# Patient Record
Sex: Male | Born: 1945 | Race: Black or African American | Hispanic: No | Marital: Married | Smoking: Former smoker
Health system: Southern US, Community
[De-identification: ages and names within clinical notes are randomized; demographics above are authoritative.]

## PROBLEM LIST (undated history)

## (undated) DIAGNOSIS — J189 Pneumonia, unspecified organism: Secondary | ICD-10-CM

## (undated) DIAGNOSIS — I719 Aortic aneurysm of unspecified site, without rupture: Secondary | ICD-10-CM

## (undated) DIAGNOSIS — K2971 Gastritis, unspecified, with bleeding: Secondary | ICD-10-CM

## (undated) DIAGNOSIS — I10 Essential (primary) hypertension: Secondary | ICD-10-CM

## (undated) DIAGNOSIS — K2991 Gastroduodenitis, unspecified, with bleeding: Secondary | ICD-10-CM

## (undated) DIAGNOSIS — I2510 Atherosclerotic heart disease of native coronary artery without angina pectoris: Secondary | ICD-10-CM

## (undated) DIAGNOSIS — Z9081 Acquired absence of spleen: Secondary | ICD-10-CM

## (undated) DIAGNOSIS — I4891 Unspecified atrial fibrillation: Secondary | ICD-10-CM

## (undated) DIAGNOSIS — Z8679 Personal history of other diseases of the circulatory system: Secondary | ICD-10-CM

## (undated) DIAGNOSIS — D126 Benign neoplasm of colon, unspecified: Secondary | ICD-10-CM

## (undated) DIAGNOSIS — I82629 Acute embolism and thrombosis of deep veins of unspecified upper extremity: Secondary | ICD-10-CM

## (undated) DIAGNOSIS — Z952 Presence of prosthetic heart valve: Secondary | ICD-10-CM

## (undated) DIAGNOSIS — D62 Acute posthemorrhagic anemia: Secondary | ICD-10-CM

## (undated) DIAGNOSIS — Z9889 Other specified postprocedural states: Secondary | ICD-10-CM

## (undated) HISTORY — PX: SPLENECTOMY: SUR1306

## (undated) HISTORY — DX: Aortic aneurysm of unspecified site, without rupture: I71.9

## (undated) HISTORY — DX: Pneumonia, unspecified organism: J18.9

## (undated) HISTORY — DX: Presence of prosthetic heart valve: Z95.2

## (undated) HISTORY — PX: CARDIAC VALVE REPLACEMENT: SHX585

## (undated) HISTORY — DX: Acquired absence of spleen: Z90.81

## (undated) HISTORY — DX: Essential (primary) hypertension: I10

## (undated) SURGERY — Surgical Case
Anesthesia: *Unknown

---

## 1991-01-27 DIAGNOSIS — I719 Aortic aneurysm of unspecified site, without rupture: Secondary | ICD-10-CM

## 1991-01-27 HISTORY — DX: Aortic aneurysm of unspecified site, without rupture: I71.9

## 1991-01-27 HISTORY — PX: ABDOMINAL AORTIC ANEURYSM REPAIR: SUR1152

## 1991-12-01 ENCOUNTER — Encounter: Payer: Self-pay | Admitting: Cardiology

## 1992-01-27 DIAGNOSIS — Z9081 Acquired absence of spleen: Secondary | ICD-10-CM

## 1992-01-27 HISTORY — DX: Acquired absence of spleen: Z90.81

## 2007-09-13 ENCOUNTER — Encounter: Payer: Self-pay | Admitting: Cardiology

## 2007-09-14 ENCOUNTER — Ambulatory Visit: Payer: Self-pay | Admitting: Cardiology

## 2007-09-14 ENCOUNTER — Encounter: Payer: Self-pay | Admitting: Cardiology

## 2008-03-15 ENCOUNTER — Emergency Department (HOSPITAL_COMMUNITY): Admission: EM | Admit: 2008-03-15 | Discharge: 2008-03-15 | Payer: Self-pay | Admitting: Emergency Medicine

## 2009-06-30 ENCOUNTER — Encounter: Payer: Self-pay | Admitting: Cardiology

## 2009-11-27 ENCOUNTER — Encounter: Payer: Self-pay | Admitting: Cardiology

## 2010-01-13 ENCOUNTER — Encounter: Payer: Self-pay | Admitting: Cardiology

## 2010-01-13 ENCOUNTER — Ambulatory Visit: Payer: Self-pay | Admitting: Cardiology

## 2010-01-13 ENCOUNTER — Encounter (INDEPENDENT_AMBULATORY_CARE_PROVIDER_SITE_OTHER): Payer: Self-pay | Admitting: *Deleted

## 2010-01-13 DIAGNOSIS — R0789 Other chest pain: Secondary | ICD-10-CM

## 2010-01-13 DIAGNOSIS — I712 Thoracic aortic aneurysm, without rupture, unspecified: Secondary | ICD-10-CM | POA: Insufficient documentation

## 2010-01-13 DIAGNOSIS — Z9089 Acquired absence of other organs: Secondary | ICD-10-CM | POA: Insufficient documentation

## 2010-01-13 DIAGNOSIS — I359 Nonrheumatic aortic valve disorder, unspecified: Secondary | ICD-10-CM | POA: Insufficient documentation

## 2010-02-06 ENCOUNTER — Telehealth (INDEPENDENT_AMBULATORY_CARE_PROVIDER_SITE_OTHER): Payer: Self-pay | Admitting: *Deleted

## 2010-02-27 NOTE — Progress Notes (Signed)
Summary: follow up call on patient assistance  Phone Note Outgoing Call   Summary of Call:  Seen on 12/19 - needs to have Echo & Dobutamine Stress Echo, but is self-pay.  Give pt financial assistance application for [employer].  Waiting on pt to bring back to office.   Follow up call placed to patient.  States he brought papers back to office around 12/23.  Advised pt to call office when he receives letter of approval.  Patient verbalized understanding.  Initial call taken by: Hoover Brunette, LPN,  February 06, 2010 3:03 PM

## 2010-02-27 NOTE — Assessment & Plan Note (Signed)
Summary: NP-CARDIOLOGY CONSULT DUE TO S/P   Visit Type:  Initial Consult Primary [name]:  [name]   History of Present Illness: the patient is a 65 year old male with a history of aortic valve replacement and aneurysm repair of the thoracic aorta by Dr. Particia [name] in 1993. Unfortunately have no records available. The patient tells me that he hadpig valve. However physical examination appears in the middle of the mechanical valve. He currently denies any chest pain or shortness of breath. 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  He stable from a cardiovascular standpoint and he denies orthopnea PND palpitations or syncope. The patient has an umbilical hernia which needs her care. He seen for preoperative care. The patient has had no followup since his original surgery many years ago.  Preventive Screening-Counseling & Management  Alcohol-Tobacco     Smoking Status: current     Smoking Cessation Counseling: yes     Packs/Day: <1/3 Pack every 2 weeks  Current Medications (verified): 1)  Amlodipine Besylate 5 Mg Tabs (Amlodipine Besylate) .... Take 1 Tablet By Mouth Once A Day 2)  Benicar Hct 20-12.5 Mg Tabs (Olmesartan Medoxomil-Hctz) .... Take 1 Tablet By Mouth Once A Day 3)  Aspir-Low 81 Mg Tbec (Aspirin) .... Take 1 Tablet By Mouth Once A Day 4)  Lipitor 20 Mg Tabs (Atorvastatin Calcium) .... Take 1 Tablet By Mouth Once A Day 5)  Aspirin 325 Mg Tabs (Aspirin) .... Take 1 Tablet By Mouth Once A Day  Allergies (verified): No Known Drug Allergies  Comments:  Nurse/Medical Assistant: The patient's medication bottles and allergies were reviewed with the patient and were updated in the Medication and Allergy Lists.  Past History:  Past Surgical History: Last updated: 01/13/2010 Aortic aneurysm repair @ [place] 1993 Splenectomy 1994 Heart Valve replacement @ [facility]  Family History: Last updated: 01/13/2010 Father: 57 yrs  Hypertension Mother 22 yrs Diabetes mother deceased Twin brothers are living Health unknown 4 sisters living health unknown  Social History: Last updated: 01/13/2010 Retired  Married  Tobacco Use - Yes.  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    Vital Signs:  Patient profile:   65 year old male Height:      69 inches Weight:      236 pounds BMI:     34.98 Pulse rate:   85 / minute BP sitting:   138 / 84  (left arm) Cuff size:   large  Vitals Entered By: [name] (January 13, 2010 10:30 AM)  Nutrition Counseling: Patient's BMI is greater than 25 and therefore counseled on weight management options.  Physical Exam  Additional Exam:  General: Well-developed, well-nourished in no distress head: Normocephalic and atraumatic eyes PERRLA/EOMI  intact, conjunctiva and lids normal nose: No deformity or lesions mouth normal dentition, normal posterior pharynx neck: Supple, no JVD.  No masses, thyromegaly or abnormal cervical nodes lungs: Normal breath sounds  bilaterally without wheezing.  Normal percussion heart:it sounds the patient actually may have a mechanical valve.with a closing click of S1., no S3 or S4.  PMI is normal.  No pathological murmurs abdomen: Normal bowel sounds, abdomen is soft and nontender without masses, organomegaly or hernias noted.  No hepatosplenomegaly musculoskeletal: Back normal, normal gait muscle strength and tone normal pulsus: Pulse is normal in all 4 extremities Extremities: No peripheral pitting edema neurologic: Alert and oriented x 3 skin: Intact without lesions or rashes cervical nodes: No significant adenopathy psychologic: Normal affect    Impression & Recommendations:  Problem # 1:  AORTIC VALVE DISORDERS (ICD-424.1) the patient status post aortic valve replacement. I suspect he may have a mechanical valve. I'm unable to retrieve the records in the chart. We will try to obtain the original operative report. Nevertheless the patient is an echocardiogram. His updated medication list for this problem includes:    Benicar Hct 20-12.5 Mg Tabs (Olmesartan medoxomil-hctz) .Marland Kitchen... Take 1 tablet by mouth once a day  Orders: 2-D Echocardiogram (2D Echo) Echo- Dobutamine (Dobutamine Echo)  Problem # 2:  PREOPERATIVE EXAMINATION (ICD-V72.84) patient needs a stress echocardiogram prior to umbilical surgery. He has not had an ischemia evaluation many years.  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 Evaluate with echocardiogram.  Other Orders: EKG w/ Interpretation (93000)  Patient Instructions: 1)  2D-Echo 2)  Stress Echo 3)  Follow up in  6 months

## 2010-02-27 NOTE — Letter (Signed)
Summary: Internal Other/ PATIENT HISTORY FORM  Internal Other/ PATIENT HISTORY FORM   Imported By: Dorise Hiss 01/16/2010 15:12:29  _____________________________________________________________________  External Attachment:    Type:   Image     Comment:   External Document

## 2010-02-27 NOTE — Letter (Signed)
Summary: Dobutamine Echocardiogram  [facility] [address]   [place], [place] [address]   Phone: [phone]  Fax: [phone]      [facility] Cardiovascular Services  Dobutamine Echocardiogram    [name]  Appointment Date:_  Appointment Time:_   Your doctor has ordered a Dobutamine echo to help determine the condition of our heart. If you take blood pressure medication, ask your doctor if you should take your medications the day of the procedure. Do not eat and drink 4 hours before the test is scheduled.  You will be asked to undress from the waist up and given a hospital gown to wear, so dress comfortaly from the waist down.  You will need to register at the Outpatient Department at [facility], located at the front enttrance of the hospital. Make sure to bring your insurance information and a form of ID.  Your appointment will last approximately one and one-half hours

## 2010-02-27 NOTE — Op Note (Signed)
Summary: Operative Report/ [facility]  Operative Report/ [place]   Imported By: Dorise Hiss 01/15/2010 09:42:49  _____________________________________________________________________  External Attachment:    Type:   Image     Comment:   External Document

## 2010-02-27 NOTE — Letter (Signed)
Summary: [facility]ence[facility]  [facility]ence[facility]   Imported By: Dorise Hiss 12/11/2009 10:50:51  _____________________________________________________________________  External Attachment:    Type:   Image     Comment:   External Document

## 2010-02-27 NOTE — Letter (Signed)
Summary: External Correspondence/ [facility]ED [facility]  External Correspondence/ FAXED [facility]   Imported By: Dorise Hiss 01/29/2010 10:12:38  _____________________________________________________________________  External Attachment:    Type:   Image     Comment:   External Document

## 2010-07-09 ENCOUNTER — Encounter: Payer: Self-pay | Admitting: Cardiology

## 2010-08-07 ENCOUNTER — Encounter: Payer: Self-pay | Admitting: Cardiology

## 2010-08-13 ENCOUNTER — Ambulatory Visit: Payer: Self-pay | Admitting: Cardiology

## 2012-08-26 HISTORY — PX: ESOPHAGOGASTRODUODENOSCOPY: SHX1529

## 2012-11-30 ENCOUNTER — Encounter (INDEPENDENT_AMBULATORY_CARE_PROVIDER_SITE_OTHER): Payer: Self-pay

## 2012-11-30 ENCOUNTER — Encounter: Payer: Self-pay | Admitting: Gastroenterology

## 2012-11-30 ENCOUNTER — Ambulatory Visit (INDEPENDENT_AMBULATORY_CARE_PROVIDER_SITE_OTHER): Payer: Medicare PPO | Admitting: Gastroenterology

## 2012-11-30 VITALS — BP 133/76 | HR 99 | Temp 97.4°F | Wt 223.8 lb

## 2012-11-30 DIAGNOSIS — Z1211 Encounter for screening for malignant neoplasm of colon: Secondary | ICD-10-CM | POA: Insufficient documentation

## 2012-11-30 DIAGNOSIS — K269 Duodenal ulcer, unspecified as acute or chronic, without hemorrhage or perforation: Secondary | ICD-10-CM

## 2012-11-30 MED ORDER — PEG 3350-KCL-NA BICARB-NACL 420 G PO SOLR: 4000.0000 mL | ORAL | Status: DC

## 2012-11-30 NOTE — Patient Instructions (Signed)
We have set you up for a colonoscopy with Dr. Darrick Penna in the near future.  Continue to take Protonix twice a day through December, then you may resume it once daily. I have sent refills.  Instructions for fatty liver: Recommend 1-2# weight loss per week until ideal body weight through exercise & diet. Low fat/cholesterol diet.   Avoid sweets, sodas, fruit juices, sweetened beverages like tea, etc. Gradually increase exercise from 15 min daily up to 1 hr per day 5 days/week. Limit alcohol use.

## 2012-11-30 NOTE — Progress Notes (Signed)
Primary Care Physician:  Evlyn Courier, MD Primary Gastroenterologist:  Dr. Darrick Penna   Chief Complaint  Patient presents with  . Abnormal Lab    HPI:   [name] presents today at the request of Dr. Loleta Chance to establish care. He was actually hospitalized at [facility] in Aug 2014 with an upper GI bleed secondary to a large duodenal ulcer with visible vessel, s/p clip placement and epi. +H.pylori serology, treated with Biaxin and Amoxicillin. No melena, hematochezia, finished triple therapy for +H.pylori serology. No abdominal pain. No constipation, diarrhea. Urinary urgency at times. No prior colonoscopy. No upper GI symptoms. NO LONGER TAKING NSAIDS.   Appears discharge Hgb from [facility] was 7.6 in early Sept 2014. Outside labs from Dr. Loleta Chance reveal Hgb 7.4 in mid October. Unknown baseline, iron, ferritin levels.   Past Medical History  Diagnosis Date  . Chest pain   . Hypertension   . Aortic aneurysm 1993  . 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 Pneumonia   . Aortic valve replaced      At [facility]    Past Surgical History  Procedure Laterality Date  . Abdominal aortic aneurysm repair  1993    At [facility], Unspecified  . Splenectomy    . Cardiac valve replacement      At [facility]  . Esophagogastroduodenoscopy  Aug 2014    [facility]: large duodenal ulcer with visible vessel, s/p clip and epi, erosive gastritis. +h.pylori serology, treated with amoxicillin and biaxin.     Current Outpatient Prescriptions  Medication Sig Dispense Refill  . folic acid (FOLVITE) 1 MG tablet Take 1 mg by mouth daily.      . pantoprazole (PROTONIX) 40 MG tablet Take 40 mg by mouth 2 (two) times daily.        No current facility-administered medications for this visit.    Allergies as of 11/30/2012  . (No Known Allergies)    Family History  Problem Relation Age of Onset  . Hypertension Father 22  . Diabetes Mother 37  . Colon cancer Neg Hx     History    Social History  . Marital Status: Married    Spouse Name: N/A    Number of Children: 3  . Years of Education: N/A   Occupational History  . Retired    Social History Main Topics  . Smoking status: Former Games developer  . Smokeless tobacco: Not on file  . Alcohol Use: No  . Drug Use: No  . Sexual Activity: Not on file   Other Topics Concern  . Not on file   Social History Narrative   Patient has two daughters and one son in good health    Review of Systems: As mentioned in HPI.   Physical Exam: BP 133/76  Pulse 99  Temp(Src) 97.4 F (36.3 C) (Oral)  Wt 223 lb 12.8 oz (101.515 kg) General:   Alert and oriented. Pleasant and cooperative. Well-nourished and well-developed.  Head:  Normocephalic and atraumatic. Eyes:  Without icterus, sclera clear and conjunctiva pink.  Ears:  Normal auditory acuity. Nose:  No deformity, discharge,  or lesions. Mouth:  No deformity or lesions, oral mucosa pink.  Neck:  Supple, without mass or thyromegaly. Lungs:  Clear to auscultation bilaterally. No wheezes, rales, or rhonchi. No distress.  Heart:  S1, S2 present with murmur consistent with prior aortic valve placement Abdomen:  +BS, soft, non-tender and non-distended. Large ventral hernia appreciated, no significant HSM appreciated Rectal:  Deferred  Msk:  Symmetrical without gross deformities. Normal posture. Extremities:  Without clubbing or edema. Neurologic:  Alert and  oriented x4;  grossly normal neurologically. Skin:  Intact without significant lesions or rashes. Cervical Nodes:  No significant cervical adenopathy. Psych:  Alert and cooperative. Normal mood and affect.  [place] of abdomen Aug 2014 at [facility]:  Fatty liver

## 2012-12-01 NOTE — Assessment & Plan Note (Signed)
In the setting of NSAIDs, requiring bleeding control therapy at [facility]. Continue Protonix BID for a complete 3 months, then back to once daily indefinitely. H.pylori serologies have been positive and treated with triple therapy. No surveillance indicated unless worsening anemia or evidence of melena.  Obtain CBC, iron, ferritin now.

## 2012-12-01 NOTE — Assessment & Plan Note (Signed)
67 year old male with no prior colonoscopy, overdue for initial screening. No rectal bleeding, change in bowel habits, or constipation noted. Aug 2014 hospitalized for bleeding duodenal ulcer at [facility]; +h.pylori serologies treated with triple therapy. NO LONGER ON NSAIDS. Patient doing well currently. Unknown baseline Hgb, but most recent Hgb 7.4 mid October.   Obtain updated CBC, iron, ferritin Proceed with colonoscopy with Dr. Darrick Penna in the near future. The risks, benefits, and alternatives have been discussed in detail with the patient. They state understanding and desire to proceed.

## 2012-12-05 NOTE — Progress Notes (Signed)
cc'd to pcp 

## 2012-12-07 NOTE — Progress Notes (Signed)
Need outside labs from PCP to include LFTs.   Patient needs to get updated CBC, iron, and ferritin. Thanks!

## 2012-12-08 LAB — CBC: HGB: 7.4 g/dL

## 2012-12-09 ENCOUNTER — Other Ambulatory Visit: Payer: Self-pay

## 2012-12-09 DIAGNOSIS — D582 Other hemoglobinopathies: Secondary | ICD-10-CM

## 2012-12-13 NOTE — Progress Notes (Signed)
Called and informed pt. He is aware lab orders have been faxed to [facility]. Routing to SS to get labs from PCP.

## 2012-12-14 ENCOUNTER — Encounter (HOSPITAL_COMMUNITY): Payer: Self-pay | Admitting: Pharmacy Technician

## 2012-12-14 NOTE — Progress Notes (Signed)
Requested labs including LFTS from Dr Loleta Chance

## 2012-12-16 LAB — CBC WITH DIFFERENTIAL/PLATELET
Basophils Absolute: 0 10*3/uL (ref 0.0–0.1)
Basophils Relative: 1 % (ref 0–1)
Eosinophils Absolute: 0.2 10*3/uL (ref 0.0–0.7)
Eosinophils Relative: 3 % (ref 0–5)
HCT: 27.6 % — ABNORMAL LOW (ref 39.0–52.0)
Lymphocytes Relative: 27 % (ref 12–46)
Lymphs Abs: 1.7 10*3/uL (ref 0.7–4.0)
MCH: 22.2 pg — ABNORMAL LOW (ref 26.0–34.0)
MCHC: 30.1 g/dL (ref 30.0–36.0)
MCV: 73.8 fL — ABNORMAL LOW (ref 78.0–100.0)
Monocytes Absolute: 0.9 10*3/uL (ref 0.1–1.0)
Neutrophils Relative %: 54 % (ref 43–77)
RBC: 3.74 MIL/uL — ABNORMAL LOW (ref 4.22–5.81)
RDW: 20 % — ABNORMAL HIGH (ref 11.5–15.5)

## 2012-12-16 LAB — IRON: Iron: 13 ug/dL — ABNORMAL LOW (ref 42–165)

## 2012-12-26 DIAGNOSIS — D126 Benign neoplasm of colon, unspecified: Secondary | ICD-10-CM

## 2012-12-26 HISTORY — DX: Benign neoplasm of colon, unspecified: D12.6

## 2012-12-27 ENCOUNTER — Encounter (HOSPITAL_COMMUNITY)
Admission: RE | Disposition: A | Discharge status: Home / Self Care | Payer: Self-pay | Source: Ambulatory Visit | Attending: Gastroenterology

## 2012-12-27 ENCOUNTER — Ambulatory Visit (HOSPITAL_COMMUNITY)
Admission: RE | Admit: 2012-12-27 | Discharge: 2012-12-27 | Disposition: A | Discharge status: Home / Self Care | Payer: Medicare PPO | Source: Ambulatory Visit | Attending: Gastroenterology | Admitting: Gastroenterology

## 2012-12-27 ENCOUNTER — Encounter (HOSPITAL_COMMUNITY): Payer: Self-pay | Admitting: *Deleted

## 2012-12-27 DIAGNOSIS — K644 Residual hemorrhoidal skin tags: Secondary | ICD-10-CM

## 2012-12-27 DIAGNOSIS — I10 Essential (primary) hypertension: Secondary | ICD-10-CM | POA: Insufficient documentation

## 2012-12-27 DIAGNOSIS — D126 Benign neoplasm of colon, unspecified: Secondary | ICD-10-CM

## 2012-12-27 DIAGNOSIS — Z1211 Encounter for screening for malignant neoplasm of colon: Secondary | ICD-10-CM

## 2012-12-27 DIAGNOSIS — K5730 Diverticulosis of large intestine without perforation or abscess without bleeding: Secondary | ICD-10-CM | POA: Insufficient documentation

## 2012-12-27 HISTORY — PX: COLONOSCOPY: SHX5424

## 2012-12-27 SURGERY — COLONOSCOPY
Anesthesia: Moderate Sedation

## 2012-12-27 MED ORDER — STERILE WATER FOR IRRIGATION IR SOLN
Status: DC | PRN
  Administered 2012-12-27: 08:00:00

## 2012-12-27 MED ORDER — SODIUM CHLORIDE 0.9 % IV SOLN
INTRAVENOUS | Status: DC
  Administered 2012-12-27: 08:00:00 via INTRAVENOUS

## 2012-12-27 MED ORDER — MIDAZOLAM HCL 5 MG/5ML IJ SOLN
INTRAMUSCULAR | Status: DC | PRN
  Administered 2012-12-27 (×2): 2 mg via INTRAVENOUS
  Administered 2012-12-27: 1 mg via INTRAVENOUS

## 2012-12-27 MED ORDER — MIDAZOLAM HCL 5 MG/5ML IJ SOLN
INTRAMUSCULAR | Status: AC
  Filled 2012-12-27: qty 10

## 2012-12-27 MED ORDER — MEPERIDINE HCL 100 MG/ML IJ SOLN
INTRAMUSCULAR | Status: AC
  Filled 2012-12-27: qty 2

## 2012-12-27 MED ORDER — MEPERIDINE HCL 100 MG/ML IJ SOLN
INTRAMUSCULAR | Status: DC | PRN
  Administered 2012-12-27 (×2): 25 mg via INTRAVENOUS

## 2012-12-27 NOTE — Op Note (Signed)
[facility] [address] [place], [address]   COLONOSCOPY PROCEDURE REPORT  PATIENT: Cody, [name]  MR#: [id number] BIRTHDATE: [birth year] , 67  yrs. old GENDER: Male ENDOSCOPIST: Cody Eva, MD REFERRED QI:ONGEXB [name], [name] PROCEDURE DATE:  12/27/2012 PROCEDURE:   Colonoscopy with snare polypectomy INDICATIONS:Average risk patient for colon cancer. MEDICATIONS: Demerol 50 mg IV and Versed 5 mg IV  DESCRIPTION OF PROCEDURE:    Physical exam was performed.  Informed consent was obtained from the patient after explaining the benefits, risks, and alternatives to procedure.  The patient was connected to monitor and placed in left lateral position. Continuous oxygen was provided by nasal cannula and IV medicine administered through an indwelling cannula.  After administration of sedation and rectal exam, the patients rectum was intubated and the EC-[id number] ([id number])  colonoscope was advanced under direct visualization to the ileum.  The scope was removed slowly by carefully examining the color, texture, anatomy, and integrity mucosa on the way out.  The patient was recovered in endoscopy and discharged home in satisfactory condition.    COLON FINDINGS: The mucosa appeared normal in the terminal ileum.  , A sessile polyp measuring 8 mm in size was found at the cecum.  A polypectomy was performed using snare cautery.  , The DC/[place] colon ARE redundant.  Manual abdominal counter-pressure was used to reach the cecum.  The patient was moved on to their back to reach the cecum, There was mild diverticulosis noted in the descending colon and sigmoid colon with associated tortuosity.  , and Moderate sized external hemorrhoids were found.  PREP QUALITY: good.  CECAL W/D TIME: 15 minutes     COMPLICATIONS: None  ENDOSCOPIC IMPRESSION: 1.   ONE COLONPOLYP REMOVED 2.   Mild diverticulosis in the descending colon and sigmoid colon 3.   The colon IS redundant 4.    Moderate sized external hemorrhoids  RECOMMENDATIONS: FOLLOW A HIGH FIBER DIET.  AVOID ITEMS THAT CAUSE BLOATING. BIOPSY RESULTS SHOULD BE BACK IN 7 DAYS. Next colonoscopy WITH AN OVERTUBE in 3 YEARS IF SERRATED OR ADVANCED ADENOMA AND 5-10 YEARS IF A SIMPLE ADENOMA.       _______________________________ Rosalie DoctorJonette Eva, MD 12/27/2012 9:20 AM

## 2012-12-27 NOTE — H&P (Signed)
  Primary Care Physician:  Evlyn Courier, MD Primary Gastroenterologist:  Dr. Darrick Penna  Pre-Procedure History & Physical: HPI:  [name] is a 67 y.o. male here for COLON CANCER SCREENING.  Past Medical History  Diagnosis Date  . Chest pain   . Hypertension   . Aortic aneurysm 1993  . 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 Pneumonia   . Aortic valve replaced      At [facility]    Past Surgical History  Procedure Laterality Date  . Abdominal aortic aneurysm repair  1993    At [facility], Unspecified  . Splenectomy    . Cardiac valve replacement      At [facility]  . Esophagogastroduodenoscopy  Aug 2014    [facility]: large duodenal ulcer with visible vessel, s/p clip and epi, erosive gastritis. +h.pylori serology, treated with amoxicillin and biaxin.     Prior to Admission medications   Medication Sig Start Date End Date Taking? Authorizing Provider  folic acid (FOLVITE) 1 MG tablet Take 1 mg by mouth daily.   Yes Historical Provider, MD  olmesartan-hydrochlorothiazide (BENICAR HCT) 20-12.5 MG per tablet Take 1 tablet by mouth daily.   Yes Historical Provider, MD    Allergies as of 11/30/2012  . (No Known Allergies)    Family History  Problem Relation Age of Onset  . Hypertension Father 26  . Diabetes Mother 40  . Colon cancer Neg Hx     History   Social History  . Marital Status: Married    Spouse Name: N/A    Number of Children: 3  . Years of Education: N/A   Occupational History  . Retired    Social History Main Topics  . Smoking status: Former Games developer  . Smokeless tobacco: Not on file  . Alcohol Use: No  . Drug Use: No  . Sexual Activity: Not on file   Other Topics Concern  . Not on file   Social History Narrative   Patient has two daughters and one son in good health    Review of Systems: See HPI, otherwise negative ROS   Physical Exam: BP 138/80  Pulse 81  Temp(Src) 98.1 F (36.7 C) (Oral)  Resp 18  Ht 5\' 9"   (1.753 m)  Wt 223 lb (101.152 kg)  BMI 32.92 kg/m2  SpO2 98% General:   Alert,  pleasant and cooperative in NAD Head:  Normocephalic and atraumatic. Neck:  Supple; Lungs:  Clear throughout to auscultation.    Heart:  Regular rate and rhythm. Abdomen:  Soft, nontender and nondistended. Normal bowel sounds, without guarding, and without rebound.   Neurologic:  Alert and  oriented x4;  grossly normal neurologically.  Impression/Plan:     SCREENING  Plan:  1. TCS TODAY

## 2012-12-28 NOTE — Progress Notes (Signed)
Quick Note:  Hgb improved but still low. Likely due to known PUD.  Start iron 325 mg po BID. Can get this over the counter.  Recheck CBC, iron, and ferritin in 3 months ______

## 2012-12-30 ENCOUNTER — Encounter (HOSPITAL_COMMUNITY): Payer: Self-pay | Admitting: Gastroenterology

## 2013-01-02 ENCOUNTER — Other Ambulatory Visit: Payer: Self-pay

## 2013-01-02 DIAGNOSIS — R7989 Other specified abnormal findings of blood chemistry: Secondary | ICD-10-CM

## 2013-01-02 DIAGNOSIS — R790 Abnormal level of blood mineral: Secondary | ICD-10-CM

## 2013-01-02 NOTE — Progress Notes (Signed)
Quick Note:  Called and left message for a return call. Lab order on file for 03/2013. ______

## 2013-01-03 ENCOUNTER — Telehealth: Payer: Self-pay | Admitting: Gastroenterology

## 2013-01-03 NOTE — Telephone Encounter (Signed)
Called. Many rings and no answer.  

## 2013-01-03 NOTE — Progress Notes (Signed)
Quick Note:  LM with someone at home for a return call. ______

## 2013-01-03 NOTE — Telephone Encounter (Signed)
Left message with someone for a return call.  

## 2013-01-03 NOTE — Telephone Encounter (Signed)
Please call pt. He had A simple adenoma removed from hIS colon. FOLLOW A High fiber diet. TCS IN 5 YEARS.  

## 2013-01-04 NOTE — Telephone Encounter (Signed)
Pt called and was informed of results.  

## 2013-01-04 NOTE — Progress Notes (Signed)
Quick Note:  PT returned call and was informed. Lab orders on file. ______

## 2013-03-16 ENCOUNTER — Other Ambulatory Visit: Payer: Self-pay

## 2013-03-16 DIAGNOSIS — R7989 Other specified abnormal findings of blood chemistry: Secondary | ICD-10-CM

## 2013-03-16 DIAGNOSIS — R790 Abnormal level of blood mineral: Secondary | ICD-10-CM

## 2013-03-27 LAB — CBC WITH DIFFERENTIAL/PLATELET
Basophils Absolute: 0.1 10*3/uL (ref 0.0–0.1)
Basophils Relative: 1 % (ref 0–1)
EOS PCT: 3 % (ref 0–5)
Eosinophils Absolute: 0.2 10*3/uL (ref 0.0–0.7)
HCT: 40.8 % (ref 39.0–52.0)
HEMOGLOBIN: 13.1 g/dL (ref 13.0–17.0)
LYMPHS ABS: 1.4 10*3/uL (ref 0.7–4.0)
LYMPHS PCT: 23 % (ref 12–46)
MCH: 28.5 pg (ref 26.0–34.0)
MCHC: 32.1 g/dL (ref 30.0–36.0)
MCV: 88.7 fL (ref 78.0–100.0)
MONOS PCT: 15 % — AB (ref 3–12)
Monocytes Absolute: 0.9 10*3/uL (ref 0.1–1.0)
Neutro Abs: 3.5 10*3/uL (ref 1.7–7.7)
Neutrophils Relative %: 58 % (ref 43–77)
Platelets: 224 10*3/uL (ref 150–400)
RBC: 4.6 MIL/uL (ref 4.22–5.81)
RDW: 18.2 % — ABNORMAL HIGH (ref 11.5–15.5)
WBC: 6.1 10*3/uL (ref 4.0–10.5)

## 2013-03-27 LAB — IRON: IRON: 92 ug/dL (ref 42–165)

## 2013-03-28 LAB — FERRITIN: Ferritin: 36 ng/mL (ref 22–322)

## 2013-04-17 NOTE — Progress Notes (Signed)
Quick Note:  Hgb now normal. Iron, ferritin improved and normal. Continue iron just once daily.  Recheck CBC, iron, ferritin in 6 months. ______

## 2013-04-18 ENCOUNTER — Other Ambulatory Visit: Payer: Self-pay

## 2013-04-18 DIAGNOSIS — R790 Abnormal level of blood mineral: Secondary | ICD-10-CM

## 2013-04-18 NOTE — Progress Notes (Signed)
Quick Note:  Pt is aware and lab orders on file for 10/19/2013. ______

## 2013-09-07 ENCOUNTER — Other Ambulatory Visit: Payer: Self-pay

## 2013-09-07 DIAGNOSIS — R790 Abnormal level of blood mineral: Secondary | ICD-10-CM

## 2014-05-27 DIAGNOSIS — K2971 Gastritis, unspecified, with bleeding: Secondary | ICD-10-CM

## 2014-05-27 DIAGNOSIS — I82629 Acute embolism and thrombosis of deep veins of unspecified upper extremity: Secondary | ICD-10-CM

## 2014-05-27 DIAGNOSIS — I4891 Unspecified atrial fibrillation: Secondary | ICD-10-CM

## 2014-05-27 DIAGNOSIS — D62 Acute posthemorrhagic anemia: Secondary | ICD-10-CM

## 2014-05-27 HISTORY — DX: Gastritis, unspecified, with bleeding: K29.71

## 2014-05-27 HISTORY — DX: Unspecified atrial fibrillation: I48.91

## 2014-05-27 HISTORY — DX: Acute embolism and thrombosis of deep veins of unspecified upper extremity: I82.629

## 2014-05-27 HISTORY — DX: Acute posthemorrhagic anemia: D62

## 2014-06-14 ENCOUNTER — Inpatient Hospital Stay (HOSPITAL_COMMUNITY): Payer: Commercial Managed Care - HMO

## 2014-06-14 ENCOUNTER — Inpatient Hospital Stay (HOSPITAL_COMMUNITY)
Admission: RE | Admit: 2014-06-14 | Discharge: 2014-06-29 | DRG: 981 | Disposition: A | Discharge status: Rehabilitation Facility | Payer: Commercial Managed Care - HMO | Source: Other Acute Inpatient Hospital | Attending: Cardiology | Admitting: Cardiology

## 2014-06-14 ENCOUNTER — Encounter (HOSPITAL_COMMUNITY): Payer: Self-pay | Admitting: Cardiology

## 2014-06-14 DIAGNOSIS — I482 Chronic atrial fibrillation: Secondary | ICD-10-CM | POA: Diagnosis present

## 2014-06-14 DIAGNOSIS — T82867A Thrombosis of cardiac prosthetic devices, implants and grafts, initial encounter: Secondary | ICD-10-CM | POA: Diagnosis present

## 2014-06-14 DIAGNOSIS — Z9081 Acquired absence of spleen: Secondary | ICD-10-CM | POA: Diagnosis present

## 2014-06-14 DIAGNOSIS — R7989 Other specified abnormal findings of blood chemistry: Secondary | ICD-10-CM | POA: Diagnosis present

## 2014-06-14 DIAGNOSIS — M7989 Other specified soft tissue disorders: Secondary | ICD-10-CM | POA: Diagnosis not present

## 2014-06-14 DIAGNOSIS — R570 Cardiogenic shock: Secondary | ICD-10-CM | POA: Diagnosis present

## 2014-06-14 DIAGNOSIS — R0602 Shortness of breath: Secondary | ICD-10-CM | POA: Diagnosis present

## 2014-06-14 DIAGNOSIS — I82612 Acute embolism and thrombosis of superficial veins of left upper extremity: Secondary | ICD-10-CM | POA: Diagnosis not present

## 2014-06-14 DIAGNOSIS — I4891 Unspecified atrial fibrillation: Secondary | ICD-10-CM | POA: Diagnosis not present

## 2014-06-14 DIAGNOSIS — I350 Nonrheumatic aortic (valve) stenosis: Secondary | ICD-10-CM | POA: Diagnosis not present

## 2014-06-14 DIAGNOSIS — Y838 Other surgical procedures as the cause of abnormal reaction of the patient, or of later complication, without mention of misadventure at the time of the procedure: Secondary | ICD-10-CM | POA: Diagnosis present

## 2014-06-14 DIAGNOSIS — K259 Gastric ulcer, unspecified as acute or chronic, without hemorrhage or perforation: Secondary | ICD-10-CM | POA: Insufficient documentation

## 2014-06-14 DIAGNOSIS — J9600 Acute respiratory failure, unspecified whether with hypoxia or hypercapnia: Secondary | ICD-10-CM | POA: Diagnosis not present

## 2014-06-14 DIAGNOSIS — K2961 Other gastritis with bleeding: Secondary | ICD-10-CM | POA: Diagnosis not present

## 2014-06-14 DIAGNOSIS — R791 Abnormal coagulation profile: Secondary | ICD-10-CM

## 2014-06-14 DIAGNOSIS — I351 Nonrheumatic aortic (valve) insufficiency: Secondary | ICD-10-CM | POA: Diagnosis present

## 2014-06-14 DIAGNOSIS — E876 Hypokalemia: Secondary | ICD-10-CM | POA: Diagnosis not present

## 2014-06-14 DIAGNOSIS — Z87891 Personal history of nicotine dependence: Secondary | ICD-10-CM

## 2014-06-14 DIAGNOSIS — I5033 Acute on chronic diastolic (congestive) heart failure: Secondary | ICD-10-CM | POA: Diagnosis present

## 2014-06-14 DIAGNOSIS — R4182 Altered mental status, unspecified: Secondary | ICD-10-CM | POA: Diagnosis not present

## 2014-06-14 DIAGNOSIS — K2980 Duodenitis without bleeding: Secondary | ICD-10-CM | POA: Diagnosis present

## 2014-06-14 DIAGNOSIS — D62 Acute posthemorrhagic anemia: Secondary | ICD-10-CM | POA: Diagnosis present

## 2014-06-14 DIAGNOSIS — D696 Thrombocytopenia, unspecified: Secondary | ICD-10-CM | POA: Diagnosis not present

## 2014-06-14 DIAGNOSIS — N179 Acute kidney failure, unspecified: Secondary | ICD-10-CM | POA: Diagnosis not present

## 2014-06-14 DIAGNOSIS — I82622 Acute embolism and thrombosis of deep veins of left upper extremity: Secondary | ICD-10-CM | POA: Diagnosis not present

## 2014-06-14 DIAGNOSIS — Z8679 Personal history of other diseases of the circulatory system: Secondary | ICD-10-CM

## 2014-06-14 DIAGNOSIS — I481 Persistent atrial fibrillation: Secondary | ICD-10-CM | POA: Diagnosis not present

## 2014-06-14 DIAGNOSIS — E871 Hypo-osmolality and hyponatremia: Secondary | ICD-10-CM | POA: Diagnosis present

## 2014-06-14 DIAGNOSIS — K921 Melena: Secondary | ICD-10-CM | POA: Insufficient documentation

## 2014-06-14 DIAGNOSIS — K047 Periapical abscess without sinus: Secondary | ICD-10-CM | POA: Diagnosis present

## 2014-06-14 DIAGNOSIS — I10 Essential (primary) hypertension: Secondary | ICD-10-CM | POA: Insufficient documentation

## 2014-06-14 DIAGNOSIS — M1000 Idiopathic gout, unspecified site: Secondary | ICD-10-CM | POA: Insufficient documentation

## 2014-06-14 DIAGNOSIS — Q213 Tetralogy of Fallot: Secondary | ICD-10-CM | POA: Diagnosis not present

## 2014-06-14 DIAGNOSIS — I509 Heart failure, unspecified: Secondary | ICD-10-CM | POA: Diagnosis not present

## 2014-06-14 DIAGNOSIS — K254 Chronic or unspecified gastric ulcer with hemorrhage: Secondary | ICD-10-CM | POA: Insufficient documentation

## 2014-06-14 DIAGNOSIS — Z9889 Other specified postprocedural states: Secondary | ICD-10-CM

## 2014-06-14 DIAGNOSIS — Z9114 Patient's other noncompliance with medication regimen: Secondary | ICD-10-CM | POA: Diagnosis present

## 2014-06-14 DIAGNOSIS — K253 Acute gastric ulcer without hemorrhage or perforation: Secondary | ICD-10-CM | POA: Diagnosis not present

## 2014-06-14 DIAGNOSIS — G7281 Critical illness myopathy: Secondary | ICD-10-CM | POA: Diagnosis not present

## 2014-06-14 DIAGNOSIS — Z452 Encounter for adjustment and management of vascular access device: Secondary | ICD-10-CM

## 2014-06-14 DIAGNOSIS — R531 Weakness: Secondary | ICD-10-CM | POA: Diagnosis not present

## 2014-06-14 DIAGNOSIS — R5381 Other malaise: Secondary | ICD-10-CM | POA: Diagnosis not present

## 2014-06-14 DIAGNOSIS — I2510 Atherosclerotic heart disease of native coronary artery without angina pectoris: Secondary | ICD-10-CM | POA: Diagnosis not present

## 2014-06-14 DIAGNOSIS — I69319 Unspecified symptoms and signs involving cognitive functions following cerebral infarction: Secondary | ICD-10-CM

## 2014-06-14 DIAGNOSIS — R778 Other specified abnormalities of plasma proteins: Secondary | ICD-10-CM | POA: Diagnosis present

## 2014-06-14 DIAGNOSIS — I480 Paroxysmal atrial fibrillation: Secondary | ICD-10-CM | POA: Diagnosis not present

## 2014-06-14 DIAGNOSIS — Z9289 Personal history of other medical treatment: Secondary | ICD-10-CM

## 2014-06-14 HISTORY — DX: Atherosclerotic heart disease of native coronary artery without angina pectoris: I25.10

## 2014-06-14 HISTORY — DX: Acute posthemorrhagic anemia: D62

## 2014-06-14 HISTORY — DX: Other specified postprocedural states: Z98.890

## 2014-06-14 HISTORY — DX: Gastritis, unspecified, with bleeding: K29.71

## 2014-06-14 HISTORY — DX: Acute embolism and thrombosis of deep veins of unspecified upper extremity: I82.629

## 2014-06-14 HISTORY — DX: Unspecified atrial fibrillation: I48.91

## 2014-06-14 HISTORY — DX: Gastroduodenitis, unspecified, with bleeding: K29.91

## 2014-06-14 HISTORY — DX: Personal history of other diseases of the circulatory system: Z86.79

## 2014-06-14 HISTORY — DX: Benign neoplasm of colon, unspecified: D12.6

## 2014-06-14 LAB — CBC
HCT: 34.8 % — ABNORMAL LOW (ref 39.0–52.0)
HEMOGLOBIN: 12.3 g/dL — AB (ref 13.0–17.0)
MCH: 33.2 pg (ref 26.0–34.0)
MCHC: 35.3 g/dL (ref 30.0–36.0)
MCV: 93.8 fL (ref 78.0–100.0)
PLATELETS: 122 10*3/uL — AB (ref 150–400)
RBC: 3.71 MIL/uL — AB (ref 4.22–5.81)
RDW: 14.1 % (ref 11.5–15.5)
WBC: 9 10*3/uL (ref 4.0–10.5)

## 2014-06-14 LAB — TROPONIN I: TROPONIN I: 1.28 ng/mL — AB (ref <0.031)

## 2014-06-14 LAB — BASIC METABOLIC PANEL
ANION GAP: 11 (ref 5–15)
BUN: 25 mg/dL — ABNORMAL HIGH (ref 6–20)
CO2: 25 mmol/L (ref 22–32)
Calcium: 9.4 mg/dL (ref 8.9–10.3)
Chloride: 93 mmol/L — ABNORMAL LOW (ref 101–111)
Creatinine, Ser: 1.6 mg/dL — ABNORMAL HIGH (ref 0.61–1.24)
GFR, EST AFRICAN AMERICAN: 50 mL/min — AB (ref >60)
GFR, EST NON AFRICAN AMERICAN: 43 mL/min — AB (ref >60)
Glucose, Bld: 105 mg/dL — ABNORMAL HIGH (ref 65–99)
POTASSIUM: 4.4 mmol/L (ref 3.5–5.1)
SODIUM: 129 mmol/L — AB (ref 135–145)

## 2014-06-14 LAB — LACTIC ACID, PLASMA: Lactic Acid, Venous: 2.3 mmol/L (ref 0.5–2.0)

## 2014-06-14 LAB — BRAIN NATRIURETIC PEPTIDE: B Natriuretic Peptide: 1438.6 pg/mL — ABNORMAL HIGH (ref 0.0–100.0)

## 2014-06-14 LAB — RAPID URINE DRUG SCREEN, HOSP PERFORMED
Amphetamines: NOT DETECTED
BARBITURATES: NOT DETECTED
Benzodiazepines: POSITIVE — AB
Cocaine: NOT DETECTED
OPIATES: NOT DETECTED
TETRAHYDROCANNABINOL: NOT DETECTED

## 2014-06-14 LAB — MRSA PCR SCREENING: MRSA BY PCR: NEGATIVE

## 2014-06-14 MED ORDER — PERFLUTREN LIPID MICROSPHERE
INTRAVENOUS | Status: AC
Start: 2014-06-14 — End: 2014-06-15
  Filled 2014-06-14: qty 10

## 2014-06-14 MED ORDER — DOPAMINE-DEXTROSE 3.2-5 MG/ML-% IV SOLN
3.0000 ug/kg/min | INTRAVENOUS | Status: DC
  Administered 2014-06-14: 3 ug/kg/min via INTRAVENOUS

## 2014-06-14 MED ORDER — PNEUMOCOCCAL VAC POLYVALENT 25 MCG/0.5ML IJ INJ
0.5000 mL | INJECTION | INTRAMUSCULAR | Status: DC
  Filled 2014-06-14: qty 0.5

## 2014-06-14 MED ORDER — HEPARIN SODIUM (PORCINE) 5000 UNIT/ML IJ SOLN: 5000.0000 [IU] | Freq: Three times a day (TID) | INTRAMUSCULAR | Status: DC

## 2014-06-14 MED ORDER — HEPARIN BOLUS VIA INFUSION
2500.0000 [IU] | Freq: Once | INTRAVENOUS | Status: AC
  Administered 2014-06-14: 2500 [IU] via INTRAVENOUS
  Filled 2014-06-14: qty 2500

## 2014-06-14 MED ORDER — SODIUM CHLORIDE 0.9 % IV SOLN: 250.0000 mL | INTRAVENOUS | Status: DC | PRN

## 2014-06-14 MED ORDER — PERFLUTREN LIPID MICROSPHERE
1.0000 mL | INTRAVENOUS | Status: AC | PRN
  Administered 2014-06-14: 1 mL via INTRAVENOUS
  Filled 2014-06-14: qty 10

## 2014-06-14 MED ORDER — DOPAMINE-DEXTROSE 3.2-5 MG/ML-% IV SOLN
3.0000 ug/kg/min | INTRAVENOUS | Status: DC
  Administered 2014-06-14: 2 ug/kg/min via INTRAVENOUS

## 2014-06-14 MED ORDER — HEPARIN (PORCINE) IN NACL 100-0.45 UNIT/ML-% IJ SOLN
1050.0000 [IU]/h | INTRAMUSCULAR | Status: DC
  Administered 2014-06-14: 1500 [IU]/h via INTRAVENOUS
  Administered 2014-06-15: 1300 [IU]/h via INTRAVENOUS
  Administered 2014-06-16: 1050 [IU]/h via INTRAVENOUS
  Filled 2014-06-14 (×5): qty 250

## 2014-06-14 NOTE — Progress Notes (Signed)
CRITICAL VALUE ALERT  Critical value received:  Lactic acid 2.3  Date of notification:  06/14/2014  Time of notification:  2215  Critical value read back:Yes.    Nurse who received alert:  Lesli Albee, RN  MD notified (1st page):  Eula Fried  Time of first page:  2217

## 2014-06-14 NOTE — H&P (Signed)
PULMONARY / CRITICAL CARE MEDICINE   Name: [name] MRN: [id number] DOB: 04-14-1945    ADMISSION DATE:  06/14/2014 CONSULTATION DATE:  06/14/2014  REFERRING MD :  Outside hospital  CHIEF COMPLAINT:  Shortness of breath  INITIAL PRESENTATION: 69 year old male with a past medical history significant for aortic valve repair in the setting of an aortic aneurysm in the 1990s by Dr. [name] at [facility] came to [facility] on 06/13/2014 complaining of shortness of breath. He was found to have severe aortic valve insufficiency and so he was transferred to [facility] for further evaluation.  STUDIES:    SIGNIFICANT EVENTS:    HISTORY OF PRESENT ILLNESS:  69 year old male with a past medical history significant for aortic valve repair in the setting of an aortic aneurysm in the 1990s by Dr. [name] at [facility] came to [facility] on 06/13/2014 complaining of shortness of breath. He was found to have severe aortic valve insufficiency and so he was transferred to [facility] for further evaluation. He states that the shortness of breath came on fairly insidiously and was associated with a dry cough. He denied chest pain or leg swelling or fevers. He denied cough productive of purulent sputum. He had a transesophageal echocardiogram which showed evidence of a preserved LVEF but apparently severe aortic regurgitation. Dopamine was used at the outside hospital presumably for low blood pressure. His creatinine was found to be 1.4. The patient states that he has not been a doctor in many years and takes "a blood pressure medication". When asked about whether or not he takes blood thinners for his aortic valve he said "I used to". Because of the use of dopamine and the elevated creatinine pulmonary and critical care medicine was asked to admit the patient.  PAST MEDICAL HISTORY :   has a past medical history of Chest pain; Hypertension;  Aortic aneurysm (1993); History of splenectomy (1994); Pneumonia; and Aortic valve replaced ( ).  has past surgical history that includes Abdominal aortic aneurysm repair (1993); Splenectomy; Cardiac valve replacement; Esophagogastroduodenoscopy (Aug 2014); and Colonoscopy (N/A, 12/27/2012). Prior to Admission medications   Medication Sig Start Date End Date Taking? Authorizing Provider  folic acid (FOLVITE) 1 MG tablet Take 1 mg by mouth daily.    Historical Provider, MD  olmesartan-hydrochlorothiazide (BENICAR HCT) 20-12.5 MG per tablet Take 1 tablet by mouth daily.    Historical Provider, MD   No Known Allergies  FAMILY HISTORY:  indicated that his mother is deceased.  SOCIAL HISTORY:  reports that he has quit smoking. He does not have any smokeless tobacco history on file. He reports that he does not drink alcohol or use illicit drugs.  REVIEW OF SYSTEMS:   Gen: Denies fever, chills, weight change, fatigue, night sweats HEENT: Denies blurred vision, double vision, hearing loss, tinnitus, sinus congestion, rhinorrhea, sore throat, neck stiffness, dysphagia PULM: Denies shortness of breath, cough, sputum production, hemoptysis, wheezing CV: Per history of present illness GI: Denies abdominal pain, nausea, vomiting, diarrhea, hematochezia, melena, constipation, change in bowel habits GU: Denies dysuria, hematuria, polyuria, oliguria, urethral discharge Endocrine: Denies hot or cold intolerance, polyuria, polyphagia or appetite change Derm: Denies rash, dry skin, scaling or peeling skin change Heme: Denies easy bruising, bleeding, bleeding gums Neuro: Denies headache, numbness, weakness, slurred speech, loss of memory or consciousness   SUBJECTIVE:   VITAL SIGNS: Temp:  [98 F (36.7 C)] 98 F (36.7 C) (05/19 1845) Pulse Rate:  [83-90] 90 (  05/19 1915) Resp:  [16-19] 16 (05/19 1915) BP: (99-113)/(56-70) 101/66 mmHg (05/19 1915) SpO2:  [99 %] 99 % (05/19 1915) HEMODYNAMICS:    VENTILATOR SETTINGS:   INTAKE / OUTPUT: No intake or output data in the 24 hours ending 06/14/14 1926  PHYSICAL EXAMINATION: General:  Chronically ill-appearing, no acute distress Neuro:  Alert and oriented 4, moves all 4 extremities well HEENT:  NCAT, pupils equal round reactive to light, oropharynx clear Cardiovascular:  Regular rate and rhythm systolic murmur noted Lungs:  Clear to auscultation bilaterally Abdomen:  Bowel sounds positive, midline ventral hernia noted, easily reduced Musculoskeletal:  Normal bulk and tone Skin:  No rash or skin breakdown  LABS:  CBC No results for input(s): WBC, HGB, HCT, PLT in the last 168 hours. Coag's No results for input(s): APTT, INR in the last 168 hours. BMET No results for input(s): NA, K, CL, CO2, BUN, CREATININE, GLUCOSE in the last 168 hours. Electrolytes No results for input(s): CALCIUM, MG, PHOS in the last 168 hours. Sepsis Markers No results for input(s): LATICACIDVEN, PROCALCITON, O2SATVEN in the last 168 hours. ABG No results for input(s): PHART, PCO2ART, PO2ART in the last 168 hours. Liver Enzymes No results for input(s): AST, ALT, ALKPHOS, BILITOT, ALBUMIN in the last 168 hours. Cardiac Enzymes No results for input(s): TROPONINI, PROBNP in the last 168 hours. Glucose No results for input(s): GLUCAP in the last 168 hours.  Imaging No results found.   ASSESSMENT / PLAN:  PULMONARY OETT not indicated A: No acute issues P:   Monitor O2 saturation  CARDIOVASCULAR A: Possible cardiogenic shock at outside hospital, appears resolved now as blood pressure normal on minimal dopamine Aortic valve insufficiency, unclear if this is acute or chronic, preserved LVEF noted on echocardiogram at outside hospital P:  Wean off dopamine Stat cardiology consult, further imaging and workup per cardiology  RENAL A:  Elevated serum creatinine, uncertain baseline kidney function P:   Repeat be met now Monitor urine  output  GASTROINTESTINAL A:  No acute issues P:   Heart healthy diet  HEMATOLOGIC A:  No acute issues P:  Monitor for bleeding, check CBC  INFECTIOUS A:  No acute issues P:   Monitor for fever  ENDOCRINE A:  No acute issues   P:   Monitor glucose  NEUROLOGIC A: Urine drug screen noted to be positive for cocaine outside hospital P:   Check urine drug screen    TODAY'S SUMMARY: Aortic valve insufficiency of uncertain duration, also with kidney insufficiency of uncertain duration and acuity. Apparently he was in cardiogenic shock but currently his blood pressure is elevated on a minimal dose of dopamine. We will stop the dopamine and ask cardiology to assume care.   Roselie Awkward, MD [place] [facility] Pager: [phone] Cell: [phone] If no response, call [phone]   06/14/2014, 7:26 PM

## 2014-06-14 NOTE — Consult Note (Addendum)
Reason for Consult: for prosthetic valve failure   Referring Physician: Dr. Lake Bells    PCP:  [name], MD  Primary Cardiologist: New- saw [facility] in the past  [name] is an 69 y.o. male.    Chief Complaint: Pt went to [facility] for 4 days or increased SOB.     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  ( 25 mm St Jude valve conduit)  in the setting of an aortic aneurysm and AI in the 1993 by Dr. [name] at [facility].  ( His cath at that time revealed normal coronary arteries.)   He went to [facility] on 06/13/2014 complaining of shortness of breath.  He had been SOB and swelling for 4 days, He was found to have severe aortic valve insufficiency and so he was transferred to [facility] for further evaluation.  TEE was done but do not have record only valve failure.  He had not been seen by PCP for a year and no meds for 3 months.  He was given diuretic and pt felt better but today he became hypotensive.  He is on dopamine.     Currently pt without pain, some SOB and BP is low. On IV Dopamine.  EKG a fib with rt ward axis, non specific T wave abnormality similar to 2011 EKG though now a fib.  Labs at [facility] Na 126, K+4.8, T Bili 1.4, AST 52  Troponin T 0.59  CKMB 10.3  Pro bnp 13,873  D-dimer 6.48, TSH 2.50 Mag 2.2       Past Medical History  Diagnosis Date  . Chest pain   . Hypertension   . Aortic aneurysm 1993  . 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 Pneumonia   . Aortic valve replaced      At [facility]  . Hx of aortic aneurysm repair 06/14/2014    Past Surgical History  Procedure Laterality Date  . Abdominal aortic aneurysm repair  1993    At [facility], Unspecified  . Splenectomy    . Cardiac valve replacement      At [facility]  . Esophagogastroduodenoscopy  Aug 2014    [facility]: large duodenal ulcer with  visible vessel, s/p clip and epi, erosive gastritis. +h.pylori serology, treated with amoxicillin and biaxin.   . Colonoscopy N/A 12/27/2012    Procedure: COLONOSCOPY;  Surgeon: Danie Binder, MD;  Location: [facility];  Service: Endoscopy;  Laterality: N/A;  8:45-moved to 79     Family History  Problem Relation Age of Onset  . Hypertension Father 62  . Diabetes Mother 71  . Colon cancer Neg Hx    Social History:  reports that he has quit smoking. He does not have any smokeless tobacco history on file. He reports that he does not drink alcohol or use illicit drugs.  Allergies: No Known Allergies  OUTPATIENT MEDS No current facility-administered medications on file prior to encounter.   Current Outpatient Prescriptions on File Prior to Encounter  Medication Sig Dispense Refill  . olmesartan-hydrochlorothiazide (BENICAR HCT) 20-12.5 MG per tablet Take 1 tablet by mouth 2 (two) times a week.      Current MEDS Scheduled Meds: . heparin  5,000 Units Subcutaneous 3 times per day  . [START ON 06/15/2014] pneumococcal 23 valent vaccine  0.5 mL Intramuscular Tomorrow-1000   Continuous  Infusions: . DOPamine 3 mcg/kg/min (06/14/14 2040)   PRN Meds:.sodium chloride  Results for orders placed or performed during the hospital encounter of 06/14/14 (from the past 48 hour(s))  Urine rapid drug screen (hosp performed)     Status: Abnormal   Collection Time: 06/14/14  7:44 PM  Result Value Ref Range   Opiates NONE DETECTED NONE DETECTED   Cocaine NONE DETECTED NONE DETECTED   Benzodiazepines POSITIVE (A) NONE DETECTED   Amphetamines NONE DETECTED NONE DETECTED   Tetrahydrocannabinol NONE DETECTED NONE DETECTED   Barbiturates NONE DETECTED NONE DETECTED    Comment:        DRUG SCREEN FOR MEDICAL PURPOSES ONLY.  IF CONFIRMATION IS NEEDED FOR ANY PURPOSE, NOTIFY LAB WITHIN 5 DAYS.        LOWEST DETECTABLE LIMITS FOR URINE DRUG SCREEN Drug Class       Cutoff (ng/mL) Amphetamine       1000 Barbiturate      200 Benzodiazepine   492 Tricyclics       010 Opiates          300 Cocaine          300 THC              50    Dg Chest Port 1 View  06/14/2014   CLINICAL DATA:  CHF exacerbation, history hypertension and prior AVR  EXAM: PORTABLE CHEST - 1 VIEW  COMPARISON:  Portable exam 2010 hours compared to 06/13/2014  FINDINGS: Enlargement of cardiac silhouette post median sternotomy.  Tortuous aorta.  Mediastinal contours and pulmonary vascularity normal.  Minimal bibasilar atelectasis.  No gross infiltrate, pleural effusion or pneumothorax.  IMPRESSION: Enlargement of cardiac silhouette with minimal bibasilar atelectasis.   Electronically Signed   By: Lavonia Dana [name]   On: 06/14/2014 20:50    ROS: General:no colds or fevers, increase of weight  Skin:no rashes or ulcers HEENT:no blurred vision, no congestion CV:see HPI PUL:see HPI GI:no diarrhea constipation or melena, no indigestion GU:no hematuria, no dysuria MS:no joint pain, no claudication, + edema Neuro:no syncope, no lightheadedness Endo:no diabetes, no thyroid disease   Blood pressure 123/79, pulse 83, temperature 97.9 F (36.6 C), temperature source Oral, resp. rate 18, weight 225 lb 15.5 oz (102.5 kg), SpO2 100 %.  Wt Readings from Last 3 Encounters:  06/14/14 225 lb 15.5 oz (102.5 kg)  12/27/12 223 lb (101.152 kg)  11/30/12 223 lb 12.8 oz (101.515 kg)    PE: per Dr. Meda Coffee  General:Pleasant affect, NAD Skin:Warm and dry, brisk capillary refill HEENT:normocephalic, sclera clear, mucus membranes moist Neck:supple, + JVD to jaw, no bruits  Heart:S1S2 RRR with 3/6  murmur, no gallup, rub or click Lungs:diminished bases with few crackles, no rhonchi, or wheezes OFH:QRFX, non tender, + BS, do not palpate liver spleen or masses Ext:no lower ext edema, 2+ pedal pulses, 2+ radial pulses Neuro:alert and oriented X 3, MAE, follows commands, + facial symmetry    Assessment/Plan Principal Problem:   Aortic  valve insufficiency- with hx of AVR now with valve failure, for Echo and may need fluoroscopy/cardiac cath tomorrow will make NPO after MN, Echo to be done tonight. Have called for TEE from [facility], may need to call tomorrow.  Dr. Meda Coffee has seen and evaluated , currently not significantly wet, will hold diuretic for now  Active Problems:   Cardiogenic shock, possible- on IV dopamine    Hx of aortic aneurysm repair- 1993     Atrial fibrillation, CHA2DS2VASc score 3-  do not know length of time adding IV Heparin    Elevated troponin- serial troponin, with hypotension unable to add BB, IV heparin, check lipids in am   Positive D dimer add IV heparin, may need CTA if not previously done     [facility] R  Nurse Practitioner Certified [place] Pager [phone] or after 5pm or weekends call [phone] 06/14/2014, 9:11 PM    The patient was seen, examined and discussed with Cecilie Kicks, NP and I agree with the above.   69 year old male with h/o aortic root and aortic valve replacement at [facility] in 1993 with 25 mm St Jude valve conduit, who presented to [facility] yesterday with acute CHF presumably sec to valve dysfunction. TTE and TEE performed at [facility], but incomplete records sent. The patient was started on Dopamin drip for hypotension, currently on 5 mcg with BP 90/66 mmHg. He had elevated troponin 0.59, D-dimer at 6.7, BNP 13.000.  On physical exam the patient doesn't appear to be in acute distress, he has JVDs up to the jaws, minimal crackles at lung bases and minimal LE edema. He has significant holosystolic and diastolic murmur.  Bedside echo performed with handheld echo machine shows at least moderate AI, normal LVEF, dilated right ventricle with moderate dysfunction, transaortic gradients cant be assessed.   It is not clear if the patient has mechanical or bioprosthetic valve, on exam it sounds like mechanical, however no significant  reverberation artifact on limited bedside echo and the patient hasn't been on Coumadin.  The patient had an echo done in 2009 - it states that the patient has mechanical and porcine valve (must be mistake), however there was no AI at the time, and increased transaortic gradients peak 54 and mean 32 mmHg.  We will start iv Heparin. We will obtain stat complete echo for further valve evaluation and evaluation of filling pressures and pulmonary hypertension.  The patient appears stable and no intervention needs to be tonight. If mechanical valve with limited motion a fluoroscopy might be considered.  We have requested missing documentation from the [facility] (TTE, TEE results). We will consider V/Q scan based on echo results.The patient is already being started on Coumadin.  The patient is in rate controlled a-fib, ECG nonspecific ST-T wave are unchanged from [address].  I would hold diuretics as he is hypotensive and doesn't appear to be significantly fluid overloaded.   Dorothy Spark 06/14/2014

## 2014-06-14 NOTE — Progress Notes (Signed)
Pt arrived via EMS from [facility] ICU for further evaluation by CCM. Pt AAox4 but slow to answer questions. Strength equal strong in all extremities. Perrla. VSS. NAD no c/o pain. Pt came to unit on 39mcg of dopamine running. Will cont to monitor.

## 2014-06-14 NOTE — Consult Note (Signed)
ANTICOAGULATION CONSULT NOTE - Initial Consult  Pharmacy Consult for Heparin Indication: atrial fibrillation, r/o ACS, r/o PE  No Known Allergies  Patient Measurements: Height: 5' 8.9" (175 cm) Weight: 225 lb 15.5 oz (102.5 kg) IBW/kg (Calculated) : 70.47 Heparin Dosing Weight: 92.4kg  Vital Signs: Temp: 97.9 F (36.6 C) (05/19 2000) Temp Source: Oral (05/19 2000) BP: 123/79 mmHg (05/19 2100) Pulse Rate: 83 (05/19 2100)  Labs: No results for input(s): HGB, HCT, PLT, APTT, LABPROT, INR, HEPARINUNFRC, CREATININE, CKTOTAL, CKMB, TROPONINI in the last 72 hours.  CrCl cannot be calculated (Patient has no serum creatinine result on file.).   Medical History: Past Medical History  Diagnosis Date  . Chest pain   . Hypertension   . Aortic aneurysm 1993  . 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 Pneumonia   . Aortic valve replaced      At [facility]  . Hx of aortic aneurysm repair 06/14/2014   Assessment: 69yom transferred from [facility] with elevated troponin, elevated d-dimer, and afib in the setting of possible cardiogenic shock 2/2 aortic valve insufficiency. He will begin IV heparin for his afib (CHADSVASC =3) as well as possible ACS and PE. Pertinent labs from [facility]: sCr 1.43, INR 1.5, Hgb 12, Plts 125. He also received a dose of lovenox 30mg  yesterday at 1658.  Goal of Therapy:  Heparin level 0.3-0.7 units/ml Monitor platelets by anticoagulation protocol: Yes   Plan:  1) Heparin bolus 2500 units x 1 (smaller due to elevated baseline INR) 2) Heparin drip at 1500 units/hr 3) Check 6 hour heparin level 4) Daily heparin level and CBC  Deboraha Sprang 06/14/2014,9:32 PM

## 2014-06-14 NOTE — Progress Notes (Signed)
CRITICAL VALUE ALERT  Critical value received:  Troponin 1.28  Date of notification:  06/14/2014  Time of notification:  2230  Critical value read back:Yes.    Nurse who received alert:  Lesli Albee, RN  Time of 1st call to [place]  MD notified- Chase Caller, CCM

## 2014-06-15 ENCOUNTER — Inpatient Hospital Stay (HOSPITAL_COMMUNITY): Payer: Commercial Managed Care - HMO

## 2014-06-15 ENCOUNTER — Other Ambulatory Visit: Payer: Self-pay | Admitting: *Deleted

## 2014-06-15 ENCOUNTER — Encounter (HOSPITAL_COMMUNITY): Payer: Self-pay

## 2014-06-15 ENCOUNTER — Encounter (HOSPITAL_COMMUNITY)
Admission: RE | Disposition: A | Discharge status: Rehabilitation Facility | Payer: Medicare PPO | Source: Other Acute Inpatient Hospital | Attending: Cardiology

## 2014-06-15 ENCOUNTER — Encounter (HOSPITAL_COMMUNITY)
Admission: RE | Disposition: A | Discharge status: Rehabilitation Facility | Payer: Self-pay | Source: Other Acute Inpatient Hospital | Attending: Cardiology

## 2014-06-15 DIAGNOSIS — I351 Nonrheumatic aortic (valve) insufficiency: Secondary | ICD-10-CM

## 2014-06-15 HISTORY — PX: CARDIAC CATHETERIZATION: SHX172

## 2014-06-15 HISTORY — PX: TEE WITHOUT CARDIOVERSION: SHX5443

## 2014-06-15 LAB — HEPARIN LEVEL (UNFRACTIONATED)
Heparin Unfractionated: 0.52 IU/mL (ref 0.30–0.70)
Heparin Unfractionated: 0.79 IU/mL — ABNORMAL HIGH (ref 0.30–0.70)
Heparin Unfractionated: 0.86 IU/mL — ABNORMAL HIGH (ref 0.30–0.70)

## 2014-06-15 LAB — BASIC METABOLIC PANEL
Anion gap: 9 (ref 5–15)
BUN: 24 mg/dL — AB (ref 6–20)
CALCIUM: 9.2 mg/dL (ref 8.9–10.3)
CO2: 27 mmol/L (ref 22–32)
Chloride: 93 mmol/L — ABNORMAL LOW (ref 101–111)
Creatinine, Ser: 1.48 mg/dL — ABNORMAL HIGH (ref 0.61–1.24)
GFR calc Af Amer: 55 mL/min — ABNORMAL LOW (ref >60)
GFR calc non Af Amer: 47 mL/min — ABNORMAL LOW (ref >60)
GLUCOSE: 96 mg/dL (ref 65–99)
Potassium: 4.4 mmol/L (ref 3.5–5.1)
SODIUM: 129 mmol/L — AB (ref 135–145)

## 2014-06-15 LAB — POCT I-STAT 3, ART BLOOD GAS (G3+)
Acid-base deficit: 2 mmol/L (ref 0.0–2.0)
Bicarbonate: 23.2 mEq/L (ref 20.0–24.0)
O2 Saturation: 99 %
PCO2 ART: 40.2 mmHg (ref 35.0–45.0)
PH ART: 7.37 (ref 7.350–7.450)
Patient temperature: 98.6
TCO2: 24 mmol/L (ref 0–100)
pO2, Arterial: 123 mmHg — ABNORMAL HIGH (ref 80.0–100.0)

## 2014-06-15 LAB — COMPREHENSIVE METABOLIC PANEL
ALT: 27 U/L (ref 17–63)
AST: 40 U/L (ref 15–41)
Albumin: 3.4 g/dL — ABNORMAL LOW (ref 3.5–5.0)
Alkaline Phosphatase: 51 U/L (ref 38–126)
Anion gap: 12 (ref 5–15)
BUN: 21 mg/dL — ABNORMAL HIGH (ref 6–20)
CO2: 25 mmol/L (ref 22–32)
Calcium: 9 mg/dL (ref 8.9–10.3)
Chloride: 93 mmol/L — ABNORMAL LOW (ref 101–111)
Creatinine, Ser: 1.39 mg/dL — ABNORMAL HIGH (ref 0.61–1.24)
GFR calc Af Amer: 59 mL/min — ABNORMAL LOW (ref >60)
GFR calc non Af Amer: 51 mL/min — ABNORMAL LOW (ref >60)
Glucose, Bld: 90 mg/dL (ref 65–99)
Potassium: 4.4 mmol/L (ref 3.5–5.1)
Sodium: 130 mmol/L — ABNORMAL LOW (ref 135–145)
Total Bilirubin: 1.9 mg/dL — ABNORMAL HIGH (ref 0.3–1.2)
Total Protein: 6.9 g/dL (ref 6.5–8.1)

## 2014-06-15 LAB — CBC
HEMATOCRIT: 34.3 % — AB (ref 39.0–52.0)
Hemoglobin: 11.7 g/dL — ABNORMAL LOW (ref 13.0–17.0)
MCH: 32.3 pg (ref 26.0–34.0)
MCHC: 34.1 g/dL (ref 30.0–36.0)
MCV: 94.8 fL (ref 78.0–100.0)
PLATELETS: 112 10*3/uL — AB (ref 150–400)
RBC: 3.62 MIL/uL — ABNORMAL LOW (ref 4.22–5.81)
RDW: 14.1 % (ref 11.5–15.5)
WBC: 9.3 10*3/uL (ref 4.0–10.5)

## 2014-06-15 LAB — MAGNESIUM: Magnesium: 2.1 mg/dL (ref 1.7–2.4)

## 2014-06-15 LAB — TROPONIN I
TROPONIN I: 1.33 ng/mL — AB (ref <0.031)
Troponin I: 1.09 ng/mL (ref <0.031)

## 2014-06-15 LAB — PROTIME-INR
INR: 1.67 — ABNORMAL HIGH (ref 0.00–1.49)
Prothrombin Time: 19.7 seconds — ABNORMAL HIGH (ref 11.6–15.2)

## 2014-06-15 LAB — ABO/RH: ABO/RH(D): A POS

## 2014-06-15 LAB — SURGICAL PCR SCREEN
MRSA, PCR: NEGATIVE
Staphylococcus aureus: NEGATIVE

## 2014-06-15 SURGERY — ECHOCARDIOGRAM, TRANSESOPHAGEAL
Anesthesia: Moderate Sedation

## 2014-06-15 SURGERY — FLUOROSCOPY GUIDANCE
Anesthesia: LOCAL

## 2014-06-15 MED ORDER — LIDOCAINE VISCOUS 2 % MT SOLN
OROMUCOSAL | Status: AC
  Filled 2014-06-15: qty 15

## 2014-06-15 MED ORDER — ATORVASTATIN CALCIUM 40 MG PO TABS
40.0000 mg | ORAL_TABLET | Freq: Every day | ORAL | Status: DC
  Administered 2014-06-15 – 2014-06-20 (×6): 40 mg via ORAL
  Filled 2014-06-15 (×7): qty 1

## 2014-06-15 MED ORDER — FENTANYL CITRATE (PF) 100 MCG/2ML IJ SOLN
INTRAMUSCULAR | Status: DC | PRN
  Administered 2014-06-15: 25 ug via INTRAVENOUS

## 2014-06-15 MED ORDER — MIDAZOLAM HCL 5 MG/ML IJ SOLN
INTRAMUSCULAR | Status: AC
  Filled 2014-06-15: qty 2

## 2014-06-15 MED ORDER — SODIUM CHLORIDE 0.9 % IV SOLN
2.0000 g | INTRAVENOUS | Status: AC
  Administered 2014-06-15: 2 g via INTRAVENOUS
  Filled 2014-06-15: qty 2000

## 2014-06-15 MED ORDER — EPTIFIBATIDE 75 MG/100ML IV SOLN
1.0000 ug/kg/min | INTRAVENOUS | Status: DC
  Administered 2014-06-15: 2 ug/kg/min via INTRAVENOUS
  Administered 2014-06-16: 1 ug/kg/min via INTRAVENOUS
  Administered 2014-06-16 (×2): 2 ug/kg/min via INTRAVENOUS
  Filled 2014-06-15 (×9): qty 100

## 2014-06-15 MED ORDER — ASPIRIN 81 MG PO CHEW
81.0000 mg | CHEWABLE_TABLET | Freq: Every day | ORAL | Status: DC
  Administered 2014-06-15 – 2014-06-20 (×6): 81 mg via ORAL
  Filled 2014-06-15 (×7): qty 1

## 2014-06-15 MED ORDER — LIDOCAINE VISCOUS 2 % MT SOLN
OROMUCOSAL | Status: DC | PRN
  Administered 2014-06-15: 10 mL via OROMUCOSAL

## 2014-06-15 MED ORDER — OFF THE BEAT BOOK
Freq: Once | Status: AC
  Administered 2014-06-15: 10:00:00
  Filled 2014-06-15: qty 1

## 2014-06-15 MED ORDER — BUTAMBEN-TETRACAINE-BENZOCAINE 2-2-14 % EX AERO
INHALATION_SPRAY | CUTANEOUS | Status: DC | PRN
  Administered 2014-06-15: 2 via TOPICAL

## 2014-06-15 MED ORDER — SODIUM CHLORIDE 0.9 % IV SOLN
INTRAVENOUS | Status: DC
  Administered 2014-06-15: 09:00:00 via INTRAVENOUS

## 2014-06-15 MED ORDER — FENTANYL CITRATE (PF) 100 MCG/2ML IJ SOLN
INTRAMUSCULAR | Status: AC
  Filled 2014-06-15: qty 2

## 2014-06-15 MED FILL — Dopamine in Dextrose 5% Inj 3.2 MG/ML: INTRAVENOUS | Qty: 250 | Status: AC

## 2014-06-15 NOTE — Progress Notes (Signed)
ANTICOAGULATION CONSULT NOTE - Follow Up Consult  Pharmacy Consult for Heparin Indication: chest pain/ACS  No Known Allergies  Patient Measurements: Height: 5' 8.9" (175 cm) Weight: 223 lb 5.2 oz (101.3 kg) IBW/kg (Calculated) : 70.47  Vital Signs: Temp: 97.4 F (36.3 C) (05/20 0800) Temp Source: Oral (05/20 0800) BP: 107/68 mmHg (05/20 0800) Pulse Rate: 80 (05/20 0800)  Labs:  Recent Labs  06/14/14 2105 06/15/14 0112 06/15/14 0729  HGB 12.3*  --  11.7*  HCT 34.8*  --  34.3*  PLT 122*  --  112*  HEPARINUNFRC  --  0.52 0.79*  CREATININE 1.60*  --  1.48*  TROPONINI 1.28* 1.33* 1.09*    Estimated Creatinine Clearance: 56.7 mL/min (by C-G formula based on Cr of 1.48).   Assessment: Coag: afib (CHADSVASC =3) as well as possible ACS and PE. Pertinent labs from [facility]: sCr 1.43, INR 1.5, Hgb 12, Plts 125. He also received a dose of lovenox 30mg  5/18 at 1658. > heparin level above goal at 1500/h, per RN nose bleed this am.  CBC stable, bleeding resolved.   9:54 AM decrease to 1300 units/hr check Xa in 6h   Goal of Therapy:  Heparin level 0.3-0.7 units/ml Monitor platelets by anticoagulation protocol: Yes   Plan:  1. Decrease heparin to 1300 units/hr follow up Xa level in 6h  Thank you for allowing pharmacy to be a part of this patients care team.  Rowe Robert Pharm.D., BCPS, AQ-Cardiology Clinical Pharmacist 06/15/2014 10:02 AM Pager: [phone] Phone: [phone]

## 2014-06-15 NOTE — Progress Notes (Signed)
Pt. Still having bloody urine. MD notified and will continue to hold Integrilin. Will continue to monitor urine.

## 2014-06-15 NOTE — Progress Notes (Signed)
ANTICOAGULATION CONSULT NOTE - Follow Up Consult  Pharmacy Consult for heparin Indication: Afib, r/o ACS, r/o PE  Labs:  Recent Labs  06/14/14 2105 06/15/14 0112  HGB 12.3*  --   HCT 34.8*  --   PLT 122*  --   HEPARINUNFRC  --  0.52  CREATININE 1.60*  --   TROPONINI 1.28*  --     Assessment/Plan:  69yo male therapeutic on heparin with initial dosing though lab was drawn just 3.5 hours after bolus so true level may be lower. Will continue gtt at current rate and confirm stable with additional level.   Wynona Neat, PharmD, BCPS  06/15/2014,2:26 AM

## 2014-06-15 NOTE — Care Management Note (Signed)
Case Management Note  Patient Details  Name: [name] MRN: [id number] Date of Birth: 1945/07/16  Subjective/Objective:    Adm w aortic valve insuff                Action/Plan:  Lives w wife, pcp dr Berneta S[place]   Expected Discharge Date:                  Expected Discharge Plan:  [place]  In-House Referral:     Discharge planning Services     Post Acute Care Choice:    Choice offered to:     DME Arranged:    DME Agency:     HH Arranged:    [place] Agency:     Status of Service:     Medicare Important Message Given:    Date Medicare IM Given:    Medicare IM give by:    Date Additional Medicare IM Given:    Additional Medicare Important Message give by:     If discussed at [place] of Stay Meetings, dates discussed:    Additional Comments: ur review done  Lacretia Leigh, RN 06/15/2014, 9:18 AM

## 2014-06-15 NOTE — Interval H&P Note (Signed)
History and Physical Interval Note:  06/15/2014 10:48 AM  [name]  has presented today for surgery, with the diagnosis of aortic valve leaflet  The various methods of treatment have been discussed with the patient and family. After consideration of risks, benefits and other options for treatment, the patient has consented to  Procedure(s): Fluoroscopy Guidance (N/A) as a surgical intervention .  The patient's history has been reviewed, patient examined, no change in status, stable for surgery.  I have reviewed the patient's chart and labs.  Questions were answered to the patient's satisfaction.     Jenkins Rouge

## 2014-06-15 NOTE — Progress Notes (Addendum)
ANTICOAGULATION CONSULT NOTE - Follow Up Consult  Pharmacy Consult for heparin and integrilin Indication: chest pain/ACS, awaiting potential OHS; mechanical aortic valve thrombosis  No Known Allergies  Patient Measurements: Height: 5\' 9"  (175.3 cm) Weight: 223 lb (101.152 kg) IBW/kg (Calculated) : 70.7 Heparin Dosing Weight:92 kg  Vital Signs: Temp: 97.4 F (36.3 C) (05/20 1606) Temp Source: Oral (05/20 1606) BP: 99/73 mmHg (05/20 1445) Pulse Rate: 90 (05/20 1445)  Labs:  Recent Labs  06/14/14 2105 06/15/14 0112 06/15/14 0729 06/15/14 1336  HGB 12.3*  --  11.7*  --   HCT 34.8*  --  34.3*  --   PLT 122*  --  112*  --   LABPROT  --   --   --  19.7*  INR  --   --   --  1.67*  HEPARINUNFRC  --  0.52 0.79*  --   CREATININE 1.60*  --  1.48* 1.39*  TROPONINI 1.28* 1.33* 1.09*  --     Estimated Creatinine Clearance: 60.5 mL/min (by C-G formula based on Cr of 1.39).   Medications:  Scheduled:  . aspirin  81 mg Oral Daily  . atorvastatin  40 mg Oral q1800  . pneumococcal 23 valent vaccine  0.5 mL Intramuscular Tomorrow-1000   Infusions:  . heparin 1,300 Units/hr (06/15/14 1336)    Assessment: 69 yo male with ACS and mechanical aortic valve thrombosis is currently on supratherapeutic heparin.  Heparin level is 0.86. Patient will also be starting on integrelin per cardiac surgeon's request.  CrCl ~60.5  Goal of Therapy:  Heparin level 0.3-0.5 since will be on integrilin Monitor platelets by anticoagulation protocol: Yes   Plan:  - Reduce heparin to 1050 units/hr - Start integrelin @ 2 mcg/kg/min - Recheck CBC and heparin level in 6 hours  Jidenna Figgs, Tsz-Yin 06/15/2014,4:35 PM

## 2014-06-15 NOTE — H&P (View-Only) (Signed)
Reason for Consult: for prosthetic valve failure   Referring Physician: Dr. Lake Bells    PCP:  [name], MD  Primary Cardiologist: New- saw [facility]ent in the past  [name] is an 69 y.o. male.    Chief Complaint: Pt went to [facility] for 4 days or increased SOB.     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  ( 25 mm St Jude valve conduit)  in the setting of an aortic aneurysm and AI in the 1993 by Dr. [name] at [facility].  ( His cath at that time revealed normal coronary arteries.)   He went to [facility] on 06/13/2014 complaining of shortness of breath.  He had been SOB and swelling for 4 days, He was found to have severe aortic valve insufficiency and so he was transferred to [facility] for further evaluation.  TEE was done but do not have record only valve failure.  He had not been seen by PCP for a year and no meds for 3 months.  He was given diuretic and pt felt better but today he became hypotensive.  He is on dopamine.     Currently pt without pain, some SOB and BP is low. On IV Dopamine.  EKG a fib with rt ward axis, non specific T wave abnormality similar to 2011 EKG though now a fib.  Labs at [facility] Na 126, K+4.8, T Bili 1.4, AST 52  Troponin T 0.59  CKMB 10.3  Pro bnp 13,873  D-dimer 6.48, TSH 2.50 Mag 2.2       Past Medical History  Diagnosis Date  . Chest pain   . Hypertension   . Aortic aneurysm 1993  . 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 Pneumonia   . Aortic valve replaced      At [facility]  . Hx of aortic aneurysm repair 06/14/2014    Past Surgical History  Procedure Laterality Date  . Abdominal aortic aneurysm repair  1993    At [facility], Unspecified  . Splenectomy    . Cardiac valve replacement      At [facility]  . Esophagogastroduodenoscopy  Aug 2014    [facility]: large duodenal ulcer with  visible vessel, s/p clip and epi, erosive gastritis. +h.pylori serology, treated with amoxicillin and biaxin.   . Colonoscopy N/A 12/27/2012    Procedure: COLONOSCOPY;  Surgeon: Danie Binder, MD;  Location: [facility];  Service: Endoscopy;  Laterality: N/A;  8:45-moved to 62     Family History  Problem Relation Age of Onset  . Hypertension Father 26  . Diabetes Mother 75  . Colon cancer Neg Hx    Social History:  reports that he has quit smoking. He does not have any smokeless tobacco history on file. He reports that he does not drink alcohol or use illicit drugs.  Allergies: No Known Allergies  OUTPATIENT MEDS No current facility-administered medications on file prior to encounter.   Current Outpatient Prescriptions on File Prior to Encounter  Medication Sig Dispense Refill  . olmesartan-hydrochlorothiazide (BENICAR HCT) 20-12.5 MG per tablet Take 1 tablet by mouth 2 (two) times a week.      Current MEDS Scheduled Meds: . heparin  5,000 Units Subcutaneous 3 times per day  . [START ON 06/15/2014] pneumococcal 23 valent vaccine  0.5 mL Intramuscular Tomorrow-1000   Continuous  Infusions: . DOPamine 3 mcg/kg/min (06/14/14 2040)   PRN Meds:.sodium chloride  Results for orders placed or performed during the hospital encounter of 06/14/14 (from the past 48 hour(s))  Urine rapid drug screen (hosp performed)     Status: Abnormal   Collection Time: 06/14/14  7:44 PM  Result Value Ref Range   Opiates NONE DETECTED NONE DETECTED   Cocaine NONE DETECTED NONE DETECTED   Benzodiazepines POSITIVE (A) NONE DETECTED   Amphetamines NONE DETECTED NONE DETECTED   Tetrahydrocannabinol NONE DETECTED NONE DETECTED   Barbiturates NONE DETECTED NONE DETECTED    Comment:        DRUG SCREEN FOR MEDICAL PURPOSES ONLY.  IF CONFIRMATION IS NEEDED FOR ANY PURPOSE, NOTIFY LAB WITHIN 5 DAYS.        LOWEST DETECTABLE LIMITS FOR URINE DRUG SCREEN Drug Class       Cutoff (ng/mL) Amphetamine       1000 Barbiturate      200 Benzodiazepine   294 Tricyclics       765 Opiates          300 Cocaine          300 THC              50    Dg Chest Port 1 View  06/14/2014   CLINICAL DATA:  CHF exacerbation, history hypertension and prior AVR  EXAM: PORTABLE CHEST - 1 VIEW  COMPARISON:  Portable exam 2010 hours compared to 06/13/2014  FINDINGS: Enlargement of cardiac silhouette post median sternotomy.  Tortuous aorta.  Mediastinal contours and pulmonary vascularity normal.  Minimal bibasilar atelectasis.  No gross infiltrate, pleural effusion or pneumothorax.  IMPRESSION: Enlargement of cardiac silhouette with minimal bibasilar atelectasis.   Electronically Signed   By: Lavonia Dana [name]   On: 06/14/2014 20:50    ROS: General:no colds or fevers, increase of weight  Skin:no rashes or ulcers HEENT:no blurred vision, no congestion CV:see HPI PUL:see HPI GI:no diarrhea constipation or melena, no indigestion GU:no hematuria, no dysuria MS:no joint pain, no claudication, + edema Neuro:no syncope, no lightheadedness Endo:no diabetes, no thyroid disease   Blood pressure 123/79, pulse 83, temperature 97.9 F (36.6 C), temperature source Oral, resp. rate 18, weight 225 lb 15.5 oz (102.5 kg), SpO2 100 %.  Wt Readings from Last 3 Encounters:  06/14/14 225 lb 15.5 oz (102.5 kg)  12/27/12 223 lb (101.152 kg)  11/30/12 223 lb 12.8 oz (101.515 kg)    PE: per Dr. Meda Coffee  General:Pleasant affect, NAD Skin:Warm and dry, brisk capillary refill HEENT:normocephalic, sclera clear, mucus membranes moist Neck:supple, + JVD to jaw, no bruits  Heart:S1S2 RRR with 3/6  murmur, no gallup, rub or click Lungs:diminished bases with few crackles, no rhonchi, or wheezes YYT:KPTW, non tender, + BS, do not palpate liver spleen or masses Ext:no lower ext edema, 2+ pedal pulses, 2+ radial pulses Neuro:alert and oriented X 3, MAE, follows commands, + facial symmetry    Assessment/Plan Principal Problem:   Aortic  valve insufficiency- with hx of AVR now with valve failure, for Echo and may need fluoroscopy/cardiac cath tomorrow will make NPO after MN, Echo to be done tonight. Have called for TEE from [facility], may need to call tomorrow.  Dr. Meda Coffee has seen and evaluated , currently not significantly wet, will hold diuretic for now  Active Problems:   Cardiogenic shock, possible- on IV dopamine    Hx of aortic aneurysm repair- 1993     Atrial fibrillation, CHA2DS2VASc score 3-  do not know length of time adding IV Heparin    Elevated troponin- serial troponin, with hypotension unable to add BB, IV heparin, check lipids in am   Positive D dimer add IV heparin, may need CTA if not previously done     [facility] R  Nurse Practitioner Certified Warden Pager [phone] or after 5pm or weekends call [phone] 06/14/2014, 9:11 PM    The patient was seen, examined and discussed with Cecilie Kicks, NP and I agree with the above.   69 year old male with h/o aortic root and aortic valve replacement at [facility] in 1993 with 25 mm St Jude valve conduit, who presented to [facility] yesterday with acute CHF presumably sec to valve dysfunction. TTE and TEE performed at [facility], but incomplete records sent. The patient was started on Dopamin drip for hypotension, currently on 5 mcg with BP 90/66 mmHg. He had elevated troponin 0.59, D-dimer at 6.7, BNP 13.000.  On physical exam the patient doesn't appear to be in acute distress, he has JVDs up to the jaws, minimal crackles at lung bases and minimal LE edema. He has significant holosystolic and diastolic murmur.  Bedside echo performed with handheld echo machine shows at least moderate AI, normal LVEF, dilated right ventricle with moderate dysfunction, transaortic gradients cant be assessed.   It is not clear if the patient has mechanical or bioprosthetic valve, on exam it sounds like mechanical, however no significant  reverberation artifact on limited bedside echo and the patient hasn't been on Coumadin.  The patient had an echo done in 2009 - it states that the patient has mechanical and porcine valve (must be mistake), however there was no AI at the time, and increased transaortic gradients peak 54 and mean 32 mmHg.  We will start iv Heparin. We will obtain stat complete echo for further valve evaluation and evaluation of filling pressures and pulmonary hypertension.  The patient appears stable and no intervention needs to be tonight. If mechanical valve with limited motion a fluoroscopy might be considered.  We have requested missing documentation from the [facility] (TTE, TEE results). We will consider V/Q scan based on echo results.The patient is already being started on Coumadin.  The patient is in rate controlled a-fib, ECG nonspecific ST-T wave are unchanged from [address].  I would hold diuretics as he is hypotensive and doesn't appear to be significantly fluid overloaded.   Dorothy Spark 06/14/2014

## 2014-06-15 NOTE — Progress Notes (Signed)
Echocardiogram 2D Echocardiogram has been performed.  Kule, [place] 06/15/2014, 12:35 AM

## 2014-06-15 NOTE — Interval H&P Note (Signed)
History and Physical Interval Note:  06/15/2014 12:22 PM  [name]  has presented today for surgery, with the diagnosis of AVR  The various methods of treatment have been discussed with the patient and family. After consideration of risks, benefits and other options for treatment, the patient has consented to  Procedure(s): TRANSESOPHAGEAL ECHOCARDIOGRAM (TEE) (N/A) as a surgical intervention .  The patient's history has been reviewed, patient examined, no change in status, stable for surgery.  I have reviewed the patient's chart and labs.  Questions were answered to the patient's satisfaction.     Orry Sigl R

## 2014-06-15 NOTE — Progress Notes (Signed)
Patient Name: [name] Date of Encounter: 06/15/2014  Principal Problem:   Aortic valve insufficiency Active Problems:   Cardiogenic shock, possible   Hx of aortic aneurysm repair   Atrial fibrillation, do not know length of time   Elevated troponin   Positive D dimer   CHF exacerbation   Length of Stay: 1  SUBJECTIVE  The patient states that he feels slightly better today, laying comfortably in better.  CURRENT MEDS . pneumococcal 23 valent vaccine  0.5 mL Intramuscular Tomorrow-1000   . DOPamine Stopped (06/15/14 0400)  . heparin 1,500 Units/hr (06/15/14 0700)   OBJECTIVE  Filed Vitals:   06/15/14 0415 06/15/14 0500 06/15/14 0600 06/15/14 0700  BP:  98/60 99/74 106/62  Pulse:  74 83   Temp:      TempSrc:      Resp:  13 18 13   Height:      Weight: 223 lb 5.2 oz (101.3 kg)     SpO2:  99% 99%     Intake/Output Summary (Last 24 hours) at 06/15/14 0815 Last data filed at 06/15/14 0700  Gross per 24 hour  Intake  175.4 ml  Output    650 ml  Net -474.6 ml   Filed Weights   06/14/14 1915 06/15/14 0415  Weight: 225 lb 15.5 oz (102.5 kg) 223 lb 5.2 oz (101.3 kg)    PHYSICAL EXAM  General: Pleasant, NAD. Neuro: Alert and oriented X 3. Moves all extremities spontaneously. Psych: Normal affect. HEENT:  Normal  Neck: Supple without bruits,  JVD up to the jaws. Lungs:  Resp regular and unlabored, minimal rales at the bases. Heart: RRR no s3, s4, mechanical valve click, holosystolic and diastolic murmur. Abdomen: Soft, non-tender, non-distended, BS + x 4.  Extremities: No clubbing, cyanosis or edema. DP/PT/Radials 2+ and equal bilaterally.  Accessory Clinical Findings  CBC  Recent Labs  06/14/14 2105  WBC 9.0  HGB 12.3*  HCT 34.8*  MCV 93.8  PLT 734*   Basic Metabolic Panel  Recent Labs  06/14/14 2105 06/15/14 0112  NA 129*  --   K 4.4  --   CL 93*  --   CO2 25  --   GLUCOSE 105*  --   BUN 25*  --   CREATININE 1.60*  --   CALCIUM  9.4  --   MG  --  2.1    Recent Labs  06/14/14 2105 06/15/14 0112  TROPONINI 1.28* 1.33*   Radiology/Studies  Dg Chest Port 1 View  06/14/2014   CLINICAL DATA:  CHF exacerbation, history hypertension and prior AVR  EXAM: PORTABLE CHEST - 1 VIEW  COMPARISON:  Portable exam 2010 hours compared to 06/13/2014  FINDINGS: Enlargement of cardiac silhouette post median sternotomy.  Tortuous aorta.  Mediastinal contours and pulmonary vascularity normal.  Minimal bibasilar atelectasis.  No gross infiltrate, pleural effusion or pneumothorax.  IMPRESSION: Enlargement of cardiac silhouette with minimal bibasilar atelectasis.   Electronically Signed   By: Lavonia Dana [name]   On: 06/14/2014 20:50   TELE: a-fib    ASSESSMENT AND PLAN  69 year old male with h/o aortic root and aortic valve replacement at [facility] in 1993 with 25 mm St Jude valve conduit, who presented to [facility] yesterday with acute CHF presumably sec to valve dysfunction. TTE and TEE performed at [facility], but incomplete records sent. The patient was started on Dopamin drip for hypotension, discontinued the last night. He had elevated troponin 0.59, D-dimer at 6.7,  BNP 13.000. On physical exam the patient doesn't appear to be in acute distress, he has JVDs up to the jaws, minimal crackles at lung bases and minimal LE edema. He has significant holosystolic and diastolic murmur.  Echo shows : A 25 mm St Jude mechanical conduit valve is present at the aortic position. There are imited views and it seems that only one leaflet is opening. Transaortic gradients are severeley elevated. There is moderate to severe aortic insufficiency, no paravalvular leak. LV size and function is normal. Filling pressures couldn&'t be estimated. RV is moderately dilated with moderate systolic dysfunction and moderate pulmonary hypertension. RVSP 48 mmHg.  The patient hasn't been on Coumadin and doesn't remember any GI  bleed. The patient had an echo done in 2009 -with no AI at the time, and increased transaortic gradients peak 54 and mean 32 mmHg. He is on iv Heparin. His troponin is rising, however Crea 1.6.  We will perform fluoroscopy to evaluate for valvular motion and TEE today.  The patient is in rate controlled a-fib, ECG nonspecific ST-T wave are unchanged from 2011.  I would hold diuretics as his Crea is 1.6.   Dorothy Spark 06/14/2014   Corliss Blacker MD, [facility] 06/15/2014

## 2014-06-15 NOTE — Progress Notes (Signed)
Echocardiogram Echocardiogram Transesophageal has been performed.  Tresa Res 06/15/2014, 1:43 PM

## 2014-06-15 NOTE — Progress Notes (Signed)
Nutrition Brief Note  Patient identified on the Malnutrition Screening Tool (MST) Report for unsure weight loss.  Pt admitted with SOB and swelling. He has severe aortic valve insufficiency Pt currently in cath lab therefore unable to interview pt about weight loss.   Wt Readings from Last 15 Encounters:  06/15/14 223 lb (101.152 kg)  12/27/12 223 lb (101.152 kg)  11/30/12 223 lb 12.8 oz (101.515 kg)  01/13/10 236 lb (107.049 kg)    Body mass index is 32.92 kg/(m^2). Patient meets criteria for obesity class I based on current BMI.   Current diet order is NPO for surgery. Labs and medications reviewed.  Will follow up with pt to determine weight hx.   [place], [place], [place] Pager [phone] After Hours Pager

## 2014-06-15 NOTE — Progress Notes (Signed)
MD rounded on Pt. Will continue to hold Integrilin and flush urinary catheter with 100 CC per MD. Continue to monitor.

## 2014-06-15 NOTE — CV Procedure (Signed)
Fluroscopy Mechanical 25 mm St Jude Aortic Valve  Multiple Angles in LAO/RAO with cranial and caudal angulation Both leaflets with limited mobility. One leaflet appears non mobile  Other leaflet appears to only have limited motion of 30 degrees or so  Patient has had surgical consult and going for TEE now Has not been on coumadin for over 3 months  Jenkins Rouge

## 2014-06-15 NOTE — Consult Note (Signed)
[address]       [place] [address]             [phone]        Joshva W Weil [place] Medical Record #[id number] Date of Birth: 08-Feb-1945  Referring: No ref. provider found Primary Care: [name], MD  Chief Complaint:   No chief complaint on file.  shortness of breath-new onset heart failure, atrial fibrillation  Patient examined, transthoracic echocardiogram, cardiac fluoroscopy, and CT scan of chest all personally reviewed-TEE is pending  History of Present Illness:     I was asked to evaluate this 70 year old AA male for recent hospitalization for prosthetic aortic valve dysfunction. 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 [name]. 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 The coronary reconstruction was with a 8 mm Gore-Tex graft Cabral type repair. The patient had been on Coumadin for his mechanical valve but apparently stopped taking it 3 months ago. The patient states he was told to stop taking it and that he did not need it any longer. He denies any specific episodes of bleeding including GI bleeding that occurred prior to the cessation of his long-term Coumadin. There is no information regarding how long he has had atrial fibrillation.  The patient is an to the [facility] hospital because of decreasing exercise tolerance and increasing shortness of breath. He denies orthopnea PND or ankle edema. He denies chest pain.  The CT scan of the chest shows no pulmonary emboli. There is no vascular contrast of the aorta however the aortic root appears to be intact and there is a persistent false lumen of the distal arch and descending thoracic aorta with the proximal descending thoracic aortic diameter 5.0 cm.  Echocardiogram shows fairly well preserved LV function, mild-moderate MR, moderate-severe AS and AI. The mean gradient across the valve  is estimated at 50 mmHg  Fluoroscopy of the aortic valve shows 1 mechanical leaflet immobile and the other leaflet moving slightly less than normal  Current Activity/ Functional Status: Patient is not very active and has a stationary lifestyle  He lives with his wife who is disabled as well as a son. Zubrod Score: At the time of surgery this patient's most appropriate activity status/level should be described as: []     0    Normal activity, no symptoms []     1    Restricted in physical strenuous activity but ambulatory, able to do out light work []     2    Ambulatory and capable of self care, unable to do work activities, up and about                 more than 50%  Of the time                            [x]     3    Only limited self care, in bed greater than 50% of waking hours []     4    Completely disabled, no self care, confined to bed or chair []     5    Moribund  Past Medical History  Diagnosis Date  . Chest pain   . Hypertension   . Aortic aneurysm 1993  . 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 Pneumonia   .  Aortic valve replaced      At [facility]  . Hx of aortic aneurysm repair 06/14/2014    Past Surgical History  Procedure Laterality Date  . Abdominal aortic aneurysm repair  1993    At [facility], Unspecified  . Splenectomy    . Cardiac valve replacement      At [facility]  . Esophagogastroduodenoscopy  Aug 2014    [facility]: large duodenal ulcer with visible vessel, s/p clip and epi, erosive gastritis. +h.pylori serology, treated with amoxicillin and biaxin.   . Colonoscopy N/A 12/27/2012    Procedure: COLONOSCOPY;  Surgeon: Danie Binder, MD;  Location: [facility];  Service: Endoscopy;  Laterality: N/A;  8:45-moved to 855     History  Smoking status  . Former Smoker  Smokeless tobacco  . Not on file    History  Alcohol Use No    History   Social History  . Marital Status: Married    Spouse Name: N/A  . Number of Children: 3  .  Years of Education: N/A   Occupational History  . Retired    Social History Main Topics  . Smoking status: Former Research scientist (life sciences)  . Smokeless tobacco: Not on file  . Alcohol Use: No  . Drug Use: No  . Sexual Activity: Not on file   Other Topics Concern  . Not on file   Social History Narrative   Patient has two daughters and one son in good health    No Known Allergies  Current Facility-Administered Medications  Medication Dose Route Frequency Provider Last Rate Last Dose  . 0.9 %  sodium chloride infusion  250 mL Intravenous PRN Juanito Doom, MD      . 0.9 %  sodium chloride infusion   Intravenous Continuous Dorothy Spark, MD 20 mL/hr at 06/15/14 0920    . aspirin chewable tablet 81 mg  81 mg Oral Daily Dorothy Spark, MD      . atorvastatin (LIPITOR) tablet 40 mg  40 mg Oral q1800 Dorothy Spark, MD      . heparin ADULT infusion 100 units/mL (25000 units/250 mL)  1,300 Units/hr Intravenous Continuous Rush Farmer, MD 13 mL/hr at 06/15/14 1002 1,300 Units/hr at 06/15/14 1002  . pneumococcal 23 valent vaccine (PNU-IMMUNE) injection 0.5 mL  0.5 mL Intramuscular Tomorrow-1000 Brand Males, MD   0.5 mL at 06/15/14 1000    Prescriptions prior to admission  Medication Sig Dispense Refill Last Dose  . ferrous sulfate 325 (65 FE) MG EC tablet Take 325 mg by mouth every other day.    Past Week at Unknown time  . olmesartan-hydrochlorothiazide (BENICAR HCT) 20-12.5 MG per tablet Take 1 tablet by mouth 2 (two) times a week.    Past Week at Unknown time    Family History  Problem Relation Age of Onset  . Hypertension Father 54  . Diabetes Mother 26  . Colon cancer Neg Hx      Review of Systems:  The patient is somewhat sketchy on his past medical history and review of systems is difficult. He did have a splenectomy following his cardiac surgery for problems with platelet consumption. He denies any other thoracic surgical procedures or thoracic injuries in the past. He  did say he had a leaky blood vessel in his abdomen that was treated at [facility] sometime in the past.  The patient states he used to smoke but not recently. He also states he used to be a heavy  drinker but not currently.  The patient's blood work from [facility] was reviewed which shows mild elevation in the liver enzymes, normal amylase, creatinine mildly elevated 1.7.     Cardiac Review of Systems: Y or N  Chest Pain [  no  ]  Resting SOB [no   ] Exertional SOB  [  yes]  Orthopnea [no  ]   Pedal Edema [no   ]    Palpitations [no  ] Syncope  [no  ]   Presyncope [  no ]  General Review of Systems: [Y] = yes [  ]=no Constitional: recent weight change [  no]; anorexia [  ]; fatigue [place].Blacker  ]; nausea [  ]; night sweats [  ]; fever [no  ]; or chills [  ]                                                               Dental: poor dentition[yes-broken and necrotic teeth  ]; Last Dentist visit: Greater than one year  Eye : blurred vision [  ]; diplopia [   ]; vision changes [  ];  Amaurosis fugax[  ]; Resp: cough [  ];  wheezing[  ];  hemoptysis[  ]; shortness of breath[  ]; paroxysmal nocturnal dyspnea[  ]; dyspnea on exertion[ yes ]; or orthopnea[  ];  GI:  gallstones[  ], vomiting[  ];  dysphagia[  ]; melena[  ];  hematochezia [  ]; heartburn[  ];   Hx of  Colonoscopy[  ]; GU: kidney stones [  ]; hematuria[  ];   dysuria [  ];  nocturia[  ];  history of     obstruction [  ]; urinary frequency [  ]             Skin: rash, swelling[  ];, hair loss[  ];  peripheral edema[  ];  or itching[  ]; Musculosketetal: myalgias[  ];  joint swelling[  ];  joint erythema[  ];  joint pain[  ];  back pain[  ];  Heme/Lymph: bruising[  ];  bleeding[  ];  anemia[  ];  Neuro: TIA[  ];  headaches[  ];  stroke[  ];  vertigo[  ];  seizures[  ];   paresthesias[  ];  difficulty walking[  ];  Psych:depression[  ]; anxiety[  ];  Endocrine: diabetes[ no ];  thyroid dysfunction[  ];  Immunizations: Flu [  ]; Pneumococcal[   ];  Other: Right-hand dominant  Physical Exam: BP 89/55 mmHg  Pulse 81  Temp(Src) 97.5 F (36.4 C) (Oral)  Resp 19  Ht 5\' 9"  (1.753 m)  Wt 223 lb (101.152 kg)  BMI 32.92 kg/m2  SpO2 100%      Physical Exam  General: Middle-aged black male lying perfectly flat comfortable no distress HEENT: Normocephalic pupils equal , dentition poor Neck: Supple without JVD, adenopathy, or bruit Chest: Clear to auscultation, symmetrical breath sounds, no rhonchi, no tenderness             or deformity Cardiovascular: Irregular heart rhythm of atrial fibrillation, grade 2/6 systolic murmur, peripheral pulses are not             palpable in lower  extremities Abdomen:  Soft, nontender, no palpable mass or  organomegaly Extremities: Warm, well-perfused, no clubbing cyanosis edema or tenderness,              no venous stasis changes of the legs Rectal/GU: Deferred Neuro: Grossly non--focal and symmetrical throughout Skin: Clean and dry without rash or ulceration    Diagnostic Studies & Laboratory data:   Echocardiograms, CT scan of chest, cardiac fluoroscopy all personally reviewed  Recent Radiology Findings:   Dg Chest Port 1 View  06/14/2014   CLINICAL DATA:  CHF exacerbation, history hypertension and prior AVR  EXAM: PORTABLE CHEST - 1 VIEW  COMPARISON:  Portable exam 2010 hours compared to 06/13/2014  FINDINGS: Enlargement of cardiac silhouette post median sternotomy.  Tortuous aorta.  Mediastinal contours and pulmonary vascularity normal.  Minimal bibasilar atelectasis.  No gross infiltrate, pleural effusion or pneumothorax.  IMPRESSION: Enlargement of cardiac silhouette with minimal bibasilar atelectasis.   Electronically Signed   By: Lavonia Dana [name]   On: 06/14/2014 20:50     I have independently reviewed the above radiologic studies.  Recent Lab Findings: Lab Results  Component Value Date   WBC 9.3 06/15/2014   HGB 11.7* 06/15/2014   HCT 34.3* 06/15/2014   PLT 112* 06/15/2014    GLUCOSE 96 06/15/2014   NA 129* 06/15/2014   K 4.4 06/15/2014   CL 93* 06/15/2014   CREATININE 1.48* 06/15/2014   BUN 24* 06/15/2014   CO2 27 06/15/2014      Assessment-plan:  Prosthetic valve dysfunction secondary to inadequate anticoagulation Continue with IV heparin-add IV Integrilin for at least 48-72 hours and then re-echo  If the prosthetic valve dysfunction persists he will need repair of the current valve versus replacement-this will require a preoperative coronary angiogram because of his history of smoking, hypertension, and obesity. This was all explained to the patient I'll try to discuss this as well with his daughter.            @ME1 @ 06/15/2014 12:58 PM

## 2014-06-15 NOTE — CV Procedure (Signed)
PROCEDURE NOTE  Procedure:  Transesophageal echocardiogram Operator:  Fransico Him, MD Indications:  Mechanical AV failure Complications: IV Meds:  Results: Normal LV size and function with moderate LVH.  EF 60% Normal RV size and function Moderately dilated RA Severely dilated LA Normal TV with moderate TR Normal MV with moderate MR Normal PV St. Jude Mechanical AVR is present.  Only one of the leaflets can be visualized as moving and that movement is significantly reduced.  The other leaflet is immobile.  There is severe aortic insufficiency that is best appreciated in the 120 degree view.   Normal interatrial septum with ?of interatrial shunt by colorflow doppler.  Agitated saline contrast was injected but poor study.  It appears though that there is a very small shunt by saline contrast injection with microbubbles visualized in the LA by the 5th cardiac cycle There appears to be an aortic dissection flap in the descending thoracic aorta that extends up to the arch.  The patient tolerated the procedure well and was transported back to his room in stable condition.  Signed: Fransico Him, MD [facility] 06/15/2014

## 2014-06-16 ENCOUNTER — Inpatient Hospital Stay (HOSPITAL_COMMUNITY): Payer: Commercial Managed Care - HMO

## 2014-06-16 DIAGNOSIS — Q213 Tetralogy of Fallot: Secondary | ICD-10-CM

## 2014-06-16 LAB — CBC
HCT: 34.1 % — ABNORMAL LOW (ref 39.0–52.0)
HCT: 34.4 % — ABNORMAL LOW (ref 39.0–52.0)
HCT: 27.1 % — ABNORMAL LOW (ref 39.0–52.0)
HEMATOCRIT: 30.1 % — AB (ref 39.0–52.0)
HEMOGLOBIN: 11.4 g/dL — AB (ref 13.0–17.0)
Hemoglobin: 11.3 g/dL — ABNORMAL LOW (ref 13.0–17.0)
Hemoglobin: 9.8 g/dL — ABNORMAL LOW (ref 13.0–17.0)
Hemoglobin: 9.1 g/dL — ABNORMAL LOW (ref 13.0–17.0)
MCH: 31.8 pg (ref 26.0–34.0)
MCH: 31.7 pg (ref 26.0–34.0)
MCH: 31.4 pg (ref 26.0–34.0)
MCH: 31.5 pg (ref 26.0–34.0)
MCHC: 33.1 g/dL (ref 30.0–36.0)
MCHC: 33.1 g/dL (ref 30.0–36.0)
MCHC: 32.6 g/dL (ref 30.0–36.0)
MCHC: 33.6 g/dL (ref 30.0–36.0)
MCV: 96.1 fL (ref 78.0–100.0)
MCV: 95.6 fL (ref 78.0–100.0)
MCV: 96.5 fL (ref 78.0–100.0)
MCV: 93.8 fL (ref 78.0–100.0)
PLATELETS: 117 10*3/uL — AB (ref 150–400)
PLATELETS: 122 10*3/uL — AB (ref 150–400)
Platelets: 117 10*3/uL — ABNORMAL LOW (ref 150–400)
Platelets: 121 10*3/uL — ABNORMAL LOW (ref 150–400)
RBC: 3.55 MIL/uL — ABNORMAL LOW (ref 4.22–5.81)
RBC: 3.6 MIL/uL — ABNORMAL LOW (ref 4.22–5.81)
RBC: 3.12 MIL/uL — ABNORMAL LOW (ref 4.22–5.81)
RBC: 2.89 MIL/uL — ABNORMAL LOW (ref 4.22–5.81)
RDW: 14.2 % (ref 11.5–15.5)
RDW: 14.2 % (ref 11.5–15.5)
RDW: 14.4 % (ref 11.5–15.5)
RDW: 14 % (ref 11.5–15.5)
WBC: 9 10*3/uL (ref 4.0–10.5)
WBC: 8.1 10*3/uL (ref 4.0–10.5)
WBC: 7.9 10*3/uL (ref 4.0–10.5)
WBC: 8.1 10*3/uL (ref 4.0–10.5)

## 2014-06-16 LAB — CARBOXYHEMOGLOBIN
CARBOXYHEMOGLOBIN: 1.9 % — AB (ref 0.5–1.5)
METHEMOGLOBIN: 1 % (ref 0.0–1.5)
O2 SAT: 49 %
Total hemoglobin: 10.1 g/dL — ABNORMAL LOW (ref 13.5–18.0)

## 2014-06-16 LAB — HEMOGLOBIN A1C
Hgb A1c MFr Bld: 5.6 % (ref 4.8–5.6)
Hgb A1c MFr Bld: 5.6 % (ref 4.8–5.6)
MEAN PLASMA GLUCOSE: 114 mg/dL
Mean Plasma Glucose: 114 mg/dL

## 2014-06-16 LAB — HEPARIN LEVEL (UNFRACTIONATED)
HEPARIN UNFRACTIONATED: 0.42 [IU]/mL (ref 0.30–0.70)
Heparin Unfractionated: 0.47 IU/mL (ref 0.30–0.70)

## 2014-06-16 MED ORDER — FUROSEMIDE 10 MG/ML IJ SOLN
80.0000 mg | Freq: Two times a day (BID) | INTRAMUSCULAR | Status: DC
  Administered 2014-06-16: 80 mg via INTRAVENOUS
  Filled 2014-06-16 (×3): qty 8

## 2014-06-16 MED ORDER — NOREPINEPHRINE BITARTRATE 1 MG/ML IV SOLN
2.0000 ug/min | INTRAVENOUS | Status: DC
  Administered 2014-06-16: 2 ug/min via INTRAVENOUS
  Administered 2014-06-17: 10 ug/min via INTRAVENOUS
  Filled 2014-06-16 (×5): qty 16

## 2014-06-16 MED ORDER — FUROSEMIDE 10 MG/ML IJ SOLN: 40.0000 mg | Freq: Every day | INTRAMUSCULAR | Status: DC

## 2014-06-16 MED ORDER — METOLAZONE 2.5 MG PO TABS
2.5000 mg | ORAL_TABLET | Freq: Once | ORAL | Status: AC
  Administered 2014-06-16: 2.5 mg via ORAL
  Filled 2014-06-16: qty 1

## 2014-06-16 MED ORDER — DEXTROSE 5 % IV SOLN
2.0000 ug/min | INTRAVENOUS | Status: DC
  Filled 2014-06-16: qty 4

## 2014-06-16 NOTE — Progress Notes (Addendum)
06/16/2014 10:48 PM Moderate loose black/redish colored stool. Hgb 9.1. Called to Dr. Merrilee Seashore cards fellow on call. No changes. Cont to monitor. [name], Carolynn Comment

## 2014-06-16 NOTE — Progress Notes (Signed)
Pre-op Cardiac Surgery  Carotid Findings:  1-39% Left ICA stenosis.  Vertebral artery flow is antegrade.  Unable to visualize right ICA secondary to line in neck.  Upper Extremity Right Left  Brachial Pressures    Radial Waveforms    Ulnar Waveforms    Palmar Arch (Allen's Test)     Findings:      Lower  Extremity Right Left  Dorsalis Pedis    Anterior Tibial    Posterior Tibial    Ankle/Brachial Indices      Findings:

## 2014-06-16 NOTE — Progress Notes (Signed)
Dr. Haroldine Laws notified of hypotension.  Patient alert, no complaints of dizziness.  Will continue to monitor pt closely.

## 2014-06-16 NOTE — Procedures (Signed)
Central Venous Catheter Insertion Procedure Note [name] [id number] July 18, 1945  Procedure: Insertion of Central Venous Catheter Indications: Assessment of intravascular volume  Procedure Details Consent: Risks of procedure as well as the alternatives and risks of each were explained to the (patient/caregiver).  Consent for procedure obtained. Time Out: Verified patient identification, verified procedure, site/side was marked, verified correct patient position, special equipment/implants available, medications/allergies/relevent history reviewed, required imaging and test results available.  Performed  Maximum sterile technique was used including antiseptics, cap, gloves, gown, hand hygiene, mask and sheet. Skin prep: Chlorhexidine; local anesthetic administered A antimicrobial bonded/coated triple lumen catheter was placed in the right internal jugular vein using the Seldinger technique.  Evaluation Blood flow good Complications: No apparent complications Patient did tolerate procedure well. Chest X-ray ordered to verify placement.  CXR: pending.  [name] MD 06/16/2014, 12:59 PM

## 2014-06-16 NOTE — Progress Notes (Signed)
ANTICOAGULATION CONSULT NOTE - Follow Up Consult  Pharmacy Consult for heparin and integrilin Indication: chest pain/ACS, awaiting potential OHS; mechanical aortic valve thrombosis  No Known Allergies  Patient Measurements: Height: 5\' 9"  (175.3 cm) Weight: 223 lb (101.152 kg) IBW/kg (Calculated) : 70.7 Heparin Dosing Weight:92 kg  Vital Signs: Temp: 98 F (36.7 C) (05/21 0000) Temp Source: Oral (05/21 0000) BP: 110/79 mmHg (05/20 2300) Pulse Rate: 89 (05/20 2300)  Labs:  Recent Labs  06/14/14 2105  06/15/14 0112 06/15/14 0729 06/15/14 1336 06/15/14 1535 06/15/14 2322  HGB 12.3*  --   --  11.7*  --   --  11.3*  HCT 34.8*  --   --  34.3*  --   --  34.1*  PLT 122*  --   --  112*  --   --  117*  LABPROT  --   --   --   --  19.7*  --   --   INR  --   --   --   --  1.67*  --   --   HEPARINUNFRC  --   < > 0.52 0.79*  --  0.86* 0.47  CREATININE 1.60*  --   --  1.48* 1.39*  --   --   TROPONINI 1.28*  --  1.33* 1.09*  --   --   --   < > = values in this interval not displayed.  Estimated Creatinine Clearance: 60.5 mL/min (by C-G formula based on Cr of 1.39).   Medications:  Scheduled:  . aspirin  81 mg Oral Daily  . atorvastatin  40 mg Oral q1800  . pneumococcal 23 valent vaccine  0.5 mL Intramuscular Tomorrow-1000   Infusions:  . eptifibatide Stopped (06/15/14 1849)  . heparin 1,050 Units/hr (06/15/14 2000)    Assessment: 69 yo male with ACS and mechanical aortic valve thrombosis.  The 6 hour heparin level is 0.47 on 1050 units/hr.  CBC stable with Hgb 11.3 and pltc 117K. Currently on therapeutic heparin.  Integrelin was stopped at 18:40 yesterday 5/20 due to bleeding from foley.  Heparin drip continues.   Goal of Therapy:  Heparin level 0.3-0.7  (0.3-0.5 if integrilin resumed) Monitor platelets by anticoagulation protocol: Yes   Plan:  Contineu heparin 1050 units/hr Daily heparin level and CBC  Thank you for allowing pharmacy to be part of this patients care  team. Nicole Cella, RPh Clinical Pharmacist Pager: [phone] 06/16/2014,12:50 AM

## 2014-06-16 NOTE — Progress Notes (Signed)
Integrilin restarted per MD orders. MD notified of unchanged hematuria, urine remains red. Will continue to monitor urine.

## 2014-06-16 NOTE — Progress Notes (Signed)
Patient Name: [name] Date of Encounter: 06/16/2014  Principal Problem:   Aortic valve insufficiency Active Problems:   Cardiogenic shock, possible   Hx of aortic aneurysm repair   Atrial fibrillation, do not know length of time   Elevated troponin   Positive D dimer   CHF exacerbation   Length of Stay: 2  SUBJECTIVE  SBP in 70s.Now on heparin, eptifibatide and ASA for thrombosed mechanical AVR. Some bleeding from his nose- both nostrils, also bloody urine in his Foley- 134mls this am, bleeding started last night. Also spitting up blood. He denies dizziness.  Has not had a bowel movement since he has been here. Denies dyspnea  CURRENT MEDS . aspirin  81 mg Oral Daily  . atorvastatin  40 mg Oral q1800  . pneumococcal 23 valent vaccine  0.5 mL Intramuscular Tomorrow-1000   . eptifibatide 2 mcg/kg/min (06/16/14 2633)  . heparin 1,050 Units/hr (06/16/14 0800)   OBJECTIVE  Filed Vitals:   06/16/14 0600 06/16/14 0700 06/16/14 0739 06/16/14 0800  BP: 105/67 93/65 93/65  91/59  Pulse: 84 76 87 86  Temp:   97.9 F (36.6 C)   TempSrc:   Oral   Resp: 18 12 20 17   Height:      Weight: 225 lb 15.5 oz (102.5 kg)     SpO2: 100% 100% 100% 100%    Intake/Output Summary (Last 24 hours) at 06/16/14 0904 Last data filed at 06/16/14 0800  Gross per 24 hour  Intake 615.44 ml  Output    900 ml  Net -284.56 ml   Filed Weights   06/15/14 0415 06/15/14 1104 06/16/14 0600  Weight: 223 lb 5.2 oz (101.3 kg) 223 lb (101.152 kg) 225 lb 15.5 oz (102.5 kg)    PHYSICAL EXAM  General: Pleasant, NAD, lying in bed. Neuro: Alert and oriented X 3. Moves all extremities spontaneously. Psych: Normal and appropriate affect. HEENT:  Obvious bleeding from both nostrils,    Neck: Supple JVD up to the jaw, with distended neck veins and hepatojugular reflux. Lungs:  Resp regular and unlabored, rhonchorus breath sounds, without crackles.  Heart: Irregular  no s3, s4, crispmechanical valve  click- S2, holosystolic murmur, heard in all 4 areas, appears loudest in aortic region. Abdomen: Soft, non-tender, full, BS + x 4. Large ventral hernia  Extremities: 1+ pitting edema left leg, wearing TED hose.   CBC  Recent Labs  06/15/14 2322 06/16/14 0222  WBC 9.0 8.1  HGB 11.3* 11.4*  HCT 34.1* 34.4*  MCV 96.1 95.6  PLT 117* 354*   Basic Metabolic Panel  Recent Labs  06/15/14 0112 06/15/14 0729 06/15/14 1336  NA  --  129* 130*  K  --  4.4 4.4  CL  --  93* 93*  CO2  --  27 25  GLUCOSE  --  96 90  BUN  --  24* 21*  CREATININE  --  1.48* 1.39*  CALCIUM  --  9.2 9.0  MG 2.1  --   --     Recent Labs  06/14/14 2105 06/15/14 0112 06/15/14 0729  TROPONINI 1.28* 1.33* 1.09*   Radiology/Studies  Dg Chest Port 1 View  06/14/2014   CLINICAL DATA:  CHF exacerbation, history hypertension and prior AVR  EXAM: PORTABLE CHEST - 1 VIEW  COMPARISON:  Portable exam 2010 hours compared to 06/13/2014  FINDINGS: Enlargement of cardiac silhouette post median sternotomy.  Tortuous aorta.  Mediastinal contours and pulmonary vascularity normal.  Minimal bibasilar atelectasis.  No  gross infiltrate, pleural effusion or pneumothorax.  IMPRESSION: Enlargement of cardiac silhouette with minimal bibasilar atelectasis.   Electronically Signed   By: Lavonia Dana [name]   On: 06/14/2014 20:50   TELE: a-fib, rates- 80s- 90s.   Echo- TEE- 5/20- EF- 38-38%, Systolic function was normal. The estimated ejection fraction was in the range of 55% to 60%. Wall motion was normal; there were no regional wall motion abnormalities. - Aortic valve: St. Jude Mechanical AVR is present. Only one of the leaflets can be visualized as moving and that movement is significantly reduced. The other leaflet is immobile. There is severe aortic insufficiency that is best appreciated in the 120 degree view. - Aorta: There appears to be an aortic dissection flap in the descending thoracic aorta that extends  up to the arch.  ASSESSMENT AND PLAN  69 year old male with h/o aortic root and aortic valve replacement at [facility] in 1993 with 25 mm St Jude valve conduit, who presented to [facility] 5/18 with acute CHF presumably sec to valve dysfunction. TTE and TEE performed at [facility], but incomplete records sent. Pt was initially on dopamine for cardiogenic Shock. He had elevated troponin 0.59, D-dimer at 6.7, BNP- 1438.  13.000.  1. Prosthetic Aortic Valve dysfunction/ insuficiency- Due to compliance with Coumadin. CVTS evaluated pt, recs apprecaited- IV heparin, Epifibatide and Aspirin for 48-72hrs, then repeat Echo.  2. Cardiogenic Shock with diastolic CHF exacerbation- likely due to valve thrombosis - Dopamine stopped previous but now systolic BP in 18M.   - Start levophed  3. Hematuria and Epistaxis- trial of conservative treatment with anticoag and antiplatelets- Aspirin, epifibatide and IV heparin - Consider D/c epifibatide and Aspirin - CBC Q12H  4. Atria Fib- Rate controlled, unknown duration on IV heparin. Rate- 80s- 90s.   Plan-  1. Consider d/c antiplatelets.  2. Bp soft, no BB, no need for diuretics.  3. CBC- Q12H   Signed, Emokpae, Ejiroghene  IMTS, PGY-2 06/16/2014 10:05 AM   Patient seen and examined with Dr. Denton Brick. We discussed all aspects of the encounter. I agree with the assessment and plan as stated above.   He has very tenuous. SBP in 70s with prominent JVD in setting of prosthetic valve thrombosis.Currently on heparin, epifibatide and ASA with significant epistaxis and urinary bleeding. Will cut epifibatide to 1. Continue heparin and ASA. Will place central line and arterial line for better monitoring. Follow CBCs. Get type and screen.  Suspect clot is likely chronic and he will probably need high-risk surgery soon (with coronary angio prior). TCTS is on board.   The patient is critically ill with multiple organ systems failure and requires high  complexity decision making for assessment and support, frequent evaluation and titration of therapies, application of advanced monitoring technologies and extensive interpretation of multiple databases.   Critical Care Time devoted to patient care services described in this note is 45 Minutes.  Rylann Munford,MD 10:53 AM

## 2014-06-16 NOTE — Procedures (Signed)
Radial Arterial Line Insertion Procedure Note [name] [id number] 06/25/1945  Procedure: Insertion of Radial Artery Catheter Indications: Hemoydynamic monitoring  Procedure Details Consent: Risks of procedure as well as the alternatives and risks of each were explained to the (patient/caregiver).  Consent for procedure obtained. Time Out: Verified patient identification, verified procedure, site/side was marked, verified correct patient position, special equipment/implants available, medications/allergies/relevent history reviewed, required imaging and test results available.  Performed  Maximum sterile technique was used including antiseptics, cap, gloves, gown, hand hygiene, mask and sheet. Skin prep: Chlorhexidine; local anesthetic administered We were able to cannulate the right radial artery multiple times but unable to feed wire. We then prepped and draped left wrist. With the use of an arterial line kit,  An antimicrobial bonded/coated single lumen catheter was placed in the left radial artery using the Seldinger technique.  Evaluation Blood flow good. Waveform good. Complications: No apparent complications Patient did tolerate procedure well.   [name] MD 06/16/2014, 1:00 PM

## 2014-06-16 NOTE — Progress Notes (Addendum)
ANTICOAGULATION CONSULT NOTE - Follow Up Consult  Pharmacy Consult for heparin and integrilin Indication: chest pain/ACS, awaiting potential OHS; mechanical aortic valve thrombosis  No Known Allergies  Patient Measurements: Height: 5\' 9"  (175.3 cm) Weight: 225 lb 15.5 oz (102.5 kg) IBW/kg (Calculated) : 70.7 Heparin Dosing Weight:92 kg  Vital Signs: Temp: 97.9 F (36.6 C) (05/21 0739) Temp Source: Oral (05/21 0739) BP: 91/59 mmHg (05/21 0800) Pulse Rate: 86 (05/21 0800)  Labs:  Recent Labs  06/14/14 2105  06/15/14 0112 06/15/14 0729 06/15/14 1336 06/15/14 1535 06/15/14 2322 06/16/14 0222  HGB 12.3*  --   --  11.7*  --   --  11.3* 11.4*  HCT 34.8*  --   --  34.3*  --   --  34.1* 34.4*  PLT 122*  --   --  112*  --   --  117* 117*  LABPROT  --   --   --   --  19.7*  --   --   --   INR  --   --   --   --  1.67*  --   --   --   HEPARINUNFRC  --   < > 0.52 0.79*  --  0.86* 0.47 0.42  CREATININE 1.60*  --   --  1.48* 1.39*  --   --   --   TROPONINI 1.28*  --  1.33* 1.09*  --   --   --   --   < > = values in this interval not displayed.  Estimated Creatinine Clearance: 60.8 mL/min (by C-G formula based on Cr of 1.39).   Medications:  Scheduled:  . aspirin  81 mg Oral Daily  . atorvastatin  40 mg Oral q1800  . pneumococcal 23 valent vaccine  0.5 mL Intramuscular Tomorrow-1000   Infusions:  . eptifibatide 2 mcg/kg/min (06/16/14 0429)  . heparin 1,050 Units/hr (06/15/14 2000)    Assessment: 69 yo male with ACS and mechanical aortic valve thrombosis. HL therapeutic this AM x2 (0.42) on 1050 units/hr. To follow daily HL now. CBC stable with Hgb 11.1 and pltc 117.  Integrelin was stopped at 18:40 yesterday 5/20 due to bleeding from foley. Restarted per MD @ 60mcg/kg/hr. Heparin drip continues.   5/21: Hematuria continues per RN note in AM - MD made aware. To cut integrelin in half and continue to monitor  Goal of Therapy:  Heparin level 0.3-0.7  (0.3-0.5 if integrilin  resumed) Monitor platelets by anticoagulation protocol: Yes   Plan:  -Continue heparin IV @1050  units/hr -Daily HL/CBC -Mon s/sx increased bleed -Integrelin cut in half to 1 mcg/kg/hr per MD  Elicia Lamp, PharmD Clinical Pharmacist - Resident Pager [phone] 06/16/2014 8:39 AM

## 2014-06-16 NOTE — Progress Notes (Signed)
MD came by to see PT. In AM. Aware of hematuria. Continue integrilin and continue to monitor urine.

## 2014-06-16 NOTE — Progress Notes (Signed)
Dr. Haroldine Laws notified of hemoglobin now 9.8 from 11.4 at 0330.  Hemoglobin on mixed venous gas 10.1.  Orders received.  Will continue to monitor pt closely.

## 2014-06-16 NOTE — Progress Notes (Signed)
Dr. Haroldine Laws notified of mixed venous O2 saturation 49%.  Orders received, will continue to monitor pt closely.

## 2014-06-17 DIAGNOSIS — I482 Chronic atrial fibrillation: Secondary | ICD-10-CM

## 2014-06-17 DIAGNOSIS — K2961 Other gastritis with bleeding: Secondary | ICD-10-CM

## 2014-06-17 LAB — BASIC METABOLIC PANEL
Anion gap: 7 (ref 5–15)
BUN: 58 mg/dL — ABNORMAL HIGH (ref 6–20)
CO2: 24 mmol/L (ref 22–32)
CREATININE: 1.32 mg/dL — AB (ref 0.61–1.24)
Calcium: 7.9 mg/dL — ABNORMAL LOW (ref 8.9–10.3)
Chloride: 98 mmol/L — ABNORMAL LOW (ref 101–111)
GFR calc Af Amer: >60 mL/min (ref >60)
GFR calc non Af Amer: 54 mL/min — ABNORMAL LOW (ref >60)
Glucose, Bld: 139 mg/dL — ABNORMAL HIGH (ref 65–99)
POTASSIUM: 4.8 mmol/L (ref 3.5–5.1)
SODIUM: 129 mmol/L — AB (ref 135–145)

## 2014-06-17 LAB — CBC
HCT: 21.6 % — ABNORMAL LOW (ref 39.0–52.0)
HCT: 25.5 % — ABNORMAL LOW (ref 39.0–52.0)
HCT: 25.3 % — ABNORMAL LOW (ref 39.0–52.0)
HEMATOCRIT: 24.3 % — AB (ref 39.0–52.0)
HEMOGLOBIN: 7.4 g/dL — AB (ref 13.0–17.0)
HEMOGLOBIN: 8.6 g/dL — AB (ref 13.0–17.0)
HEMOGLOBIN: 8.6 g/dL — AB (ref 13.0–17.0)
Hemoglobin: 8.4 g/dL — ABNORMAL LOW (ref 13.0–17.0)
MCH: 32.7 pg (ref 26.0–34.0)
MCH: 32.3 pg (ref 26.0–34.0)
MCH: 29.8 pg (ref 26.0–34.0)
MCH: 29.8 pg (ref 26.0–34.0)
MCHC: 34.6 g/dL (ref 30.0–36.0)
MCHC: 34.3 g/dL (ref 30.0–36.0)
MCHC: 33.7 g/dL (ref 30.0–36.0)
MCHC: 34 g/dL (ref 30.0–36.0)
MCV: 94.6 fL (ref 78.0–100.0)
MCV: 94.3 fL (ref 78.0–100.0)
MCV: 88.2 fL (ref 78.0–100.0)
MCV: 87.5 fL (ref 78.0–100.0)
Platelets: 120 10*3/uL — ABNORMAL LOW (ref 150–400)
Platelets: 119 10*3/uL — ABNORMAL LOW (ref 150–400)
Platelets: 114 10*3/uL — ABNORMAL LOW (ref 150–400)
Platelets: 120 10*3/uL — ABNORMAL LOW (ref 150–400)
RBC: 2.57 MIL/uL — ABNORMAL LOW (ref 4.22–5.81)
RBC: 2.29 MIL/uL — ABNORMAL LOW (ref 4.22–5.81)
RBC: 2.89 MIL/uL — AB (ref 4.22–5.81)
RBC: 2.89 MIL/uL — ABNORMAL LOW (ref 4.22–5.81)
RDW: 14 % (ref 11.5–15.5)
RDW: 13.9 % (ref 11.5–15.5)
RDW: 17.5 % — AB (ref 11.5–15.5)
RDW: 17.8 % — ABNORMAL HIGH (ref 11.5–15.5)
WBC: 8.3 10*3/uL (ref 4.0–10.5)
WBC: 8.5 10*3/uL (ref 4.0–10.5)
WBC: 8.7 10*3/uL (ref 4.0–10.5)
WBC: 9.3 10*3/uL (ref 4.0–10.5)

## 2014-06-17 LAB — CARBOXYHEMOGLOBIN
CARBOXYHEMOGLOBIN: 2.3 % — AB (ref 0.5–1.5)
Carboxyhemoglobin: 2 % — ABNORMAL HIGH (ref 0.5–1.5)
Methemoglobin: 1.3 % (ref 0.0–1.5)
Methemoglobin: 1.1 % (ref 0.0–1.5)
O2 Saturation: 98.9 %
O2 Saturation: 48.8 %
TOTAL HEMOGLOBIN: 7.7 g/dL — AB (ref 13.5–18.0)
Total hemoglobin: 8.4 g/dL — ABNORMAL LOW (ref 13.5–18.0)

## 2014-06-17 LAB — PREPARE RBC (CROSSMATCH)

## 2014-06-17 LAB — HEPARIN LEVEL (UNFRACTIONATED): Heparin Unfractionated: 0.35 IU/mL (ref 0.30–0.70)

## 2014-06-17 MED ORDER — SODIUM CHLORIDE 0.9 % IV SOLN
Freq: Once | INTRAVENOUS | Status: AC
  Administered 2014-06-17: 08:00:00 via INTRAVENOUS

## 2014-06-17 MED ORDER — FUROSEMIDE 10 MG/ML IJ SOLN: 40.0000 mg | Freq: Once | INTRAMUSCULAR | Status: DC

## 2014-06-17 MED ORDER — PANTOPRAZOLE SODIUM 40 MG IV SOLR
40.0000 mg | Freq: Two times a day (BID) | INTRAVENOUS | Status: DC
  Administered 2014-06-17 – 2014-06-20 (×8): 40 mg via INTRAVENOUS
  Filled 2014-06-17 (×10): qty 40

## 2014-06-17 NOTE — Progress Notes (Signed)
06/17/2014 0220  Hgb 8.4 called to Dr. Levin Erp. Integrilin gtt stopped. Leandra Vanderweele, Carolynn Comment

## 2014-06-17 NOTE — Progress Notes (Addendum)
Patient Name: [name] Date of Encounter: 06/17/2014   SUBJECTIVE  69 y/o male admitted with cardiogenic shock due to partially thrombosed mechanical AoV after stopping coumadin for unclear reasons several months ago. Had duodenal ulcer in 2014 clipped at [facility]  Integrelin cut back yesterday am due to hematuria. Last night developed melena. Integrelin and heparin stopped. Melena has slowed.  Hgb down to 7.4. Transfused. Remains on pressors. Diuresing well. No BMET today.  Denies dyspnea. CVP 6-7. On levophed 20.     CURRENT MEDS . aspirin  81 mg Oral Daily  . atorvastatin  40 mg Oral q1800  . furosemide  40 mg Intravenous Once  . furosemide  80 mg Intravenous BID   . norepinephrine (LEVOPHED) Adult infusion 20 mcg/min (06/17/14 0751)   OBJECTIVE  Filed Vitals:   06/17/14 0730 06/17/14 0745 06/17/14 0800 06/17/14 0900  BP:   98/57 95/63  Pulse: 92 93 94 88  Temp:   99.1 F (37.3 C)   TempSrc:   Oral   Resp: 15 16 14 21   Height:      Weight:      SpO2: 100% 100% 100% 100%    Intake/Output Summary (Last 24 hours) at 06/17/14 0958 Last data filed at 06/17/14 0900  Gross per 24 hour  Intake 1249.33 ml  Output   2725 ml  Net -1475.67 ml   Filed Weights   06/15/14 1104 06/16/14 0600 06/17/14 0443  Weight: 101.152 kg (223 lb) 102.5 kg (225 lb 15.5 oz) 99 kg (218 lb 4.1 oz)    PHYSICAL EXAM  General: NAD, lying in bed. Neuro: Alert and oriented X 3. Moves all extremities spontaneously. Psych: Normal and appropriate affect. HEENT:  normal Neck: Supple JVP flat, with distended neck veins and hepatojugular reflux. Lungs:  Resp regular and unlabored, rhonchorus breath sounds, without crackles.  Heart: Irregular  no s3, s4, crispmechanical valve click- S2, holosystolic murmur, heard in all 4 areas, appears loudest in aortic region. Abdomen: Soft, non-tender, full, BS + x 4. Large ventral hernia  Extremities: tr pitting edema left leg, wearing TED hose.    CBC  Recent Labs  06/17/14 0140 06/17/14 0602  WBC 8.3 8.5  HGB 8.4* 7.4*  HCT 24.3* 21.6*  MCV 94.6 94.3  PLT 120* 097*   Basic Metabolic Panel  Recent Labs  06/15/14 0112 06/15/14 0729 06/15/14 1336  NA  --  129* 130*  K  --  4.4 4.4  CL  --  93* 93*  CO2  --  27 25  GLUCOSE  --  96 90  BUN  --  24* 21*  CREATININE  --  1.48* 1.39*  CALCIUM  --  9.2 9.0  MG 2.1  --   --     Recent Labs  06/14/14 2105 06/15/14 0112 06/15/14 0729  TROPONINI 1.28* 1.33* 1.09*   Radiology/Studies  Dg Chest Port 1 View  06/16/2014   CLINICAL DATA:  Central line placement.  EXAM: PORTABLE CHEST - 1 VIEW  COMPARISON:  06/14/2014  FINDINGS: New right jugular central line is been placed with the tip in the upper right atrium. No pneumothorax. Stable volume loss of the right lung with slightly more prominent mid lung atelectasis. Mild interstitial edema suspected. No pleural effusions. Stable cardiac enlargement.  IMPRESSION: Central line tip in upper right atrium. No pneumothorax. Slightly more prominent right mid lung atelectasis. Mild interstitial edema suspected.   Electronically Signed   By: Jenness Corner.D.  On: 06/16/2014 13:58   Dg Chest Port 1 View  06/14/2014   CLINICAL DATA:  CHF exacerbation, history hypertension and prior AVR  EXAM: PORTABLE CHEST - 1 VIEW  COMPARISON:  Portable exam 2010 hours compared to 06/13/2014  FINDINGS: Enlargement of cardiac silhouette post median sternotomy.  Tortuous aorta.  Mediastinal contours and pulmonary vascularity normal.  Minimal bibasilar atelectasis.  No gross infiltrate, pleural effusion or pneumothorax.  IMPRESSION: Enlargement of cardiac silhouette with minimal bibasilar atelectasis.   Electronically Signed   By: Lavonia Dana [name]   On: 06/14/2014 20:50   TELE: a-fib, rates- 80s- 90s.   Echo- TEE- 5/20- EF- 16-38%, Systolic function was normal. The estimated ejection fraction was in the range of 55% to 60%. Wall motion  was normal; there were no regional wall motion abnormalities. - Aortic valve: St. Jude Mechanical AVR is present. Only one of the leaflets can be visualized as moving and that movement is significantly reduced. The other leaflet is immobile. There is severe aortic insufficiency that is best appreciated in the 120 degree view. 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  ASSESSMENT AND PLAN  69 year old male with h/o aortic root and aortic valve replacement at [facility] in 1993 with 25 mm St Jude valve conduit, who presented to [facility] 5/18 with acute CHF presumably sec to valve dysfunction. TTE and TEE performed at [facility], but incomplete records sent. Pt was initially on dopamine for cardiogenic Shock. He had elevated troponin 0.59, D-dimer at 6.7, BNP- 1438.  13.000.  1. 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 Cardiogenic Shock with diastolic CHF exacerbation- likely due to valve thrombosis 3. Acute GI bleed with melena with h/o duodenal ulcer 2014 4. Chronic Atrial Fib 5. Thrombocytopenia 6. 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 The coronary reconstruction was with a 8 mm Gore-Tex graft Cabral type repair in [place] on levophed. Has diuresed well overnight and breathing better. CVP low. Will hold lasix for now. Can give after transfusion if CVP > = 12  GI bleed seems to have slowed. Now off heparin and integrilin. Transfusing now. Have asked GI to see. Treat with IV protonix.   Case discussed with Dr. Roxan Hockey at bedside. Suspect clot is likely chronic and he will probably need high-risk valve debridement.  Hold anticoagulation today. GI to see. Hopefully can have endo soon. Plan cath probably Tuesday.   The patient is critically ill with multiple organ systems failure and requires  high complexity decision making for assessment and support, frequent evaluation and titration of therapies, application of advanced monitoring technologies and extensive interpretation of multiple databases.   Critical Care Time devoted to patient care services described in this note is 45 Minutes.  With profound shock, the advanced HF team will assume care.   Emanual Lamountain,MD 9:58 AM

## 2014-06-17 NOTE — Progress Notes (Signed)
6:29 AM 06/17/2014 Hgb called to Dr. Levin Erp. Heparin stopped. Levo at 15 mcg. Physician will put in orders for transfusion. Bonney Berres, Carolynn Comment

## 2014-06-17 NOTE — Progress Notes (Signed)
06/17/2014 1:53 AM  Still having dark maroon colored stools with clots. Paged Dr. Levin Erp.  CBC sent to lab. [name], Carolynn Comment

## 2014-06-17 NOTE — Consult Note (Addendum)
Consult Note for [name]  Reason for Consult: [name] bleed Referring Physician: Cardiology  Assunta Found HPI: This is a 69 year old male with a PMH of a Stanford tyep A ascending aortic dissectin in 1993, aortic root replacement with a 25 mm St. Jude mechanical valve, HTN, and duodenal ulcer with significant bleed treated at [facility] 08/2012 admitted with cardiogenic shock.  The patient was told to stop coumadin 3 months ago and as a result he developed a thrombus that is inhibiting the functioning of the aortic valve.  He has severe aortic insufficiency.  As a result of the thrombus he was started on anticoagulation and subsequently he started to have episodes of melena with a concurrent drop in his HGB.  The anticoagulation was stopped and his bleeding has stopped.  In August 2014 he presented to [facility] with an acute [name] bleed.  At that time he was drinking heavily and using NSAIDs.  The EGD revealed an acute gastritis and a large duodenal bulb ulcer with a visible vessel.  The vessel was injected with Epi and hemoclipped.  Additionally, he was found to be H. Pylori positive and the completed treatment.  Last evening, his last melenic bowel movement was at 2 AM and Nursing just cleaned him up from a 500 ml melenic bowel movement.  No change with his blood pressure.  Past Medical History  Diagnosis Date  . Chest pain   . Hypertension   . Aortic aneurysm 1993  . 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 Pneumonia   . Aortic valve replaced      At [facility]  . Hx of aortic aneurysm repair 06/14/2014    Past Surgical History  Procedure Laterality Date  . Abdominal aortic aneurysm repair  1993    At [facility], Unspecified  . Splenectomy    . Cardiac valve replacement      At [facility]  . Esophagogastroduodenoscopy  Aug 2014    [facility]: large duodenal ulcer with visible vessel, s/p clip and epi, erosive gastritis. +h.pylori serology, treated with  amoxicillin and biaxin.   . Colonoscopy N/A 12/27/2012    Procedure: COLONOSCOPY;  Surgeon: Danie Binder, MD;  Location: [facility];  Service: Endoscopy;  Laterality: N/A;  8:45-moved to 76     Family History  Problem Relation Age of Onset  . Hypertension Father 76  . Diabetes Mother 71  . Colon cancer Neg Hx     Social History:  reports that he has quit smoking. He does not have any smokeless tobacco history on file. He reports that he does not drink alcohol or use illicit drugs.  Allergies: No Known Allergies  Medications:  Scheduled: . aspirin  81 mg Oral Daily  . atorvastatin  40 mg Oral q1800  . furosemide  40 mg Intravenous Once  . pantoprazole (PROTONIX) IV  40 mg Intravenous BID   Continuous: . norepinephrine (LEVOPHED) Adult infusion 20 mcg/min (06/17/14 0751)    Results for orders placed or performed during the hospital encounter of 06/14/14 (from the past 24 hour(s))  Carboxyhemoglobin     Status: Abnormal   Collection Time: 06/16/14  4:00 PM  Result Value Ref Range   Total hemoglobin 10.1 (L) 13.5 - 18.0 g/dL   O2 Saturation 49.0 %   Carboxyhemoglobin 1.9 (H) 0.5 - 1.5 %   Methemoglobin 1.0 0.0 - 1.5 %  CBC     Status: Abnormal   Collection  Time: 06/16/14  5:25 PM  Result Value Ref Range   WBC 7.9 4.0 - 10.5 K/uL   RBC 3.12 (L) 4.22 - 5.81 MIL/uL   Hemoglobin 9.8 (L) 13.0 - 17.0 g/dL   HCT 30.1 (L) 39.0 - 52.0 %   MCV 96.5 78.0 - 100.0 fL   MCH 31.4 26.0 - 34.0 pg   MCHC 32.6 30.0 - 36.0 g/dL   RDW 14.4 11.5 - 15.5 %   Platelets 122 (L) 150 - 400 K/uL  CBC     Status: Abnormal   Collection Time: 06/16/14 10:09 PM  Result Value Ref Range   WBC 8.1 4.0 - 10.5 K/uL   RBC 2.89 (L) 4.22 - 5.81 MIL/uL   Hemoglobin 9.1 (L) 13.0 - 17.0 g/dL   HCT 27.1 (L) 39.0 - 52.0 %   MCV 93.8 78.0 - 100.0 fL   MCH 31.5 26.0 - 34.0 pg   MCHC 33.6 30.0 - 36.0 g/dL   RDW 14.0 11.5 - 15.5 %   Platelets 121 (L) 150 - 400 K/uL  CBC     Status: Abnormal   Collection  Time: 06/17/14  1:40 AM  Result Value Ref Range   WBC 8.3 4.0 - 10.5 K/uL   RBC 2.57 (L) 4.22 - 5.81 MIL/uL   Hemoglobin 8.4 (L) 13.0 - 17.0 g/dL   HCT 24.3 (L) 39.0 - 52.0 %   MCV 94.6 78.0 - 100.0 fL   MCH 32.7 26.0 - 34.0 pg   MCHC 34.6 30.0 - 36.0 g/dL   RDW 14.0 11.5 - 15.5 %   Platelets 120 (L) 150 - 400 K/uL  Heparin level (unfractionated)     Status: None   Collection Time: 06/17/14  2:20 AM  Result Value Ref Range   Heparin Unfractionated 0.35 0.30 - 0.70 IU/mL  Carboxyhemoglobin     Status: Abnormal   Collection Time: 06/17/14  4:25 AM  Result Value Ref Range   Total hemoglobin 7.7 (L) 13.5 - 18.0 g/dL   O2 Saturation 98.9 %   Carboxyhemoglobin 2.3 (H) 0.5 - 1.5 %   Methemoglobin 1.3 0.0 - 1.5 %  CBC     Status: Abnormal   Collection Time: 06/17/14  6:02 AM  Result Value Ref Range   WBC 8.5 4.0 - 10.5 K/uL   RBC 2.29 (L) 4.22 - 5.81 MIL/uL   Hemoglobin 7.4 (L) 13.0 - 17.0 g/dL   HCT 21.6 (L) 39.0 - 52.0 %   MCV 94.3 78.0 - 100.0 fL   MCH 32.3 26.0 - 34.0 pg   MCHC 34.3 30.0 - 36.0 g/dL   RDW 13.9 11.5 - 15.5 %   Platelets 119 (L) 150 - 400 K/uL  Prepare RBC     Status: None   Collection Time: 06/17/14  6:44 AM  Result Value Ref Range   Order Confirmation ORDER PROCESSED BY BLOOD BANK      Dg Chest Port 1 View  06/16/2014   CLINICAL DATA:  Central line placement.  EXAM: PORTABLE CHEST - 1 VIEW  COMPARISON:  06/14/2014  FINDINGS: New right jugular central line is been placed with the tip in the upper right atrium. No pneumothorax. Stable volume loss of the right lung with slightly more prominent mid lung atelectasis. Mild interstitial edema suspected. No pleural effusions. Stable cardiac enlargement.  IMPRESSION: Central line tip in upper right atrium. No pneumothorax. Slightly more prominent right mid lung atelectasis. Mild interstitial edema suspected.   Electronically Signed   By: Eulas Post  Kathlene Cote [name]   On: 06/16/2014 13:58    ROS:  As stated above in the HPI  otherwise negative.  Blood pressure 103/59, pulse 92, temperature 99.3 F (37.4 C), temperature source Oral, resp. rate 17, height 5\' 9"  (1.753 m), weight 99 kg (218 lb 4.1 oz), SpO2 100 %.    PE: Gen: NAD, Alert and Oriented, fatigued ABM: Soft, NTND, +BS Ext: No C/C/E  Assessment/Plan: 1) History of a duodenal ulcer s/p clipping. 2) Anemia and melena on anticoagulation. 3) Severe AI.   It has been almost 12 hours between his melenic bowel movements.  His pressors were increased a 1-2 hours ago, but he remains hemodynamically stable.  If he should decompensate today or this evening, I will perform an emergent EGD, however, at this time, he will be posted for a procedure in the AM with Dr. Hilarie Fredrickson.  Plan: 1) EGD in the AM with Dr. Hilarie Fredrickson. 2) Follow HGB and transfuse as necessary. 3) Continue with Protonix.  Tessy Pawelski D 06/17/2014, 12:29 PM

## 2014-06-17 NOTE — Progress Notes (Signed)
Discussed with patient signs and symptoms of blood reaction. Pt able to verbally tell what symptoms he is to notify the nurse for. Pt has not had a blood reaction in the past. Will monitor closely.

## 2014-06-17 NOTE — Progress Notes (Signed)
2 Days Post-Op Procedure(s) (LRB): TRANSESOPHAGEAL ECHOCARDIOGRAM (TEE) (N/A) Subjective: Denies pain and shortness of breath  Objective: Vital signs in last 24 hours: Temp:  [97.6 F (36.4 C)-99.1 F (37.3 C)] 99.1 F (37.3 C) (05/22 0800) Pulse Rate:  [82-99] 88 (05/22 0900) Cardiac Rhythm:  [-] Atrial fibrillation (05/22 0800) Resp:  [10-28] 21 (05/22 0900) BP: (69-98)/(43-63) 95/63 mmHg (05/22 0900) SpO2:  [100 %] 100 % (05/22 0900) Arterial Line BP: (78-135)/(44-72) 110/55 mmHg (05/22 0900) Weight:  [218 lb 4.1 oz (99 kg)] 218 lb 4.1 oz (99 kg) (05/22 0443)  Hemodynamic parameters for last 24 hours: CVP:  [8 mmHg-16 mmHg] 8 mmHg  Intake/Output from previous day: 05/21 0701 - 05/22 0700 In: 1791 [P.O.:840; I.V.:639] Out: 2875 [Urine:2875] Intake/Output this shift: Total I/O In: 63.8 [I.V.:33.8; Blood:30] Out: -   General appearance: alert and no distress Neurologic: intact Heart: irregularly irregular rhythm and + murmur Lungs: clear to auscultation bilaterally Abdomen: normal findings: soft, non-tender  Lab Results:  Recent Labs  06/17/14 0140 06/17/14 0602  WBC 8.3 8.5  HGB 8.4* 7.4*  HCT 24.3* 21.6*  PLT 120* 119*   BMET:  Recent Labs  06/15/14 0729 06/15/14 1336  NA 129* 130*  K 4.4 4.4  CL 93* 93*  CO2 27 25  GLUCOSE 96 90  BUN 24* 21*  CREATININE 1.48* 1.39*  CALCIUM 9.2 9.0    PT/INR:  Recent Labs  06/15/14 1336  LABPROT 19.7*  INR 1.67*   ABG    Component Value Date/Time   PHART 7.370 06/15/2014 1421   HCO3 23.2 06/15/2014 1421   TCO2 24 06/15/2014 1421   ACIDBASEDEF 2.0 06/15/2014 1421   O2SAT 98.9 06/17/2014 0425   CBG (last 3)  No results for input(s): GLUCAP in the last 72 hours.  Assessment/Plan: S/P Procedure(s) (LRB): TRANSESOPHAGEAL ECHOCARDIOGRAM (TEE) (N/A) -  68 yo man with a partially thrombosed mechanical aortic valve.  Heparin and integrelin stopped due to GI bleeding overnight- will probably need a GI  consult  Being transfused for anemia this AM  Renal function stable and no clinical symptoms of CHF   LOS: 3 days    [name] 06/17/2014

## 2014-06-17 NOTE — Progress Notes (Signed)
ANTICOAGULATION CONSULT NOTE - Follow Up Consult  Pharmacy Consult for heparin Indication: CAD and AV thrombosis  Labs:  Recent Labs  06/14/14 2105  06/15/14 0112 06/15/14 0729 06/15/14 1336  06/15/14 2322 06/16/14 0222 06/16/14 1725 06/16/14 2209 06/17/14 0140 06/17/14 0220  HGB 12.3*  --   --  11.7*  --   --  11.3* 11.4* 9.8* 9.1* 8.4*  --   HCT 34.8*  --   --  34.3*  --   --  34.1* 34.4* 30.1* 27.1* 24.3*  --   PLT 122*  --   --  112*  --   --  117* 117* 122* 121* 120*  --   LABPROT  --   --   --   --  19.7*  --   --   --   --   --   --   --   INR  --   --   --   --  1.67*  --   --   --   --   --   --   --   HEPARINUNFRC  --   < > 0.52 0.79*  --   < > 0.47 0.42  --   --   --  0.35  CREATININE 1.60*  --   --  1.48* 1.39*  --   --   --   --   --   --   --   TROPONINI 1.28*  --  1.33* 1.09*  --   --   --   --   --   --   --   --   < > = values in this interval not displayed.    Assessment/Plan:  69yo male remains therapeutic on heparin; at low end of goal, which is appropriate given current bleeding issues.  RN reports maroon-colored liquid stools, some w/ clots, Hgb down to 8.4 (12.3 on admission 5/19).  Integrilin has since been d/c'd; earlier in admission when pt was having hematuria Integrilin was held w/ resolution of frank blood in urine (remains amber, per RN).  No further reported incidents, holding Integrilin will hopefully alleviate rectal blood loss.  Will continue gtt at current rate and confirm stable with additional level to watch closely.   Wynona Neat, PharmD, BCPS  06/17/2014,4:35 AM

## 2014-06-17 NOTE — Progress Notes (Signed)
ANTICOAGULATION CONSULT NOTE - Follow Up Consult  Pharmacy Consult for heparin and integrilin Indication: chest pain/ACS, awaiting potential OHS; mechanical aortic valve thrombosis  No Known Allergies  Patient Measurements: Height: 5\' 9"  (175.3 cm) Weight: 218 lb 4.1 oz (99 kg) IBW/kg (Calculated) : 70.7 Heparin Dosing Weight:92 kg  Vital Signs: Temp: 99.1 F (37.3 C) (05/22 0800) Temp Source: Oral (05/22 0800) BP: 98/57 mmHg (05/22 0800) Pulse Rate: 94 (05/22 0800)  Labs:  Recent Labs  06/14/14 2105  06/15/14 0112 06/15/14 0729 06/15/14 1336  06/15/14 2322 06/16/14 0222  06/16/14 2209 06/17/14 0140 06/17/14 0220 06/17/14 0602  HGB 12.3*  --   --  11.7*  --   --  11.3* 11.4*  < > 9.1* 8.4*  --  7.4*  HCT 34.8*  --   --  34.3*  --   --  34.1* 34.4*  < > 27.1* 24.3*  --  21.6*  PLT 122*  --   --  112*  --   --  117* 117*  < > 121* 120*  --  119*  LABPROT  --   --   --   --  19.7*  --   --   --   --   --   --   --   --   INR  --   --   --   --  1.67*  --   --   --   --   --   --   --   --   HEPARINUNFRC  --   < > 0.52 0.79*  --   < > 0.47 0.42  --   --   --  0.35  --   CREATININE 1.60*  --   --  1.48* 1.39*  --   --   --   --   --   --   --   --   TROPONINI 1.28*  --  1.33* 1.09*  --   --   --   --   --   --   --   --   --   < > = values in this interval not displayed.  Estimated Creatinine Clearance: 59.8 mL/min (by C-G formula based on Cr of 1.39).   Medications:  Scheduled:  . aspirin  81 mg Oral Daily  . atorvastatin  40 mg Oral q1800  . furosemide  40 mg Intravenous Once  . furosemide  80 mg Intravenous BID   Infusions:  . norepinephrine (LEVOPHED) Adult infusion 20 mcg/min (06/17/14 0751)    Assessment: 69 yo male with ACS and mechanical aortic valve thrombosis continuing on IV heparin.  Patient continues to have bleeding issues, increased from previous days. Hematuria and blood in stools, now also with clots per RN. Hg drop from >11 to 7.4 overnight.  Integrelin stopped overnight. Now cards to stop heparin @0630  for now and transfuse.  Heparin still on hold. Patient is receiving 1st unit of PRBC per RN. No plans for heparin restart yet. Hg down to 7.4, plt stable 119.  Goal of Therapy:  Heparin level 0.3-0.7 Monitor platelets by anticoagulation protocol: Yes   Plan:  -Heparin on hold with bleed/transfusion per cards  -F/u heparin IV restart as appropriate  -Daily HL/CBC  -Mon s/sx increased bleed  -Integrelin d/c'd   Elicia Lamp, PharmD Clinical Pharmacist - Resident Pager [phone] 06/17/2014 8:52 AM

## 2014-06-17 NOTE — Progress Notes (Signed)
ANTICOAGULATION CONSULT NOTE - Follow Up Consult  Pharmacy Consult for heparin and integrilin Indication: chest pain/ACS, awaiting potential OHS; mechanical aortic valve thrombosis  No Known Allergies  Patient Measurements: Height: 5\' 9"  (175.3 cm) Weight: 218 lb 4.1 oz (99 kg) IBW/kg (Calculated) : 70.7 Heparin Dosing Weight:92 kg  Vital Signs: Temp: 99.3 F (37.4 C) (05/22 1200) Temp Source: Oral (05/22 1200) BP: 137/76 mmHg (05/22 1300) Pulse Rate: 95 (05/22 1300)  Labs:  Recent Labs  06/14/14 2105  06/15/14 0112 06/15/14 0729 06/15/14 1336  06/15/14 2322 06/16/14 0222  06/16/14 2209 06/17/14 0140 06/17/14 0220 06/17/14 0602 06/17/14 1116  HGB 12.3*  --   --  11.7*  --   --  11.3* 11.4*  < > 9.1* 8.4*  --  7.4*  --   HCT 34.8*  --   --  34.3*  --   --  34.1* 34.4*  < > 27.1* 24.3*  --  21.6*  --   PLT 122*  --   --  112*  --   --  117* 117*  < > 121* 120*  --  119*  --   LABPROT  --   --   --   --  19.7*  --   --   --   --   --   --   --   --   --   INR  --   --   --   --  1.67*  --   --   --   --   --   --   --   --   --   HEPARINUNFRC  --   < > 0.52 0.79*  --   < > 0.47 0.42  --   --   --  0.35  --   --   CREATININE 1.60*  --   --  1.48* 1.39*  --   --   --   --   --   --   --   --  1.32*  TROPONINI 1.28*  --  1.33* 1.09*  --   --   --   --   --   --   --   --   --   --   < > = values in this interval not displayed.  Estimated Creatinine Clearance: 63 mL/min (by C-G formula based on Cr of 1.32).   Medications:  Scheduled:  . aspirin  81 mg Oral Daily  . atorvastatin  40 mg Oral q1800  . furosemide  40 mg Intravenous Once  . pantoprazole (PROTONIX) IV  40 mg Intravenous BID   Infusions:  . norepinephrine (LEVOPHED) Adult infusion 18 mcg/min (06/17/14 1353)    Assessment: 69 yo male with ACS and mechanical aortic valve thrombosis Pharmacy consulted to dose IV heparin. Will likely need high-risk valve debridement.  Patient continues to have bleeding  issues, increased from previous days. Hematuria and blood in stools, now also with clots per RN. Hg drop from >11 to 7.4 overnight. Integrelin stopped and heparin held while patient transfused 2u PRBC this AM.  To hold all anticoag for today due to bleed per Bensimhon. Probable cath scheduled for 5/24. GI consulted and plan EGD 5/23 AM - if decompensates, will do emergently today.  Hg down to 7.4, plt stable 119.  Goal of Therapy:  Heparin level 0.3-0.7 Monitor platelets by anticoagulation protocol: Yes   Plan:  -Heparin on hold for today  -F/u heparin IV restart  as appropriate  -Daily HL/CBC  -Mon s/sx increased bleed  -EGD scheduled for 5/23 AM - if decompensates, will do emergently  -Probable RHC on 5/24  Elicia Lamp, PharmD Clinical Pharmacist - Resident Pager [phone] 06/17/2014 1:56 PM

## 2014-06-18 ENCOUNTER — Encounter (HOSPITAL_COMMUNITY): Payer: Medicare PPO

## 2014-06-18 ENCOUNTER — Encounter (HOSPITAL_COMMUNITY): Payer: Self-pay | Admitting: Certified Registered Nurse Anesthetist

## 2014-06-18 ENCOUNTER — Inpatient Hospital Stay (HOSPITAL_COMMUNITY): Payer: Commercial Managed Care - HMO

## 2014-06-18 ENCOUNTER — Encounter (HOSPITAL_COMMUNITY): Payer: Self-pay | Admitting: Cardiology

## 2014-06-18 ENCOUNTER — Encounter (HOSPITAL_COMMUNITY)
Admission: RE | Disposition: A | Discharge status: Rehabilitation Facility | Payer: Self-pay | Source: Other Acute Inpatient Hospital | Attending: Cardiology

## 2014-06-18 DIAGNOSIS — K921 Melena: Secondary | ICD-10-CM | POA: Insufficient documentation

## 2014-06-18 DIAGNOSIS — K259 Gastric ulcer, unspecified as acute or chronic, without hemorrhage or perforation: Secondary | ICD-10-CM | POA: Insufficient documentation

## 2014-06-18 HISTORY — PX: ESOPHAGOGASTRODUODENOSCOPY: SHX5428

## 2014-06-18 LAB — CBC
HCT: 24.8 % — ABNORMAL LOW (ref 39.0–52.0)
HEMATOCRIT: 24.4 % — AB (ref 39.0–52.0)
Hemoglobin: 8.5 g/dL — ABNORMAL LOW (ref 13.0–17.0)
Hemoglobin: 8.1 g/dL — ABNORMAL LOW (ref 13.0–17.0)
MCH: 30.4 pg (ref 26.0–34.0)
MCH: 29.7 pg (ref 26.0–34.0)
MCHC: 34.3 g/dL (ref 30.0–36.0)
MCHC: 33.2 g/dL (ref 30.0–36.0)
MCV: 88.6 fL (ref 78.0–100.0)
MCV: 89.4 fL (ref 78.0–100.0)
Platelets: 128 10*3/uL — ABNORMAL LOW (ref 150–400)
Platelets: 132 10*3/uL — ABNORMAL LOW (ref 150–400)
RBC: 2.8 MIL/uL — ABNORMAL LOW (ref 4.22–5.81)
RBC: 2.73 MIL/uL — AB (ref 4.22–5.81)
RDW: 17.9 % — AB (ref 11.5–15.5)
RDW: 18 % — AB (ref 11.5–15.5)
WBC: 9.6 10*3/uL (ref 4.0–10.5)
WBC: 9.2 10*3/uL (ref 4.0–10.5)

## 2014-06-18 LAB — TYPE AND SCREEN
ABO/RH(D): A POS
Antibody Screen: NEGATIVE
Unit division: 0
Unit division: 0

## 2014-06-18 LAB — BASIC METABOLIC PANEL
Anion gap: 8 (ref 5–15)
BUN: 44 mg/dL — AB (ref 6–20)
CO2: 25 mmol/L (ref 22–32)
CREATININE: 1.24 mg/dL (ref 0.61–1.24)
Calcium: 8.3 mg/dL — ABNORMAL LOW (ref 8.9–10.3)
Chloride: 99 mmol/L — ABNORMAL LOW (ref 101–111)
GFR calc Af Amer: >60 mL/min (ref >60)
GFR, EST NON AFRICAN AMERICAN: 58 mL/min — AB (ref >60)
Glucose, Bld: 125 mg/dL — ABNORMAL HIGH (ref 65–99)
POTASSIUM: 4.3 mmol/L (ref 3.5–5.1)
Sodium: 132 mmol/L — ABNORMAL LOW (ref 135–145)

## 2014-06-18 LAB — CARBOXYHEMOGLOBIN
Carboxyhemoglobin: 2.4 % — ABNORMAL HIGH (ref 0.5–1.5)
Methemoglobin: 1 % (ref 0.0–1.5)
O2 SAT: 62.2 %
Total hemoglobin: 8.6 g/dL — ABNORMAL LOW (ref 13.5–18.0)

## 2014-06-18 LAB — AMMONIA: Ammonia: 13 umol/L (ref 9–35)

## 2014-06-18 SURGERY — EGD (ESOPHAGOGASTRODUODENOSCOPY)
Anesthesia: Moderate Sedation

## 2014-06-18 MED ORDER — SODIUM CHLORIDE 0.9 % IV SOLN: 250.0000 mL | INTRAVENOUS | Status: DC | PRN

## 2014-06-18 MED ORDER — DIPHENHYDRAMINE HCL 50 MG/ML IJ SOLN
INTRAMUSCULAR | Status: AC
  Filled 2014-06-18: qty 1

## 2014-06-18 MED ORDER — BUTAMBEN-TETRACAINE-BENZOCAINE 2-2-14 % EX AERO
INHALATION_SPRAY | CUTANEOUS | Status: DC | PRN
  Administered 2014-06-18: 2 via TOPICAL

## 2014-06-18 MED ORDER — SODIUM CHLORIDE 0.9 % IV SOLN
INTRAVENOUS | Status: DC
  Administered 2014-06-19: 10 mL/h via INTRAVENOUS

## 2014-06-18 MED ORDER — BOOST / RESOURCE BREEZE PO LIQD
1.0000 | Freq: Two times a day (BID) | ORAL | Status: DC
  Administered 2014-06-18 – 2014-06-20 (×5): 1 via ORAL

## 2014-06-18 MED ORDER — SODIUM CHLORIDE 0.9 % IJ SOLN: 3.0000 mL | INTRAMUSCULAR | Status: DC | PRN

## 2014-06-18 MED ORDER — FENTANYL CITRATE (PF) 100 MCG/2ML IJ SOLN
INTRAMUSCULAR | Status: AC
  Filled 2014-06-18: qty 2

## 2014-06-18 MED ORDER — MIDAZOLAM HCL 10 MG/2ML IJ SOLN
INTRAMUSCULAR | Status: DC | PRN
  Administered 2014-06-18 (×3): 1 mg via INTRAVENOUS

## 2014-06-18 MED ORDER — DIPHENHYDRAMINE HCL 50 MG/ML IJ SOLN
INTRAMUSCULAR | Status: DC | PRN
  Administered 2014-06-18: 25 mg via INTRAVENOUS

## 2014-06-18 MED ORDER — FENTANYL CITRATE (PF) 100 MCG/2ML IJ SOLN
INTRAMUSCULAR | Status: DC | PRN
  Administered 2014-06-18 (×3): 12.5 ug via INTRAVENOUS

## 2014-06-18 MED ORDER — SODIUM CHLORIDE 0.9 % IV SOLN
INTRAVENOUS | Status: DC
  Administered 2014-06-18: 20 mL/h via INTRAVENOUS

## 2014-06-18 MED ORDER — MIDAZOLAM HCL 2 MG/2ML IJ SOLN
INTRAMUSCULAR | Status: AC
  Filled 2014-06-18: qty 4

## 2014-06-18 MED ORDER — SODIUM CHLORIDE 0.9 % IJ SOLN
3.0000 mL | Freq: Two times a day (BID) | INTRAMUSCULAR | Status: DC
  Administered 2014-06-18: 3 mL via INTRAVENOUS

## 2014-06-18 MED ORDER — ASPIRIN 81 MG PO CHEW
81.0000 mg | CHEWABLE_TABLET | ORAL | Status: AC
  Administered 2014-06-19: 81 mg via ORAL

## 2014-06-18 NOTE — Op Note (Signed)
[facility] [place], [id number]   ENDOSCOPY PROCEDURE REPORT  PATIENT: Mahkai, [name]  MR#: [id number] BIRTHDATE: 1945/11/05 , 69  yrs. old GENDER: male ENDOSCOPIST: Jerene Bears, MD REFERRED BY:  [facility] and CT surgery, Dr. Prescott Gum PROCEDURE DATE:  06/18/2014 PROCEDURE:  EGD, diagnostic ASA CLASS:     Class IV INDICATIONS:  melena and acute post hemorrhagic anemia. MEDICATIONS: Benadryl 25 mg IV, Versed 4 mg IV, and Fentanyl 37.5 mcg IV TOPICAL ANESTHETIC: none  DESCRIPTION OF PROCEDURE: After the risks benefits and alternatives of the procedure were thoroughly explained, informed consent was obtained.  The standard Pentax adult upper  endoscope was introduced through the mouth and advanced to the second portion of the duodenum , Without limitations.  The instrument was slowly withdrawn as the mucosa was fully examined.  ESOPHAGUS: The mucosa of the esophagus appeared normal. Z-line slightly irregular without nodularity  STOMACH: Two non-bleeding erosions were found in the cardia just distal to the GEJ on the gastric side.  The largest with linear erosion possibly Cameron's lesion.  This is the most likely source for melena.  No signs of active bleeding.  Consideration given for endo-clip placement, but decision made for observation given lack of active bleeding and after discussion with Dr. Prescott Gum during the case.  The stomach otherwise appeared normal.  DUODENUM: Mild duodenal inflammation was found in the duodenal bulb. No erosions or ulcers.  Normal examined D2.  Retroflexed views revealed as previously described.     The scope was then withdrawn from the patient and the procedure completed.  COMPLICATIONS: There were no immediate complications.  ENDOSCOPIC IMPRESSION: 1.   The mucosa of the esophagus appeared normal with mildly irregular Z-line 2.   Two gastric cardia erosions without active bleeding,  presumed cause of recent melena 3.   The stomach otherwise appeared normal 4.   Very mild bulbar duodenitis with normal examined 2nd part of the duodenum  RECOMMENDATIONS: 1.  Twice daily PPI 2.  Monitor for rebleeding if/when anticoagulation is resumed  eSigned:  Jerene Bears, MD 06/18/2014 1:14 PM

## 2014-06-18 NOTE — Progress Notes (Signed)
Day of Surgery Procedure(s) (LRB): ESOPHAGOGASTRODUODENOSCOPY (EGD) (N/A) Subjective: Events noted-findings at endoscopy discussed with Dr. Hilarie Fredrickson Patient currently stable on IV heparin Coronary angiography scheduled for tomorrow  Objective: Vital signs in last 24 hours: Temp:  [98 F (36.7 C)-98.7 F (37.1 C)] 98.2 F (36.8 C) (05/23 1145) Pulse Rate:  [25-110] 87 (05/23 1445) Cardiac Rhythm:  [-] Atrial fibrillation (05/23 0800) Resp:  [13-28] 24 (05/23 1445) BP: (87-148)/(47-92) 120/71 mmHg (05/23 1400) SpO2:  [100 %] 100 % (05/23 1445) Arterial Line BP: (72-138)/(49-72) 112/64 mmHg (05/23 1445) Weight:  [214 lb 1.1 oz (97.1 kg)] 214 lb 1.1 oz (97.1 kg) (05/23 0440)  Hemodynamic parameters for last 24 hours: CVP:  [3 mmHg] 3 mmHg  Intake/Output from previous day: 05/22 0701 - 05/23 0700 In: 1132.9 [I.V.:401.9; Blood:731] Out: 4000 [Urine:4000] Intake/Output this shift: Total I/O In: 117.6 [I.V.:117.6] Out: 650 [Urine:650]    Lab Results:  Recent Labs  06/17/14 2150 06/18/14 0800  WBC 9.3 9.6  HGB 8.6* 8.5*  HCT 25.3* 24.8*  PLT 120* 128*   BMET:  Recent Labs  06/17/14 1116 06/18/14 0430  NA 129* 132*  K 4.8 4.3  CL 98* 99*  CO2 24 25  GLUCOSE 139* 125*  BUN 58* 44*  CREATININE 1.32* 1.24  CALCIUM 7.9* 8.3*    PT/INR: No results for input(s): LABPROT, INR in the last 72 hours. ABG    Component Value Date/Time   PHART 7.370 06/15/2014 1421   HCO3 23.2 06/15/2014 1421   TCO2 24 06/15/2014 1421   ACIDBASEDEF 2.0 06/15/2014 1421   O2SAT 62.2 06/18/2014 0440   CBG (last 3)  No results for input(s): GLUCAP in the last 72 hours.  Assessment/Plan: S/P Procedure(s) (LRB): ESOPHAGOGASTRODUODENOSCOPY (EGD) (N/A) Continue IV heparin for mechanical valve Will review results of coronary angiograms tomorrow Plan high-risk redo replacement on Thursday a.m.  LOS: 4 days    Tharon Aquas Trigt III 06/18/2014

## 2014-06-18 NOTE — Plan of Care (Signed)
Problem: Phase I Progression Outcomes Goal: Up in chair, BRP Outcome: Not Met (add Reason) Pt on bedrest

## 2014-06-18 NOTE — Progress Notes (Signed)
Initial Nutrition Assessment   INTERVENTION:  Boost Breeze BID - consider change to Ensure Enlive once diet advanced.   NUTRITION DIAGNOSIS:  Increased nutrient needs related to  (surgery) as evidenced by estimated needs.   GOAL:  Patient will meet greater than or equal to 90% of their needs   MONITOR:  PO intake, Supplement acceptance, Diet advancement, Labs, Weight trends  REASON FOR ASSESSMENT:  Malnutrition Screening Tool    ASSESSMENT:  Pt admitted with cardiogenic shock due to partially thrombosed mechanical AoV after stopping coumadin for unclear reasons several months ago. Pt has hx of duodenal ulcer s/p clipping.  Pt developed GI bleed, EDG today identified tear not actively bleeding with no intervention at this time.  Plan for cath 5/24 and valve repair 5/26.  Pt slow to answer questions and reports he is unsure of weight loss and would not specifically his usual intake or if it had been less.  Nutrition-Focused physical exam completed. Findings are no fat depletion, no muscle depletion, and no edema.  Pt's weight appears stable PTA, weight has decreased 10 lb since admission, pt has been on lasix and is negative 5 L. Labs reviewed:  Sodium low, BUN elevated PO intake so far has been variable, 0-100%.      Height:  Ht Readings from Last 1 Encounters:  06/15/14 5\' 9"  (1.753 m)    Weight:  Wt Readings from Last 1 Encounters:  06/18/14 214 lb 1.1 oz (97.1 kg)    Ideal Body Weight:  72.7 kg  Wt Readings from Last 10 Encounters:  06/18/14 214 lb 1.1 oz (97.1 kg)  12/27/12 223 lb (101.152 kg)  11/30/12 223 lb 12.8 oz (101.515 kg)  01/13/10 236 lb (107.049 kg)    BMI:  Body mass index is 31.6 kg/(m^2).  Estimated Nutritional Needs:  Kcal:  1900-2100  Protein:  95-110 grams  Fluid:  >/= 1.5 L/day  Skin:  Reviewed, no issues  Diet Order:  Diet clear liquid Room service appropriate?: Yes; Fluid consistency:: Thin  EDUCATION NEEDS:  No  education needs identified at this time   Intake/Output Summary (Last 24 hours) at 06/18/14 1320 Last data filed at 06/18/14 1200  Gross per 24 hour  Intake 705.92 ml  Output   3400 ml  Net -2694.08 ml    Last BM:  5/22  [place], [place], [place] Pager [phone] After Hours Pager

## 2014-06-18 NOTE — H&P (View-Only) (Signed)
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  The patient was told to stop coumadin 3 months ago and as a result he developed a thrombus that is inhibiting the functioning of the aortic valve.  He has severe aortic insufficiency.  As a result of the thrombus he was started on anticoagulation and subsequently he started to have episodes of melena with a concurrent drop in his HGB.  The anticoagulation was stopped and his bleeding has stopped.  In August 2014 he presented to [facility] with an acute GI bleed.  At that time he was drinking heavily and using NSAIDs.  The EGD revealed an acute gastritis and a large duodenal bulb ulcer with a visible vessel.  The vessel was injected with Epi and hemoclipped.  Additionally, he was [name] to be H. Pylori positive and the completed treatment.  Last evening, his last melenic bowel movement was at 2 AM and Nursing just cleaned him up from a 500 ml melenic bowel movement.  No change with his blood pressure.  Past Medical History  Diagnosis Date  . Chest pain   . Hypertension   . Aortic aneurysm 1993  . 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 Pneumonia   . Aortic valve replaced      At [facility]  . Hx of aortic aneurysm repair 06/14/2014    Past Surgical History  Procedure Laterality Date  . Abdominal aortic aneurysm repair  1993    At [facility], Unspecified  . Splenectomy    . Cardiac valve replacement      At [facility]  . Esophagogastroduodenoscopy  Aug 2014    [facility]: large duodenal ulcer with visible vessel, s/p clip and epi, erosive gastritis. +h.pylori serology, treated with  amoxicillin and biaxin.   . Colonoscopy N/A 12/27/2012    Procedure: COLONOSCOPY;  Surgeon: Danie Binder, MD;  Location: [facility];  Service: Endoscopy;  Laterality: N/A;  8:45-moved to 64     Family History  Problem Relation Age of Onset  . Hypertension Father 45  . Diabetes Mother 64  . Colon cancer Neg Hx     Social History:  reports that he has quit smoking. He does not have any smokeless tobacco history on file. He reports that he does not drink alcohol or use illicit drugs.  Allergies: No Known Allergies  Medications:  Scheduled: . aspirin  81 mg Oral Daily  . atorvastatin  40 mg Oral q1800  . furosemide  40 mg Intravenous Once  . pantoprazole (PROTONIX) IV  40 mg Intravenous BID   Continuous: . norepinephrine (LEVOPHED) Adult infusion 20 mcg/min (06/17/14 0751)    Results for orders placed or performed during the hospital encounter of 06/14/14 (from the past 24 hour(s))  Carboxyhemoglobin     Status: Abnormal   Collection Time: 06/16/14  4:00 PM  Result Value Ref Range   Total hemoglobin 10.1 (L) 13.5 - 18.0 g/dL   O2 Saturation 49.0 %   Carboxyhemoglobin 1.9 (H) 0.5 - 1.5 %   Methemoglobin 1.0 0.0 - 1.5 %  CBC     Status: Abnormal   Collection  Time: 06/16/14  5:25 PM  Result Value Ref Range   WBC 7.9 4.0 - 10.5 K/uL   RBC 3.12 (L) 4.22 - 5.81 MIL/uL   Hemoglobin 9.8 (L) 13.0 - 17.0 g/dL   HCT 30.1 (L) 39.0 - 52.0 %   MCV 96.5 78.0 - 100.0 fL   MCH 31.4 26.0 - 34.0 pg   MCHC 32.6 30.0 - 36.0 g/dL   RDW 14.4 11.5 - 15.5 %   Platelets 122 (L) 150 - 400 K/uL  CBC     Status: Abnormal   Collection Time: 06/16/14 10:09 PM  Result Value Ref Range   WBC 8.1 4.0 - 10.5 K/uL   RBC 2.89 (L) 4.22 - 5.81 MIL/uL   Hemoglobin 9.1 (L) 13.0 - 17.0 g/dL   HCT 27.1 (L) 39.0 - 52.0 %   MCV 93.8 78.0 - 100.0 fL   MCH 31.5 26.0 - 34.0 pg   MCHC 33.6 30.0 - 36.0 g/dL   RDW 14.0 11.5 - 15.5 %   Platelets 121 (L) 150 - 400 K/uL  CBC     Status: Abnormal   Collection  Time: 06/17/14  1:40 AM  Result Value Ref Range   WBC 8.3 4.0 - 10.5 K/uL   RBC 2.57 (L) 4.22 - 5.81 MIL/uL   Hemoglobin 8.4 (L) 13.0 - 17.0 g/dL   HCT 24.3 (L) 39.0 - 52.0 %   MCV 94.6 78.0 - 100.0 fL   MCH 32.7 26.0 - 34.0 pg   MCHC 34.6 30.0 - 36.0 g/dL   RDW 14.0 11.5 - 15.5 %   Platelets 120 (L) 150 - 400 K/uL  Heparin level (unfractionated)     Status: None   Collection Time: 06/17/14  2:20 AM  Result Value Ref Range   Heparin Unfractionated 0.35 0.30 - 0.70 IU/mL  Carboxyhemoglobin     Status: Abnormal   Collection Time: 06/17/14  4:25 AM  Result Value Ref Range   Total hemoglobin 7.7 (L) 13.5 - 18.0 g/dL   O2 Saturation 98.9 %   Carboxyhemoglobin 2.3 (H) 0.5 - 1.5 %   Methemoglobin 1.3 0.0 - 1.5 %  CBC     Status: Abnormal   Collection Time: 06/17/14  6:02 AM  Result Value Ref Range   WBC 8.5 4.0 - 10.5 K/uL   RBC 2.29 (L) 4.22 - 5.81 MIL/uL   Hemoglobin 7.4 (L) 13.0 - 17.0 g/dL   HCT 21.6 (L) 39.0 - 52.0 %   MCV 94.3 78.0 - 100.0 fL   MCH 32.3 26.0 - 34.0 pg   MCHC 34.3 30.0 - 36.0 g/dL   RDW 13.9 11.5 - 15.5 %   Platelets 119 (L) 150 - 400 K/uL  Prepare RBC     Status: None   Collection Time: 06/17/14  6:44 AM  Result Value Ref Range   Order Confirmation ORDER PROCESSED BY BLOOD BANK      Dg Chest Port 1 View  06/16/2014   CLINICAL DATA:  Central line placement.  EXAM: PORTABLE CHEST - 1 VIEW  COMPARISON:  06/14/2014  FINDINGS: New right jugular central line is been placed with the tip in the upper right atrium. No pneumothorax. Stable volume loss of the right lung with slightly more prominent mid lung atelectasis. Mild interstitial edema suspected. No pleural effusions. Stable cardiac enlargement.  IMPRESSION: Central line tip in upper right atrium. No pneumothorax. Slightly more prominent right mid lung atelectasis. Mild interstitial edema suspected.   Electronically Signed   By: Eulas Post  Kathlene Cote [name]   On: 06/16/2014 13:58    ROS:  As stated above in the HPI  otherwise negative.  Blood pressure 103/59, pulse 92, temperature 99.3 F (37.4 C), temperature source Oral, resp. rate 17, height 5\' 9"  (1.753 m), weight 99 kg (218 lb 4.1 oz), SpO2 100 %.    PE: Gen: NAD, Alert and Oriented, fatigued ABM: Soft, NTND, +BS Ext: No C/C/E  Assessment/Plan: 1) History of a duodenal ulcer s/p clipping. 2) Anemia and melena on anticoagulation. 3) Severe AI.   It has been almost 12 hours between his melenic bowel movements.  His pressors were increased a 1-2 hours ago, but he remains hemodynamically stable.  If he should decompensate today or this evening, I will perform an emergent EGD, however, at this time, he will be posted for a procedure in the AM with Dr. Hilarie Fredrickson.  Plan: 1) EGD in the AM with Dr. Hilarie Fredrickson. 2) Follow HGB and transfuse as necessary. 3) Continue with Protonix.  Linda Biehn D 06/17/2014, 12:29 PM

## 2014-06-18 NOTE — Care Management Note (Signed)
Case Management Note  Patient Details  Name: [name] MRN: [id number] Date of Birth: 09-28-1945  Subjective/Objective:                    Action/Plan:   Expected Discharge Date:                  Expected Discharge Plan:  [place]  In-House Referral:     Discharge planning Services     Post Acute Care Choice:    Choice offered to:     DME Arranged:    DME Agency:     HH Arranged:    [place] Agency:     Status of Service:     Medicare Important Message Given:  Yes Date Medicare IM Given:  06/18/14 Medicare IM give by:  debbie Rafe Mackowski rn,[email or web address] Date Additional Medicare IM Given:    Additional Medicare Important Message give by:     If discussed at [place] of Stay Meetings, dates discussed:  06/19/14 Additional Comments:  Lacretia Leigh, RN 06/18/2014, 9:41 AM

## 2014-06-18 NOTE — Progress Notes (Signed)
   Hemoglobin stable. GI planning EGD in am.  Joylynn Defrancesco,MD 12:08 AM

## 2014-06-18 NOTE — Interval H&P Note (Signed)
History and Physical Interval Note: Pt with severe AI in the setting of failed previously placed aortic valve.  Now with melena.  On pressors from cardiac standpoint. HIGHER THAN BASELINE RISK.The nature of the procedure, as well as the risks, benefits, and alternatives were carefully and thoroughly reviewed with the patient. Ample time for discussion and questions allowed. The patient understood, was satisfied, and agreed to proceed.  Plan for AVR later this week and probable cardiac cath tomorrow  Further recommendations after EGD   06/18/2014 12:07 PM  [name]  has presented today for surgery, with the diagnosis of Upper GI bleed  The various methods of treatment have been discussed with the patient and family. After consideration of risks, benefits and other options for treatment, the patient has consented to  Procedure(s): ESOPHAGOGASTRODUODENOSCOPY (EGD) (N/A) as a surgical intervention .  The patient's history has been reviewed, patient examined, no change in status, stable for surgery.  I have reviewed the patient's chart and labs.  Questions were answered to the patient's satisfaction.     [name], [name]

## 2014-06-18 NOTE — Progress Notes (Signed)
Patient Name: [name] Date of Encounter: 06/18/2014   SUBJECTIVE  69 y/o male admitted with cardiogenic shock due to partially thrombosed mechanical AoV after stopping coumadin for unclear reasons several months ago. Had duodenal ulcer in 2014 clipped at [facility]  Developed GI bleed on integrelin and heparin which has now been discontinued.    CURRENT MEDS . aspirin  81 mg Oral Daily  . atorvastatin  40 mg Oral q1800  . furosemide  40 mg Intravenous Once  . pantoprazole (PROTONIX) IV  40 mg Intravenous BID   . norepinephrine (LEVOPHED) Adult infusion 6 mcg/min (06/18/14 0600)   OBJECTIVE  Filed Vitals:   06/18/14 0400 06/18/14 0440 06/18/14 0500 06/18/14 0600  BP: 148/92  87/70 140/87  Pulse: 97   91  Temp: 98.4 F (36.9 C)     TempSrc: Oral     Resp: 20 13 14 15   Height:      Weight:  214 lb 1.1 oz (97.1 kg)    SpO2: 100%   100%    Intake/Output Summary (Last 24 hours) at 06/18/14 0733 Last data filed at 06/18/14 0600  Gross per 24 hour  Intake 1117.27 ml  Output   4000 ml  Net -2882.73 ml   Filed Weights   06/16/14 0600 06/17/14 0443 06/18/14 0440  Weight: 225 lb 15.5 oz (102.5 kg) 218 lb 4.1 oz (99 kg) 214 lb 1.1 oz (97.1 kg)    PHYSICAL EXAM  General: NAD, lying in bed. Neuro: Alert and oriented X 3. Moves all extremities spontaneously. HEENT:  normal Neck: Supple  Lungs:  CTA Heart: Irregular  3/6 systolic murmur, 2/6 diastolic murmur Abdomen: Soft, non-tender, not tender Extremities: no edema  CBC  Recent Labs  06/17/14 1625 06/17/14 2150  WBC 8.7 9.3  HGB 8.6* 8.6*  HCT 25.5* 25.3*  MCV 88.2 87.5  PLT 114* 960*   Basic Metabolic Panel  Recent Labs  06/17/14 1116 06/18/14 0430  NA 129* 132*  K 4.8 4.3  CL 98* 99*  CO2 24 25  GLUCOSE 139* 125*  BUN 58* 44*  CREATININE 1.32* 1.24  CALCIUM 7.9* 8.3*  Radiology/Studies  Dg Chest Port 1 View  06/16/2014   CLINICAL DATA:  Central line placement.  EXAM: PORTABLE CHEST - 1  VIEW  COMPARISON:  06/14/2014  FINDINGS: New right jugular central line is been placed with the tip in the upper right atrium. No pneumothorax. Stable volume loss of the right lung with slightly more prominent mid lung atelectasis. Mild interstitial edema suspected. No pleural effusions. Stable cardiac enlargement.  IMPRESSION: Central line tip in upper right atrium. No pneumothorax. Slightly more prominent right mid lung atelectasis. Mild interstitial edema suspected.   Electronically Signed   By: Aletta Edouard [name]   On: 06/16/2014 13:58   Dg Chest Port 1 View  06/14/2014   CLINICAL DATA:  CHF exacerbation, history hypertension and prior AVR  EXAM: PORTABLE CHEST - 1 VIEW  COMPARISON:  Portable exam 2010 hours compared to 06/13/2014  FINDINGS: Enlargement of cardiac silhouette post median sternotomy.  Tortuous aorta.  Mediastinal contours and pulmonary vascularity normal.  Minimal bibasilar atelectasis.  No gross infiltrate, pleural effusion or pneumothorax.  IMPRESSION: Enlargement of cardiac silhouette with minimal bibasilar atelectasis.   Electronically Signed   By: Lavonia Dana [name]   On: 06/14/2014 20:50   TELE: a-fib, rates- 80s- 90s.   Echo- TEE- 5/20- EF- 45-40%, Systolic function was normal. The estimated ejection fraction was in the  range of 55% to 60%. Wall motion was normal; there were no regional wall motion abnormalities. - Aortic valve: St. Jude Mechanical AVR is present. Only one of the leaflets can be visualized as moving and that movement is significantly reduced. The other leaflet is immobile. There is severe aortic insufficiency that is best appreciated in the 120 degree view. 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  ASSESSMENT AND PLAN  69 year old male with h/o aortic root and aortic valve replacement at [facility] in 1993 with 25 mm St Jude valve conduit, who presented to [facility] 5/18 with acute CHF presumably sec to valve dysfunction.   1. 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 Cardiogenic Shock with diastolic CHF exacerbation- likely due to valve thrombosis 3. Acute GI bleed with melena with h/o duodenal ulcer 2014 4. Chronic Atrial Fib 5. Thrombocytopenia 6. 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 The coronary reconstruction was with a 8 mm Gore-Tex graft Cabral type repair in 1993  Patient unchanged this morning. Remains on low-dose levophed. CVP 3. Hemoglobin 8.6 last evening. Will repeat this morning. Transfuse as needed. Anticoagulation on hold. Continue Protonix. Gastroenterology has evaluated and there is plan for EGD today. We will plan cardiac catheterization tomorrow. The risks and benefits were discussed and the patient agrees to proceed. Plan for high risk aortic valve replacement on Thursday with Dr. Prescott Gum.  [name] 7:33 AM

## 2014-06-19 ENCOUNTER — Inpatient Hospital Stay (HOSPITAL_COMMUNITY): Payer: Commercial Managed Care - HMO | Admitting: Certified Registered Nurse Anesthetist

## 2014-06-19 ENCOUNTER — Inpatient Hospital Stay (HOSPITAL_COMMUNITY): Payer: Commercial Managed Care - HMO

## 2014-06-19 ENCOUNTER — Encounter (HOSPITAL_COMMUNITY): Payer: Self-pay | Admitting: Internal Medicine

## 2014-06-19 ENCOUNTER — Ambulatory Visit (HOSPITAL_COMMUNITY): Payer: Commercial Managed Care - HMO

## 2014-06-19 ENCOUNTER — Encounter (HOSPITAL_COMMUNITY)
Admission: RE | Disposition: A | Discharge status: Rehabilitation Facility | Payer: Medicare PPO | Source: Other Acute Inpatient Hospital | Attending: Cardiology

## 2014-06-19 DIAGNOSIS — I2510 Atherosclerotic heart disease of native coronary artery without angina pectoris: Secondary | ICD-10-CM

## 2014-06-19 DIAGNOSIS — I350 Nonrheumatic aortic (valve) stenosis: Secondary | ICD-10-CM

## 2014-06-19 HISTORY — PX: PERIPHERAL VASCULAR CATHETERIZATION: SHX172C

## 2014-06-19 LAB — POCT I-STAT 3, VENOUS BLOOD GAS (G3P V)
ACID-BASE DEFICIT: 2 mmol/L (ref 0.0–2.0)
Acid-base deficit: 1 mmol/L (ref 0.0–2.0)
Bicarbonate: 22.3 mEq/L (ref 20.0–24.0)
Bicarbonate: 23.4 mEq/L (ref 20.0–24.0)
O2 Saturation: 51 %
O2 Saturation: 54 %
TCO2: 24 mmol/L (ref 0–100)
TCO2: 23 mmol/L (ref 0–100)
pCO2, Ven: 35.4 mmHg — ABNORMAL LOW (ref 45.0–50.0)
pCO2, Ven: 36.2 mmHg — ABNORMAL LOW (ref 45.0–50.0)
pH, Ven: 7.407 — ABNORMAL HIGH (ref 7.250–7.300)
pH, Ven: 7.418 — ABNORMAL HIGH (ref 7.250–7.300)
pO2, Ven: 27 mmHg — CL (ref 30.0–45.0)
pO2, Ven: 28 mmHg — CL (ref 30.0–45.0)

## 2014-06-19 LAB — SPIROMETRY WITH GRAPH
FEF 25-75 Post: 1.32 L/sec
FEF 25-75 Pre: 1.14 L/sec
FEF2575-%Change-Post: 15 %
FEF2575-%Pred-Post: 54 %
FEF2575-%Pred-Pre: 47 %
FEV1-%Change-Post: 0 %
FEV1-%Pred-Post: 45 %
FEV1-%Pred-Pre: 45 %
FEV1-Post: 1.25 L
FEV1-Pre: 1.23 L
FEV1FVC-%Change-Post: 2 %
FEV1FVC-%Pred-Pre: 103 %
FEV6-%Change-Post: -1 %
FEV6-%Pred-Post: 44 %
FEV6-%Pred-Pre: 45 %
FEV6-Post: 1.52 L
FEV6-Pre: 1.55 L
FEV6FVC-%Pred-Post: 105 %
FEV6FVC-%Pred-Pre: 105 %
FVC-%Change-Post: -1 %
FVC-%Pred-Post: 42 %
FVC-%Pred-Pre: 43 %
FVC-Post: 1.52 L
FVC-Pre: 1.55 L
Post FEV1/FVC ratio: 82 %
Post FEV6/FVC ratio: 100 %
Pre FEV1/FVC ratio: 80 %
Pre FEV6/FVC Ratio: 100 %

## 2014-06-19 LAB — BASIC METABOLIC PANEL
ANION GAP: 9 (ref 5–15)
BUN: 24 mg/dL — ABNORMAL HIGH (ref 6–20)
CALCIUM: 8.6 mg/dL — AB (ref 8.9–10.3)
CHLORIDE: 97 mmol/L — AB (ref 101–111)
CO2: 24 mmol/L (ref 22–32)
CREATININE: 0.99 mg/dL (ref 0.61–1.24)
Glucose, Bld: 116 mg/dL — ABNORMAL HIGH (ref 65–99)
Potassium: 4.1 mmol/L (ref 3.5–5.1)
Sodium: 130 mmol/L — ABNORMAL LOW (ref 135–145)

## 2014-06-19 LAB — CARBOXYHEMOGLOBIN
Carboxyhemoglobin: 2.1 % — ABNORMAL HIGH (ref 0.5–1.5)
Methemoglobin: 0.9 % (ref 0.0–1.5)
O2 Saturation: 97.5 %
Total hemoglobin: 8.3 g/dL — ABNORMAL LOW (ref 13.5–18.0)

## 2014-06-19 LAB — PREPARE RBC (CROSSMATCH)

## 2014-06-19 LAB — POCT I-STAT 3, ART BLOOD GAS (G3+)
Acid-base deficit: 2 mmol/L (ref 0.0–2.0)
Bicarbonate: 21.4 mEq/L (ref 20.0–24.0)
O2 Saturation: 96 %
PH ART: 7.44 (ref 7.350–7.450)
TCO2: 22 mmol/L (ref 0–100)
pCO2 arterial: 31.6 mmHg — ABNORMAL LOW (ref 35.0–45.0)
pO2, Arterial: 77 mmHg — ABNORMAL LOW (ref 80.0–100.0)

## 2014-06-19 LAB — CBC
HCT: 25.4 % — ABNORMAL LOW (ref 39.0–52.0)
HCT: 24.5 % — ABNORMAL LOW (ref 39.0–52.0)
HEMOGLOBIN: 8.3 g/dL — AB (ref 13.0–17.0)
Hemoglobin: 8.1 g/dL — ABNORMAL LOW (ref 13.0–17.0)
MCH: 29.5 pg (ref 26.0–34.0)
MCH: 29.7 pg (ref 26.0–34.0)
MCHC: 32.7 g/dL (ref 30.0–36.0)
MCHC: 33.1 g/dL (ref 30.0–36.0)
MCV: 90.4 fL (ref 78.0–100.0)
MCV: 89.7 fL (ref 78.0–100.0)
Platelets: 134 10*3/uL — ABNORMAL LOW (ref 150–400)
Platelets: 142 10*3/uL — ABNORMAL LOW (ref 150–400)
RBC: 2.81 MIL/uL — ABNORMAL LOW (ref 4.22–5.81)
RBC: 2.73 MIL/uL — AB (ref 4.22–5.81)
RDW: 17.7 % — AB (ref 11.5–15.5)
RDW: 17.4 % — ABNORMAL HIGH (ref 11.5–15.5)
WBC: 9.4 10*3/uL (ref 4.0–10.5)
WBC: 8.4 10*3/uL (ref 4.0–10.5)

## 2014-06-19 LAB — POCT ACTIVATED CLOTTING TIME
Activated Clotting Time: 159 seconds
Activated Clotting Time: 146 s

## 2014-06-19 LAB — CEA: CEA: 2.7 ng/mL (ref 0.0–4.7)

## 2014-06-19 SURGERY — RIGHT HEART CATH AND CORONARY ANGIOGRAPHY

## 2014-06-19 MED ORDER — SODIUM CHLORIDE 0.9 % IJ SOLN
3.0000 mL | Freq: Two times a day (BID) | INTRAMUSCULAR | Status: DC
Start: 2014-06-19 — End: 2014-06-22
  Administered 2014-06-19 – 2014-06-20 (×4): 3 mL via INTRAVENOUS

## 2014-06-19 MED ORDER — HEPARIN (PORCINE) IN NACL 100-0.45 UNIT/ML-% IJ SOLN: 10.0000 [IU]/kg/h | INTRAMUSCULAR | Status: DC

## 2014-06-19 MED ORDER — ENOXAPARIN SODIUM 40 MG/0.4ML ~~LOC~~ SOLN
40.0000 mg | Freq: Once | SUBCUTANEOUS | Status: AC
  Administered 2014-06-19: 40 mg via SUBCUTANEOUS
  Filled 2014-06-19: qty 0.4

## 2014-06-19 MED ORDER — ALBUTEROL SULFATE (2.5 MG/3ML) 0.083% IN NEBU
2.5000 mg | INHALATION_SOLUTION | Freq: Once | RESPIRATORY_TRACT | Status: AC
  Administered 2014-06-19: 2.5 mg via RESPIRATORY_TRACT

## 2014-06-19 MED ORDER — LIDOCAINE HCL (PF) 1 % IJ SOLN
INTRAMUSCULAR | Status: AC
  Filled 2014-06-19: qty 30

## 2014-06-19 MED ORDER — ACETAMINOPHEN 325 MG PO TABS: 650.0000 mg | ORAL_TABLET | ORAL | Status: DC | PRN

## 2014-06-19 MED ORDER — LIDOCAINE HCL (PF) 1 % IJ SOLN
INTRAMUSCULAR | Status: DC | PRN
  Administered 2014-06-19: 5 mL via INTRADERMAL
  Administered 2014-06-19: 15 mL via INTRADERMAL

## 2014-06-19 MED ORDER — IOHEXOL 350 MG/ML SOLN
INTRAVENOUS | Status: DC | PRN
Start: 2014-06-19 — End: 2014-06-19
  Administered 2014-06-19: 110 mL via INTRA_ARTERIAL

## 2014-06-19 MED ORDER — HEPARIN SODIUM (PORCINE) 1000 UNIT/ML IJ SOLN
INTRAMUSCULAR | Status: DC | PRN
  Administered 2014-06-19: 2000 [IU] via INTRAVENOUS
  Administered 2014-06-19: 2500 [IU] via INTRAVENOUS

## 2014-06-19 MED ORDER — ENSURE ENLIVE PO LIQD
237.0000 mL | Freq: Two times a day (BID) | ORAL | Status: DC
  Administered 2014-06-20: 237 mL via ORAL

## 2014-06-19 MED ORDER — NITROGLYCERIN 1 MG/10 ML FOR IR/CATH LAB
INTRA_ARTERIAL | Status: AC
  Filled 2014-06-19: qty 10

## 2014-06-19 MED ORDER — SODIUM CHLORIDE 0.9 % IJ SOLN: 3.0000 mL | INTRAMUSCULAR | Status: DC | PRN

## 2014-06-19 MED ORDER — SODIUM CHLORIDE 0.9 % IV SOLN: 250.0000 mL | INTRAVENOUS | Status: DC | PRN

## 2014-06-19 MED ORDER — HEPARIN (PORCINE) IN NACL 2-0.9 UNIT/ML-% IJ SOLN
INTRAMUSCULAR | Status: AC
  Filled 2014-06-19: qty 1500

## 2014-06-19 MED ORDER — ONDANSETRON HCL 4 MG/2ML IJ SOLN: 4.0000 mg | Freq: Four times a day (QID) | INTRAMUSCULAR | Status: DC | PRN

## 2014-06-19 MED ORDER — SODIUM CHLORIDE 0.9 % WEIGHT BASED INFUSION: 1.0000 mL/kg/h | INTRAVENOUS | Status: AC

## 2014-06-19 MED ORDER — VERAPAMIL HCL 2.5 MG/ML IV SOLN
INTRAVENOUS | Status: AC
  Filled 2014-06-19: qty 2

## 2014-06-19 MED ORDER — VERAPAMIL HCL 2.5 MG/ML IV SOLN
INTRAVENOUS | Status: DC | PRN
  Administered 2014-06-19: 08:00:00 via INTRA_ARTERIAL

## 2014-06-19 MED ORDER — HEPARIN SODIUM (PORCINE) 1000 UNIT/ML IJ SOLN
INTRAMUSCULAR | Status: AC
Start: 2014-06-19 — End: 2014-06-19
  Filled 2014-06-19: qty 1

## 2014-06-19 SURGICAL SUPPLY — 18 items
CATH 4FR SITESEER MPA2 (CATHETERS) ×4 IMPLANT
CATH EXPO 5F MPA-1 (CATHETERS) ×4 IMPLANT
CATH INFINITI 5 FR JL3.5 (CATHETERS) ×4 IMPLANT
CATH INFINITI 5FR MULTPACK ANG (CATHETERS) ×4 IMPLANT
CATH SWAN GANZ 7F STRAIGHT (CATHETERS) ×4 IMPLANT
DEVICE RAD COMP TR BAND LRG (VASCULAR PRODUCTS) ×4 IMPLANT
GLIDESHEATH SLEND SS 6F .021 (SHEATH) ×4 IMPLANT
KIT HEART LEFT (KITS) ×4 IMPLANT
KIT HEART RIGHT NAMIC (KITS) ×4 IMPLANT
PACK CARDIAC CATHETERIZATION (CUSTOM PROCEDURE TRAY) ×4 IMPLANT
SHEATH PINNACLE 5F 10CM (SHEATH) IMPLANT
SHEATH PINNACLE 7F 10CM (SHEATH) ×4 IMPLANT
SYR MEDRAD MARK V 150ML (SYRINGE) ×4 IMPLANT
TRANSDUCER W/STOPCOCK (MISCELLANEOUS) ×8 IMPLANT
TUBING CIL FLEX 10 FLL-RA (TUBING) ×4 IMPLANT
WIRE EMERALD 3MM-J .035X150CM (WIRE) IMPLANT
WIRE HI TORQ VERSACORE-J 145CM (WIRE) ×4 IMPLANT
WIRE SAFE-T 1.5MM-J .035X260CM (WIRE) ×4 IMPLANT

## 2014-06-19 NOTE — Progress Notes (Addendum)
Day of Surgery Procedure(s) (LRB): Right Heart Cath and Coronary Angiography (N/A) Aortic Arch Angiography Subjective:  No more GI bleeding Coronary arteriograms reviewed with Dr Lendell Caprice Plan redo root replacement on thurs am Objective: Vital signs in last 24 hours: Temp:  [97.9 F (36.6 C)-98.2 F (36.8 C)] 98 F (36.7 C) (05/24 0000) Pulse Rate:  [40-111] 109 (05/24 0904) Cardiac Rhythm:  [-] Atrial fibrillation (05/24 0400) Resp:  [13-28] 20 (05/24 0904) BP: (88-132)/(47-90) 104/74 mmHg (05/24 0859) SpO2:  [0 %-100 %] 97 % (05/24 0917) Arterial Line BP: (72-120)/(49-86) 104/60 mmHg (05/24 0700) Weight:  [211 lb 3.2 oz (95.8 kg)] 211 lb 3.2 oz (95.8 kg) (05/24 0436)  Hemodynamic parameters for last 24 hours: CVP:  [5 mmHg-9 mmHg] 9 mmHg  Intake/Output from previous day: 05/23 0701 - 05/24 0700 In: 940.5 [P.O.:600; I.V.:340.5] Out: 1550 [Urine:1550] Intake/Output this shift:    Breathing comfortably in cath lab holding area  Lab Results:  Recent Labs  06/18/14 1729 06/19/14 0420  WBC 9.2 9.4  HGB 8.1* 8.3*  HCT 24.4* 25.4*  PLT 132* 134*   BMET:  Recent Labs  06/18/14 0430 06/19/14 0420  NA 132* 130*  K 4.3 4.1  CL 99* 97*  CO2 25 24  GLUCOSE 125* 116*  BUN 44* 24*  CREATININE 1.24 0.99  CALCIUM 8.3* 8.6*    PT/INR: No results for input(s): LABPROT, INR in the last 72 hours. ABG    Component Value Date/Time   PHART 7.370 06/15/2014 1421   HCO3 23.2 06/15/2014 1421   TCO2 24 06/15/2014 1421   ACIDBASEDEF 2.0 06/15/2014 1421   O2SAT 97.5 06/19/2014 0500   CBG (last 3)  No results for input(s): GLUCAP in the last 72 hours.  Assessment/Plan: S/P Procedure(s) (LRB): Right Heart Cath and Coronary Angiography (N/A) Aortic Arch Angiography Mobilize Cont heparin until OR Transfuse one unit packed cells preop for severe anemia  LOS: 5 days    [name] 06/19/2014

## 2014-06-19 NOTE — Interval H&P Note (Signed)
History and Physical Interval Note:  06/19/2014 7:48 AM  [name]  has presented today for surgery, with the diagnosis of cp  The various methods of treatment have been discussed with the patient and family. After consideration of risks, benefits and other options for treatment, the patient has consented to  Procedure(s): Right/Left Heart Cath and Coronary Angiography (N/A) as a surgical intervention .  The patient's history has been reviewed, patient examined, no change in status, stable for surgery.  I have reviewed the patient's chart and labs.  Questions were answered to the patient's satisfaction.    Diagnostic cath pre aortic valve replacement.  Use radial access for arterial given anemia.  Venous access from the groin.  Zenovia Justman S.

## 2014-06-19 NOTE — Progress Notes (Addendum)
Patient Name: [name] Date of Encounter: 06/19/2014   SUBJECTIVE  69 y/o male admitted with cardiogenic shock due to partially thrombosed mechanical AoV after stopping coumadin for unclear reasons several months ago. Had duodenal ulcer in 2014 clipped at [facility]  Developed GI bleed on integrelin, ASA and heparin which has now been discontinued.  Patient is s/p cath showing no CAD; no chest pain or dyspnea; levophed off; no melena, hematuria or epistaxis.    CURRENT MEDS . aspirin  81 mg Oral Daily  . atorvastatin  40 mg Oral q1800  . feeding supplement (RESOURCE BREEZE)  1 Container Oral BID BM  . furosemide  40 mg Intravenous Once  . pantoprazole (PROTONIX) IV  40 mg Intravenous BID  . sodium chloride  3 mL Intravenous Q12H   . sodium chloride 1 mL/kg/hr (06/19/14 0928)  . norepinephrine (LEVOPHED) Adult infusion Stopped (06/19/14 0544)   OBJECTIVE  Filed Vitals:   06/19/14 0945 06/19/14 0950 06/19/14 0955 06/19/14 1001  BP: 112/85 108/69 108/67 116/78  Pulse:   25   Temp:      TempSrc:      Resp: 18 18 16 16   Height:      Weight:      SpO2:   100%     Intake/Output Summary (Last 24 hours) at 06/19/14 1044 Last data filed at 06/19/14 0700  Gross per 24 hour  Intake 893.69 ml  Output   1250 ml  Net -356.31 ml   Filed Weights   06/17/14 0443 06/18/14 0440 06/19/14 0436  Weight: 218 lb 4.1 oz (99 kg) 214 lb 1.1 oz (97.1 kg) 211 lb 3.2 oz (95.8 kg)    PHYSICAL EXAM  General: NAD, lying in bed. Neuro: Alert and oriented X 3. Moves all extremities spontaneously. HEENT:  normal Neck: Supple  Lungs:  CTA Heart: Irregular  3/6 systolic murmur, 2/6 diastolic murmur Abdomen: Soft, non-tender, not tender Extremities: no edema  CBC  Recent Labs  06/18/14 1729 06/19/14 0420  WBC 9.2 9.4  HGB 8.1* 8.3*  HCT 24.4* 25.4*  MCV 89.4 90.4  PLT 132* 630*   Basic Metabolic Panel  Recent Labs  06/18/14 0430 06/19/14 0420  NA 132* 130*  K 4.3 4.1    CL 99* 97*  CO2 25 24  GLUCOSE 125* 116*  BUN 44* 24*  CREATININE 1.24 0.99  CALCIUM 8.3* 8.6*  Radiology/Studies  Dg Chest Port 1 View  06/16/2014   CLINICAL DATA:  Central line placement.  EXAM: PORTABLE CHEST - 1 VIEW  COMPARISON:  06/14/2014  FINDINGS: New right jugular central line is been placed with the tip in the upper right atrium. No pneumothorax. Stable volume loss of the right lung with slightly more prominent mid lung atelectasis. Mild interstitial edema suspected. No pleural effusions. Stable cardiac enlargement.  IMPRESSION: Central line tip in upper right atrium. No pneumothorax. Slightly more prominent right mid lung atelectasis. Mild interstitial edema suspected.   Electronically Signed   By: Aletta Edouard [name]   On: 06/16/2014 13:58   Dg Chest Port 1 View  06/14/2014   CLINICAL DATA:  CHF exacerbation, history hypertension and prior AVR  EXAM: PORTABLE CHEST - 1 VIEW  COMPARISON:  Portable exam 2010 hours compared to 06/13/2014  FINDINGS: Enlargement of cardiac silhouette post median sternotomy.  Tortuous aorta.  Mediastinal contours and pulmonary vascularity normal.  Minimal bibasilar atelectasis.  No gross infiltrate, pleural effusion or pneumothorax.  IMPRESSION: Enlargement of cardiac silhouette with minimal bibasilar  atelectasis.   Electronically Signed   By: Lavonia Dana [name]   On: 06/14/2014 20:50   TELE: a-fib, rates- 80s- 90s.   Echo- TEE- 5/20- EF- 16-10%, Systolic function was normal. The estimated ejection fraction was in the range of 55% to 60%. Wall motion was normal; there were no regional wall motion abnormalities. - Aortic valve: St. Jude Mechanical AVR is present. Only one of the leaflets can be visualized as moving and that movement is significantly reduced. The other leaflet is immobile. There is severe aortic insufficiency that is best appreciated in the 120 degree view. - Aorta: There appears to be an aortic dissection flap in  the descending thoracic aorta that extends up to the arch.  ASSESSMENT AND PLAN  69 year old male with h/o aortic root and aortic valve replacement at [facility] in 1993 with 25 mm St Jude valve conduit, who presented to [facility] 5/18 with acute CHF presumably sec to valve dysfunction.   1. 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 Cardiogenic Shock with diastolic CHF exacerbation- likely due to valve thrombosis 3. Acute GI bleed with melena with gastric cardia erosions noted on EGD 4. Chronic Atrial Fib 5. Thrombocytopenia 6. 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 The coronary reconstruction was with a 8 mm Gore-Tex graft Cabral type repair in 1993  Patient is now status post cardiac catheterization. No obstructive disease noted. His Levophed has been discontinued. Hemoglobin today unchanged. EGD revealed gastric cardia erosions. Continue Protonix. Patient is scheduled for transfusion today. Follow hemoglobin. Note his heparin and Integrilin were discontinued previously at time of GI bleed. Plan for high risk aortic valve replacement on Thursday with Dr. Prescott Gum.  [name] 10:44 AM    Discussed with Dr Prescott Gum; will treat with lovenox 40 mg SQ daily; watch for recurrent bleeding [name]

## 2014-06-19 NOTE — Progress Notes (Addendum)
ANTICOAGULATION CONSULT NOTE - Initial Consult  Pharmacy Consult for Heparin Indication: Mechanical AVR  No Known Allergies  Patient Measurements: Height: 5\' 9"  (175.3 cm) Weight: 211 lb 3.2 oz (95.8 kg) IBW/kg (Calculated) : 70.7 Heparin Dosing Weight: 91 kg  Vital Signs: Temp: 98 F (36.7 C) (05/24 0000) Temp Source: Oral (05/24 0000) BP: 116/78 mmHg (05/24 1001) Pulse Rate: 25 (05/24 0955)  Labs:  Recent Labs  06/17/14 0220  06/17/14 1116  06/18/14 0430 06/18/14 0800 06/18/14 1729 06/19/14 0420  HGB  --   < >  --   < >  --  8.5* 8.1* 8.3*  HCT  --   < >  --   < >  --  24.8* 24.4* 25.4*  PLT  --   < >  --   < >  --  128* 132* 134*  HEPARINUNFRC 0.35  --   --   --   --   --   --   --   CREATININE  --   --  1.32*  --  1.24  --   --  0.99  < > = values in this interval not displayed.  Estimated Creatinine Clearance: 82.6 mL/min (by C-G formula based on Cr of 0.99).   Medical History: Past Medical History  Diagnosis Date  . Chest pain   . Hypertension   . Aortic aneurysm 1993  . 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 Pneumonia   . Aortic valve replaced      At [facility]  . Hx of aortic aneurysm repair 06/14/2014    Medications:  Infusions:  . sodium chloride Stopped (06/18/14 2111)  . sodium chloride 10 mL/hr (06/19/14 0531)  . sodium chloride 1 mL/kg/hr (06/19/14 0928)  . heparin    . norepinephrine (LEVOPHED) Adult infusion Stopped (06/19/14 0544)    Assessment: 50 yoM admitted 5/19 for possible cardiogenic shock secondary to aortic valve insufficiency.  He was started on Heparin and Integrelin for ACS with mechanical AV thrombosis and Afib.  Integrelin was discontinued on 5/21 due to bleeding and heparin was discontinued on 5/22 due to bleeding and low hemoglobin.  EGD on 5/23 showed no active bleeding but erosion in gastric candida.  He underwent cath this morning and has plans to undergo redo root replacement on Thursday, 5/26 AM.   Spoke with Dr. Darcey [name] who would like to start patient on heparin until procedure on Thursday.  He is aware that patient had a significant bleed while on anticoagulation but is concerned that without anticoagulation the remainder of his mechanical valve will clot.  He wants to start "low dose" heparin with a HL goal of 0.2-0.3 with no bolus.   Today Hgb is stable at 8.3, plts are stable at 134.  Patient has a TR band in place post-cath.  Goal of Therapy:  Heparin level goal 0.2 to 0.3 (Low goal due to high risk of bleeding) Monitor platelets by anticoagulation protocol: Yes   Plan:  -Heparin 900 units/hr (No bolus) -Start heparin 2 hours post band removal -6-hr HL -Daily CBC, HL, monitor for signs of bleeding -Planned root replacement on 5/26  Theron Arista, PharmD Clinical Pharmacist - Resident Pager: [phone] 5/24/201610:40 AM   Addendum: 06/19/14 1427  Per Dr. Stanford Breed, spoke with Prescott Gum and want to place patient on Lovenox 40 mg SQ daily.  Will discontinue heparin order and start lovenox 2 hours post band-pull.  Theron Arista, PharmD Clinical Pharmacist - Resident Pager: [phone]  5/24/20162:30 PM

## 2014-06-19 NOTE — Progress Notes (Signed)
Site area: RFV Site Prior to Removal:  Level 0 Pressure Applied For:10 min Manual:   yes Patient Status During Pull:  stable Post Pull Site:  Level 0 Post Pull Instructions Given: done  Post Pull Pulses Present: NA Dressing Applied:  clear Bedrest begins @ 1000 Comments:soft raised area above site. Pt states he has a hernia Present pre and post

## 2014-06-19 NOTE — H&P (View-Only) (Signed)
Patient Name: DESIREE FLEMING Date of Encounter: 06/18/2014   SUBJECTIVE  69 y/o male admitted with cardiogenic shock due to partially thrombosed mechanical AoV after stopping coumadin for unclear reasons several months ago. Had duodenal ulcer in 2014 clipped at [facility]  Developed GI bleed on integrelin and heparin which has now been discontinued.    CURRENT MEDS . aspirin  81 mg Oral Daily  . atorvastatin  40 mg Oral q1800  . furosemide  40 mg Intravenous Once  . pantoprazole (PROTONIX) IV  40 mg Intravenous BID   . norepinephrine (LEVOPHED) Adult infusion 6 mcg/min (06/18/14 0600)   OBJECTIVE  Filed Vitals:   06/18/14 0400 06/18/14 0440 06/18/14 0500 06/18/14 0600  BP: 148/92  87/70 140/87  Pulse: 97   91  Temp: 98.4 F (36.9 C)     TempSrc: Oral     Resp: 20 13 14 15   Height:      Weight:  214 lb 1.1 oz (97.1 kg)    SpO2: 100%   100%    Intake/Output Summary (Last 24 hours) at 06/18/14 0733 Last data filed at 06/18/14 0600  Gross per 24 hour  Intake 1117.27 ml  Output   4000 ml  Net -2882.73 ml   Filed Weights   06/16/14 0600 06/17/14 0443 06/18/14 0440  Weight: 225 lb 15.5 oz (102.5 kg) 218 lb 4.1 oz (99 kg) 214 lb 1.1 oz (97.1 kg)    PHYSICAL EXAM  General: NAD, lying in bed. Neuro: Alert and oriented X 3. Moves all extremities spontaneously. HEENT:  normal Neck: Supple  Lungs:  CTA Heart: Irregular  3/6 systolic murmur, 2/6 diastolic murmur Abdomen: Soft, non-tender, not tender Extremities: no edema  CBC  Recent Labs  06/17/14 1625 06/17/14 2150  WBC 8.7 9.3  HGB 8.6* 8.6*  HCT 25.5* 25.3*  MCV 88.2 87.5  PLT 114* 341*   Basic Metabolic Panel  Recent Labs  06/17/14 1116 06/18/14 0430  NA 129* 132*  K 4.8 4.3  CL 98* 99*  CO2 24 25  GLUCOSE 139* 125*  BUN 58* 44*  CREATININE 1.32* 1.24  CALCIUM 7.9* 8.3*  Radiology/Studies  Dg Chest Port 1 View  06/16/2014   CLINICAL DATA:  Central line placement.  EXAM: PORTABLE CHEST - 1  VIEW  COMPARISON:  06/14/2014  FINDINGS: New right jugular central line is been placed with the tip in the upper right atrium. No pneumothorax. Stable volume loss of the right lung with slightly more prominent mid lung atelectasis. Mild interstitial edema suspected. No pleural effusions. Stable cardiac enlargement.  IMPRESSION: Central line tip in upper right atrium. No pneumothorax. Slightly more prominent right mid lung atelectasis. Mild interstitial edema suspected.   Electronically Signed   By: Aletta Edouard [name]   On: 06/16/2014 13:58   Dg Chest Port 1 View  06/14/2014   CLINICAL DATA:  CHF exacerbation, history hypertension and prior AVR  EXAM: PORTABLE CHEST - 1 VIEW  COMPARISON:  Portable exam 2010 hours compared to 06/13/2014  FINDINGS: Enlargement of cardiac silhouette post median sternotomy.  Tortuous aorta.  Mediastinal contours and pulmonary vascularity normal.  Minimal bibasilar atelectasis.  No gross infiltrate, pleural effusion or pneumothorax.  IMPRESSION: Enlargement of cardiac silhouette with minimal bibasilar atelectasis.   Electronically Signed   By: Lavonia Dana [name]   On: 06/14/2014 20:50   TELE: a-fib, rates- 80s- 90s.   Echo- TEE- 5/20- EF- 93-79%, Systolic function was normal. The estimated ejection fraction was in the  range of 55% to 60%. Wall motion was normal; there were no regional wall motion abnormalities. - Aortic valve: St. Jude Mechanical AVR is present. Only one of the leaflets can be visualized as moving and that movement is significantly reduced. The other leaflet is immobile. There is severe aortic insufficiency that is best appreciated in the 120 degree view. 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  ASSESSMENT AND PLAN  69 year old male with h/o aortic root and aortic valve replacement at [facility] in 1993 with 25 mm St Jude valve conduit, who presented to [facility] 5/18 with acute CHF presumably sec to valve dysfunction.   1. 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 Cardiogenic Shock with diastolic CHF exacerbation- likely due to valve thrombosis 3. Acute GI bleed with melena with h/o duodenal ulcer 2014 4. Chronic Atrial Fib 5. Thrombocytopenia 6. 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 The coronary reconstruction was with a 8 mm Gore-Tex graft Cabral type repair in 1993  Patient unchanged this morning. Remains on low-dose levophed. CVP 3. Hemoglobin 8.6 last evening. Will repeat this morning. Transfuse as needed. Anticoagulation on hold. Continue Protonix. Gastroenterology has evaluated and there is plan for EGD today. We will plan cardiac catheterization tomorrow. The risks and benefits were discussed and the patient agrees to proceed. Plan for high risk aortic valve replacement on Thursday with Dr. Prescott Gum.  [name] 7:33 AM

## 2014-06-19 NOTE — Progress Notes (Signed)
Pre-op Cardiac Surgery  Carotid Findings:  1-39% Left ICA stenosis.  Vertebral artery flow is antegrade.  Unable to visualize right ICA secondary to line in neck.  Sharion Dove, RVS  Upper Extremity Right Left  Brachial Pressures 102-Triphasic 94-Triphasic  Radial Waveforms Triphasic Triphasic  Ulnar Waveforms Triphasic Biphasic  Palmar Arch (Allen's Test) Unable to assess due to recent catheterization. Unable to assess due to IV location.   06/19/2014 2:08 PM Maudry Mayhew, RVT, RDCS, RDMS

## 2014-06-20 ENCOUNTER — Encounter (HOSPITAL_COMMUNITY): Payer: Self-pay | Admitting: Certified Registered Nurse Anesthetist

## 2014-06-20 LAB — CBC
HCT: 24.3 % — ABNORMAL LOW (ref 39.0–52.0)
HEMOGLOBIN: 8.1 g/dL — AB (ref 13.0–17.0)
MCH: 30.1 pg (ref 26.0–34.0)
MCHC: 33.3 g/dL (ref 30.0–36.0)
MCV: 90.3 fL (ref 78.0–100.0)
Platelets: 159 10*3/uL (ref 150–400)
RBC: 2.69 MIL/uL — AB (ref 4.22–5.81)
RDW: 17.4 % — AB (ref 11.5–15.5)
WBC: 7.8 10*3/uL (ref 4.0–10.5)

## 2014-06-20 LAB — BASIC METABOLIC PANEL
Anion gap: 9 (ref 5–15)
BUN: 13 mg/dL (ref 6–20)
CALCIUM: 8.7 mg/dL — AB (ref 8.9–10.3)
CO2: 24 mmol/L (ref 22–32)
Chloride: 97 mmol/L — ABNORMAL LOW (ref 101–111)
Creatinine, Ser: 0.94 mg/dL (ref 0.61–1.24)
GFR calc Af Amer: >60 mL/min (ref >60)
GLUCOSE: 103 mg/dL — AB (ref 65–99)
POTASSIUM: 3.9 mmol/L (ref 3.5–5.1)
Sodium: 130 mmol/L — ABNORMAL LOW (ref 135–145)

## 2014-06-20 LAB — GLUCOSE, CAPILLARY
Glucose-Capillary: 117 mg/dL — ABNORMAL HIGH (ref 65–99)
Glucose-Capillary: 114 mg/dL — ABNORMAL HIGH (ref 65–99)

## 2014-06-20 LAB — CARBOXYHEMOGLOBIN
CARBOXYHEMOGLOBIN: 1.8 % — AB (ref 0.5–1.5)
Methemoglobin: 1.1 % (ref 0.0–1.5)
O2 SAT: 59.3 %
TOTAL HEMOGLOBIN: 8.4 g/dL — AB (ref 13.5–18.0)

## 2014-06-20 LAB — PREPARE RBC (CROSSMATCH)

## 2014-06-20 MED ORDER — MAGNESIUM SULFATE 50 % IJ SOLN
40.0000 meq | INTRAMUSCULAR | Status: AC
  Filled 2014-06-20: qty 10

## 2014-06-20 MED ORDER — DEXTROSE 5 % IV SOLN
1.5000 g | INTRAVENOUS | Status: AC
  Filled 2014-06-20: qty 1.5

## 2014-06-20 MED ORDER — LACTULOSE 10 GM/15ML PO SOLN
20.0000 g | Freq: Three times a day (TID) | ORAL | Status: AC
  Administered 2014-06-20: 20 g via ORAL
  Filled 2014-06-20: qty 30

## 2014-06-20 MED ORDER — SODIUM CHLORIDE 0.9 % IV SOLN
INTRAVENOUS | Status: AC
  Filled 2014-06-20: qty 30

## 2014-06-20 MED ORDER — CHLORHEXIDINE GLUCONATE 4 % EX LIQD
60.0000 mL | Freq: Once | CUTANEOUS | Status: AC
  Administered 2014-06-20: 4 via TOPICAL
  Filled 2014-06-20: qty 60

## 2014-06-20 MED ORDER — DOPAMINE-DEXTROSE 3.2-5 MG/ML-% IV SOLN
0.0000 ug/kg/min | INTRAVENOUS | Status: AC
  Filled 2014-06-20: qty 250

## 2014-06-20 MED ORDER — CHLORHEXIDINE GLUCONATE 4 % EX LIQD
60.0000 mL | Freq: Once | CUTANEOUS | Status: AC
  Administered 2014-06-21: 4 via TOPICAL
  Filled 2014-06-20: qty 60

## 2014-06-20 MED ORDER — POTASSIUM CHLORIDE 2 MEQ/ML IV SOLN
80.0000 meq | INTRAVENOUS | Status: AC
  Filled 2014-06-20: qty 40

## 2014-06-20 MED ORDER — PLASMA-LYTE 148 IV SOLN
INTRAVENOUS | Status: AC
  Filled 2014-06-20: qty 2.5

## 2014-06-20 MED ORDER — BISACODYL 5 MG PO TBEC: 5.0000 mg | DELAYED_RELEASE_TABLET | Freq: Once | ORAL | Status: DC

## 2014-06-20 MED ORDER — ENOXAPARIN SODIUM 40 MG/0.4ML ~~LOC~~ SOLN
40.0000 mg | Freq: Once | SUBCUTANEOUS | Status: AC
  Administered 2014-06-20: 40 mg via SUBCUTANEOUS
  Filled 2014-06-20: qty 0.4

## 2014-06-20 MED ORDER — SODIUM CHLORIDE 0.9 % IV SOLN
INTRAVENOUS | Status: AC
  Filled 2014-06-20: qty 2.5

## 2014-06-20 MED ORDER — NITROGLYCERIN IN D5W 200-5 MCG/ML-% IV SOLN
2.0000 ug/min | INTRAVENOUS | Status: AC
  Filled 2014-06-20: qty 250

## 2014-06-20 MED ORDER — VANCOMYCIN HCL 10 G IV SOLR
1500.0000 mg | INTRAVENOUS | Status: AC
  Filled 2014-06-20: qty 1500

## 2014-06-20 MED ORDER — DEXTROSE 5 % IV SOLN
750.0000 mg | INTRAVENOUS | Status: AC
  Filled 2014-06-20: qty 750

## 2014-06-20 MED ORDER — DEXMEDETOMIDINE HCL IN NACL 400 MCG/100ML IV SOLN
0.1000 ug/kg/h | INTRAVENOUS | Status: AC
  Administered 2014-06-21: .3 ug/kg/h via INTRAVENOUS
  Filled 2014-06-20: qty 100

## 2014-06-20 MED ORDER — TEMAZEPAM 15 MG PO CAPS: 15.0000 mg | ORAL_CAPSULE | Freq: Once | ORAL | Status: AC | PRN

## 2014-06-20 MED ORDER — PHENYLEPHRINE HCL 10 MG/ML IJ SOLN
30.0000 ug/min | INTRAVENOUS | Status: AC
  Filled 2014-06-20: qty 2

## 2014-06-20 MED ORDER — DEXTROSE 5 % IV SOLN
0.0000 ug/min | INTRAVENOUS | Status: AC
  Filled 2014-06-20: qty 4

## 2014-06-20 MED ORDER — AMINOCAPROIC ACID 250 MG/ML IV SOLN
INTRAVENOUS | Status: AC
  Filled 2014-06-20: qty 40

## 2014-06-20 MED ORDER — METOPROLOL TARTRATE 12.5 MG HALF TABLET
12.5000 mg | ORAL_TABLET | Freq: Once | ORAL | Status: DC
  Filled 2014-06-20: qty 1

## 2014-06-20 MED FILL — Heparin Sodium (Porcine) 2 Unit/ML in Sodium Chloride 0.9%: INTRAMUSCULAR | Qty: 1500 | Status: AC

## 2014-06-20 NOTE — Progress Notes (Signed)
Transfused 1 unit of blood per order today. Blood transfused following protocol. VS stable throughout the transfusion. Patient tolerated blood transfusion well. No reaction suspected.   Roxan Hockey, RN

## 2014-06-20 NOTE — Care Management Note (Signed)
Case Management Note  Patient Details  Name: [name] MRN: [id number] Date of Birth: 1945-12-27  Subjective/Objective:                    Action/Plan:   Expected Discharge Date:                  Expected Discharge Plan:  [place]  In-House Referral:     Discharge planning Services     Post Acute Care Choice:    Choice offered to:     DME Arranged:    DME Agency:     HH Arranged:    [place] Agency:     Status of Service:     Medicare Important Message Given:  Yes Date Medicare IM Given:  06/18/14 Medicare IM give by:  debbie Missy Baksh rn,[email or web address] Date Additional Medicare IM Given:    Additional Medicare Important Message give by:     If discussed at [place] of Stay Meetings, dates discussed:  06/21/14  Additional Comments:  Lacretia Leigh, RN 06/20/2014, 9:58 AM

## 2014-06-20 NOTE — Progress Notes (Signed)
Surgical consent and blood consent signed by patient and placed in chart.   Roxan Hockey, RN

## 2014-06-20 NOTE — Progress Notes (Signed)
1 Day Post-Op Procedure(s) (LRB): Right Heart Cath and Coronary Angiography (N/A) Aortic Arch Angiography Subjective: The patient continues to be relatively stable despite severe aortic insufficiency and moderate aortic stenosis from a dysfunctional mechanical aortic valve and associated anemia from proximal gastric erosions.  I discussed redo aortic replacement with the patient's daughter [name] by phone. She understands this is a very high-risk procedure with risk of mortality-major morbidity approaching 20-25%. However without surgical repair-replacement of the current aortic valve he would progress with heart failure and his survival would be limited. I've discussed the severe D. Of the operation and the high-risk nature of the operation with the patient as well and he understands and agrees to proceed with this operation to give him the best chance for better long-term survival and better quality of life.  Objective: Vital signs in last 24 hours: Temp:  [96.1 F (35.6 C)-98.2 F (36.8 C)] 98 F (36.7 C) (05/25 1600) Pulse Rate:  [25-94] 93 (05/25 1600) Cardiac Rhythm:  [-] Atrial fibrillation (05/25 0800) Resp:  [11-23] 18 (05/25 1600) BP: (84-127)/(61-92) 94/62 mmHg (05/25 1600) SpO2:  [92 %-100 %] 100 % (05/25 1600) Weight:  [209 lb 14.1 oz (95.2 kg)] 209 lb 14.1 oz (95.2 kg) (05/25 0500)  Hemodynamic parameters for last 24 hours: CVP:  [7 mmHg-8 mmHg] 7 mmHg  Intake/Output from previous day: 05/24 0701 - 05/25 0700 In: 230 [I.V.:230] Out: 2300 [Urine:2300] Intake/Output this shift: Total I/O In: 675 [P.O.:400; Blood:275] Out: -   Lungs clear Currently atrial fibrillation Conjunctiva pale  Lab Results:  Recent Labs  06/19/14 1100 06/20/14 0455  WBC 8.4 7.8  HGB 8.1* 8.1*  HCT 24.5* 24.3*  PLT 142* 159   BMET:  Recent Labs  06/19/14 0420 06/20/14 0455  NA 130* 130*  K 4.1 3.9  CL 97* 97*  CO2 24 24  GLUCOSE 116* 103*  BUN 24* 13  CREATININE 0.99 0.94   CALCIUM 8.6* 8.7*    PT/INR: No results for input(s): LABPROT, INR in the last 72 hours. ABG    Component Value Date/Time   PHART 7.440 06/19/2014 0808   HCO3 22.3 06/19/2014 0823   TCO2 23 06/19/2014 0823   ACIDBASEDEF 2.0 06/19/2014 0823   O2SAT 59.3 06/20/2014 0500   CBG (last 3)   Recent Labs  06/20/14 1203  GLUCAP 117*    Assessment/Plan: S/P Procedure(s) (LRB): Right Heart Cath and Coronary Angiography (N/A) Aortic Arch Angiography Redo aortic valve replacement-repair in a.m.   LOS: 6 days    Tharon Aquas Trigt III 06/20/2014

## 2014-06-20 NOTE — Progress Notes (Signed)
Patient had 1 loose black tarry, bloody stool today. Approx 150 mL.   Roxan Hockey, RN

## 2014-06-20 NOTE — Progress Notes (Signed)
Patient Name: [name] Date of Encounter: 06/20/2014   SUBJECTIVE  69 y/o male admitted with cardiogenic shock due to partially thrombosed mechanical AoV after stopping coumadin for unclear reasons several months ago. Had duodenal ulcer in 2014 clipped at [facility] Developed GI bleed on integrelin, ASA and heparin which has now been discontinued. Patient is s/p cath showing no CAD; no chest pain or dyspnea; levophed off; no melena, hematuria or epistaxis.Today he has no complaints, no chest pain, no SOB, or swelling, no vomiting or abdominal pain, he cannot remember his last bowel movement.  CURRENT MEDS . aspirin  81 mg Oral Daily  . atorvastatin  40 mg Oral q1800  . feeding supplement (ENSURE ENLIVE)  237 mL Oral BID BM  . feeding supplement (RESOURCE BREEZE)  1 Container Oral BID BM  . furosemide  40 mg Intravenous Once  . pantoprazole (PROTONIX) IV  40 mg Intravenous BID  . sodium chloride  3 mL Intravenous Q12H   . norepinephrine (LEVOPHED) Adult infusion Stopped (06/19/14 0544)   OBJECTIVE  Filed Vitals:   06/20/14 0300 06/20/14 0400 06/20/14 0500 06/20/14 0600  BP: 93/72 105/82 113/80 107/64  Pulse: 77 77 92 77  Temp:  97.9 F (36.6 C)    TempSrc:  Oral    Resp: 21 11 23 13   Height:      Weight:   209 lb 14.1 oz (95.2 kg)   SpO2: 100% 100% 100% 100%    Intake/Output Summary (Last 24 hours) at 06/20/14 0700 Last data filed at 06/20/14 0600  Gross per 24 hour  Intake    230 ml  Output   2300 ml  Net  -2070 ml   Filed Weights   06/18/14 0440 06/19/14 0436 06/20/14 0500  Weight: 214 lb 1.1 oz (97.1 kg) 211 lb 3.2 oz (95.8 kg) 209 lb 14.1 oz (95.2 kg)    PHYSICAL EXAM  General: NAD, lying in bed. Neuro: Alert and oriented X 3. Moves all extremities spontaneously. HEENT:  Normal, Atraumatic, Normocephalic Neck: Supple , central ine- right IJ Lungs:  CTA Heart: Irregular  3/6 systolic murmur all 4 areas on auscultation but heard best aortic area, 2/6  diastolic murmur Abdomen: Soft, full, non-tender, Extremities: no edema, no edema, but wearing TED hose, and SCds  CBC  Recent Labs  06/19/14 1100 06/20/14 0455  WBC 8.4 7.8  HGB 8.1* 8.1*  HCT 24.5* 24.3*  MCV 89.7 90.3  PLT 142* 144   Basic Metabolic Panel  Recent Labs  06/19/14 0420 06/20/14 0455  NA 130* 130*  K 4.1 3.9  CL 97* 97*  CO2 24 24  GLUCOSE 116* 103*  BUN 24* 13  CREATININE 0.99 0.94  CALCIUM 8.6* 8.7*  Radiology/Studies  Dg Orthopantogram  06/19/2014   CLINICAL DATA:  Aortic stenosis, preop  EXAM: ORTHOPANTOGRAM/PANORAMIC  COMPARISON:  None available  FINDINGS: Multiple missing teeth and restorations. Caries involving teeth 3 and 4. Periapical lucency/erosion involving tooth number 13.  IMPRESSION: 1. Dental caries and periapical disease as above.   Electronically Signed   By: Lucrezia Europe [name]   On: 06/19/2014 16:52   Ct Head Wo Contrast  06/19/2014   CLINICAL DATA:  Patient with cognitive deficits and expressive aphasia.  EXAM: CT HEAD WITHOUT CONTRAST  TECHNIQUE: Contiguous axial images were obtained from the base of the skull through the vertex without intravenous contrast.  COMPARISON:  None.  FINDINGS: Ventricles and sulci are appropriate for patient's age. No evidence for acute cortically  based infarct, intracranial hemorrhage, mass lesion or mass-effect. Orbits are unremarkable. Polypoid mucosal thickening bilateral maxillary sinuses. Frontal sinus and ethmoid air cells are unremarkable. Mastoid air cells are well aerated. Calvarium is intact. Zygomatic arches are unremarkable.  IMPRESSION: No acute intracranial process.  Polypoid mucosal thickening paranasal sinuses.   Electronically Signed   By: Lovey Newcomer [name]   On: 06/19/2014 15:30   Dg Chest Port 1 View  06/16/2014   CLINICAL DATA:  Central line placement.  EXAM: PORTABLE CHEST - 1 VIEW  COMPARISON:  06/14/2014  FINDINGS: New right jugular central line is been placed with the tip in the upper right  atrium. No pneumothorax. Stable volume loss of the right lung with slightly more prominent mid lung atelectasis. Mild interstitial edema suspected. No pleural effusions. Stable cardiac enlargement.  IMPRESSION: Central line tip in upper right atrium. No pneumothorax. Slightly more prominent right mid lung atelectasis. Mild interstitial edema suspected.   Electronically Signed   By: Aletta Edouard [name]   On: 06/16/2014 13:58   Dg Chest Port 1 View  06/14/2014   CLINICAL DATA:  CHF exacerbation, history hypertension and prior AVR  EXAM: PORTABLE CHEST - 1 VIEW  COMPARISON:  Portable exam 2010 hours compared to 06/13/2014  FINDINGS: Enlargement of cardiac silhouette post median sternotomy.  Tortuous aorta.  Mediastinal contours and pulmonary vascularity normal.  Minimal bibasilar atelectasis.  No gross infiltrate, pleural effusion or pneumothorax.  IMPRESSION: Enlargement of cardiac silhouette with minimal bibasilar atelectasis.   Electronically Signed   By: Lavonia Dana [name]   On: 06/14/2014 20:50   TELE: a-fib, rates- 80s  Echo- TEE- 5/20- EF- 09-47%, Systolic function was normal. The estimated ejection fraction was in the range of 55% to 60%. Wall motion was normal; there were no regional wall motion abnormalities. - Aortic valve: St. Jude Mechanical AVR is present. Only one of the leaflets can be visualized as moving and that movement is significantly reduced. The other leaflet is immobile. There is severe aortic insufficiency that is best appreciated in the 120 degree view. 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  ASSESSMENT AND PLAN  69 year old male with h/o aortic root and aortic valve replacement at [facility] in 1993 with 25 mm St Jude valve conduit, who presented to [facility] 5/18 with acute CHF presumably sec to valve dysfunction.   1. 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 Cardiogenic Shock with diastolic CHF exacerbation- likely due to valve thrombosis 3. Acute GI bleed with melena with gastric cardia erosions noted on EGD 4. Chronic Atrial Fib 5. Thrombocytopenia 6. 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 The coronary reconstruction was with a 8 mm Gore-Tex graft Cabral type repair in 1993  - Cath results- No Obstructive disease, 10% stenosis- LAD and Circumflex. - Plans to transfuse 1 unit today. Follow hemoglobin. Stable at 8.1 today. S/p 2 units on the 22nd. - Given lovenox- 40 one dose, yesterday. Might be safe to cont Anticoag with heparin now with stable Hgb, as he also has atria Fib.  - Plan for high risk aortic valve replacement on Thursday with Dr. Prescott Gum.  Ronald Pippins, PGY-2 Resident. 7:00 AM As above, patient seen and examined. Patient denies chest pain or dyspnea. No further bleeding. He is scheduled for high risk redo aortic valve replacement tomorrow  because of valve thrombosis with associated aortic insufficiency/aortic stenosis. He has had no further GI bleeding. However his hemoglobin has decreased from 12.3 on admission to 8.1 this morning and he has also received 2 units of packed red blood cells during this hospitalization. A third unit has been ordered for today. I have therefore not resumed IV heparin to allow maximal time for healing of gastric cardia erosions noted on EGD prior to OR tomorrow. We will continue DVT prophylaxis Lovenox. [name]

## 2014-06-20 NOTE — Progress Notes (Signed)
CARDIAC REHAB PHASE I   PRE:  Rate/Rhythm: 86 afib    BP: sitting 94/65    SaO2: wouldn't register  MODE:  Ambulation: 80 ft   POST:  Rate/Rhythm: 133 afib    BP: sitting 120/68     SaO2: 98 RA on ear after 4 min  Pt agreeable to walk. He is generally weak. Needed assist x2 with gait belt to stand. Took small steps walking, used rollator, gait belt, assist x2 supervision. No c/o. Pt follows commands but pt does not seem able to make many decisions. To bed after walking.  HR to 133 afib walking. Asx. Pt will benefit from PT, esp after surgery. Discussed mobility and sternal precautions post op. Gave IS and instructions on use. Gave pt [facility] booklet and care guide however he declined desire to read it. Left for family along with video. [place], [place], ACSM 06/20/2014 2:33 PM

## 2014-06-21 ENCOUNTER — Encounter (HOSPITAL_COMMUNITY)
Admission: RE | Disposition: A | Discharge status: Rehabilitation Facility | Payer: Self-pay | Source: Other Acute Inpatient Hospital | Attending: Cardiology

## 2014-06-21 ENCOUNTER — Inpatient Hospital Stay (HOSPITAL_COMMUNITY): Payer: Commercial Managed Care - HMO | Admitting: Anesthesiology

## 2014-06-21 ENCOUNTER — Inpatient Hospital Stay (HOSPITAL_COMMUNITY): Payer: Commercial Managed Care - HMO

## 2014-06-21 ENCOUNTER — Encounter (HOSPITAL_COMMUNITY): Payer: Self-pay | Admitting: General Surgery

## 2014-06-21 DIAGNOSIS — I351 Nonrheumatic aortic (valve) insufficiency: Secondary | ICD-10-CM | POA: Insufficient documentation

## 2014-06-21 DIAGNOSIS — R4182 Altered mental status, unspecified: Secondary | ICD-10-CM | POA: Insufficient documentation

## 2014-06-21 DIAGNOSIS — K253 Acute gastric ulcer without hemorrhage or perforation: Secondary | ICD-10-CM

## 2014-06-21 DIAGNOSIS — J9600 Acute respiratory failure, unspecified whether with hypoxia or hypercapnia: Secondary | ICD-10-CM

## 2014-06-21 DIAGNOSIS — I10 Essential (primary) hypertension: Secondary | ICD-10-CM | POA: Insufficient documentation

## 2014-06-21 HISTORY — PX: ESOPHAGOGASTRODUODENOSCOPY: SHX5428

## 2014-06-21 LAB — CBC
HCT: 21.2 % — ABNORMAL LOW (ref 39.0–52.0)
HCT: 23.6 % — ABNORMAL LOW (ref 39.0–52.0)
Hemoglobin: 7.2 g/dL — ABNORMAL LOW (ref 13.0–17.0)
Hemoglobin: 8.1 g/dL — ABNORMAL LOW (ref 13.0–17.0)
MCH: 30.8 pg (ref 26.0–34.0)
MCH: 29.2 pg (ref 26.0–34.0)
MCHC: 34 g/dL (ref 30.0–36.0)
MCHC: 34.3 g/dL (ref 30.0–36.0)
MCV: 90.6 fL (ref 78.0–100.0)
MCV: 85.2 fL (ref 78.0–100.0)
PLATELETS: 137 10*3/uL — AB (ref 150–400)
Platelets: 160 10*3/uL (ref 150–400)
RBC: 2.34 MIL/uL — ABNORMAL LOW (ref 4.22–5.81)
RBC: 2.77 MIL/uL — ABNORMAL LOW (ref 4.22–5.81)
RDW: 17.4 % — ABNORMAL HIGH (ref 11.5–15.5)
RDW: 18.2 % — AB (ref 11.5–15.5)
WBC: 8.7 10*3/uL (ref 4.0–10.5)
WBC: 10.9 10*3/uL — ABNORMAL HIGH (ref 4.0–10.5)

## 2014-06-21 LAB — PREPARE RBC (CROSSMATCH)

## 2014-06-21 LAB — BASIC METABOLIC PANEL
ANION GAP: 9 (ref 5–15)
BUN: 24 mg/dL — ABNORMAL HIGH (ref 6–20)
CO2: 22 mmol/L (ref 22–32)
Calcium: 7.9 mg/dL — ABNORMAL LOW (ref 8.9–10.3)
Chloride: 99 mmol/L — ABNORMAL LOW (ref 101–111)
Creatinine, Ser: 1.04 mg/dL (ref 0.61–1.24)
GFR calc Af Amer: >60 mL/min (ref >60)
Glucose, Bld: 133 mg/dL — ABNORMAL HIGH (ref 65–99)
POTASSIUM: 3.8 mmol/L (ref 3.5–5.1)
Sodium: 130 mmol/L — ABNORMAL LOW (ref 135–145)

## 2014-06-21 LAB — GLUCOSE, CAPILLARY: Glucose-Capillary: 133 mg/dL — ABNORMAL HIGH (ref 65–99)

## 2014-06-21 LAB — APTT: aPTT: 31 seconds (ref 24–37)

## 2014-06-21 LAB — TRIGLYCERIDES: Triglycerides: 61 mg/dL (ref <150)

## 2014-06-21 LAB — PROTIME-INR
INR: 1.37 (ref 0.00–1.49)
Prothrombin Time: 17 seconds — ABNORMAL HIGH (ref 11.6–15.2)

## 2014-06-21 LAB — SURGICAL PCR SCREEN
MRSA, PCR: NEGATIVE
Staphylococcus aureus: NEGATIVE

## 2014-06-21 SURGERY — EGD (ESOPHAGOGASTRODUODENOSCOPY)
Anesthesia: General

## 2014-06-21 SURGERY — REDO STERNOTOMY
Anesthesia: General

## 2014-06-21 SURGERY — EGD (ESOPHAGOGASTRODUODENOSCOPY)
Anesthesia: Moderate Sedation

## 2014-06-21 MED ORDER — PROPOFOL 10 MG/ML IV BOLUS
INTRAVENOUS | Status: AC
  Filled 2014-06-21: qty 20

## 2014-06-21 MED ORDER — SODIUM CHLORIDE 0.9 % IV SOLN
INTRAVENOUS | Status: DC
  Administered 2014-06-21: 10:00:00 via INTRAVENOUS

## 2014-06-21 MED ORDER — FENTANYL CITRATE (PF) 100 MCG/2ML IJ SOLN
INTRAMUSCULAR | Status: DC | PRN
  Administered 2014-06-21: 12.5 ug via INTRAVENOUS

## 2014-06-21 MED ORDER — SUCCINYLCHOLINE CHLORIDE 20 MG/ML IJ SOLN
INTRAMUSCULAR | Status: DC | PRN
  Administered 2014-06-21: 80 mg via INTRAVENOUS

## 2014-06-21 MED ORDER — FENTANYL CITRATE (PF) 100 MCG/2ML IJ SOLN: 50.0000 ug | INTRAMUSCULAR | Status: DC | PRN

## 2014-06-21 MED ORDER — EPINEPHRINE HCL 0.1 MG/ML IJ SOSY
PREFILLED_SYRINGE | INTRAMUSCULAR | Status: AC
  Filled 2014-06-21: qty 10

## 2014-06-21 MED ORDER — PANTOPRAZOLE SODIUM 40 MG IV SOLR: 40.0000 mg | Freq: Two times a day (BID) | INTRAVENOUS | Status: DC

## 2014-06-21 MED ORDER — FENTANYL CITRATE (PF) 100 MCG/2ML IJ SOLN
INTRAMUSCULAR | Status: DC | PRN
  Administered 2014-06-21 (×2): 50 ug via INTRAVENOUS

## 2014-06-21 MED ORDER — DEXMEDETOMIDINE HCL IN NACL 200 MCG/50ML IV SOLN
0.4000 ug/kg/h | INTRAVENOUS | Status: DC
  Administered 2014-06-21: 0.4 ug/kg/h via INTRAVENOUS
  Administered 2014-06-21: 0.6 ug/kg/h via INTRAVENOUS
  Administered 2014-06-22: 0.4 ug/kg/h via INTRAVENOUS
  Filled 2014-06-21 (×2): qty 50

## 2014-06-21 MED ORDER — FENTANYL CITRATE (PF) 250 MCG/5ML IJ SOLN
INTRAMUSCULAR | Status: AC
  Filled 2014-06-21: qty 5

## 2014-06-21 MED ORDER — PHENYLEPHRINE HCL 10 MG/ML IJ SOLN
0.0000 ug/min | INTRAVENOUS | Status: DC
  Administered 2014-06-21: 70 ug/min via INTRAVENOUS
  Administered 2014-06-22 – 2014-06-23 (×3): 90 ug/min via INTRAVENOUS
  Administered 2014-06-23: 50 ug/min via INTRAVENOUS
  Administered 2014-06-23: 100 ug/min via INTRAVENOUS
  Filled 2014-06-21 (×7): qty 4

## 2014-06-21 MED ORDER — MIDAZOLAM HCL 10 MG/2ML IJ SOLN
INTRAMUSCULAR | Status: DC | PRN
  Administered 2014-06-21: 1 mg via INTRAVENOUS

## 2014-06-21 MED ORDER — MIDAZOLAM HCL 5 MG/ML IJ SOLN
INTRAMUSCULAR | Status: AC
  Filled 2014-06-21: qty 2

## 2014-06-21 MED ORDER — PHENYLEPHRINE HCL 10 MG/ML IJ SOLN
INTRAMUSCULAR | Status: AC | PRN
  Administered 2014-06-21: 40 ug via INTRAVENOUS
  Administered 2014-07-05: 80 ug via INTRAVENOUS
  Administered 2014-07-05 (×4): 40 ug via INTRAVENOUS

## 2014-06-21 MED ORDER — DIPHENHYDRAMINE HCL 50 MG/ML IJ SOLN
INTRAMUSCULAR | Status: AC
Start: 2014-06-21 — End: 2014-06-21
  Filled 2014-06-21: qty 1

## 2014-06-21 MED ORDER — EPINEPHRINE HCL 1 MG/ML IJ SOLN
INTRAMUSCULAR | Status: DC | PRN
  Administered 2014-06-21: 1 mL

## 2014-06-21 MED ORDER — ROCURONIUM BROMIDE 50 MG/5ML IV SOLN
INTRAVENOUS | Status: AC
  Filled 2014-06-21: qty 2

## 2014-06-21 MED ORDER — BIOTENE DRY MOUTH MT LIQD
15.0000 mL | OROMUCOSAL | Status: DC
  Administered 2014-06-21 – 2014-06-22 (×4): 15 mL via OROMUCOSAL

## 2014-06-21 MED ORDER — ROCURONIUM BROMIDE 100 MG/10ML IV SOLN
INTRAVENOUS | Status: DC | PRN
  Administered 2014-06-21: 30 mg via INTRAVENOUS
  Administered 2014-06-21: 20 mg via INTRAVENOUS

## 2014-06-21 MED ORDER — MIDAZOLAM HCL 10 MG/2ML IJ SOLN
INTRAMUSCULAR | Status: AC
  Filled 2014-06-21: qty 2

## 2014-06-21 MED ORDER — PHENYLEPHRINE HCL 10 MG/ML IJ SOLN
10.0000 mg | INTRAVENOUS | Status: DC | PRN
  Administered 2014-06-21: 50 ug/min via INTRAVENOUS

## 2014-06-21 MED ORDER — MIDAZOLAM HCL 5 MG/5ML IJ SOLN
INTRAMUSCULAR | Status: DC | PRN
  Administered 2014-06-21: 2 mg via INTRAVENOUS

## 2014-06-21 MED ORDER — SODIUM CHLORIDE 0.9 % IV SOLN
8.0000 mg/h | INTRAVENOUS | Status: DC
  Administered 2014-06-21 – 2014-06-24 (×6): 8 mg/h via INTRAVENOUS
  Filled 2014-06-21 (×14): qty 80

## 2014-06-21 MED ORDER — SODIUM CHLORIDE 0.9 % IV SOLN
Freq: Once | INTRAVENOUS | Status: AC
  Administered 2014-06-21: 05:00:00 via INTRAVENOUS

## 2014-06-21 MED ORDER — FENTANYL CITRATE (PF) 100 MCG/2ML IJ SOLN
INTRAMUSCULAR | Status: AC | PRN
  Administered 2014-06-21: 50 ug via INTRAVENOUS

## 2014-06-21 MED ORDER — DIPHENHYDRAMINE HCL 50 MG/ML IJ SOLN
INTRAMUSCULAR | Status: DC | PRN
  Administered 2014-06-21: 25 mg via INTRAVENOUS

## 2014-06-21 MED ORDER — SODIUM CHLORIDE 0.9 % IV BOLUS (SEPSIS)
500.0000 mL | Freq: Once | INTRAVENOUS | Status: AC
  Administered 2014-06-21: 500 mL via INTRAVENOUS

## 2014-06-21 MED ORDER — PROPOFOL 1000 MG/100ML IV EMUL: 0.0000 ug/kg/min | INTRAVENOUS | Status: DC

## 2014-06-21 MED ORDER — LACTATED RINGERS IV SOLN
INTRAVENOUS | Status: AC | PRN
  Administered 2014-06-21: 07:00:00 via INTRAVENOUS

## 2014-06-21 MED ORDER — MIDAZOLAM HCL 5 MG/5ML IJ SOLN
INTRAMUSCULAR | Status: AC | PRN
  Administered 2014-06-21: 1 mg via INTRAVENOUS

## 2014-06-21 MED ORDER — PHENYLEPHRINE HCL 10 MG/ML IJ SOLN
0.0000 ug/min | INTRAMUSCULAR | Status: DC
  Administered 2014-06-21: 20 ug/min via INTRAVENOUS
  Filled 2014-06-21 (×2): qty 1

## 2014-06-21 MED ORDER — FENTANYL CITRATE (PF) 100 MCG/2ML IJ SOLN
INTRAMUSCULAR | Status: AC
  Filled 2014-06-21: qty 2

## 2014-06-21 MED ORDER — HEPARIN SODIUM (PORCINE) 1000 UNIT/ML IJ SOLN
INTRAMUSCULAR | Status: AC
  Filled 2014-06-21: qty 1

## 2014-06-21 MED ORDER — ETOMIDATE 2 MG/ML IV SOLN
INTRAVENOUS | Status: DC | PRN
  Administered 2014-06-21: 20 mg via INTRAVENOUS

## 2014-06-21 MED ORDER — DEXMEDETOMIDINE HCL IN NACL 400 MCG/100ML IV SOLN
0.4000 ug/kg/h | INTRAVENOUS | Status: DC
  Filled 2014-06-21: qty 100

## 2014-06-21 MED ORDER — CHLORHEXIDINE GLUCONATE 0.12 % MT SOLN
15.0000 mL | Freq: Two times a day (BID) | OROMUCOSAL | Status: DC
  Administered 2014-06-21: 15 mL via OROMUCOSAL
  Filled 2014-06-21: qty 15

## 2014-06-21 MED ORDER — MIDAZOLAM HCL 2 MG/2ML IJ SOLN
INTRAMUSCULAR | Status: AC
  Filled 2014-06-21: qty 2

## 2014-06-21 MED ORDER — LACTATED RINGERS IV SOLN
INTRAVENOUS | Status: DC | PRN
  Administered 2014-06-21: 14:00:00 via INTRAVENOUS

## 2014-06-21 NOTE — Progress Notes (Signed)
Daily Rounding Note  06/21/2014, 8:28 AM  LOS: 7 days   SUBJECTIVE:       Pt having melena. Hx duodenal ulcer bleed (vv was clipped/epi injected on EGD) in 08/2012 (setting of NSAIDs and ETOH).  + HPylori was treated.  S/p 06/18/14 EGD for melena and ABL anemia when integrillin, heparin on board. .  Showed bleeding erosions in gastric cardia, mild bulbar duodenitis, slight esophageal Z line nodularity. Placed and remains on IV BID Protonix since 5/22. Lovenox started 5/24.  Low dose ASA never discontinued until this AM.    Cardiac cath on 5/24: no significant CAD.  Was for redo AVR today (hx mechanical AVR but pt stopped Coumadin and developed valve thrombosis and presented with cardiogennic shock 5/19).   However, due to recurrent melenic stool last night, surgery cancelled. Hgb 8.3 to 8.1 to 7.2 in last 3 days.  BUN 24, up from 13 yesterday but as high as 58 on 5/22.   Pt had not had a BM in many days and got Lactulose at 10 AM yesterday.  At 6 PM had single loose tarry stool, none since.   Will have received 5  PRBCs by this afternoon, 2 in last 36 hours and one currently transfusing.   Pt denies n/v, no abd or CO.  No SOB. Appetite diminished.   Had Colonoscopy 12/2012 by Dr Barney Drain: cecal tubular adenoma, mild descending and sigmoid tics, redundant colon, external rrhoids.   OBJECTIVE:         Vital signs in last 24 hours:    Temp:  [96.1 F (35.6 C)-98.2 F (36.8 C)] 98 F (36.7 C) (05/26 0625) Pulse Rate:  [25-114] 92 (05/26 0732) Resp:  [12-25] 17 (05/26 0630) BP: (71-127)/(40-90) 108/59 mmHg (05/26 0630) SpO2:  [92 %-100 %] 100 % (05/26 0630) Weight:  [207 lb 10.8 oz (94.2 kg)] 207 lb 10.8 oz (94.2 kg) (05/26 0343) Last BM Date: 06/20/14 Filed Weights   06/19/14 0436 06/20/14 0500 06/21/14 0343  Weight: 211 lb 3.2 oz (95.8 kg) 209 lb 14.1 oz (95.2 kg) 207 lb 10.8 oz (94.2 kg)   General: unwell but not in any  distress.     Heart: Irreg, irrge Chest: a few crackles in left base.  Overall diminished BS.  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  BS active  Extremities: no CCE Neuro/Psych:  Cooperative, oriented x 3.  No tremor or gross deficits.   Intake/Output from previous day: 05/25 0701 - 05/26 0700 In: 1480 [P.O.:620; I.V.:250; Blood:610] Out: 1100 [Urine:950; Stool:150]  Intake/Output this shift:    Lab Results:  Recent Labs  06/19/14 1100 06/20/14 0455 06/21/14 0236  WBC 8.4 7.8 8.7  HGB 8.1* 8.1* 7.2*  HCT 24.5* 24.3* 21.2*  PLT 142* 159 160   BMET  Recent Labs  06/19/14 0420 06/20/14 0455 06/21/14 0236  NA 130* 130* 130*  K 4.1 3.9 3.8  CL 97* 97* 99*  CO2 24 24 22   GLUCOSE 116* 103* 133*  BUN 24* 13 24*  CREATININE 0.99 0.94 1.04  CALCIUM 8.6* 8.7* 7.9*   LFT No results for input(s): PROT, ALBUMIN, AST, ALT, ALKPHOS, BILITOT, BILIDIR, IBILI in the last 72 hours. PT/INR  Recent Labs  06/21/14 0236  LABPROT 17.0*  INR 1.37    Studies/Results: Dg Orthopantogram  06/19/2014   CLINICAL DATA:  Aortic stenosis, preop  EXAM: ORTHOPANTOGRAM/PANORAMIC  COMPARISON:  None available  FINDINGS: Multiple missing teeth and restorations. Caries involving teeth 3 and 4. Periapical lucency/erosion involving tooth number 13.  IMPRESSION: 1. Dental caries and periapical disease as above.   Electronically Signed   By: Lucrezia Europe [name]   On: 06/19/2014 16:52   Ct Head Wo Contrast  06/19/2014   CLINICAL DATA:  Patient with cognitive deficits and expressive aphasia.  EXAM: CT HEAD WITHOUT CONTRAST  TECHNIQUE: Contiguous axial images were obtained from the base of the skull through the vertex without intravenous contrast.  COMPARISON:  None.  FINDINGS: Ventricles and sulci are appropriate for patient's age. No evidence for acute cortically based infarct, intracranial hemorrhage, mass lesion or mass-effect. Orbits are unremarkable.  Polypoid mucosal thickening bilateral maxillary sinuses. Frontal sinus and ethmoid air cells are unremarkable. Mastoid air cells are well aerated. Calvarium is intact. Zygomatic arches are unremarkable.  IMPRESSION: No acute intracranial process.  Polypoid mucosal thickening paranasal sinuses.   Electronically Signed   By: Lovey Newcomer [name]   On: 06/19/2014 15:30   \ Scheduled Meds: . aminocaproic acid (AMICAR) for [facility]   Intravenous To OR  . [MAR Hold] atorvastatin  40 mg Oral q1800  . bisacodyl  5 mg Oral Once  . cefUROXime (ZINACEF)  IV  1.5 g Intravenous To OR  . cefUROXime (ZINACEF)  IV  750 mg Intravenous To OR  . dexmedetomidine  0.1-0.7 mcg/kg/hr Intravenous To OR  . DOPamine  0-10 mcg/kg/min Intravenous To OR  . epinephrine  0-10 mcg/min Intravenous To OR  . heparin-papaverine-plasmalyte irrigation   Irrigation To OR  . heparin 30,000 units/NS 1000 mL solution for CELLSAVER   Other To OR  . insulin (NOVOLIN-R) infusion   Intravenous To OR  . magnesium sulfate  40 mEq Other To OR  . nitroGLYCERIN  2-200 mcg/min Intravenous To OR  . [MAR Hold] pantoprazole (PROTONIX) IV  40 mg Intravenous BID  . phenylephrine (NEO-SYNEPHRINE) Adult infusion  30-200 mcg/min Intravenous To OR  . potassium chloride  80 mEq Other To OR  . [MAR Hold] sodium chloride  3 mL Intravenous Q12H  . vancomycin  1,500 mg Intravenous To OR   Continuous Infusions: . [MAR Hold] norepinephrine (LEVOPHED) Adult infusion Stopped (06/19/14 0544)   PRN Meds:.[MAR Hold] sodium chloride, [MAR Hold] sodium chloride, [MAR Hold] acetaminophen, [MAR Hold] ondansetron (ZOFRAN) IV, [MAR Hold] sodium chloride   ASSESMENT:   *  GIB. EGD 5/23 with gastric erosions and mild duodenitis.  On BID PPI.  Now with ? recurrent bleeding (loose black stools post lactulose and not having had a BM since EGD, but Hgb drop of 1 gram in 24 hours).    *  ABL anemia.  By this afternoon, will have gotten 3 units blood over last 24 hours.   Other source of blood loss include the cardiac cath on 5/24.  *  Thrombosed mechanical AoV.  Surgery for redo valve and redo aortic root replacement postponed.   PLAN   *  EGD today.     [name]  06/21/2014, 8:28 AM Pager: [phone]

## 2014-06-21 NOTE — Progress Notes (Signed)
bp in the mid 70s and 80s lately,pt denied any symptoms-hgb back down to 7.2 w/ order to transfuse/dr Claiborne Billings.Pt had black tarry stool yesterday pm.

## 2014-06-21 NOTE — Progress Notes (Signed)
eLink Physician-Brief Progress Note Patient Name: [name] DOB: [birth year] MRN: [id number]   Date of Service  06/21/2014  HPI/Events of Note  Patient intubated for endoscopy, attempted SBT upon arrival to the floor, noted to be tachypneic (40s) and with desaturations  eICU Interventions  Cont with full vent support, will wean as tolerated.      Intervention Category Intermediate Interventions: Respiratory distress - evaluation and management  Dameion Briles 06/21/2014, 6:41 PM

## 2014-06-21 NOTE — Anesthesia Preprocedure Evaluation (Addendum)
Anesthesia Evaluation  Patient identified by MRN, date of birth, ID band Patient awake    Reviewed: Allergy & Precautions, NPO status , Patient's Chart, lab work & pertinent test results  Airway Mallampati: III   Neck ROM: full    Dental  (+) Poor Dentition, Partial Upper, Partial Lower, Missing, Dental Advisory Given   Pulmonary former smoker,  breath sounds clear to auscultation        Cardiovascular hypertension, + CABG, + Peripheral Vascular Disease and +CHF + Valvular Problems/Murmurs AS and AI Rhythm:regular Rate:Normal  S/p AVR with St Jude valve in 1993.  Now the valve is dysfunctional and pt has severe AI and some AS.   Neuro/Psych    GI/Hepatic PUD,   Endo/Other  obese  Renal/GU      Musculoskeletal   Abdominal   Peds  Hematology   Anesthesia Other Findings   Reproductive/Obstetrics                           Anesthesia Physical  Anesthesia Plan  ASA: IV  Anesthesia Plan: General   Post-op Pain Management:    Induction: Intravenous  Airway Management Planned: Oral ETT  Additional Equipment: Arterial line and CVP  Intra-op Plan:   Post-operative Plan: Post-operative intubation/ventilation  Informed Consent: I have reviewed the patients History and Physical, chart, labs and discussed the procedure including the risks, benefits and alternatives for the proposed anesthesia with the patient or authorized representative who has indicated his/her understanding and acceptance.     Plan Discussed with: CRNA, Anesthesiologist and Surgeon  Anesthesia Plan Comments:         Anesthesia Quick Evaluation

## 2014-06-21 NOTE — Progress Notes (Signed)
Day of Surgery Procedure(s) (LRB): REDO STERNOTOMY (N/A) REDO ASCENDING AORTIC ROOT REPLACEMENT W/ RIGHT AXILLARY CANNULATION (N/A) TRANSESOPHAGEAL ECHOCARDIOGRAM (TEE) (N/A) Subjective: The patient has resumed active GI bleeding with melanotic stool last pm  hypotension and hb 7.2  His operation is high risk but not an emergency and his medical condition needs to be optimized preop to allow the patient the best chance of survival  Will hold surgery today and continue medical therapy of prosthetic valve AI and ask for general surgical eval of oversewing/resection of gastric ulcer which has been a problem since 2014. He may not be operable.  Objective: Vital signs in last 24 hours: Temp:  [96.1 F (35.6 C)-98.2 F (36.8 C)] 98 F (36.7 C) (05/26 0625) Pulse Rate:  [25-114] 51 (05/26 0705) Cardiac Rhythm:  [-] Atrial fibrillation (05/26 0400) Resp:  [12-25] 17 (05/26 0630) BP: (71-127)/(40-90) 108/59 mmHg (05/26 0630) SpO2:  [92 %-100 %] 100 % (05/26 0630) Weight:  [207 lb 10.8 oz (94.2 kg)] 207 lb 10.8 oz (94.2 kg) (05/26 0343)  Hemodynamic parameters for last 24 hours: CVP:  [5 mmHg-8 mmHg] 5 mmHg  Intake/Output from previous day: 05/25 0701 - 05/26 0700 In: 1480 [P.O.:620; I.V.:250; Blood:610] Out: 1100 [Urine:950; Stool:150] Intake/Output this shift:    No distress Lying comfortably in bed  Lab Results:  Recent Labs  06/20/14 0455 06/21/14 0236  WBC 7.8 8.7  HGB 8.1* 7.2*  HCT 24.3* 21.2*  PLT 159 160   BMET:  Recent Labs  06/20/14 0455 06/21/14 0236  NA 130* 130*  K 3.9 3.8  CL 97* 99*  CO2 24 22  GLUCOSE 103* 133*  BUN 13 24*  CREATININE 0.94 1.04  CALCIUM 8.7* 7.9*    PT/INR:  Recent Labs  06/21/14 0236  LABPROT 17.0*  INR 1.37   ABG    Component Value Date/Time   PHART 7.440 06/19/2014 0808   HCO3 22.3 06/19/2014 0823   TCO2 23 06/19/2014 0823   ACIDBASEDEF 2.0 06/19/2014 0823   O2SAT 59.3 06/20/2014 0500   CBG (last 3)   Recent  Labs  06/20/14 1203 06/20/14 1644  GLUCAP 117* 114*    Assessment/Plan: S/P Procedure(s) (LRB): REDO STERNOTOMY (N/A) REDO ASCENDING AORTIC ROOT REPLACEMENT W/ RIGHT AXILLARY CANNULATION (N/A) TRANSESOPHAGEAL ECHOCARDIOGRAM (TEE) (N/A) Delay surgery for AI due to active GI bleeding which would increrase his perioperative  risks of coagulopathy- bleeeding  I will ask for Gen surgery eval of bleeding gastric ulcer   LOS: 7 days    Tharon Aquas Trigt III 06/21/2014

## 2014-06-21 NOTE — Progress Notes (Signed)
I've spoken to Dr. Stanford Breed with cardiology, Dr. Nelda Marseille with critical care medicine and also anesthesiology. Spoke to the patient's daughter, [name], regarding endoscopy and the plan for intubation for airway protection during the case I discussed the risks, benefits and alternatives with Mr. Cork and his daughter [name].  They understand and agreed to proceed with endoscopy with general anesthesia Critical care medicine will see the patient when he returns to the cardiac intensive care unit likely still on the ventilator

## 2014-06-21 NOTE — Progress Notes (Signed)
Patient had very small 1 loose melanotic stool this morning. 30 mL when measured. Currently infusing packed RBC's per order. Repeat CBC to follow transfusions.  Patient to go for endo procedure shortly.   Roxan Hockey, RN

## 2014-06-21 NOTE — Op Note (Signed)
[facility], [id number]   ENDOSCOPY PROCEDURE REPORT  PATIENT: [name], [name]  MR#: [id number] BIRTHDATE: 01/10/1946 , 69  yrs. old GENDER: male ENDOSCOPIST: Jerene Bears, MD REFERRED BY:  Dr. Prescott Gum and Dr. Stanford Breed PROCEDURE DATE:  06/21/2014 PROCEDURE:  EGD, diagnostic ASA CLASS:     Class III INDICATIONS:  melena and acute posthemorrhagic anemia with known gastric cardia erosion. MEDICATIONS: Benadryl 25 mg IV, Fentanyl 12.5 mcg IV, and Versed 1 mg IV TOPICAL ANESTHETIC: none  DESCRIPTION OF PROCEDURE: After the risks benefits and alternatives of the procedure were thoroughly explained, informed consent was obtained.  The PENTAX GASTROSCOPE [id number] endoscope was introduced through the mouth and advanced to the second portion of the duodenum , Without limitations.  The instrument was slowly withdrawn as the mucosa was fully examined.      ESOPHAGUS: Blood was found refluxing from the stomach.  No esophageal lesion seen.  STOMACH: Large blood clot in the proximal stomach actively refluxing into the esophagus. Gastric cardia erosion briefly visible. Decision made to abort the procedure due to concern of aspiration.   COMPLICATIONS: There were no immediate complications.  ENDOSCOPIC IMPRESSION: 1.   Large gastric blood clot with erosion visible inferior to the GE junction.  Procedure aborted due to concern for aspiration   RECOMMENDATIONS: 1.   Repeat the procedure with general anesthesia for airway protection and hemodynamic support  eSigned:  Jerene Bears, MD 06/21/2014 3:45 PM    ]

## 2014-06-21 NOTE — Progress Notes (Signed)
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 He was tx to [facility] 5/19 from [place] with cardiogenic shock r/t severe aortic valve insufficiency/ partially thrombosed mechanical AoV after stopping coumadin for unclear reasons several months prior.  He was treated with integrelin, asa and heparin and was scheduled for AVR 5/26 but developed recurrent melena and surgery put on hold.  He was seen in consultation by GI and underwent endoscopy 5/26 with clipping of gastric erosion under general anesthesia and PCCM re-consulted for post-procedure vent management.   STUDIES:  CT head 5/24>>> negative  Dental orthopantogram 5/24>>> Dental caries and periapical disease as above.                                                                                                                                                                                                                                                                                                 SIGNIFICANT EVENTS: 5/26>> EGD with clipping (x4) of gastric erosion Cath 5/20>>> - No Obstructive disease, 10% stenosis- LAD and Circumflex.  SUBJECTIVE:  Sedated post procedure.   VITAL SIGNS: Temp:  [97.7 F (36.5 C)-98.6 F (37 C)] 98.6 F (37 C) (05/26 1545) Pulse Rate:  [25-124] 96 (05/26 1545) Resp:  [8-25] 20 (05/26 1545) BP: (71-120)/(40-85) 88/63 mmHg (05/26 1545) SpO2:  [100 %] 100 % (05/26 1545) Arterial Line BP: (98-162)/(49-91) 162/91 mmHg (05/26 1530) FiO2 (%):  [100 %] 100 % (05/26 1536) Weight:  [207 lb 10.8 oz (94.2 kg)] 207 lb 10.8 oz (94.2 kg) (05/26 0343) HEMODYNAMICS: CVP:  [5 mmHg] 5 mmHg VENTILATOR SETTINGS: Vent Mode:  [-] PRVC FiO2 (%):  [100 %] 100 % Set Rate:  [16 bmp] 16 bmp Vt Set:  [550 mL] 550  mL PEEP:  [5 cmH20] 5 cmH20 Plateau Pressure:  [21 cmH20] 21 cmH20 INTAKE / OUTPUT:  Intake/Output Summary (Last 24 hours) at 06/21/14 1609 Last data  filed at 06/21/14 1530  Gross per 24 hour  Intake 1837.5 ml  Output   1100 ml  Net  737.5 ml    PHYSICAL EXAMINATION: General:   chronically ill appearing male, NAD post EGD Neuro:  Sedated post EGD, RASS -3 HEENT:  Mm moist, no JVD, ETT Cardiovascular:  Y7C6 3/6 systolic murmur  Lungs:  resps even non labored on full vent support, clear anteriorly  Abdomen:  Round, mildly distended, +bs  Musculoskeletal:  Warm and dry, no edema   LABS:  CBC  Recent Labs Lab 06/19/14 1100 06/20/14 0455 06/21/14 0236  WBC 8.4 7.8 8.7  HGB 8.1* 8.1* 7.2*  HCT 24.5* 24.3* 21.2*  PLT 142* 159 160   Coag's  Recent Labs Lab 06/15/14 1336 06/21/14 0236  APTT  --  31  INR 1.67* 1.37   BMET  Recent Labs Lab 06/19/14 0420 06/20/14 0455 06/21/14 0236  NA 130* 130* 130*  K 4.1 3.9 3.8  CL 97* 97* 99*  CO2 24 24 22   BUN 24* 13 24*  CREATININE 0.99 0.94 1.04  GLUCOSE 116* 103* 133*   Electrolytes  Recent Labs Lab 06/15/14 0112  06/19/14 0420 06/20/14 0455 06/21/14 0236  CALCIUM  --   < > 8.6* 8.7* 7.9*  MG 2.1  --   --   --   --   < > = values in this interval not displayed. Sepsis Markers  Recent Labs Lab 06/14/14 2105  LATICACIDVEN 2.3*   ABG  Recent Labs Lab 06/15/14 1421 06/19/14 0808  PHART 7.370 7.440  PCO2ART 40.2 31.6*  PO2ART 123.0* 77.0*   Liver Enzymes  Recent Labs Lab 06/15/14 1336  AST 40  ALT 27  ALKPHOS 51  BILITOT 1.9*  ALBUMIN 3.4*   Cardiac Enzymes  Recent Labs Lab 06/14/14 2105 06/15/14 0112 06/15/14 0729  TROPONINI 1.28* 1.33* 1.09*   Glucose  Recent Labs Lab 06/20/14 1203 06/20/14 1644  GLUCAP 117* 114*    Imaging No results found.   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  Cardiogenic shock  Acute on chronic dCHF  Afib  Hx type A aortic dissection Hypotension   P:  Cont hold anticoagulation for now  For AVR at some point - will touch base with CVTS  Low dose neosynephrine to keep MAP >65  RENAL Hyponatremia  P:   F/u chem   GASTROINTESTINAL GI bleeding - gastric/duodenal ulcer s/p EGD with clipping 5/26 P:   Cont protonix gtt  GI following  Stat CBC  HEMATOLOGIC Blood loss anemia - r/t GI bleeding  Prosthetic aortic valve thrombus  P:  PRBC as needed  Trend cbc  Holding anticoagulation  SCD's   INFECTIOUS No indication active infection  P:   Monitor wbc, fever curve  HCAP prevention   ENDOCRINE No active issue  P:   Monitor glucose on chem   NEUROLOGIC No active issue  P:   RASS goal: -1 Precedex, PRN  Continue sedation overnight while on vent  WUA in am  Plan for extubation in am if passes SBT    FAMILY  - Updates: no  family available 5/26  - Inter-disciplinary family meet or Palliative Care meeting due by:  6/2  Nickolas Madrid, NP 06/21/2014  4:09 PM Pager: [phone] or [phone]  Intubated post endoscopy, weaning well.  CVTS will not take to OR in AM, will attempt extubation today.  Patient appears stable from a respiratory standpoint.  Lungs CTA bilaterally.  All other labs reviewed, see orders.  Discussed with Dr. Stanford Breed.  Will extubate if SBT is successful.  The patient is critically ill with multiple organ systems failure and requires high complexity decision making for assessment and support, frequent evaluation and titration of therapies, application of advanced monitoring technologies and extensive interpretation of multiple databases.   Critical Care  Time devoted to patient care services described in this note is  35  Minutes. This time reflects time of care of this signee Dr Jennet Maduro. This critical care time does not reflect procedure time, or teaching time or supervisory time of PA/NP/Med student/Med Resident etc but could involve care discussion time.  Rush Farmer, [name] [facility] Pulmonary/Critical Care Medicine. Pager: [phone]. After hours pager: [phone].

## 2014-06-21 NOTE — Op Note (Signed)
[facility] [place], [id number]   ENDOSCOPY PROCEDURE REPORT  PATIENT: [name], [name]  MR#: [id number] BIRTHDATE: Mar 29, 1945 , 69  yrs. old GENDER: male ENDOSCOPIST: Jerene Bears, MD REFERRED BY:  Dr. Prescott Gum and Dr. Stanford Breed PROCEDURE DATE:  06/21/2014 PROCEDURE:  EGD w/ control of bleeding ASA CLASS:     Class III INDICATIONS:  melena and acute post hemorrhagic anemia.  known gastric cardia erosion MEDICATIONS: Per Anesthesia TOPICAL ANESTHETIC: none  DESCRIPTION OF PROCEDURE: After the risks benefits and alternatives of the procedure were thoroughly explained, informed consent was obtained.  The Pentax [id number] 3.8 [id number] endoscope was introduced through the mouth and advanced to the second portion of the duodenum , Without limitations.  The instrument was slowly withdrawn as the mucosa was fully examined.   ESOPHAGUS: The mucosa of the esophagus appeared normal.  STOMACH: Fresh blood with large clot found in the gastric cardia and fundus. Extensive irrigation and lavage with therapeutic upper endoscope performed. The Jabier Mutton net was used to pull clot from the stomach via the mouth.  Eventually the proximal stomach was cleared revealing the gastric cardia erosion seen earlier this week. This now contained a visible vessel was actively oozing blood. Epinephrine was injected around the visible vessel with good treatment effect. 3 hemostatic clips were placed in retroflexion Tisseel the erosion and visible vessel. An additional hemostatic clip was placed at the top of the erosion in forward view. By the end of the procedure there was complete hemostasis.  The distal stomach again appeared unremarkable.  DUODENUM: The duodenal mucosa showed no abnormalities.  Retroflexed views revealed as previously described.     The scope was then withdrawn from the patient and the procedure completed.  COMPLICATIONS: There were no immediate  complications.  ENDOSCOPIC IMPRESSION: 1.   The mucosa of the esophagus appeared normal 2.   Large blood clot and fresh blood removed from the proximal stomach with irrigation, lavage, and Roth net 3.   Gastric cardia erosion with visible vessel treated with epinephrine injection and hemostatic clip -4 (as above) 4.   Unremarkable distal stomach 5.   The duodenal mucosa showed no abnormalities   RECOMMENDATIONS: 1.  Resume PPI drip for 24-48 hours 2.  Closely monitor hemoglobin, transfuse if necessary 3.  Critical care has been consulted for ventilator management as the patient will return to the ICU intubated and sedated  eSigned:  Jerene Bears, MD 06/21/2014 3:38 PM     PATIENT NAME:  [name], [name] MR#: [id number]

## 2014-06-21 NOTE — Progress Notes (Signed)
PT Cancellation Note  Patient Details Name: [name] MRN: [id number] DOB: 12/27/1945   Cancelled Treatment:    Reason Eval/Treat Not Completed: Patient at procedure or test/unavailable (pt currently in OR and will hold eval until next date given appropriate post-op)   [name] [facility] 06/21/2014, 6:55 AM [name], [place]

## 2014-06-21 NOTE — Transfer of Care (Signed)
Immediate Anesthesia Transfer of Care Note  Patient: [name]  Procedure(s) Performed: Procedure(s): ESOPHAGOGASTRODUODENOSCOPY (EGD) (N/A)  Patient Location: ICU  Anesthesia Type:General  Level of Consciousness: Patient remains intubated per anesthesia plan  Airway & Oxygen Therapy: Patient remains intubated per anesthesia plan and Patient placed on Ventilator (see vital sign flow sheet for setting)  Post-op Assessment: Report given to RN  Post vital signs: Reviewed and stable  Last Vitals:  Filed Vitals:   06/21/14 1536  BP:   Pulse: 92  Temp:   Resp: 19    Complications: No apparent anesthesia complications

## 2014-06-21 NOTE — Anesthesia Preprocedure Evaluation (Signed)
Anesthesia Evaluation  Patient identified by MRN, date of birth, ID band Patient awake    Reviewed: Allergy & Precautions, NPO status , Patient's Chart, lab work & pertinent test results  Airway Mallampati: II   Neck ROM: full    Dental   Pulmonary former smoker,  breath sounds clear to auscultation        Cardiovascular hypertension, + CABG, + Peripheral Vascular Disease and +CHF + Valvular Problems/Murmurs AS and AI Rhythm:regular Rate:Normal  S/p AVR with St Jude valve in 1993.  Now the valve is dysfunctional and pt has severe AI and some AS.   Neuro/Psych    GI/Hepatic PUD,   Endo/Other  obese  Renal/GU      Musculoskeletal   Abdominal   Peds  Hematology   Anesthesia Other Findings   Reproductive/Obstetrics                             Anesthesia Physical Anesthesia Plan  ASA: IV  Anesthesia Plan: General   Post-op Pain Management:    Induction: Intravenous  Airway Management Planned: Oral ETT  Additional Equipment: Arterial line, CVP, PA Cath, TEE and Ultrasound Guidance Line Placement  Intra-op Plan:   Post-operative Plan: Post-operative intubation/ventilation  Informed Consent: I have reviewed the patients History and Physical, chart, labs and discussed the procedure including the risks, benefits and alternatives for the proposed anesthesia with the patient or authorized representative who has indicated his/her understanding and acceptance.     Plan Discussed with: CRNA, Anesthesiologist and Surgeon  Anesthesia Plan Comments:         Anesthesia Quick Evaluation

## 2014-06-21 NOTE — Progress Notes (Signed)
Patient arrived back from endo, intubated. Patient still sleep on arrival.   Once anesthesia was starting to wear off, patient able to open eyes on command. Patient shook head "no" when asked was he in pain. Patient still drowsy, but awakens to voice and stimulation.   BP soft on arrival. BP dropped to 65/45 (51). 500 cc NS Bolus given per order.  Order for NEO already in place. NEO initiated and titrated as appropriate.   Precedex infusing at 0.6 on arrival. Will continue at this rate at this time.   Rechecked CBC shows HGB at 8.1 (post 2-units of blood transfusion, and 1 Liter of blood removed in endo). Will repeat CBC at midnight and again in a.m.  Failed SBT trial. Will try again in a.m.   Will continue to monitor patient closely.   Roxan Hockey, RN

## 2014-06-21 NOTE — Consult Note (Signed)
Reason for Consult: bleeding duodenal ulcer Referring Physician: Dr. 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  He stopped taking his coumadin about 3 months ago "because a doctor said I didn't need it."  He was started on a heparin drip, integrilin.  He then developed a GI bleed.  He had a endoscopy on 5/23 which showed two gastric ulcers without active bleeding. anticoagulation was stopped.  H&H on admission was 12.3/34.8.  Dropped to 7.4 on 5/22 and was transfused with 2 units of PRBCs.  Given additional 2 units of 5/24 for h&h of 8.3/25.4.  H&H this morning 7.2/21.2.  The patient reports a dark stool yesterday evening.  Denies nausea or vomiting.  Denies abdominal pain.  Denies previous GI bleeding while on coumadin, does not take NSAIDs.  Former tobacco use and 3-4 beers per day drinker.   He denies sob, chest pains or palpitations.  Currently receiving blood, 2 units total. INR is 1.37.  PLT 160.  The patient is hypotensive, heart rate 80s.  Alert, awake and does not appear in any acute distress.    Past Medical History  Diagnosis Date  . Chest pain   . Hypertension   . Aortic aneurysm 1993  . 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 Pneumonia   . Aortic valve replaced      At [facility]  . Hx of aortic aneurysm repair 06/14/2014    Past Surgical History  Procedure Laterality Date  . Abdominal aortic aneurysm repair  1993    At [facility], Unspecified  . Splenectomy    . Cardiac valve replacement      At [facility]  . Esophagogastroduodenoscopy  Aug 2014    [facility]: large duodenal ulcer with visible vessel, s/p clip and epi, erosive gastritis. +h.pylori serology, treated with amoxicillin and biaxin.   . Colonoscopy N/A 12/27/2012    Procedure: COLONOSCOPY;  Surgeon:  Danie Binder, MD;  Location: [facility] END[facility];  Service: Endoscopy;  Laterality: N/A;  8:45-moved to 855   . Tee without cardioversion N/A 06/15/2014    Procedure: TRANSESOPHAGEAL ECHOCARDIOGRAM (TEE);  Surgeon: Sueanne Margarita, MD;  Location: [facility] ENDOSCOPY;  Service: Cardiovascular;  Laterality: N/A;  . Cardiac catheterization N/A 06/15/2014    Procedure: Fluoroscopy Guidance;  Surgeon: Josue Hector, MD;  Location: [place] CV LAB;  Service: Cardiovascular;  Laterality: N/A;  . Esophagogastroduodenoscopy N/A 06/18/2014    Procedure: ESOPHAGOGASTRODUODENOSCOPY (EGD);  Surgeon: Jerene Bears, MD;  Location: [facility] ENDOSCOPY;  Service: Endoscopy;  Laterality: N/A;  . Peripheral vascular catheterization  06/19/2014    Procedure: Aortic Arch Angiography;  Surgeon: Jettie Booze, MD;  Location: [place] CV LAB;  Service: Cardiovascular;;    Family History  Problem Relation Age of Onset  . Hypertension Father 23  . Diabetes Mother 72  . Colon cancer Neg Hx     Social History:  reports that he has quit smoking. He does not have any smokeless tobacco history on file. He reports that he drinks alcohol. He reports that he does not use illicit drugs.  Allergies: No Known Allergies  Medications:  Scheduled Meds: . aminocaproic acid (AMICAR) for OHS   Intravenous To OR  . [MAR Hold] atorvastatin  40 mg Oral q1800  . bisacodyl  5 mg Oral Once  . cefUROXime (ZINACEF)  IV  1.5 g Intravenous To OR  . cefUROXime (ZINACEF)  IV  750 mg Intravenous To OR  . dexmedetomidine  0.1-0.7 mcg/kg/hr Intravenous To OR  . DOPamine  0-10 mcg/kg/min Intravenous To OR  . epinephrine  0-10 mcg/min Intravenous To OR  . heparin-papaverine-plasmalyte irrigation   Irrigation To OR  . heparin 30,000 units/NS 1000 mL solution for CELLSAVER   Other To OR  . insulin (NOVOLIN-R) infusion   Intravenous To OR  . magnesium sulfate  40 mEq Other To OR  . nitroGLYCERIN  2-200 mcg/min Intravenous To OR  . [MAR Hold]  pantoprazole (PROTONIX) IV  40 mg Intravenous BID  . phenylephrine (NEO-SYNEPHRINE) Adult infusion  30-200 mcg/min Intravenous To OR  . potassium chloride  80 mEq Other To OR  . [MAR Hold] sodium chloride  3 mL Intravenous Q12H  . vancomycin  1,500 mg Intravenous To OR   Continuous Infusions: . [MAR Hold] norepinephrine (LEVOPHED) Adult infusion Stopped (06/19/14 0544)   PRN Meds:.[MAR Hold] sodium chloride, [MAR Hold] sodium chloride, [MAR Hold] acetaminophen, [MAR Hold] ondansetron (ZOFRAN) IV, [MAR Hold] sodium chloride   Results for orders placed or performed during the hospital encounter of 06/14/14 (from the past 48 hour(s))  POCT Activated clotting time     Status: None   Collection Time: 06/19/14  9:35 AM  Result Value Ref Range   Activated Clotting Time 146 seconds  Type and screen     Status: None (Preliminary result)   Collection Time: 06/19/14 10:34 AM  Result Value Ref Range   ABO/RH(D) A POS    Antibody Screen NEG    Sample Expiration 06/22/2014    Unit Number [id number]    Blood Component Type RED CELLS,LR    Unit division 00    Status of Unit ISSUED    Transfusion Status OK TO TRANSFUSE    Crossmatch Result Compatible    Unit Number [id number]    Blood Component Type RED CELLS,LR    Unit division 00    Status of Unit ISSUED    Transfusion Status OK TO TRANSFUSE    Crossmatch Result Compatible    Unit Number [id number]    Blood Component Type RED CELLS,LR    Unit division 00    Status of Unit ISSUED    Transfusion Status OK TO TRANSFUSE    Crossmatch Result Compatible    Unit Number [id number]    Blood Component Type RED CELLS,LR    Unit division 00    Status of Unit ALLOCATED    Transfusion Status OK TO TRANSFUSE    Crossmatch Result Compatible    Unit Number [id number]    Blood Component Type RED CELLS,LR    Unit division 00    Status of Unit ALLOCATED    Transfusion Status OK TO TRANSFUSE    Crossmatch Result Compatible    Unit  Number [id number]    Blood Component Type RED CELLS,LR    Unit division 00    Status of Unit ALLOCATED    Transfusion Status OK TO TRANSFUSE    Crossmatch Result Compatible    Unit Number [id number]    Blood Component Type RED CELLS,LR    Unit division 00    Status of Unit ALLOCATED    Transfusion Status OK TO TRANSFUSE    Crossmatch Result Compatible    Unit Number [id number]    Blood Component Type RED CELLS,LR    Unit division 00  Status of Unit ALLOCATED    Transfusion Status OK TO TRANSFUSE    Crossmatch Result Compatible   Prepare RBC     Status: None   Collection Time: 06/19/14 10:34 AM  Result Value Ref Range   Order Confirmation ORDER PROCESSED BY BLOOD BANK   CBC     Status: Abnormal   Collection Time: 06/19/14 11:00 AM  Result Value Ref Range   WBC 8.4 4.0 - 10.5 K/uL   RBC 2.73 (L) 4.22 - 5.81 MIL/uL   Hemoglobin 8.1 (L) 13.0 - 17.0 g/dL   HCT 24.5 (L) 39.0 - 52.0 %   MCV 89.7 78.0 - 100.0 fL   MCH 29.7 26.0 - 34.0 pg   MCHC 33.1 30.0 - 36.0 g/dL   RDW 17.4 (H) 11.5 - 15.5 %   Platelets 142 (L) 150 - 400 K/uL  Basic metabolic panel     Status: Abnormal   Collection Time: 06/20/14  4:55 AM  Result Value Ref Range   Sodium 130 (L) 135 - 145 mmol/L   Potassium 3.9 3.5 - 5.1 mmol/L   Chloride 97 (L) 101 - 111 mmol/L   CO2 24 22 - 32 mmol/L   Glucose, Bld 103 (H) 65 - 99 mg/dL   BUN 13 6 - 20 mg/dL   Creatinine, Ser 0.94 0.61 - 1.24 mg/dL   Calcium 8.7 (L) 8.9 - 10.3 mg/dL   GFR calc non Af Amer >60 >60 mL/min   GFR calc Af Amer >60 >60 mL/min    Comment: (NOTE) The eGFR has been calculated using the CKD EPI equation. This calculation has not been validated in all clinical situations. eGFR's persistently <60 mL/min signify possible Chronic Kidney Disease.    Anion gap 9 5 - 15  CBC     Status: Abnormal   Collection Time: 06/20/14  4:55 AM  Result Value Ref Range   WBC 7.8 4.0 - 10.5 K/uL   RBC 2.69 (L) 4.22 - 5.81 MIL/uL   Hemoglobin 8.1  (L) 13.0 - 17.0 g/dL   HCT 24.3 (L) 39.0 - 52.0 %   MCV 90.3 78.0 - 100.0 fL   MCH 30.1 26.0 - 34.0 pg   MCHC 33.3 30.0 - 36.0 g/dL   RDW 17.4 (H) 11.5 - 15.5 %   Platelets 159 150 - 400 K/uL  Carboxyhemoglobin     Status: Abnormal   Collection Time: 06/20/14  5:00 AM  Result Value Ref Range   Total hemoglobin 8.4 (L) 13.5 - 18.0 g/dL   O2 Saturation 59.3 %   Carboxyhemoglobin 1.8 (H) 0.5 - 1.5 %   Methemoglobin 1.1 0.0 - 1.5 %  Prepare RBC     Status: None   Collection Time: 06/20/14  7:52 AM  Result Value Ref Range   Order Confirmation      ORDER PROCESSED BY BLOOD BANK BB SAMPLE OR UNITS ALREADY AVAILABLE  Glucose, capillary     Status: Abnormal   Collection Time: 06/20/14 12:03 PM  Result Value Ref Range   Glucose-Capillary 117 (H) 65 - 99 mg/dL  Glucose, capillary     Status: Abnormal   Collection Time: 06/20/14  4:44 PM  Result Value Ref Range   Glucose-Capillary 114 (H) 65 - 99 mg/dL  Prepare RBC (crossmatch)     Status: None   Collection Time: 06/20/14  4:49 PM  Result Value Ref Range   Order Confirmation ORDER PROCESSED BY BLOOD BANK   Surgical pcr screen     Status: None  Collection Time: 06/20/14  9:33 PM  Result Value Ref Range   MRSA, PCR NEGATIVE NEGATIVE   Staphylococcus aureus NEGATIVE NEGATIVE    Comment:        The Xpert SA Assay (FDA approved for NASAL specimens in patients over 90 years of age), is one component of a comprehensive surveillance program.  Test performance has been validated by [facility] for patients greater than or equal to 5 year old. It is not intended to diagnose infection nor to guide or monitor treatment.   Basic metabolic panel     Status: Abnormal   Collection Time: 06/21/14  2:36 AM  Result Value Ref Range   Sodium 130 (L) 135 - 145 mmol/L   Potassium 3.8 3.5 - 5.1 mmol/L   Chloride 99 (L) 101 - 111 mmol/L   CO2 22 22 - 32 mmol/L   Glucose, Bld 133 (H) 65 - 99 mg/dL   BUN 24 (H) 6 - 20 mg/dL   Creatinine, Ser  1.04 0.61 - 1.24 mg/dL   Calcium 7.9 (L) 8.9 - 10.3 mg/dL   GFR calc non Af Amer >60 >60 mL/min   GFR calc Af Amer >60 >60 mL/min    Comment: (NOTE) The eGFR has been calculated using the CKD EPI equation. This calculation has not been validated in all clinical situations. eGFR's persistently <60 mL/min signify possible Chronic Kidney Disease.    Anion gap 9 5 - 15  CBC     Status: Abnormal   Collection Time: 06/21/14  2:36 AM  Result Value Ref Range   WBC 8.7 4.0 - 10.5 K/uL   RBC 2.34 (L) 4.22 - 5.81 MIL/uL   Hemoglobin 7.2 (L) 13.0 - 17.0 g/dL   HCT 21.2 (L) 39.0 - 52.0 %   MCV 90.6 78.0 - 100.0 fL   MCH 30.8 26.0 - 34.0 pg   MCHC 34.0 30.0 - 36.0 g/dL   RDW 17.4 (H) 11.5 - 15.5 %   Platelets 160 150 - 400 K/uL  Protime-INR     Status: Abnormal   Collection Time: 06/21/14  2:36 AM  Result Value Ref Range   Prothrombin Time 17.0 (H) 11.6 - 15.2 seconds   INR 1.37 0.00 - 1.49  APTT     Status: None   Collection Time: 06/21/14  2:36 AM  Result Value Ref Range   aPTT 31 24 - 37 seconds  Prepare RBC     Status: None   Collection Time: 06/21/14  4:12 AM  Result Value Ref Range   Order Confirmation BB SAMPLE OR UNITS ALREADY AVAILABLE   Prepare RBC     Status: None   Collection Time: 06/21/14  6:47 AM  Result Value Ref Range   Order Confirmation ORDER PROCESSED BY BLOOD BANK     Dg Orthopantogram  06/19/2014   CLINICAL DATA:  Aortic stenosis, preop  EXAM: ORTHOPANTOGRAM/PANORAMIC  COMPARISON:  None available  FINDINGS: Multiple missing teeth and restorations. Caries involving teeth 3 and 4. Periapical lucency/erosion involving tooth number 13.  IMPRESSION: 1. Dental caries and periapical disease as above.   Electronically Signed   By: Lucrezia Europe [name]   On: 06/19/2014 16:52   Ct Head Wo Contrast  06/19/2014   CLINICAL DATA:  Patient with cognitive deficits and expressive aphasia.  EXAM: CT HEAD WITHOUT CONTRAST  TECHNIQUE: Contiguous axial images were obtained from the base  of the skull through the vertex without intravenous contrast.  COMPARISON:  None.  FINDINGS: Ventricles and sulci  are appropriate for patient's age. No evidence for acute cortically based infarct, intracranial hemorrhage, mass lesion or mass-effect. Orbits are unremarkable. Polypoid mucosal thickening bilateral maxillary sinuses. Frontal sinus and ethmoid air cells are unremarkable. Mastoid air cells are well aerated. Calvarium is intact. Zygomatic arches are unremarkable.  IMPRESSION: No acute intracranial process.  Polypoid mucosal thickening paranasal sinuses.   Electronically Signed   By: Lovey Newcomer [name]   On: 06/19/2014 15:30    ROS Blood pressure 89/68, pulse 82, temperature 97.9 F (36.6 C), temperature source Oral, resp. rate 12, height 5' 8"  (1.727 m), weight 94.2 kg (207 lb 10.8 oz), SpO2 100 %. Physical Exam  Constitutional: He is oriented to person, place, and time. He appears well-developed and well-nourished. No distress.  Cardiovascular: Normal rate and intact distal pulses.  Exam reveals no friction rub.   3/6 SEM  Respiratory: Effort normal and breath sounds normal. No respiratory distress. He has no wheezes. He exhibits no tenderness.  GI: Soft. Bowel sounds are normal. He exhibits no distension and no mass. There is no tenderness. There is no rebound and no guarding.  Musculoskeletal: Normal range of motion. He exhibits no edema or tenderness.  Neurological: He is alert and oriented to person, place, and time.  Skin: Skin is warm and dry. He is not diaphoretic.  Psychiatric: He has a normal mood and affect. His behavior is normal. Judgment and thought content normal.    Assessment/Plan: UGI bleeding ABL anemia Gastric erosions s/p EGD 5/23 Thrombosed mechanical valve  No role for surgery at this point.  Last melanotic stool was yesterday evening.  He may continue to have these as he evacuates the old blood.  Continue to monitor CBC and hemodynamics.  Hold heparin.  Should  he rebleed, we recommend repeat endoscopy.  Surgery will follow for now. Thank you for the consult.   Cebastian Neis ANP-BC 06/21/2014, 8:58 AM

## 2014-06-21 NOTE — Progress Notes (Addendum)
Patient Name: [name] Date of Encounter: 06/21/2014   SUBJECTIVE  69 y/o male admitted with cardiogenic shock due to partially thrombosed mechanical AoV after stopping coumadin for unclear reasons several months ago. Had duodenal ulcer in 2014 clipped at [facility] Developed GI bleed on integrelin, ASA and heparin which has now been discontinued. Patient is s/p cath showing no CAD; patient was scheduled for AVR today but cancelled due to recurrent melena. Patient presently denies dyspnea or CP.  CURRENT MEDS . aminocaproic acid (AMICAR) for OHS   Intravenous To OR  . [MAR Hold] aspirin  81 mg Oral Daily  . [MAR Hold] atorvastatin  40 mg Oral q1800  . bisacodyl  5 mg Oral Once  . cefUROXime (ZINACEF)  IV  1.5 g Intravenous To OR  . cefUROXime (ZINACEF)  IV  750 mg Intravenous To OR  . dexmedetomidine  0.1-0.7 mcg/kg/hr Intravenous To OR  . DOPamine  0-10 mcg/kg/min Intravenous To OR  . epinephrine  0-10 mcg/min Intravenous To OR  . heparin-papaverine-plasmalyte irrigation   Irrigation To OR  . heparin 30,000 units/NS 1000 mL solution for CELLSAVER   Other To OR  . insulin (NOVOLIN-R) infusion   Intravenous To OR  . magnesium sulfate  40 mEq Other To OR  . metoprolol tartrate  12.5 mg Oral Once  . nitroGLYCERIN  2-200 mcg/min Intravenous To OR  . [MAR Hold] pantoprazole (PROTONIX) IV  40 mg Intravenous BID  . phenylephrine (NEO-SYNEPHRINE) Adult infusion  30-200 mcg/min Intravenous To OR  . potassium chloride  80 mEq Other To OR  . [MAR Hold] sodium chloride  3 mL Intravenous Q12H  . vancomycin  1,500 mg Intravenous To OR   . [MAR Hold] norepinephrine (LEVOPHED) Adult infusion Stopped (06/19/14 0544)   OBJECTIVE  Filed Vitals:   06/21/14 0600 06/21/14 0615 06/21/14 0625 06/21/14 0630  BP: 92/64 90/62  108/59  Pulse: 91 91 86 90  Temp: 98 F (36.7 C)  98 F (36.7 C)   TempSrc: Oral  Oral   Resp: 22 16 15 17   Height:      Weight:      SpO2: 100% 100% 100% 100%     Intake/Output Summary (Last 24 hours) at 06/21/14 0749 Last data filed at 06/21/14 0625  Gross per 24 hour  Intake   1480 ml  Output   1100 ml  Net    380 ml   Filed Weights   06/19/14 0436 06/20/14 0500 06/21/14 0343  Weight: 211 lb 3.2 oz (95.8 kg) 209 lb 14.1 oz (95.2 kg) 207 lb 10.8 oz (94.2 kg)    PHYSICAL EXAM  General: NAD, lying in bed. Neuro: Alert and oriented X 3. Moves all extremities spontaneously. HEENT:  Normal Neck: Supple Lungs:  CTA Heart: Irregular  3/6 systolic murmur all 4 areas on auscultation but heard best aortic area, 2/6 diastolic murmur Abdomen: Soft, full, non-tender, Extremities: no edema  CBC  Recent Labs  06/20/14 0455 06/21/14 0236  WBC 7.8 8.7  HGB 8.1* 7.2*  HCT 24.3* 21.2*  MCV 90.3 90.6  PLT 159 423   Basic Metabolic Panel  Recent Labs  06/20/14 0455 06/21/14 0236  NA 130* 130*  K 3.9 3.8  CL 97* 99*  CO2 24 22  GLUCOSE 103* 133*  BUN 13 24*  CREATININE 0.94 1.04  CALCIUM 8.7* 7.9*  Radiology/Studies  Dg Orthopantogram  06/19/2014   CLINICAL DATA:  Aortic stenosis, preop  EXAM: ORTHOPANTOGRAM/PANORAMIC  COMPARISON:  None available  FINDINGS: Multiple missing teeth and restorations. Caries involving teeth 3 and 4. Periapical lucency/erosion involving tooth number 13.  IMPRESSION: 1. Dental caries and periapical disease as above.   Electronically Signed   By: Lucrezia Europe [name]   On: 06/19/2014 16:52   Ct Head Wo Contrast  06/19/2014   CLINICAL DATA:  Patient with cognitive deficits and expressive aphasia.  EXAM: CT HEAD WITHOUT CONTRAST  TECHNIQUE: Contiguous axial images were obtained from the base of the skull through the vertex without intravenous contrast.  COMPARISON:  None.  FINDINGS: Ventricles and sulci are appropriate for patient's age. No evidence for acute cortically based infarct, intracranial hemorrhage, mass lesion or mass-effect. Orbits are unremarkable. Polypoid mucosal thickening bilateral maxillary sinuses.  Frontal sinus and ethmoid air cells are unremarkable. Mastoid air cells are well aerated. Calvarium is intact. Zygomatic arches are unremarkable.  IMPRESSION: No acute intracranial process.  Polypoid mucosal thickening paranasal sinuses.   Electronically Signed   By: Lovey Newcomer [name]   On: 06/19/2014 15:30   Dg Chest Port 1 View  06/16/2014   CLINICAL DATA:  Central line placement.  EXAM: PORTABLE CHEST - 1 VIEW  COMPARISON:  06/14/2014  FINDINGS: New right jugular central line is been placed with the tip in the upper right atrium. No pneumothorax. Stable volume loss of the right lung with slightly more prominent mid lung atelectasis. Mild interstitial edema suspected. No pleural effusions. Stable cardiac enlargement.  IMPRESSION: Central line tip in upper right atrium. No pneumothorax. Slightly more prominent right mid lung atelectasis. Mild interstitial edema suspected.   Electronically Signed   By: Aletta Edouard [name]   On: 06/16/2014 13:58   Dg Chest Port 1 View  06/14/2014   CLINICAL DATA:  CHF exacerbation, history hypertension and prior AVR  EXAM: PORTABLE CHEST - 1 VIEW  COMPARISON:  Portable exam 2010 hours compared to 06/13/2014  FINDINGS: Enlargement of cardiac silhouette post median sternotomy.  Tortuous aorta.  Mediastinal contours and pulmonary vascularity normal.  Minimal bibasilar atelectasis.  No gross infiltrate, pleural effusion or pneumothorax.  IMPRESSION: Enlargement of cardiac silhouette with minimal bibasilar atelectasis.   Electronically Signed   By: Lavonia Dana [name]   On: 06/14/2014 20:50   TELE: a-fib, rates- 80s  Echo- TEE- 5/20- EF- 24-09%, Systolic function was normal. The estimated ejection fraction was in the range of 55% to 60%. Wall motion was normal; there were no regional wall motion abnormalities. - Aortic valve: St. Jude Mechanical AVR is present. Only one of the leaflets can be visualized as moving and that movement is significantly reduced. The other  leaflet is immobile. There is severe aortic insufficiency that is best appreciated in the 120 degree view. 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  ASSESSMENT AND PLAN  69 year old male with h/o aortic root and aortic valve replacement at [facility] in 1993 with 25 mm St Jude valve conduit, who presented to [facility] 5/18 with acute CHF presumably sec to valve dysfunction.   1. 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 Cardiogenic Shock with diastolic CHF exacerbation- likely due to valve thrombosis, resolved 3. Acute GI bleed with melena with gastric cardia erosions noted on EGD 4. Chronic Atrial Fib 5. Thrombocytopenia 6. 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 The coronary reconstruction was with a 8 mm Gore-Tex graft Frederich Cha  type repair in 1993  - Cath results- No Obstructive disease, 10% stenosis- LAD and Circumflex.  Patient had been scheduled for high risk redo aortic valve replacement today. However he again developed melena yesterday and hemoglobin has decreased to 7.2. Surgery has been delayed. Continue Protonix. Transfuse 2 units packed red blood cells. Hold aspirin and DVT prophylaxis Lovenox. Will ask Dr Hilarie Fredrickson to reassess for consideration of clipping previous gastric cardia erosions. If surgical intervention required for gastric erosions, he would be high risk due to prosthetic valve dysfunction.  [name]

## 2014-06-21 NOTE — Progress Notes (Signed)
bp in the 90s,blood infusing w/ no reaction;family at bedside-pt wedding ring given to the [place].

## 2014-06-21 NOTE — Anesthesia Procedure Notes (Signed)
Procedure Name: Intubation Date/Time: 06/21/2014 2:18 PM Performed by: Sampson Si E Pre-anesthesia Checklist: Patient identified, Emergency Drugs available, Suction available, Patient being monitored and Timeout performed Patient Re-evaluated:Patient Re-evaluated prior to inductionOxygen Delivery Method: Circle system utilized Preoxygenation: Pre-oxygenation with 100% oxygen Intubation Type: IV induction, Rapid sequence and Cricoid Pressure applied Laryngoscope Size: Mac and 3 Grade View: Grade I Tube type: Subglottic suction tube Tube size: 7.5 mm Number of attempts: 1 Airway Equipment and Method: Stylet Placement Confirmation: ETT inserted through vocal cords under direct vision,  positive ETCO2 and breath sounds checked- equal and bilateral Secured at: 23 cm Tube secured with: Tape Dental Injury: Teeth and Oropharynx as per pre-operative assessment

## 2014-06-22 ENCOUNTER — Encounter (HOSPITAL_COMMUNITY): Payer: Self-pay | Admitting: Internal Medicine

## 2014-06-22 DIAGNOSIS — K254 Chronic or unspecified gastric ulcer with hemorrhage: Secondary | ICD-10-CM | POA: Insufficient documentation

## 2014-06-22 DIAGNOSIS — I480 Paroxysmal atrial fibrillation: Secondary | ICD-10-CM

## 2014-06-22 LAB — BASIC METABOLIC PANEL
Anion gap: 5 (ref 5–15)
BUN: 31 mg/dL — ABNORMAL HIGH (ref 6–20)
CO2: 23 mmol/L (ref 22–32)
Calcium: 7.8 mg/dL — ABNORMAL LOW (ref 8.9–10.3)
Chloride: 102 mmol/L (ref 101–111)
Creatinine, Ser: 1.14 mg/dL (ref 0.61–1.24)
GFR calc Af Amer: >60 mL/min (ref >60)
Glucose, Bld: 123 mg/dL — ABNORMAL HIGH (ref 65–99)
Potassium: 3.9 mmol/L (ref 3.5–5.1)
Sodium: 130 mmol/L — ABNORMAL LOW (ref 135–145)

## 2014-06-22 LAB — CBC
HCT: 25.7 % — ABNORMAL LOW (ref 39.0–52.0)
HCT: 24.3 % — ABNORMAL LOW (ref 39.0–52.0)
HEMOGLOBIN: 8.8 g/dL — AB (ref 13.0–17.0)
HEMOGLOBIN: 8.2 g/dL — AB (ref 13.0–17.0)
MCH: 29.5 pg (ref 26.0–34.0)
MCH: 29.1 pg (ref 26.0–34.0)
MCHC: 34.2 g/dL (ref 30.0–36.0)
MCHC: 33.7 g/dL (ref 30.0–36.0)
MCV: 86.2 fL (ref 78.0–100.0)
MCV: 86.2 fL (ref 78.0–100.0)
Platelets: 161 10*3/uL (ref 150–400)
Platelets: 168 10*3/uL (ref 150–400)
RBC: 2.98 MIL/uL — ABNORMAL LOW (ref 4.22–5.81)
RBC: 2.82 MIL/uL — AB (ref 4.22–5.81)
RDW: 18.7 % — ABNORMAL HIGH (ref 11.5–15.5)
RDW: 18.5 % — AB (ref 11.5–15.5)
WBC: 11.9 10*3/uL — AB (ref 4.0–10.5)
WBC: 12.5 10*3/uL — AB (ref 4.0–10.5)

## 2014-06-22 LAB — GLUCOSE, CAPILLARY
GLUCOSE-CAPILLARY: 126 mg/dL — AB (ref 65–99)
Glucose-Capillary: 129 mg/dL — ABNORMAL HIGH (ref 65–99)

## 2014-06-22 MED ORDER — SODIUM CHLORIDE 0.9 % IJ SOLN
3.0000 mL | Freq: Two times a day (BID) | INTRAMUSCULAR | Status: DC
  Administered 2014-06-23 – 2014-06-24 (×2): 3 mL via INTRAVENOUS
  Administered 2014-06-24 – 2014-06-25 (×2): 10 mL via INTRAVENOUS
  Administered 2014-06-26 – 2014-06-27 (×3): 3 mL via INTRAVENOUS

## 2014-06-22 MED ORDER — CETYLPYRIDINIUM CHLORIDE 0.05 % MT LIQD
7.0000 mL | Freq: Two times a day (BID) | OROMUCOSAL | Status: DC
  Administered 2014-06-22 – 2014-06-29 (×12): 7 mL via OROMUCOSAL

## 2014-06-22 MED ORDER — FENTANYL CITRATE (PF) 100 MCG/2ML IJ SOLN
12.5000 ug | INTRAMUSCULAR | Status: DC | PRN
  Administered 2014-06-28: 12.5 ug via INTRAVENOUS
  Filled 2014-06-22: qty 2

## 2014-06-22 NOTE — Progress Notes (Signed)
PT Cancellation Note  Patient Details Name: [name] MRN: [id number] DOB: September 03, 1945   Cancelled Treatment:    Reason Eval/Treat Not Completed: Medical issues which prohibited therapy (pt remains intubated and await extubation hopefully today will attempt next date)   [name] 06/22/2014, 10:49 AM [name], [place]

## 2014-06-22 NOTE — Anesthesia Postprocedure Evaluation (Signed)
  Anesthesia Post-op Note  Patient: [name]  Procedure(s) Performed: Procedure(s): ESOPHAGOGASTRODUODENOSCOPY (EGD) (N/A)  Patient Location: ICU  Anesthesia Type:General  Level of Consciousness: sedated  Airway and Oxygen Therapy: Patient remains intubated per anesthesia plan  Post-op Pain: none  Post-op Assessment: Post-op Vital signs reviewed, Patient's Cardiovascular Status Stable and Respiratory Function Stable  Post-op Vital Signs: Reviewed and stable  Last Vitals:  Filed Vitals:   06/22/14 0600  BP:   Pulse: 48  Temp:   Resp: 16    Complications: No apparent anesthesia complications

## 2014-06-22 NOTE — Progress Notes (Signed)
[facility] Progress Note 1 Day Post-Op  Subjective: Hemoglobin has gone up.  Patient still intubated from having UGI yesterday.  To be extubated today.  Objective: Vital signs in last 24 hours: Temp:  [97.5 F (36.4 C)-98.6 F (37 C)] 97.5 F (36.4 C) (05/27 0400) Pulse Rate:  [35-124] 70 (05/27 0800) Resp:  [0-25] 23 (05/27 0800) BP: (52-144)/(37-90) 89/38 mmHg (05/27 0800) SpO2:  [100 %] 100 % (05/27 0800) Arterial Line BP: (65-162)/(41-98) 93/54 mmHg (05/27 0800) FiO2 (%):  [40 %-100 %] 40 % (05/27 0326) Weight:  [97.2 kg (214 lb 4.6 oz)] 97.2 kg (214 lb 4.6 oz) (05/27 0500) Last BM Date: 06/21/14  Intake/Output from previous day: 05/26 0701 - 05/27 0700 In: 2600.2 [I.V.:1817.7; Blood:782.5] Out: 2100 [Urine:1100] Intake/Output this shift: Total I/O In: 157.3 [I.V.:157.3] Out: -   General: No distress.  Lungs: Clear.  To be weaned and extubated  Abd: Soft, non-tender, good bowel sounds.  Ulcer clipped yesterday  Extremities: No changes  Neuro: Intact  Lab Results:  @LABLAST2 (wbc:2,hgb:2,hct:2,plt:2) BMET  Recent Labs  06/21/14 0236 06/22/14 0500  NA 130* 130*  K 3.8 3.9  CL 99* 102  CO2 22 23  GLUCOSE 133* 123*  BUN 24* 31*  CREATININE 1.04 1.14  CALCIUM 7.9* 7.8*   PT/INR  Recent Labs  06/21/14 0236  LABPROT 17.0*  INR 1.37   ABG No results for input(s): PHART, HCO3 in the last 72 hours.  Invalid input(s): PCO2, PO2  Studies/Results: Dg Chest Portable 1 View  06/21/2014   CLINICAL DATA:  Status post EGD for upper GI bleed, evaluate ETT  EXAM: PORTABLE CHEST - 1 VIEW  COMPARISON:  06/16/2014  FINDINGS: Endotracheal tube terminates 4 cm above the carina.  Mild blunting of the left costophrenic angle. Mild left basilar atelectasis. Lungs otherwise clear. No pleural effusion or pneumothorax.  Cardiomegaly.  Right IJ venous catheter terminates at the cavoatrial junction.  IMPRESSION: Endotracheal tube terminates 4 cm above the carina.  Right IJ  venous catheter terminates at the cavoatrial junction.  No pneumothorax.   Electronically Signed   By: Julian Hy [name]   On: 06/21/2014 16:25    Anti-infectives: Anti-infectives    Start     Dose/Rate Route Frequency Ordered Stop   06/21/14 0400  vancomycin (VANCOCIN) 1,500 mg in sodium chloride 0.9 % 250 mL IVPB     1,500 mg 125 mL/hr over 120 Minutes Intravenous To Surgery 06/20/14 1422 06/22/14 0400   06/21/14 0400  cefUROXime (ZINACEF) 1.5 g in dextrose 5 % 50 mL IVPB     1.5 g 100 mL/hr over 30 Minutes Intravenous To Surgery 06/20/14 1422 06/22/14 0400   06/21/14 0400  cefUROXime (ZINACEF) 750 mg in dextrose 5 % 50 mL IVPB     750 mg 100 mL/hr over 30 Minutes Intravenous To Surgery 06/20/14 1422 06/22/14 0400   06/15/14 1115  ampicillin (OMNIPEN) 2 g in sodium chloride 0.9 % 50 mL IVPB     2 g 150 mL/hr over 20 Minutes Intravenous To Endoscopy 06/15/14 1102 06/15/14 1218      Assessment/Plan: s/p Procedure(s): ESOPHAGOGASTRODUODENOSCOPY (EGD) Bleeding from ulcer currently resoled with clips in place.  LOS: 8 days   Kathryne Eriksson. Dahlia Bailiff, MD, FACS [phone] [phone] [facility] Surgery 06/22/2014

## 2014-06-22 NOTE — Progress Notes (Addendum)
Patient Name: [name] Date of Encounter: 06/22/2014   SUBJECTIVE  69 y/o male admitted with cardiogenic shock due to partially thrombosed mechanical AoV after stopping coumadin for unclear reasons several months ago. Had duodenal ulcer in 2014 clipped at [facility] Developed GI bleed on integrelin, ASA and heparin which has now been discontinued. Patient is s/p cath showing no CAD; patient was scheduled for AVR today but cancelled due to recurrent melena.   Pt had EGD twice  06/21/2014- First EGD aborted due to concern for aspiration, showed large blood clot, prior erosions with ozing blood vessel, 4 gastric clips were used and epinephrine injected.  This morning patient is drowsy but stable. Denies pain and moves all extremities. No dark stool since EGD.   CURRENT MEDS . antiseptic oral rinse  15 mL Mouth Rinse 6 times per day  . [MAR Hold] atorvastatin  40 mg Oral q1800  . bisacodyl  5 mg Oral Once  . chlorhexidine  15 mL Mouth/Throat BID  . [MAR Hold] sodium chloride  3 mL Intravenous Q12H   . sodium chloride 20 mL/hr at 06/21/14 1015  . dexmedetomidine Stopped (06/22/14 0725)  . [MAR Hold] norepinephrine (LEVOPHED) Adult infusion Stopped (06/19/14 0544)  . pantoprozole (PROTONIX) infusion 8 mg/hr (06/22/14 0336)  . phenylephrine (NEO-SYNEPHRINE) Adult infusion 80 mcg/min (06/22/14 0725)   OBJECTIVE  Filed Vitals:   06/22/14 0400 06/22/14 0430 06/22/14 0500 06/22/14 0600  BP: 127/74  95/64   Pulse: 69 74 74 48  Temp: 97.5 F (36.4 C)     TempSrc: Oral     Resp: 16 16 19 16   Height:      Weight:   214 lb 4.6 oz (97.2 kg)   SpO2: 100% 100% 100% 100%    Intake/Output Summary (Last 24 hours) at 06/22/14 0729 Last data filed at 06/22/14 0600  Gross per 24 hour  Intake 2600.23 ml  Output   2100 ml  Net 500.23 ml   Filed Weights   06/20/14 0500 06/21/14 0343 06/22/14 0500  Weight: 209 lb 14.1 oz (95.2 kg) 207 lb 10.8 oz (94.2 kg) 214 lb 4.6 oz (97.2 kg)     PHYSICAL EXAM  General: NAD, lying in bed, on vent and on sedatives Neuro: Alert and oriented X 3. Moves all extremities spontaneously. HEENT:  Normal, AT, [place] Lungs:  CTA Heart: Irregular  3/6 systolic murmur all 4 areas on auscultation but heard best aortic area, 2/6 diastolic murmur Abdomen: Soft, full, non-tender, Extremities: no edema, warm and well perfused.  CBC  Recent Labs  06/21/14 1600 06/22/14 0500  WBC 10.9* 11.9*  HGB 8.1* 8.8*  HCT 23.6* 25.7*  MCV 85.2 86.2  PLT 137* 127   Basic Metabolic Panel  Recent Labs  06/21/14 0236 06/22/14 0500  NA 130* 130*  K 3.8 3.9  CL 99* 102  CO2 22 23  GLUCOSE 133* 123*  BUN 24* 31*  CREATININE 1.04 1.14  CALCIUM 7.9* 7.8*  Radiology/Studies  Dg Orthopantogram  06/19/2014   CLINICAL DATA:  Aortic stenosis, preop  EXAM: ORTHOPANTOGRAM/PANORAMIC  COMPARISON:  None available  FINDINGS: Multiple missing teeth and restorations. Caries involving teeth 3 and 4. Periapical lucency/erosion involving tooth number 13.  IMPRESSION: 1. Dental caries and periapical disease as above.   Electronically Signed   By: Lucrezia Europe [name]   On: 06/19/2014 16:52   Ct Head Wo Contrast  06/19/2014   CLINICAL DATA:  Patient with cognitive deficits and expressive aphasia.  EXAM: CT  HEAD WITHOUT CONTRAST  TECHNIQUE: Contiguous axial images were obtained from the base of the skull through the vertex without intravenous contrast.  COMPARISON:  None.  FINDINGS: Ventricles and sulci are appropriate for patient's age. No evidence for acute cortically based infarct, intracranial hemorrhage, mass lesion or mass-effect. Orbits are unremarkable. Polypoid mucosal thickening bilateral maxillary sinuses. Frontal sinus and ethmoid air cells are unremarkable. Mastoid air cells are well aerated. Calvarium is intact. Zygomatic arches are unremarkable.  IMPRESSION: No acute intracranial process.  Polypoid mucosal thickening paranasal sinuses.   Electronically Signed    By: Lovey Newcomer [name]   On: 06/19/2014 15:30   Dg Chest Portable 1 View  06/21/2014   CLINICAL DATA:  Status post EGD for upper GI bleed, evaluate ETT  EXAM: PORTABLE CHEST - 1 VIEW  COMPARISON:  06/16/2014  FINDINGS: Endotracheal tube terminates 4 cm above the carina.  Mild blunting of the left costophrenic angle. Mild left basilar atelectasis. Lungs otherwise clear. No pleural effusion or pneumothorax.  Cardiomegaly.  Right IJ venous catheter terminates at the cavoatrial junction.  IMPRESSION: Endotracheal tube terminates 4 cm above the carina.  Right IJ venous catheter terminates at the cavoatrial junction.  No pneumothorax.   Electronically Signed   By: Julian Hy [name]   On: 06/21/2014 16:25   Dg Chest Port 1 View  06/16/2014   CLINICAL DATA:  Central line placement.  EXAM: PORTABLE CHEST - 1 VIEW  COMPARISON:  06/14/2014  FINDINGS: New right jugular central line is been placed with the tip in the upper right atrium. No pneumothorax. Stable volume loss of the right lung with slightly more prominent mid lung atelectasis. Mild interstitial edema suspected. No pleural effusions. Stable cardiac enlargement.  IMPRESSION: Central line tip in upper right atrium. No pneumothorax. Slightly more prominent right mid lung atelectasis. Mild interstitial edema suspected.   Electronically Signed   By: Aletta Edouard [name]   On: 06/16/2014 13:58   Dg Chest Port 1 View  06/14/2014   CLINICAL DATA:  CHF exacerbation, history hypertension and prior AVR  EXAM: PORTABLE CHEST - 1 VIEW  COMPARISON:  Portable exam 2010 hours compared to 06/13/2014  FINDINGS: Enlargement of cardiac silhouette post median sternotomy.  Tortuous aorta.  Mediastinal contours and pulmonary vascularity normal.  Minimal bibasilar atelectasis.  No gross infiltrate, pleural effusion or pneumothorax.  IMPRESSION: Enlargement of cardiac silhouette with minimal bibasilar atelectasis.   Electronically Signed   By: Lavonia Dana [name]   On: 06/14/2014  20:50   TELE: a-fib, rates- mostly 75s.   Echo- TEE- 5/20- EF- 37-10%, Systolic function was normal. The estimated ejection fraction was in the range of 55% to 60%. Wall motion was normal; there were no regional wall motion abnormalities. - Aortic valve: St. Jude Mechanical AVR is present. Only one of the leaflets can be visualized as moving and that movement is significantly reduced. The other leaflet is immobile. There is severe aortic insufficiency that is best appreciated in the 120 degree view. 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  ASSESSMENT AND PLAN  69 year old male with h/o aortic root and aortic valve replacement at [facility] in 1993 with 25 mm St Jude valve conduit, who presented to [facility] 5/18 with acute CHF presumably sec to valve dysfunction.   1. 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  2. Cardiogenic Shock with diastolic CHF exacerbation- likely due to valve thrombosis, resolved 3. Acute GI bleed with melena with gastric cardia erosions noted on EGD, EGD twice  06/21/2014- First EGD aborted due to concern for aspiration, showed large blood clot, prior erosions with ozing blood vessel, 4 gastric clips were used and epinephrine injected. S/p 6 units of blood since admission. Hgb today- 8.8. No dark stools since EGD. 4. Chronic Atrial Fib- Rate controled, anticoag held. 5. Thrombocytopenia- Stable 6. 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 The coronary reconstruction was with a 8 mm Gore-Tex graft Cabral type repair in 1993  - Cath results- No Obstructive disease, 10% stenosis- LAD and Circumflex.   Ronald Pippins, PGY-2 Resident 7:36 AM 06/22/2014  As above, patient intubated but  opens eyes. Events of yesterday noted (s/p injection and clipping of visible vessel in gastric cardia erosion). Patient received 3 units of packed red blood cells yesterday. Hemoglobin this morning 8.8. Continue Protonix. Follow hemoglobin. Transfuse as needed. GI following. Wean phenylephrine as tolerated. Plan high risk redo aortic valve replacement (for valve thrombosis with associated AI/AS) when bleeding stable (timing per Dr Prescott Gum). Continue to hold all anticoagulation.  [name]

## 2014-06-22 NOTE — Procedures (Signed)
Extubation Procedure Note  Patient Details:   Name: [name] DOB: [birth year] MRN: [id number]   Airway Documentation:  Airway 7.5 mm (Active)  Secured at (cm) 23 cm 06/22/2014  8:39 AM  Measured From Lips 06/22/2014  8:39 AM  Secured Location Right 06/22/2014  8:39 AM  Secured By [employer] 06/22/2014  8:39 AM  Tube Holder Repositioned Yes 06/22/2014  8:39 AM  Cuff Pressure (cm H2O) 30 cm H2O 06/22/2014  3:26 AM  Site Condition Dry 06/22/2014  8:39 AM   Extubated to 3lpm [place], incentive achieved 500cc;s, no distress at this time. Evaluation  O2 sats: stable throughout Complications: No apparent complications Patient did tolerate procedure well. Bilateral Breath Sounds: Diminished Suctioning: Airway Yes  Donella Stade 06/22/2014, 11:26 AM

## 2014-06-22 NOTE — Progress Notes (Signed)
Daily Rounding Note  06/22/2014, 8:00 AM  LOS: 8 days   SUBJECTIVE:       No stools.  Still intubated but plans to wean off vent today.  Surgery remains on hold.  Note weight increase of 5 to 7 # in last 3 days, ? If accurate.   OBJECTIVE:         Vital signs in last 24 hours:    Temp:  [97.5 F (36.4 C)-98.6 F (37 C)] 97.5 F (36.4 C) (05/27 0400) Pulse Rate:  [35-124] 48 (05/27 0600) Resp:  [0-25] 16 (05/27 0600) BP: (52-144)/(37-90) 95/64 mmHg (05/27 0500) SpO2:  [100 %] 100 % (05/27 0600) Arterial Line BP: (65-162)/(41-98) 106/83 mmHg (05/27 0600) FiO2 (%):  [40 %-100 %] 40 % (05/27 0326) Weight:  [214 lb 4.6 oz (97.2 kg)] 214 lb 4.6 oz (97.2 kg) (05/27 0500) Last BM Date: 06/21/14 Filed Weights   06/20/14 0500 06/21/14 0343 06/22/14 0500  Weight: 209 lb 14.1 oz (95.2 kg) 207 lb 10.8 oz (94.2 kg) 214 lb 4.6 oz (97.2 kg)   General: alert,    Heart: RRR Chest: intubated on vent, clear Abdomen: soft, BS scant to absent, NT, ND.    Extremities: no CCE Neuro/Psych:  Follows commands but overall disengaged and diminished level of interaction.   Intake/Output from previous day: 05/26 0701 - 05/27 0700 In: 2600.2 [I.V.:1817.7; Blood:782.5] Out: 2100 [Urine:1100]  Intake/Output this shift:    Lab Results:  Recent Labs  06/21/14 0236 06/21/14 1600 06/22/14 0500  WBC 8.7 10.9* 11.9*  HGB 7.2* 8.1* 8.8*  HCT 21.2* 23.6* 25.7*  PLT 160 137* 161   BMET  Recent Labs  06/20/14 0455 06/21/14 0236 06/22/14 0500  NA 130* 130* 130*  K 3.9 3.8 3.9  CL 97* 99* 102  CO2 24 22 23   GLUCOSE 103* 133* 123*  BUN 13 24* 31*  CREATININE 0.94 1.04 1.14  CALCIUM 8.7* 7.9* 7.8*   LFT No results for input(s): PROT, ALBUMIN, AST, ALT, ALKPHOS, BILITOT, BILIDIR, IBILI in the last 72 hours. PT/INR  Recent Labs  06/21/14 0236  LABPROT 17.0*  INR 1.37   Hepatitis Panel No results for input(s): HEPBSAG, HCVAB,  HEPAIGM, HEPBIGM in the last 72 hours.  Studies/Results: Dg Chest Portable 1 View  06/21/2014   CLINICAL DATA:  Status post EGD for upper GI bleed, evaluate ETT  EXAM: PORTABLE CHEST - 1 VIEW  COMPARISON:  06/16/2014  FINDINGS: Endotracheal tube terminates 4 cm above the carina.  Mild blunting of the left costophrenic angle. Mild left basilar atelectasis. Lungs otherwise clear. No pleural effusion or pneumothorax.  Cardiomegaly.  Right IJ venous catheter terminates at the cavoatrial junction.  IMPRESSION: Endotracheal tube terminates 4 cm above the carina.  Right IJ venous catheter terminates at the cavoatrial junction.  No pneumothorax.   Electronically Signed   By: Julian Hy [name]   On: 06/21/2014 16:25   Scheduled Meds: . antiseptic oral rinse  15 mL Mouth Rinse 6 times per day  . [MAR Hold] atorvastatin  40 mg Oral q1800  . bisacodyl  5 mg Oral Once  . chlorhexidine  15 mL Mouth/Throat BID  . [MAR Hold] sodium chloride  3 mL Intravenous Q12H   Continuous Infusions: . sodium chloride 20 mL/hr at 06/21/14 1015  . dexmedetomidine Stopped (06/22/14 0725)  . [MAR Hold] norepinephrine (LEVOPHED) Adult infusion Stopped (06/19/14 0544)  . pantoprozole (PROTONIX) infusion 8 mg/hr (06/22/14 0336)  .  phenylephrine (NEO-SYNEPHRINE) Adult infusion 80 mcg/min (06/22/14 0725)   PRN Meds:.[MAR Hold] sodium chloride, [MAR Hold] sodium chloride, [MAR Hold] acetaminophen, fentaNYL (SUBLIMAZE) injection, fentaNYL (SUBLIMAZE) injection, [MAR Hold] ondansetron (ZOFRAN) IV, [MAR Hold] sodium chloride  ASSESMENT:   *  UGIB (melena). EGD #1 5/23: gastric cardia erosions, mild duodenitis EGD #2 5/26: evacuation of large gastric blood clot.  Cardia erosion with VV treated with epi injection and 4 hemoclips.   PPI drip in place (had been BID IV).  Thinners on hold.  Intubated for procedure.   *  ABL anemia.  Total PRBCs to date: 6, four in the last 36 hours.   * Thrombosed mechanical AoV, severe AI.  Planned 5/26 redo valve/aortic root replacement postponed.   PLAN   *  Leave PPI drip in place.   *  If safe for PO intake post intubation (may need BSS eval), ok to start po's of full liquids and advance to solids as tolerated.  Was on solids until recurrent GIB.     [name]  06/22/2014, 8:00 AM Pager: [phone]

## 2014-06-22 NOTE — Progress Notes (Signed)
PULMONARY / CRITICAL CARE MEDICINE   Name: [name] MRN: [id number] DOB: 06/14/1945    ADMISSION DATE:  06/14/2014 CONSULTATION DATE:  06/21/14   INITIAL PRESENTATION:  69 yo male with hx aortic valve replacement in the 1990's, splenectomy. He was tx to [facility] 5/19 from [place] with cardiogenic shock r/t severe aortic valve insufficiency/ partially thrombosed mechanical AoV after stopping coumadin for unclear reasons several months prior.  He was treated with integrelin, asa and heparin and was scheduled for AVR 5/26 but developed recurrent melena and surgery put on hold.  He was seen in consultation by GI and underwent endoscopy 5/26 with clipping of gastric erosion under general anesthesia and PCCM re-consulted for post-procedure vent management.   STUDIES:  CT head 5/24>>> negative  Dental orthopantogram 5/24>>> Dental caries and periapical disease as above.                                                                                                                                                                                                                                                                                                 SIGNIFICANT EVENTS: 5/26>> EGD with clipping (x4) of gastric erosion Cath 5/20>>> - No Obstructive disease, 10% stenosis- LAD and Circumflex.  SUBJECTIVE:  No bleeding overnight  VITAL SIGNS: Temp:  [97.5 F (36.4 C)-98.6 F (37 C)] 97.5 F (36.4 C) (05/27 0400) Pulse Rate:  [35-124] 85 (05/27 1000) Resp:  [0-23] 19 (05/27 1000) BP: (52-144)/(37-90) 90/56 mmHg (05/27 1000) SpO2:  [100 %] 100 % (05/27 1000) Arterial Line BP: (65-162)/(41-98) 97/57 mmHg (05/27 1000) FiO2 (%):  [40 %-100 %] 40 % (05/27 0839) Weight:  [97.2 kg (214 lb 4.6 oz)] 97.2 kg (214 lb 4.6 oz) (05/27 0500) HEMODYNAMICS: CVP:  [11 mmHg-15 mmHg] 11 mmHg VENTILATOR SETTINGS: Vent Mode:  [-] PRVC FiO2 (%):  [40 %-100 %] 40 % Set Rate:  [16 bmp] 16 bmp Vt Set:   [550 mL] 550 mL PEEP:  [5 cmH20] 5 cmH20 Pressure Support:  [10 cmH20] 10 cmH20 Plateau Pressure:  [18 cmH20-22 cmH20] 18 cmH20 INTAKE / OUTPUT:  Intake/Output Summary (Last 24 hours) at 06/22/14 1110 Last data filed at 06/22/14 0800  Gross per 24 hour  Intake 2727.53 ml  Output   2100 ml  Net 627.53 ml    PHYSICAL EXAMINATION: Gen: awake on vent, comfortable HENT: OP clear, TM's clear, neck supple PULM: CTA B, vent supported breaths CV: RRR, no mgr, trace edema GI: BS+, soft, nontender Derm: no cyanosis or rash Psyche: normal mood and affect   LABS:  CBC  Recent Labs Lab 06/21/14 0236 06/21/14 1600 06/22/14 0500  WBC 8.7 10.9* 11.9*  HGB 7.2* 8.1* 8.8*  HCT 21.2* 23.6* 25.7*  PLT 160 137* 161   Coag's  Recent Labs Lab 06/15/14 1336 06/21/14 0236  APTT  --  31  INR 1.67* 1.37   BMET  Recent Labs Lab 06/20/14 0455 06/21/14 0236 06/22/14 0500  NA 130* 130* 130*  K 3.9 3.8 3.9  CL 97* 99* 102  CO2 24 22 23   BUN 13 24* 31*  CREATININE 0.94 1.04 1.14  GLUCOSE 103* 133* 123*   Electrolytes  Recent Labs Lab 06/20/14 0455 06/21/14 0236 06/22/14 0500  CALCIUM 8.7* 7.9* 7.8*   Sepsis Markers No results for input(s): LATICACIDVEN, PROCALCITON, O2SATVEN in the last 168 hours. ABG  Recent Labs Lab 06/15/14 1421 06/19/14 0808  PHART 7.370 7.440  PCO2ART 40.2 31.6*  PO2ART 123.0* 77.0*   Liver Enzymes  Recent Labs Lab 06/15/14 1336  AST 40  ALT 27  ALKPHOS 51  BILITOT 1.9*  ALBUMIN 3.4*   Cardiac Enzymes No results for input(s): TROPONINI, PROBNP in the last 168 hours. Glucose  Recent Labs Lab 06/20/14 1203 06/20/14 1644 06/21/14 1952 06/22/14 0010 06/22/14 0343  GLUCAP 117* 114* 133* 129* 126*    Imaging 5/26 CXR images reviewed> ETT in place, no infiltrate, LAE noted   ASSESSMENT / PLAN:  PULMONARY OETT 5/26 Acute respiratory failure - post endoscopy procedure  P:   Extubate today Incentive spirometry, oral care  afterwards  CARDIOVASCULAR CVL (DB) R IJ 5/21>>> Prosthetic aortic valve dysfunction/ thrombosis with severe AI  Cardiogenic shock  Acute on chronic dCHF  Afib  Hx type A aortic dissection Hypotension   P:  Cont hold anticoagulation for now  For AVR planning per cardiology and CVTS Low dose neosynephrine to keep MAP >65  RENAL Hyponatremia  P:   F/u chem   GASTROINTESTINAL GI bleeding - gastric/duodenal ulcer s/p EGD with clipping 5/26 P:   Cont protonix gtt  GI/surgery following, nutrition per GI Stat CBC  HEMATOLOGIC Blood loss anemia - r/t GI bleeding  Prosthetic aortic valve thrombus  P:  PRBC as needed for Hgb < 7gm/dL Trend cbc  Holding anticoagulation  SCD's   INFECTIOUS No indication active infection  P:   Monitor for fever  ENDOCRINE No active issue  P:   Monitor glucose on chem   NEUROLOGIC No active issue  P:   D/c sedation orders post extubation   FAMILY  - Updates: son Kendric updated bedside 5/27 AM  - Inter-disciplinary family meet or Palliative Care meeting due by:  6/2  The patient is critically ill with multiple organ systems failure and requires high complexity decision making for assessment and support, frequent evaluation and titration of therapies, application of advanced monitoring technologies and extensive interpretation of multiple databases.   Critical Care Time devoted to patient care services described in this note is 45  Minutes.  Roselie Awkward, MD [place] [facility] Pager: [phone] Cell: [phone] After 3pm or if  no response, call [phone]

## 2014-06-22 NOTE — Progress Notes (Signed)
SBT trial successful. Patient extubated per MD order. Extubated to 3L [place]. Sats 100%. No distress, no stridor. Strong cough and strong voice. Patient able to achieve 500 on IS.   Patient comfortable. No distress.   Will keep NPO for 4 hours post extubation. Bed side swallow exam to follow. OK from GI standpoint for patient to have food today.   Will continue to monitor closely.   Roxan Hockey, RN

## 2014-06-22 NOTE — Progress Notes (Signed)
Patient tolerated soft diet well for dinner. No coughing, no signs of aspiration. Patient ate approx 50% of meal.   No stools today.  Will continue to monitor.

## 2014-06-23 DIAGNOSIS — Z954 Presence of other heart-valve replacement: Secondary | ICD-10-CM

## 2014-06-23 LAB — TYPE AND SCREEN
ABO/RH(D): A POS
Antibody Screen: NEGATIVE
Unit division: 0
Unit division: 0
Unit division: 0
Unit division: 0
Unit division: 0
Unit division: 0
Unit division: 0
Unit division: 0

## 2014-06-23 LAB — CBC WITH DIFFERENTIAL/PLATELET
BASOS ABS: 0.1 10*3/uL (ref 0.0–0.1)
Basophils Relative: 0 % (ref 0–1)
EOS ABS: 0.2 10*3/uL (ref 0.0–0.7)
Eosinophils Relative: 2 % (ref 0–5)
HCT: 22.7 % — ABNORMAL LOW (ref 39.0–52.0)
Hemoglobin: 7.6 g/dL — ABNORMAL LOW (ref 13.0–17.0)
LYMPHS PCT: 9 % — AB (ref 12–46)
Lymphs Abs: 1.1 10*3/uL (ref 0.7–4.0)
MCH: 29.5 pg (ref 26.0–34.0)
MCHC: 33.5 g/dL (ref 30.0–36.0)
MCV: 88 fL (ref 78.0–100.0)
Monocytes Absolute: 1.8 10*3/uL — ABNORMAL HIGH (ref 0.1–1.0)
Monocytes Relative: 14 % — ABNORMAL HIGH (ref 3–12)
Neutro Abs: 9.3 10*3/uL — ABNORMAL HIGH (ref 1.7–7.7)
Neutrophils Relative %: 75 % (ref 43–77)
PLATELETS: 172 10*3/uL (ref 150–400)
RBC: 2.58 MIL/uL — ABNORMAL LOW (ref 4.22–5.81)
RDW: 18.6 % — ABNORMAL HIGH (ref 11.5–15.5)
WBC: 12.3 10*3/uL — AB (ref 4.0–10.5)

## 2014-06-23 LAB — BASIC METABOLIC PANEL
ANION GAP: 3 — AB (ref 5–15)
BUN: 20 mg/dL (ref 6–20)
CHLORIDE: 103 mmol/L (ref 101–111)
CO2: 24 mmol/L (ref 22–32)
Calcium: 7.8 mg/dL — ABNORMAL LOW (ref 8.9–10.3)
Creatinine, Ser: 1.07 mg/dL (ref 0.61–1.24)
GFR calc non Af Amer: >60 mL/min (ref >60)
Glucose, Bld: 106 mg/dL — ABNORMAL HIGH (ref 65–99)
POTASSIUM: 3.7 mmol/L (ref 3.5–5.1)
Sodium: 130 mmol/L — ABNORMAL LOW (ref 135–145)

## 2014-06-23 LAB — PREPARE RBC (CROSSMATCH)

## 2014-06-23 LAB — LACTIC ACID, PLASMA: Lactic Acid, Venous: 0.9 mmol/L (ref 0.5–2.0)

## 2014-06-23 NOTE — Progress Notes (Signed)
2 Days Post-Op  Subjective: Alert and looks fine.  No distress.    Objective: Vital signs in last 24 hours: Temp:  [98.1 F (36.7 C)-98.6 F (37 C)] 98.6 F (37 C) (05/28 0748) Pulse Rate:  [72-98] 89 (05/28 0748) Resp:  [16-23] 18 (05/28 0748) BP: (82-112)/(38-71) 99/56 mmHg (05/28 0748) SpO2:  [100 %] 100 % (05/28 0748) Arterial Line BP: (77-108)/(47-92) 77/69 mmHg (05/28 0345) FiO2 (%):  [40 %] 40 % (05/27 0839) Weight:  [97.6 kg (215 lb 2.7 oz)] 97.6 kg (215 lb 2.7 oz) (05/28 0418) Last BM Date: 06/21/14 240 PO  Soft diet Afebrile, SBP in 90's Extubated NA 130 WBC up but stable H/H 7.6/22.7  Intake/Output from previous day: 05/27 0701 - 05/28 0700 In: 1801.6 [P.O.:240; I.V.:1561.6] Out: 1550 [Urine:1550] Intake/Output this shift:    General appearance: alert, cooperative and no distress GI: soft, non-tender; bowel sounds normal; no masses,  no organomegaly  Lab Results:   Recent Labs  06/22/14 1756 06/23/14 0400  WBC 12.5* 12.3*  HGB 8.2* 7.6*  HCT 24.3* 22.7*  PLT 168 172    BMET  Recent Labs  06/22/14 0500 06/23/14 0400  NA 130* 130*  K 3.9 3.7  CL 102 103  CO2 23 24  GLUCOSE 123* 106*  BUN 31* 20  CREATININE 1.14 1.07  CALCIUM 7.8* 7.8*   PT/INR  Recent Labs  06/21/14 0236  LABPROT 17.0*  INR 1.37    No results for input(s): AST, ALT, ALKPHOS, BILITOT, PROT, ALBUMIN in the last 168 hours.   Lipase  No results found for: LIPASE   Studies/Results: Dg Chest Portable 1 View  06/21/2014   CLINICAL DATA:  Status post EGD for upper GI bleed, evaluate ETT  EXAM: PORTABLE CHEST - 1 VIEW  COMPARISON:  06/16/2014  FINDINGS: Endotracheal tube terminates 4 cm above the carina.  Mild blunting of the left costophrenic angle. Mild left basilar atelectasis. Lungs otherwise clear. No pleural effusion or pneumothorax.  Cardiomegaly.  Right IJ venous catheter terminates at the cavoatrial junction.  IMPRESSION: Endotracheal tube terminates 4 cm above the  carina.  Right IJ venous catheter terminates at the cavoatrial junction.  No pneumothorax.   Electronically Signed   By: Julian Hy [name]   On: 06/21/2014 16:25    Medications: . antiseptic oral rinse  7 mL Mouth Rinse q12n4p  . bisacodyl  5 mg Oral Once  . 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   LOS: 9 days    [name] 06/23/2014

## 2014-06-23 NOTE — Progress Notes (Signed)
2 Days Post-Op Procedure(s) (LRB): ESOPHAGOGASTRODUODENOSCOPY (EGD) (N/A) Subjective: Patient is hypotensive on neo with severe anemia- will transfuse 2 units PRBC so neo can be weaned  patient not currently candidate for cardiac surgery- will need to be ambulatory w/  PT and neo weaned off  Will follow  Objective: Vital signs in last 24 hours: Temp:  [98.1 F (36.7 C)-98.6 F (37 C)] 98.6 F (37 C) (05/28 0748) Pulse Rate:  [72-98] 89 (05/28 0800) Cardiac Rhythm:  [-] Atrial fibrillation (05/28 0800) Resp:  [16-23] 19 (05/28 0800) BP: (82-112)/(38-71) 99/63 mmHg (05/28 0800) SpO2:  [100 %] 100 % (05/28 0800) Arterial Line BP: (77-108)/(47-92) 77/69 mmHg (05/28 0345) FiO2 (%):  [40 %] 40 % (05/27 0839) Weight:  [215 lb 2.7 oz (97.6 kg)] 215 lb 2.7 oz (97.6 kg) (05/28 0418)  Hemodynamic parameters for last 24 hours: CVP:  [6 mmHg] 6 mmHg  Intake/Output from previous day: 05/27 0701 - 05/28 0700 In: 1801.6 [P.O.:240; I.V.:1561.6] Out: 1550 [Urine:1550] Intake/Output this shift:      Lab Results:  Recent Labs  06/22/14 1756 06/23/14 0400  WBC 12.5* 12.3*  HGB 8.2* 7.6*  HCT 24.3* 22.7*  PLT 168 172   BMET:  Recent Labs  06/22/14 0500 06/23/14 0400  NA 130* 130*  K 3.9 3.7  CL 102 103  CO2 23 24  GLUCOSE 123* 106*  BUN 31* 20  CREATININE 1.14 1.07  CALCIUM 7.8* 7.8*    PT/INR:  Recent Labs  06/21/14 0236  LABPROT 17.0*  INR 1.37   ABG    Component Value Date/Time   PHART 7.440 06/19/2014 0808   HCO3 22.3 06/19/2014 0823   TCO2 23 06/19/2014 0823   ACIDBASEDEF 2.0 06/19/2014 0823   O2SAT 59.3 06/20/2014 0500   CBG (last 3)   Recent Labs  06/21/14 1952 06/22/14 0010 06/22/14 0343  GLUCAP 133* 129* 126*    Assessment/Plan: S/P Procedure(s) (LRB): ESOPHAGOGASTRODUODENOSCOPY (EGD) (N/A) prosthetic valve AI UGI bleed from gastric erosion nectrotic teeth- dental abscess Will call for dental eval tues  LOS: 9 days    Tharon Aquas Trigt  III 06/23/2014

## 2014-06-23 NOTE — Progress Notes (Signed)
Patient Name: [name] Date of Encounter: 06/23/2014   SUBJECTIVE  69 y/o male admitted with cardiogenic shock due to partially thrombosed mechanical AoV after stopping coumadin for unclear reasons several months ago. Had duodenal ulcer in 2014 clipped at [facility] Developed GI bleed on integrelin, ASA and heparin which has now been discontinued. Patient is s/p cath showing no CAD; patient was scheduled for AVR today but cancelled due to recurrent melena.   Pt had EGD twice  06/21/2014- First EGD aborted due to concern for aspiration, showed large blood clot, prior erosions with ozing blood vessel, 4 gastric clips were used and epinephrine injected.  Last bowel movement was 2 days ago, prior to EGD. Non since then. Some abdominal discomfort when he coughs, no chest pain or SOB.  CURRENT MEDS . antiseptic oral rinse  7 mL Mouth Rinse q12n4p  . bisacodyl  5 mg Oral Once  . sodium chloride  3 mL Intravenous Q12H   . sodium chloride 20 mL/hr at 06/21/14 1015  . pantoprozole (PROTONIX) infusion 8 mg/hr (06/22/14 2141)  . phenylephrine (NEO-SYNEPHRINE) Adult infusion 90 mcg/min (06/23/14 0226)   OBJECTIVE  Filed Vitals:   06/23/14 0400 06/23/14 0418 06/23/14 0500 06/23/14 0600  BP: 88/57  92/63 112/71  Pulse: 94 88 95 96  Temp: 98.1 F (36.7 C)     TempSrc: Oral     Resp: 18 20 19 20   Height:      Weight:  215 lb 2.7 oz (97.6 kg)    SpO2: 100% 100% 100% 100%    Intake/Output Summary (Last 24 hours) at 06/23/14 0702 Last data filed at 06/23/14 0600  Gross per 24 hour  Intake 1801.56 ml  Output   1550 ml  Net 251.56 ml   Filed Weights   06/21/14 0343 06/22/14 0500 06/23/14 0418  Weight: 207 lb 10.8 oz (94.2 kg) 214 lb 4.6 oz (97.2 kg) 215 lb 2.7 oz (97.6 kg)   PHYSICAL EXAM  General: NAD, lying in bed, responds to question, not in any distress Neuro: Alert and oriented X 3. Moves all extremities spontaneously. HEENT:  Normal, AT, [place], central line- right IJ Lungs:   CTA Heart: Irregular  3/6 systolic murmur all 4 areas on auscultation but heard best aortic area, Abdomen: Soft, full, non-tender, bowel sounds present Extremities: no edema, warm and well perfused.  CBC  Recent Labs  06/22/14 1756 06/23/14 0400  WBC 12.5* 12.3*  NEUTROABS  --  9.3*  HGB 8.2* 7.6*  HCT 24.3* 22.7*  MCV 86.2 88.0  PLT 168 825   Basic Metabolic Panel  Recent Labs  06/22/14 0500 06/23/14 0400  NA 130* 130*  K 3.9 3.7  CL 102 103  CO2 23 24  GLUCOSE 123* 106*  BUN 31* 20  CREATININE 1.14 1.07  CALCIUM 7.8* 7.8*   Radiology/Studies  Dg Orthopantogram  06/19/2014   CLINICAL DATA:  Aortic stenosis, preop  EXAM: ORTHOPANTOGRAM/PANORAMIC  COMPARISON:  None available  FINDINGS: Multiple missing teeth and restorations. Caries involving teeth 3 and 4. Periapical lucency/erosion involving tooth number 13.  IMPRESSION: 1. Dental caries and periapical disease as above.   Electronically Signed   By: Lucrezia Europe [name]   On: 06/19/2014 16:52   Ct Head Wo Contrast  06/19/2014   CLINICAL DATA:  Patient with cognitive deficits and expressive aphasia.  EXAM: CT HEAD WITHOUT CONTRAST  TECHNIQUE: Contiguous axial images were obtained from the base of the skull through the vertex without intravenous contrast.  COMPARISON:  None.  FINDINGS: Ventricles and sulci are appropriate for patient's age. No evidence for acute cortically based infarct, intracranial hemorrhage, mass lesion or mass-effect. Orbits are unremarkable. Polypoid mucosal thickening bilateral maxillary sinuses. Frontal sinus and ethmoid air cells are unremarkable. Mastoid air cells are well aerated. Calvarium is intact. Zygomatic arches are unremarkable.  IMPRESSION: No acute intracranial process.  Polypoid mucosal thickening paranasal sinuses.   Electronically Signed   By: Lovey Newcomer [name]   On: 06/19/2014 15:30   Dg Chest Portable 1 View  06/21/2014   CLINICAL DATA:  Status post EGD for upper GI bleed, evaluate ETT   EXAM: PORTABLE CHEST - 1 VIEW  COMPARISON:  06/16/2014  FINDINGS: Endotracheal tube terminates 4 cm above the carina.  Mild blunting of the left costophrenic angle. Mild left basilar atelectasis. Lungs otherwise clear. No pleural effusion or pneumothorax.  Cardiomegaly.  Right IJ venous catheter terminates at the cavoatrial junction.  IMPRESSION: Endotracheal tube terminates 4 cm above the carina.  Right IJ venous catheter terminates at the cavoatrial junction.  No pneumothorax.   Electronically Signed   By: Julian Hy [name]   On: 06/21/2014 16:25   TELE: a-fib, rates- up to 90s to low 100s, but previosuly better controlled in 50s.   Echo- TEE- 5/20- EF- 46-50%, Systolic function was normal. The estimated ejection fraction was in the range of 55% to 60%. Wall motion was normal; there were no regional wall motion abnormalities. - Aortic valve: St. Jude Mechanical AVR is present. Only one of the leaflets can be visualized as moving and that movement is significantly reduced. The other leaflet is immobile. There is severe aortic insufficiency that is best appreciated in the 120 degree view. 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   1. 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  2. Cardiogenic Shock with diastolic CHF exacerbation- likely due to valve thrombosis. - On pressors- neosynephrine. 3. Acute GI bleed with melena with gastric cardia erosions noted on EGD, EGD twice on 06/21/2014- First EGD aborted due to concern for aspiration, showed large blood clot, prior  erosions with ozing blood vessel, 4 gastric clips were used and epinephrine injected. S/p 6 units of blood since admission. No bowel movements since EGD. Hgb today- down to 7.6 from 8.2 yesterday.  - Transfuse 2 units today, surgery on hold for now. Hgb goal >8.  - IV lasix 20mg  with trasfusion - Plans to switch to BID protonix today 4. Chronic Atrial Fib- Rate controlled, anticoag held. 5. Thrombocytopenia- Stable, 172 today. 6. 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 The coronary reconstruction was with a 8 mm Gore-Tex graft Cabral type repair in 1993  - Cath results- No Obstructive disease, 10% stenosis- LAD and Circumflex.  Ronald Pippins, PGY-2 Resident 7:02 AM 06/23/2014   The patient was seen, examined and discussed with Emokpae, Ejiroghene,MD and I agree with the above.   69 year old male with prosthetic valve dysfunction/thrombosis with severe AI, admitted in cardiogenic shock, scheduled for a surgery but  cancelled sec to GIB - s/p EGD with placement of gastric clips, continues to drop Hb, Dr Nils Pyle saw this am, ordered 2 uniits of PRBC and surgery on hold for now. He is on neosynephrine for hypotension.  We will follow closely for signs of CHF after blood transfusions.    Dorothy Spark 06/23/2014

## 2014-06-23 NOTE — Progress Notes (Signed)
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 He was tx to [facility] 5/19 from [place] with cardiogenic shock r/t severe aortic valve insufficiency/ partially thrombosed mechanical AoV after stopping coumadin for unclear reasons several months prior.  He was treated with integrelin, asa and heparin and was scheduled for AVR 5/26 but developed recurrent melena and surgery put on hold.  He was seen in consultation by GI and underwent endoscopy 5/26 with clipping of gastric erosion under general anesthesia and PCCM re-consulted for post-procedure vent management.   STUDIES:  CT head 5/24>>> negative  Dental orthopantogram 5/24>>> Dental caries and periapical disease as above.                                                                                                                                                                                                                                                                                                 SIGNIFICANT EVENTS: 5/26>> EGD with clipping (x4) of gastric erosion Cath 5/20>>> - No Obstructive disease, 10% stenosis- LAD and Circumflex. 5/27 extubated  SUBJECTIVE:  No bleeding overnight, Hgb down, still on neo, a-line bad  VITAL SIGNS: Temp:  [98.1 F (36.7 C)-98.6 F (37 C)] 98.6 F (37 C) (05/28 0748) Pulse Rate:  [72-98] 89 (05/28 0800) Resp:  [16-23] 19 (05/28 0800) BP: (82-112)/(42-71) 99/63 mmHg (05/28 0800) SpO2:  [100 %] 100 % (05/28 0800) Arterial Line BP: (77-108)/(47-92) 77/69 mmHg (05/28 0345) Weight:  [97.6 kg (215 lb 2.7 oz)] 97.6 kg (215 lb 2.7 oz) (05/28 0418) HEMODYNAMICS: CVP:  [6 mmHg] 6 mmHg VENTILATOR SETTINGS:   INTAKE / OUTPUT:  Intake/Output Summary (Last 24 hours) at 06/23/14 0845 Last data  filed at 06/23/14 0800  Gross per 24 hour  Intake 1640.2 ml  Output   1550 ml  Net   90.2 ml    PHYSICAL EXAMINATION: Gen: awake , comfortable HENT: NCAT, OP clear  PULM: CTA B,  CV: RRR, no mgr, trace edema GI: BS+, soft, nontender Derm: no cyanosis or rash Neuro: A&Ox4, maew   LABS:  CBC  Recent Labs Lab 06/22/14 0500 06/22/14 1756 06/23/14 0400  WBC 11.9* 12.5* 12.3*  HGB 8.8* 8.2* 7.6*  HCT 25.7* 24.3* 22.7*  PLT 161 168 172   Coag's  Recent Labs Lab 06/21/14 0236  APTT 31  INR 1.37   BMET  Recent Labs Lab 06/21/14 0236 06/22/14 0500 06/23/14 0400  NA 130* 130* 130*  K 3.8 3.9 3.7  CL 99* 102 103  CO2 22 23 24   BUN 24* 31* 20  CREATININE 1.04 1.14 1.07  GLUCOSE 133* 123* 106*   Electrolytes  Recent Labs Lab 06/21/14 0236 06/22/14 0500 06/23/14 0400  CALCIUM 7.9* 7.8* 7.8*   Sepsis Markers No results for input(s): LATICACIDVEN, PROCALCITON, O2SATVEN in the last 168 hours. ABG  Recent Labs Lab 06/19/14 0808  PHART 7.440  PCO2ART 31.6*  PO2ART 77.0*   Liver Enzymes No results for input(s): AST, ALT, ALKPHOS, BILITOT, ALBUMIN in the last 168 hours. Cardiac Enzymes No results for input(s): TROPONINI, PROBNP in the last 168 hours. Glucose  Recent Labs Lab 06/20/14 1203 06/20/14 1644 06/21/14 1952 06/22/14 0010 06/22/14 0343  GLUCAP 117* 114* 133* 129* 126*    Imaging 5/26 CXR> ETT in place, no infiltrate, LAE noted   ASSESSMENT / PLAN:  PULMONARY OETT 5/26 Acute respiratory failure - post endoscopy procedure  P:   Monitor O2 Incentive spirometry, oral care afterwards  CARDIOVASCULAR CVL (DB) R IJ 5/21>>> Prosthetic aortic valve dysfunction/ thrombosis with severe AI  Shock 5/28 > cardiogenic vs anemic, does not appear septic Acute on chronic dCHF  Afib  Hx type A aortic dissection P:  PRBC transfusion ordered For AVR planning per cardiology and CVTS Wean neosynephrine for MAP >65 D/c a-line Repeat lactic  acid  RENAL Hyponatremia  P:   Monitor BMET and UOP Replace electrolytes as needed   GASTROINTESTINAL GI bleeding - gastric/duodenal ulcer s/p EGD with clipping 5/26 P:   Cont protonix gtt  GI/surgery following, nutrition per GI Stat CBC  HEMATOLOGIC Blood loss anemia - r/t GI bleeding  Prosthetic aortic valve thrombus  P:  PRBC transfusion today Trend cbc  Continue to hold anticoagulation  SCD's   INFECTIOUS No indication active infection  P:   Monitor for fever  ENDOCRINE No active issue  P:   Monitor glucose on chem   NEUROLOGIC No active issue  P:   Hold sedatives   FAMILY  - Updates: son Kendric updated bedside 5/27 AM  - Inter-disciplinary family meet or Palliative Care meeting due by:  6/2  The patient is critically ill with multiple organ systems failure and requires high complexity decision making for assessment and support, frequent evaluation and titration of therapies, application of advanced monitoring technologies and extensive interpretation of multiple databases.   Critical Care Time devoted to patient care services described in this note is 35  Minutes.  Roselie Awkward, MD [place] [facility] Pager: [phone] Cell: [phone] After 3pm or if no response, call [phone]

## 2014-06-24 ENCOUNTER — Inpatient Hospital Stay (HOSPITAL_COMMUNITY): Payer: Commercial Managed Care - HMO

## 2014-06-24 DIAGNOSIS — M7989 Other specified soft tissue disorders: Secondary | ICD-10-CM

## 2014-06-24 DIAGNOSIS — M1000 Idiopathic gout, unspecified site: Secondary | ICD-10-CM | POA: Insufficient documentation

## 2014-06-24 LAB — CBC
HCT: 25 % — ABNORMAL LOW (ref 39.0–52.0)
Hemoglobin: 8.3 g/dL — ABNORMAL LOW (ref 13.0–17.0)
MCH: 29 pg (ref 26.0–34.0)
MCHC: 33.2 g/dL (ref 30.0–36.0)
MCV: 87.4 fL (ref 78.0–100.0)
Platelets: 172 10*3/uL (ref 150–400)
RBC: 2.86 MIL/uL — ABNORMAL LOW (ref 4.22–5.81)
RDW: 18.7 % — ABNORMAL HIGH (ref 11.5–15.5)
WBC: 10.8 10*3/uL — ABNORMAL HIGH (ref 4.0–10.5)

## 2014-06-24 LAB — COMPREHENSIVE METABOLIC PANEL
ALT: 18 U/L (ref 17–63)
AST: 32 U/L (ref 15–41)
Albumin: 2.3 g/dL — ABNORMAL LOW (ref 3.5–5.0)
Alkaline Phosphatase: 44 U/L (ref 38–126)
Anion gap: 3 — ABNORMAL LOW (ref 5–15)
BUN: 12 mg/dL (ref 6–20)
CO2: 23 mmol/L (ref 22–32)
Calcium: 7.9 mg/dL — ABNORMAL LOW (ref 8.9–10.3)
Chloride: 104 mmol/L (ref 101–111)
Creatinine, Ser: 1.02 mg/dL (ref 0.61–1.24)
GFR calc Af Amer: >60 mL/min (ref >60)
GFR calc non Af Amer: >60 mL/min (ref >60)
Glucose, Bld: 114 mg/dL — ABNORMAL HIGH (ref 65–99)
Potassium: 3.6 mmol/L (ref 3.5–5.1)
Sodium: 130 mmol/L — ABNORMAL LOW (ref 135–145)
Total Bilirubin: 1.2 mg/dL (ref 0.3–1.2)
Total Protein: 5.5 g/dL — ABNORMAL LOW (ref 6.5–8.1)

## 2014-06-24 LAB — HEPARIN LEVEL (UNFRACTIONATED): Heparin Unfractionated: 0.18 IU/mL — ABNORMAL LOW (ref 0.30–0.70)

## 2014-06-24 MED ORDER — COLCHICINE 0.6 MG PO TABS
0.6000 mg | ORAL_TABLET | Freq: Once | ORAL | Status: AC
  Administered 2014-06-24: 0.6 mg via ORAL
  Filled 2014-06-24: qty 1

## 2014-06-24 MED ORDER — PANTOPRAZOLE SODIUM 40 MG IV SOLR
40.0000 mg | Freq: Two times a day (BID) | INTRAVENOUS | Status: DC
  Administered 2014-06-24 – 2014-06-29 (×11): 40 mg via INTRAVENOUS
  Filled 2014-06-24 (×14): qty 40

## 2014-06-24 MED ORDER — HEPARIN (PORCINE) IN NACL 100-0.45 UNIT/ML-% IJ SOLN
1250.0000 [IU]/h | INTRAMUSCULAR | Status: DC
  Administered 2014-06-24: 1050 [IU]/h via INTRAVENOUS
  Filled 2014-06-24 (×2): qty 250

## 2014-06-24 NOTE — Progress Notes (Signed)
There is acute DVT noted in the left brachial and axillary veins. There is superficial thrombosis noted in the left basilic vein.  Difficult situation with GI bleed. H/H today 8.3/25 .  Transfused 2 U PRBCs yesterday.  had EGD with epi injection and clipping to gastric cardia erosion with visible vessel.  Discussed with Dr. Caryl Comes and Dr. Meda Coffee.  Will add IV heparin without bolus and if increase bleeding low target dose.

## 2014-06-24 NOTE — Progress Notes (Signed)
Cecilie Kicks NP notified of doppler results of pts left UE

## 2014-06-24 NOTE — Progress Notes (Signed)
VASCULAR LAB PRELIMINARY  PRELIMINARY  PRELIMINARY  PRELIMINARY  Left upper extremity venous Doppler completed.    Preliminary report:  There is acute DVT noted in the left brachial and axillary veins.  There is superficial thrombosis noted in the left basilic vein.  Ariyan Sinnett, RVT 06/24/2014, 3:22 PM

## 2014-06-24 NOTE — Progress Notes (Signed)
Patient Name: UMAR PATMON Date of Encounter: 06/24/2014   SUBJECTIVE  69 y/o male admitted with cardiogenic shock due to partially thrombosed mechanical AoV after stopping coumadin for unclear reasons several months ago. Had duodenal ulcer in 2014 clipped at [facility] Developed GI bleed on integrelin, ASA and heparin which has now been discontinued. Patient is s/p cath showing no CAD; patient was scheduled for AVR on 06/21/14 but the procedure was cancelled due to recurrent melena.  He subsequently had EGD with epi injection and clipping to gastric cardia erosion with visible vessel.  He was seen and examined this morning.   OBJECTIVE  Filed Vitals:   06/24/14 0600 06/24/14 0630 06/24/14 0700 06/24/14 0800  BP: 86/58 84/49 95/60  98/65  Pulse: 84 92 92 91  Temp:    98.2 F (36.8 C)  TempSrc:    Oral  Resp: 17 19 23 22   Height:      Weight:      SpO2: 100% 100% 97% 100%    Intake/Output Summary (Last 24 hours) at 06/24/14 0919 Last data filed at 06/24/14 0800  Gross per 24 hour  Intake 1896.1 ml  Output   1175 ml  Net  721.1 ml   Filed Weights   06/22/14 0500 06/23/14 0418 06/24/14 0346  Weight: 214 lb 4.6 oz (97.2 kg) 215 lb 2.7 oz (97.6 kg) 213 lb 3 oz (96.7 kg)   PHYSICAL EXAM  General: NAD, lying in bed, responds to question, not in any distress Neuro: Alert and oriented X 3. Moves all extremities spontaneously. HEENT:  Normal, AT, [place] Lungs:  CTA Heart: Irregular  3/6 systolic murmur all 4 areas on auscultation but heard best aortic area, Abdomen: Soft, full, non-tender, bowel sounds present Extremities: no edema, warm and well perfused.  CBC  Recent Labs  06/23/14 0400 06/24/14 0350  WBC 12.3* 10.8*  NEUTROABS 9.3*  --   HGB 7.6* 8.3*  HCT 22.7* 25.0*  MCV 88.0 87.4  PLT 172 157   Basic Metabolic Panel  Recent Labs  06/23/14 0400 06/24/14 0350  NA 130* 130*  K 3.7 3.6  CL 103 104  CO2 24 23  GLUCOSE 106* 114*  BUN 20 12  CREATININE 1.07  1.02  CALCIUM 7.8* 7.9*   Radiology/Studies  Dg Orthopantogram  06/19/2014   CLINICAL DATA:  Aortic stenosis, preop  EXAM: ORTHOPANTOGRAM/PANORAMIC  COMPARISON:  None available  FINDINGS: Multiple missing teeth and restorations. Caries involving teeth 3 and 4. Periapical lucency/erosion involving tooth number 13.  IMPRESSION: 1. Dental caries and periapical disease as above.   Electronically Signed   By: Lucrezia Europe [name]   On: 06/19/2014 16:52   Ct Head Wo Contrast  06/19/2014   CLINICAL DATA:  Patient with cognitive deficits and expressive aphasia.  EXAM: CT HEAD WITHOUT CONTRAST  TECHNIQUE: Contiguous axial images were obtained from the base of the skull through the vertex without intravenous contrast.  COMPARISON:  None.  FINDINGS: Ventricles and sulci are appropriate for patient's age. No evidence for acute cortically based infarct, intracranial hemorrhage, mass lesion or mass-effect. Orbits are unremarkable. Polypoid mucosal thickening bilateral maxillary sinuses. Frontal sinus and ethmoid air cells are unremarkable. Mastoid air cells are well aerated. Calvarium is intact. Zygomatic arches are unremarkable.  IMPRESSION: No acute intracranial process.  Polypoid mucosal thickening paranasal sinuses.   Electronically Signed   By: Lovey Newcomer [name]   On: 06/19/2014 15:30   Dg Chest Portable 1 View  06/21/2014   CLINICAL DATA:  Status post EGD for upper GI bleed, evaluate ETT  EXAM: PORTABLE CHEST - 1 VIEW  COMPARISON:  06/16/2014  FINDINGS: Endotracheal tube terminates 4 cm above the carina.  Mild blunting of the left costophrenic angle. Mild left basilar atelectasis. Lungs otherwise clear. No pleural effusion or pneumothorax.  Cardiomegaly.  Right IJ venous catheter terminates at the cavoatrial junction.  IMPRESSION: Endotracheal tube terminates 4 cm above the carina.  Right IJ venous catheter terminates at the cavoatrial junction.  No pneumothorax.   Electronically Signed   By: Julian Hy [name]    On: 06/21/2014 16:25   Telemetry:  Irregular, HR 90s  Echo- TEE- 5/20- EF- 57-01%, Systolic function was normal. The estimated ejection fraction was in the range of 55% to 60%. Wall motion was normal; there were no regional wall motion abnormalities. - Aortic valve: St. Jude Mechanical AVR is present. Only one of the leaflets can be visualized as moving and that movement is significantly reduced. The other leaflet is immobile. There is severe aortic insufficiency that is best appreciated in the 120 degree view. 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  06/19/14 cardiac catheterization  Prior ascending aortic dissection which prompted aortic valve replacement and bypass to the left main and RCA. The bypass appears widely patent.  Significant false lumen still present. There is some difficulty in getting to the coronary vessels and a versa core wire had to be used for this.  There does not appear to be any significant coronary artery disease in the major epicardial vessels. It is somewhat difficult to see the distal RCA system.  Essentially normal right heart pressures. Hardy 8 output 5.9 L/m. Cardiac index 2.8. pulmonary artery saturation 54%. Plan for high risk aortic valve replacement. If further imaging is required, consider coronary CT to better visualize the anatomy.  Scheduled Meds: . antiseptic oral rinse  7 mL Mouth Rinse q12n4p  . bisacodyl  5 mg Oral Once  . sodium chloride  3 mL Intravenous Q12H   Continuous Infusions: . sodium chloride 20 mL/hr at 06/21/14 1015  . pantoprozole (PROTONIX) infusion 8 mg/hr (06/24/14 0842)  . phenylephrine (NEO-SYNEPHRINE) Adult infusion Stopped (06/24/14 0400)   PRN Meds:.[MAR Hold] sodium chloride, [MAR Hold] sodium chloride, [MAR Hold] acetaminophen, fentaNYL (SUBLIMAZE) injection, [MAR Hold] ondansetron (ZOFRAN) IV, [MAR Hold] sodium  chloride  ASSESSMENT AND PLAN: 69 year old male with h/o aortic root and aortic valve replacement at [facility] in 1993 with 25 mm St Jude valve conduit, who presented to [name] 5/18 with acute CHF presumably sec to valve dysfunction.   1. 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  Appreciate CTS recommendations.  Needs aortic valve replacement but currently not a candidate for surgery until he is ambulatory with PT and his pressors weaned off.  Neo was stopped this morning.   - work with PT as tolerated - addressing GI bleeding as below  2. Cardiogenic Shock with diastolic CHF exacerbation- likely due to valve thrombosis.  Pressor stopped this morning.  BP soft but MAP 70s.  3. Acute GI bleed with melena with gastric cardia erosions noted on EGD, EGD twice on 06/21/2014- First EGD aborted due to concern for aspiration, showed large blood clot, prior erosions with ozing blood vessel, 4 gastric clips were used and epinephrine injected. S/p 6 units of blood since admission. No bowel movements since EGD. Hgb  8.3 s/p 1 unit PRBCs yesterday. - monitor CBC; transfuse for Hgb < 8; give IV lasix if transfusion needed - change PPI gtt to BID  4. Chronic Atrial Fib:  Rate controlled, anticoag held due to bleeding.  5. Thrombocytopenia:  Resolved.  172K.  6. 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 The coronary reconstruction was with a 8 mm Gore-Tex graft Cabral type repair in 1993  Duwaine Maxin DO IMTS PGY2 on CCU Service 9:19 AM 06/24/2014   The patient was seen, examined and discussed with Duwaine Maxin DO and I agree with the above.   69 year old male with prosthetic valve dysfunction/thrombosis with severe AI, admitted in cardiogenic shock, scheduled for a surgery but cancelled sec to GIB - s/p EGD with placement of gastric clips, continues to drop Hb, Dr Nils Pyle  saw this yesterday, received 1 unit of PRBC, no signs of fluid overload.  Hb 8.3 today, we will continue to monitor over the weekend, still had some black stools this however less than before.  Neosynephrine was held at 4 am, normotensive now. If he continues to bleed we will ask GI for help again.  He has developed LUE this am, we will order for a UE Duplex to rule out DVT.   Dorothy Spark 06/24/2014

## 2014-06-24 NOTE — Progress Notes (Signed)
ANTICOAGULATION CONSULT NOTE - Initial Consult  Pharmacy Consult for Heparin Indication: DVT  No Known Allergies  Patient Measurements: Height: 5\' 8"  (172.7 cm) Weight: 213 lb 3 oz (96.7 kg) IBW/kg (Calculated) : 68.4 Heparin Dosing Weight: 90kg   Vital Signs: Temp: 98.7 F (37.1 C) (05/29 1559) Temp Source: Oral (05/29 1559) BP: 120/87 mmHg (05/29 1600) Pulse Rate: 93 (05/29 1600)  Labs:  Recent Labs  06/22/14 0500 06/22/14 1756 06/23/14 0400 06/24/14 0350  HGB 8.8* 8.2* 7.6* 8.3*  HCT 25.7* 24.3* 22.7* 25.0*  PLT 161 168 172 172  CREATININE 1.14  --  1.07 1.02    Estimated Creatinine Clearance: 79.2 mL/min (by C-G formula based on Cr of 1.02).   Medical History: Past Medical History  Diagnosis Date  . Chest pain   . Hypertension   . Aortic aneurysm 1993  . 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 Pneumonia   . Aortic valve replaced      At [facility]  . Hx of aortic aneurysm repair 06/14/2014      Assessment: 69yom with Hx AVR admitted with AV thrombus after stopping his warfarin 3 months ago - on his own.  While being treated with heparin and integrilin for the clot, he developed GIB - s/p EGD with epi injection and clipping to gastric cardia erosion with visible vessel.  Anticoagulation was held and PRBC and PPI given.  He now has developed LUE DVT.  Plan is to restart heparin drip at a low rate without a bolus and keep HL at low end of therapeutic goal.  CBC low stable after PRBC x7 last 5/28  Goal of Therapy:  Heparin level 0.3-0.5 units/ml Monitor platelets by anticoagulation protocol: Yes   Plan:  No bolus Begin heparin drip rate 1050 uts/hr Check HL 6hr after start and daily  Bonnita Nasuti Pharm.D. CPP, BCPS Clinical Pharmacist [phone] 06/24/2014 4:48 PM

## 2014-06-24 NOTE — Evaluation (Signed)
Physical Therapy Evaluation Patient Details Name: [name] MRN: [id number] DOB: May 11, 1945 Today's Date: 06/24/2014   History of Present Illness  69 yo male admitted with aortic insufficiency; hx aortic valve replacement in the 1990's, splenectomy. He was tx to [facility] 5/19 from [place] with cardiogenic shock r/t severe aortic valve insufficiency/ partially thrombosed mechanical AoV after stopping coumadin for unclear reasons several months prior. scheduled for Aortic Valve REplacement, but surgery put on hold secondary to melena, endoscopy 5/26; Difficulty with respiratory recovery post, Critical Care on board; Cardiology would like for Mr. Dempster to be ambulatory prior to Aortic Valve Replacement  Clinical Impression   Pt admitted with above diagnosis. Pt currently with functional limitations due to the deficits listed below (see PT Problem List).   Today, bil knee pain significantly limited mobility and activity tolerance; L hand and wrist swollen and warm to touch as well; Unable to tolerate ROM both knees or any weight bearing; pt had indicated he has a history of a gout flare (remote), but that it was hard to tell if this felt the same as that flare; Worht further investigation; Therapist, sports and Drs. Redmond Pulling and [employer] aware;   Pt will benefit from skilled PT to increase their independence and safety with mobility to allow discharge to the venue listed below.    Given current status and medical course this hospitalization, post-acute rehabilitation will likely be needed.  BPs during session as follows: Supine 95/57   HR 92 Sitting EOB 96/61   HR 93 EOB 2 mins 89/64   HR97 Back supine 89/59   HR94      Follow Up Recommendations Other (comment) (Post-acute Rehabilitation)    Equipment Recommendations  Rolling walker with 5" wheels;3in1 (PT);Wheelchair (measurements PT);Wheelchair cushion (measurements PT)    Recommendations for Other Services OT consult     Precautions /  Restrictions Precautions Precautions: Fall Precaution Comments:  (Bilateral knee pain with any ROM on initial PT eval) Restrictions Weight Bearing Restrictions: No      Mobility  Bed Mobility Overal bed mobility: Needs Assistance Bed Mobility: Supine to Sit;Sit to Supine     Supine to sit: Mod assist;+2 for safety/equipment Sit to supine: Mod assist;+2 for safety/equipment   General bed mobility comments: Mod assist  to elevate trunk to sitting  and needed +2 assist to help lower LEs to floor and lift back into bed due to knee pain  Transfers                 General transfer comment: Ultimately, knees to painful for standing attempts  Ambulation/Gait             General Gait Details: Pain limiting upright activity  Stairs            Wheelchair Mobility    Modified Rankin (Stroke Patients Only)       Balance Overall balance assessment: Needs assistance   Sitting balance-Leahy Scale: Fair                                       Pertinent Vitals/Pain Pain Assessment: Faces Faces Pain Scale: Hurts whole lot Pain Location: Bil knees with any movement; pt also with edematous Lhand, warm to touch; RN, MD aware Pain Descriptors / Indicators: Aching;Grimacing Pain Intervention(s): Limited activity within patient's tolerance;Monitored during session;Repositioned    Home Living Family/patient expects to be discharged to:: Private residence Living Arrangements:  Children Available Help at Discharge: Family;Available PRN/intermittently (Need more info) Type of Home: House Home Access: Stairs to enter   [employer] of Steps:  (to be determined) Home Layout: One level Home Equipment: Other (comment) (to be determined) Additional Comments: Pt very internally distracted by anticipation of pain with moving; Will need to very home information in later session    Prior Function           Comments: Will need to get this  information      Hand Dominance   Dominant Hand: Right    Extremity/Trunk Assessment   Upper Extremity Assessment: LUE deficits/detail       LUE Deficits / Details: Hand edematous and warm to touch; limited finger ROM; grimace with movement   Lower Extremity Assessment: RLE deficits/detail;LLE deficits/detail RLE Deficits / Details: Significant grimace with any movement, especially painful with knee flexion as his LEs cleared teh EOB; provided support to both lower legs to be able to reach EOB successfully LLE Deficits / Details: Significant grimace with any movement, especially painful with knee flexion as his LEs cleared teh EOB; provided support to both lower legs to be able to reach EOB successfully     Communication   Communication: Other (comment) (Didn't say much in session)  Cognition Arousal/Alertness: Awake/alert Behavior During Therapy: WFL for tasks assessed/performed (internally distracted, but not particularly restless) Overall Cognitive Status: Impaired/Different from baseline Area of Impairment: Attention               General Comments: Internally distracted at anticipation of bil knee pain with movement; slow to answer questions     General Comments      Exercises        Assessment/Plan    PT Assessment Patient needs continued PT services  PT Diagnosis Acute pain;Difficulty walking;Generalized weakness   PT Problem List Decreased strength;Decreased range of motion;Decreased activity tolerance;Decreased balance;Decreased mobility;Decreased coordination;Decreased knowledge of use of DME;Decreased knowledge of precautions;Pain;Cardiopulmonary status limiting activity  PT Treatment Interventions DME instruction;Gait training;Stair training;Functional mobility training;Therapeutic activities;Therapeutic exercise;Balance training;Patient/family education   PT Goals (Current goals can be found in the Care Plan section) Acute Rehab PT Goals Patient Stated  Goal: Hesitant to get up; looks forward to his AVR surgery PT Goal Formulation: With patient Time For Goal Achievement: 07/08/14 Potential to Achieve Goals: Good    Frequency Min 3X/week   Barriers to discharge Other (comment) Will need more info re: home situation, equipment and available assist; Given history, and medical course thus far, post-acute rehab is likely indicated    Co-evaluation               End of Session Equipment Utilized During Treatment:  (bed pad helpful to square off hips) Activity Tolerance: Patient limited by pain Patient left: in bed;with call bell/phone within reach;with nursing/sitter in room;Other (comment) (Cardiology in room) Nurse Communication: Mobility status;Other (comment) (Painful LEs, painful LUE)         Time: 0912-0929 PT Time Calculation (min) (ACUTE ONLY): 17 min   Charges:   PT Evaluation $Initial PT Evaluation Tier I: 1 Procedure     PT G CodesRoney Marion Hamff 06/24/2014, 1:14 PM  Roney Marion, [place]  Acute Rehabilitation Services Pager [phone] Office [phone]

## 2014-06-24 NOTE — Progress Notes (Signed)
PULMONARY / CRITICAL CARE MEDICINE   Name: [name] MRN: [id number] DOB: 1946-01-25    ADMISSION DATE:  06/14/2014 CONSULTATION DATE:  06/21/14   INITIAL PRESENTATION:  69 yo male with hx aortic valve replacement in the 1990's, splenectomy. He was tx to [facility] 5/19 from [place] with cardiogenic shock r/t severe aortic valve insufficiency/ partially thrombosed mechanical AoV after stopping coumadin for unclear reasons several months prior.  He was treated with integrelin, asa and heparin and was scheduled for AVR 5/26 but developed recurrent melena and surgery put on hold.  He was seen in consultation by GI and underwent endoscopy 5/26 with clipping of gastric erosion under general anesthesia and PCCM re-consulted for post-procedure vent management.   STUDIES:  CT head 5/24>>> negative  Dental orthopantogram 5/24>>> Dental caries and periapical disease as above.                                                                                                                                                                                                                                                                                                 SIGNIFICANT EVENTS: 5/26>> EGD with clipping (x4) of gastric erosion Cath 5/20>>> - No Obstructive disease, 10% stenosis- LAD and Circumflex. 5/27 extubated  SUBJECTIVE:  Left hand swelling, off pressors, no bleeding  VITAL SIGNS: Temp:  [97.8 F (36.6 C)-99.1 F (37.3 C)] 98.2 F (36.8 C) (05/29 0800) Pulse Rate:  [81-100] 87 (05/29 1000) Resp:  [14-23] 19 (05/29 1000) BP: (83-113)/(49-79) 85/53 mmHg (05/29 1000) SpO2:  [94 %-100 %] 100 % (05/29 1000) Weight:  [96.7 kg (213 lb 3 oz)] 96.7 kg (213 lb 3 oz) (05/29 0346) HEMODYNAMICS:   VENTILATOR SETTINGS:   INTAKE / OUTPUT:  Intake/Output Summary (Last 24 hours) at 06/24/14 1033 Last data filed at 06/24/14 1000  Gross per 24 hour  Intake 1965.3 ml  Output   1425 ml  Net   540.3 ml    PHYSICAL EXAMINATION: Gen: awake , comfortable HENT: NCAT, OP clear PULM: CTA B,  CV: RRR, no mgr, trace edema GI: BS+, soft, nontender Derm:  left hand swollen, red, warm, primarily over joints, no crepitance, cap refill normal MSK: bilateral knee swollen, tender, warm, slight effusions Neuro: A&Ox4, maew   LABS:  CBC  Recent Labs Lab 06/22/14 1756 06/23/14 0400 06/24/14 0350  WBC 12.5* 12.3* 10.8*  HGB 8.2* 7.6* 8.3*  HCT 24.3* 22.7* 25.0*  PLT 168 172 172   Coag's  Recent Labs Lab 06/21/14 0236  APTT 31  INR 1.37   BMET  Recent Labs Lab 06/22/14 0500 06/23/14 0400 06/24/14 0350  NA 130* 130* 130*  K 3.9 3.7 3.6  CL 102 103 104  CO2 23 24 23   BUN 31* 20 12  CREATININE 1.14 1.07 1.02  GLUCOSE 123* 106* 114*   Electrolytes  Recent Labs Lab 06/22/14 0500 06/23/14 0400 06/24/14 0350  CALCIUM 7.8* 7.8* 7.9*   Sepsis Markers  Recent Labs Lab 06/23/14 0940  LATICACIDVEN 0.9   ABG  Recent Labs Lab 06/19/14 0808  PHART 7.440  PCO2ART 31.6*  PO2ART 77.0*   Liver Enzymes  Recent Labs Lab 06/24/14 0350  AST 32  ALT 18  ALKPHOS 44  BILITOT 1.2  ALBUMIN 2.3*   Cardiac Enzymes No results for input(s): TROPONINI, PROBNP in the last 168 hours. Glucose  Recent Labs Lab 06/20/14 1203 06/20/14 1644 06/21/14 1952 06/22/14 0010 06/22/14 0343  GLUCAP 117* 114* 133* 129* 126*    Imaging 5/26 CXR> ETT in place, no infiltrate, LAE noted   ASSESSMENT / PLAN:  PULMONARY OETT 5/26 Acute respiratory failure - resolved P:   Monitor O2 Incentive spirometry, oral care   MSK Left hand swelling > worrisome for venous clot in left arm; exam also consistent with gout (knees, wrist) P: Keep it warm Doppler ultrasound of vessels [place] for possible gout  CARDIOVASCULAR CVL (DB) R IJ 5/21>>> Prosthetic aortic valve dysfunction/ thrombosis with severe AI  Shock 5/28 > cardiogenic vs anemic, does not appear  septic Acute on chronic dCHF  Afib  Hx type A aortic dissection  P:  PRBC transfusion ordered For AVR planning per cardiology and CVTS D/C neosynephrine Keep left hand warm  RENAL Hyponatremia  P:   Monitor BMET and UOP Replace electrolytes as needed   GASTROINTESTINAL GI bleeding - gastric/duodenal ulcer s/p EGD with clipping 5/26 P:   Cont protonix gtt  GI/surgery following, nutrition per GI   HEMATOLOGIC Blood loss anemia - r/t GI bleeding  Prosthetic aortic valve thrombus  P:  PRBC transfusion today Trend cbc  Continue to hold anticoagulation  SCD's   INFECTIOUS No indication active infection  P:   Monitor for fever Monitor hand for redness consider ABX if febrile, exam worsening  ENDOCRINE No active issue  P:   Monitor glucose on chem   NEUROLOGIC No active issue  P:   Hold sedatives   FAMILY  - Updates: son Kendric updated bedside 5/27 AM  PCCM will sign off, call if questions  Roselie Awkward, MD [place] PCCM Pager: [phone] Cell: [phone] After 3pm or if no response, call [phone]

## 2014-06-24 NOTE — Progress Notes (Signed)
Ice pack to left UE, will continue to monitor

## 2014-06-24 NOTE — Progress Notes (Signed)
Dr Meda Coffee notified of pt's LUE edema and  Warmth to skin. Tender to touch. Arm elevated

## 2014-06-25 DIAGNOSIS — I82622 Acute embolism and thrombosis of deep veins of left upper extremity: Secondary | ICD-10-CM

## 2014-06-25 LAB — COMPREHENSIVE METABOLIC PANEL
ALT: 20 U/L (ref 17–63)
AST: 34 U/L (ref 15–41)
Albumin: 2.5 g/dL — ABNORMAL LOW (ref 3.5–5.0)
Alkaline Phosphatase: 51 U/L (ref 38–126)
Anion gap: 7 (ref 5–15)
BUN: 11 mg/dL (ref 6–20)
CO2: 22 mmol/L (ref 22–32)
Calcium: 8.3 mg/dL — ABNORMAL LOW (ref 8.9–10.3)
Chloride: 103 mmol/L (ref 101–111)
Creatinine, Ser: 1.04 mg/dL (ref 0.61–1.24)
GFR calc Af Amer: >60 mL/min (ref >60)
GFR calc non Af Amer: >60 mL/min (ref >60)
Glucose, Bld: 137 mg/dL — ABNORMAL HIGH (ref 65–99)
Potassium: 3.4 mmol/L — ABNORMAL LOW (ref 3.5–5.1)
Sodium: 132 mmol/L — ABNORMAL LOW (ref 135–145)
Total Bilirubin: 1.2 mg/dL (ref 0.3–1.2)
Total Protein: 6.3 g/dL — ABNORMAL LOW (ref 6.5–8.1)

## 2014-06-25 LAB — CBC
HCT: 27.6 % — ABNORMAL LOW (ref 39.0–52.0)
Hemoglobin: 8.9 g/dL — ABNORMAL LOW (ref 13.0–17.0)
MCH: 28.6 pg (ref 26.0–34.0)
MCHC: 32.2 g/dL (ref 30.0–36.0)
MCV: 88.7 fL (ref 78.0–100.0)
Platelets: 246 10*3/uL (ref 150–400)
RBC: 3.11 MIL/uL — ABNORMAL LOW (ref 4.22–5.81)
RDW: 18.6 % — ABNORMAL HIGH (ref 11.5–15.5)
WBC: 8.7 10*3/uL (ref 4.0–10.5)

## 2014-06-25 LAB — HEPARIN LEVEL (UNFRACTIONATED)
Heparin Unfractionated: 0.63 IU/mL (ref 0.30–0.70)
Heparin Unfractionated: 0.38 IU/mL (ref 0.30–0.70)

## 2014-06-25 MED ORDER — SODIUM CHLORIDE 0.9 % IJ SOLN: 10.0000 mL | INTRAMUSCULAR | Status: DC | PRN

## 2014-06-25 MED ORDER — HEPARIN (PORCINE) IN NACL 100-0.45 UNIT/ML-% IJ SOLN
1300.0000 [IU]/h | INTRAMUSCULAR | Status: DC
  Administered 2014-06-25 – 2014-06-26 (×2): 1150 [IU]/h via INTRAVENOUS
  Administered 2014-06-29: 1300 [IU]/h via INTRAVENOUS
  Filled 2014-06-25 (×10): qty 250

## 2014-06-25 MED ORDER — SODIUM CHLORIDE 0.9 % IJ SOLN
10.0000 mL | Freq: Two times a day (BID) | INTRAMUSCULAR | Status: DC
  Administered 2014-06-25: 20 mL
  Administered 2014-06-26 – 2014-06-28 (×5): 10 mL
  Administered 2014-06-28 – 2014-06-29 (×2): 30 mL

## 2014-06-25 NOTE — Progress Notes (Signed)
Patient Name: [name] Date of Encounter: 06/25/2014   SUBJECTIVE  The patient denies SOB, he states that he had a fresh blood in his stool today.  OBJECTIVE  Filed Vitals:   06/25/14 0600 06/25/14 0700 06/25/14 0747 06/25/14 0800  BP: 134/70 119/80 119/80 119/76  Pulse: 84 86 80 85  Temp:   98.1 F (36.7 C)   TempSrc:   Oral   Resp: 14 18 18 17   Height:      Weight:      SpO2: 98% 100% 100% 100%    Intake/Output Summary (Last 24 hours) at 06/25/14 0901 Last data filed at 06/25/14 0700  Gross per 24 hour  Intake 1073.93 ml  Output    450 ml  Net 623.93 ml   Filed Weights   06/23/14 0418 06/24/14 0346 06/25/14 0444  Weight: 215 lb 2.7 oz (97.6 kg) 213 lb 3 oz (96.7 kg) 213 lb 10 oz (96.9 kg)   PHYSICAL EXAM  General: NAD, lying in bed, responds to question, not in any distress Neuro: Alert and oriented X 3. Moves all extremities spontaneously. HEENT:  Normal, AT, [place] Lungs:  CTA Heart: Irregular  3/6 systolic murmur all 4 areas on auscultation but heard best aortic area, Abdomen: Soft, full, non-tender, bowel sounds present Extremities: significant LUE edema, warm and well perfused.  CBC  Recent Labs  06/23/14 0400 06/24/14 0350  WBC 12.3* 10.8*  NEUTROABS 9.3*  --   HGB 7.6* 8.3*  HCT 22.7* 25.0*  MCV 88.0 87.4  PLT 172 300   Basic Metabolic Panel  Recent Labs  06/23/14 0400 06/24/14 0350  NA 130* 130*  K 3.7 3.6  CL 103 104  CO2 24 23  GLUCOSE 106* 114*  BUN 20 12  CREATININE 1.07 1.02  CALCIUM 7.8* 7.9*   Radiology/Studies  Dg Chest Portable 1 View  06/21/2014   CLINICAL DATA:  Status post EGD for upper GI bleed, evaluate ETT  EXAM: PORTABLE CHEST - 1 VIEW  COMPARISON:  06/16/2014  FINDINGS: Endotracheal tube terminates 4 cm above the carina.  Mild blunting of the left costophrenic angle. Mild left basilar atelectasis. Lungs otherwise clear. No pleural effusion or pneumothorax.  Cardiomegaly.  Right IJ venous catheter terminates  at the cavoatrial junction.  IMPRESSION: Endotracheal tube terminates 4 cm above the carina.  Right IJ venous catheter terminates at the cavoatrial junction.  No pneumothorax.   Electronically Signed   By: Julian Hy [name]   On: 06/21/2014 16:25   Telemetry:  Irregular, HR 90s  Echo- TEE- 5/20- EF- 92-33%, Systolic function was normal. The estimated ejection fraction was in the range of 55% to 60%. Wall motion was normal; there were no regional wall motion abnormalities. - Aortic valve: St. Jude Mechanical AVR is present. Only one of the leaflets can be visualized as moving and that movement is significantly reduced. The other leaflet is immobile. There is severe aortic insufficiency that is best appreciated in the 120 degree view. 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  06/19/14 cardiac catheterization  Prior ascending aortic dissection which prompted aortic valve replacement and bypass to the left main and RCA. The bypass appears widely patent.  Significant false lumen still present. There is some difficulty in getting to the coronary vessels and a versa core wire had to be used for this.  There does not appear to be any significant coronary artery  disease in the major epicardial vessels. It is somewhat difficult to see the distal RCA system.  Essentially normal right heart pressures. Hardy 8 output 5.9 L/m. Cardiac index 2.8. pulmonary artery saturation 54%. Plan for high risk aortic valve replacement. If further imaging is required, consider coronary CT to better visualize the anatomy.  Scheduled Meds: . antiseptic oral rinse  7 mL Mouth Rinse q12n4p  . bisacodyl  5 mg Oral Once  . pantoprazole (PROTONIX) IV  40 mg Intravenous Q12H  . sodium chloride  3 mL Intravenous Q12H   Continuous Infusions: . sodium chloride 20 mL/hr at 06/21/14 1015  . heparin 1,250 Units/hr (06/25/14 0017)     PRN Meds:.[MAR Hold] sodium chloride, [MAR Hold] sodium chloride, [MAR Hold] acetaminophen, fentaNYL (SUBLIMAZE) injection, [MAR Hold] ondansetron (ZOFRAN) IV, [MAR Hold] sodium chloride    ASSESSMENT AND PLAN: 69 year old male with h/o aortic root and aortic valve replacement at [facility] in 1993 with 25 mm St Jude valve conduit, who presented to [facility] 5/18 with acute CHF presumably sec to valve dysfunction.   1. 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  Appreciate CTS recommendations.  Needs aortic valve replacement but currently not a candidate for surgery until he is ambulatory with PT and his pressors weaned off.  Neo was stopped this morning.   - work with PT as tolerated - addressing GI bleeding as below  2. Cardiogenic Shock with diastolic CHF exacerbation- likely due to valve thrombosis.  Pressor stopped this morning.  BP soft but MAP 70s.  3. Acute GI bleed with melena with gastric cardia erosions noted on EGD, EGD twice on 06/21/2014- First EGD aborted due to concern for aspiration, showed large blood clot, prior erosions with ozing blood vessel, 4 gastric clips were used and epinephrine injected. S/p 6 units of blood since admission. No bowel movements since EGD. Hgb 8.3 s/p 1 unit PRBCs yesterday. - monitor CBC; transfuse for Hgb < 8; give IV lasix if transfusion needed - change PPI gtt to BID  4. Chronic Atrial Fib:  Rate controlled, anticoag held due to bleeding.  5. Thrombocytopenia:  Resolved.  172K.  6. 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 The coronary reconstruction was with a 8 mm Gore-Tex graft Frederich Cha type repair in 3760   69 year old male with prosthetic valve dysfunction/thrombosis with severe AI, admitted in cardiogenic shock, scheduled for a surgery but cancelled sec to GIB - s/p EGD with placement of gastric clips, continues to  drop Hb, Dr Nils Pyle saw him on 06/23/14, received 1 unit of PRBC, no signs of fluid overload.  Hb 8.3 yesterday, today 8.9. He has developed LUE yesterday and venous Duplex showed an acute DVT noted in the left brachial and axillary veins. There is superficial thrombosis noted in the left basilic vein. He was started on Heparin drip. He reports some fresh blood in his stool, Hb 8.9, we will continue to monitor.  He has been off Neosynephrine since yesterday. BP stable.   Dorothy Spark 06/25/2014

## 2014-06-25 NOTE — Progress Notes (Addendum)
ANTICOAGULATION CONSULT NOTE - Initial Consult  Pharmacy Consult for Heparin Indication: DVT  No Known Allergies  Patient Measurements: Height: 5\' 8"  (172.7 cm) Weight: 213 lb 10 oz (96.9 kg) IBW/kg (Calculated) : 68.4 Heparin Dosing Weight: 90kg   Vital Signs: Temp: 98.1 F (36.7 C) (05/30 0747) Temp Source: Oral (05/30 0747) BP: 124/79 mmHg (05/30 0900) Pulse Rate: 85 (05/30 0800)  Labs:  Recent Labs  06/23/14 0400 06/24/14 0350 06/24/14 2330 06/25/14 0939  HGB 7.6* 8.3*  --  8.9*  HCT 22.7* 25.0*  --  27.6*  PLT 172 172  --  246  HEPARINUNFRC  --   --  0.18* 0.63  CREATININE 1.07 1.02  --   --     Estimated Creatinine Clearance: 79.3 mL/min (by C-G formula based on Cr of 1.02).   Medical History: Past Medical History  Diagnosis Date  . Chest pain   . Hypertension   . Aortic aneurysm 1993  . 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 Pneumonia   . Aortic valve replaced      At [facility]  . Hx of aortic aneurysm repair 06/14/2014   Assessment: 69yom with Hx AVR admitted with AV thrombus after stopping his warfarin 3 months ago - on his own.  While being treated with heparin and integrilin for the clot, he developed GIB - s/p EGD with epi injection and clipping to gastric cardia erosion with visible vessel.  Anticoagulation was held and PRBC and PPI given.  He now has developed LUE DVT.  Plan is to restart heparin drip at a low rate without a bolus and keep Heparin Level at low end of therapeutic goal.  CBC low stable after PRBC x7 last 5/28.   Heparin level today is SUPRAtherapeutic at 0.63.  Will require decrease in rate.  No reports of bleeding at this time.    Goal of Therapy:  Heparin level 0.3-0.5 units/ml Monitor platelets by anticoagulation protocol: Yes   Plan:  - Decrease heparin rate to 1150 units/hr - Heparin level in 8 hours - Daily heparin level and CBC - Monitor for signs and symptoms of bleeding  Hassie Bruce, Pharm.  D. Clinical Pharmacy Resident Pager: [phone] Ph: [phone] 06/25/2014 10:14 AM    Addendum: Heparin level is now therapeutic at 0.38. Continue heparin at 1150 units/hr and follow up AM labs.  Nena Jordan, PharmD, BCPS 06/25/2014, 6:50 PM

## 2014-06-25 NOTE — Progress Notes (Signed)
Physical Therapy Treatment Patient Details Name: [name] MRN: [id number] DOB: 08-15-1945 Today's Date: 06/25/2014    History of Present Illness 69 yo male admitted with aortic insufficiency; hx aortic valve replacement in the 1990's, splenectomy. He was tx to [facility] 5/19 from [place] with cardiogenic shock r/t severe aortic valve insufficiency/ partially thrombosed mechanical AoV after stopping coumadin for unclear reasons several months prior. scheduled for Aortic Valve REplacement, but surgery put on hold secondary to melena, endoscopy 5/26; Difficulty with respiratory recovery post, Critical Care on board; Cardiology would like for [name] to be ambulatory prior to Aortic Valve Replacement    PT Comments    Pt able to progress with mobility today although limited able to transfer to chair and take some steps. Pt encouraged to continue mobility with nursing as well as HEP to maximize function. Will continue to follow. Pt reports no pain at rest.  Follow Up Recommendations  SNF     Equipment Recommendations       Recommendations for Other Services       Precautions / Restrictions Precautions Precautions: Fall Restrictions Weight Bearing Restrictions: No    Mobility  Bed Mobility Overal bed mobility: Needs Assistance Bed Mobility: Supine to Sit     Supine to sit: Min assist     General bed mobility comments: cues for sequence and safety with assist to pivot legs to EOB, [name] rail and elevate trunk from surface, increased time to complete  Transfers Overall transfer level: Needs assistance   Transfers: Sit to/from Stand Sit to Stand: Mod assist         General transfer comment: cues for hand placement and sequence with slight elevation of bed  Ambulation/Gait Ambulation/Gait assistance: Min assist;+2 safety/equipment Ambulation Distance (Feet): 5 Feet Assistive device: Rolling walker (2 wheeled) Gait Pattern/deviations: Shuffle;Trunk flexed    Gait velocity interpretation: Below normal speed for age/gender General Gait Details: cues for posture, position in RW and to increase stride with chair pulled behind pt   Stairs            Wheelchair Mobility    Modified Rankin (Stroke Patients Only)       Balance Overall balance assessment: Needs assistance   Sitting balance-Leahy Scale: Fair       Standing balance-Leahy Scale: Poor                      Cognition Arousal/Alertness: Awake/alert Behavior During Therapy: Flat affect Overall Cognitive Status: Impaired/Different from baseline                 General Comments: slow to answer questions    Exercises General Exercises - Lower Extremity Long Arc Quad: AROM;Seated;Both;15 reps Hip Flexion/Marching: AROM;Seated;Both;15 reps    General Comments        Pertinent Vitals/Pain Pain Location: pt inconsistent stating legs don't really hurt just sore and weak but when asked to give a # he states 8 Pain Descriptors / Indicators: Aching Pain Intervention(s): Limited activity within patient's tolerance;Repositioned  sats 100% RA HR 90 BP 119/76    Home Living                      Prior Function            PT Goals (current goals can now be found in the care plan section) Progress towards PT goals: Progressing toward goals    Frequency       PT Plan Discharge plan  needs to be updated    Co-evaluation             End of Session Equipment Utilized During Treatment: Gait belt Activity Tolerance: Patient tolerated treatment well Patient left: in chair;with call bell/phone within [name]     Time: 5217-4715 PT Time Calculation (min) (ACUTE ONLY): 20 min  Charges:  $Therapeutic Activity: 8-22 mins                    G Codes:      [name] 2014-07-16, 10:12 AM [name], [place]

## 2014-06-25 NOTE — Progress Notes (Signed)
[place] for Heparin Indication: DVT  No Known Allergies  Patient Measurements: Height: 5\' 8"  (172.7 cm) Weight: 213 lb 3 oz (96.7 kg) IBW/kg (Calculated) : 68.4 Heparin Dosing Weight: 90kg   Vital Signs: Temp: 98 F (36.7 C) (05/29 2300) Temp Source: Oral (05/29 2300) BP: 99/68 mmHg (05/29 2300) Pulse Rate: 86 (05/29 2300)  Labs:  Recent Labs  06/22/14 0500 06/22/14 1756 06/23/14 0400 06/24/14 0350 06/24/14 2330  HGB 8.8* 8.2* 7.6* 8.3*  --   HCT 25.7* 24.3* 22.7* 25.0*  --   PLT 161 168 172 172  --   HEPARINUNFRC  --   --   --   --  0.18*  CREATININE 1.14  --  1.07 1.02  --     Estimated Creatinine Clearance: 79.2 mL/min (by C-G formula based on Cr of 1.02).  Assessment: 69 yo male with h/o AVR, GI bleed s/p EGD, now found to have LUE DVT, for heparin   Goal of Therapy:  Heparin level 0.3-0.5 units/ml Monitor platelets by anticoagulation protocol: Yes   Plan:  Increase Heparin 1250 units/hr Check heparin level in 8 hours.   Phillis Knack, PharmD, BCPS   06/25/2014 12:06 AM

## 2014-06-26 LAB — CBC
HCT: 25.8 % — ABNORMAL LOW (ref 39.0–52.0)
Hemoglobin: 8.2 g/dL — ABNORMAL LOW (ref 13.0–17.0)
MCH: 28.3 pg (ref 26.0–34.0)
MCHC: 31.8 g/dL (ref 30.0–36.0)
MCV: 89 fL (ref 78.0–100.0)
Platelets: 269 10*3/uL (ref 150–400)
RBC: 2.9 MIL/uL — ABNORMAL LOW (ref 4.22–5.81)
RDW: 18.4 % — ABNORMAL HIGH (ref 11.5–15.5)
WBC: 8.7 10*3/uL (ref 4.0–10.5)

## 2014-06-26 LAB — COMPREHENSIVE METABOLIC PANEL
ALT: 18 U/L (ref 17–63)
AST: 32 U/L (ref 15–41)
Albumin: 2.5 g/dL — ABNORMAL LOW (ref 3.5–5.0)
Alkaline Phosphatase: 49 U/L (ref 38–126)
Anion gap: 10 (ref 5–15)
BUN: 12 mg/dL (ref 6–20)
CO2: 22 mmol/L (ref 22–32)
Calcium: 8.2 mg/dL — ABNORMAL LOW (ref 8.9–10.3)
Chloride: 104 mmol/L (ref 101–111)
Creatinine, Ser: 0.99 mg/dL (ref 0.61–1.24)
GFR calc Af Amer: >60 mL/min (ref >60)
GFR calc non Af Amer: >60 mL/min (ref >60)
Glucose, Bld: 100 mg/dL — ABNORMAL HIGH (ref 65–99)
Potassium: 3.4 mmol/L — ABNORMAL LOW (ref 3.5–5.1)
Sodium: 136 mmol/L (ref 135–145)
Total Bilirubin: 1 mg/dL (ref 0.3–1.2)
Total Protein: 6.1 g/dL — ABNORMAL LOW (ref 6.5–8.1)

## 2014-06-26 LAB — HEPARIN LEVEL (UNFRACTIONATED): Heparin Unfractionated: 0.32 IU/mL (ref 0.30–0.70)

## 2014-06-26 MED ORDER — POTASSIUM CHLORIDE CRYS ER 10 MEQ PO TBCR
40.0000 meq | EXTENDED_RELEASE_TABLET | Freq: Once | ORAL | Status: AC
  Administered 2014-06-26: 40 meq via ORAL
  Filled 2014-06-26: qty 4

## 2014-06-26 MED ORDER — POTASSIUM CHLORIDE 20 MEQ PO PACK
40.0000 meq | PACK | Freq: Once | ORAL | Status: DC
  Filled 2014-06-26: qty 2

## 2014-06-26 NOTE — Progress Notes (Signed)
Error

## 2014-06-26 NOTE — Progress Notes (Signed)
Patient Name: [name] Date of Encounter: 06/26/2014   SUBJECTIVE No new complaints today, says he is doing fair, some pain in his left arm, but says this is better than before. Denies chest pain or SOB. LAst bowel movement- about 4am this morning, Black tarry stools noted, about 65mls, no blood.  OBJECTIVE  Filed Vitals:   06/26/14 0600 06/26/14 0700 06/26/14 0732 06/26/14 0800  BP: 112/71 102/70 102/70 112/73  Pulse:  90 96 126  Temp:   98.4 F (36.9 C)   TempSrc:   Oral   Resp: 17 27 18 18   Height:      Weight: 215 lb 6.2 oz (97.7 kg)     SpO2:  99% 100% 100%    Intake/Output Summary (Last 24 hours) at 06/26/14 0818 Last data filed at 06/26/14 0750  Gross per 24 hour  Intake  279.5 ml  Output    385 ml  Net -105.5 ml   Filed Weights   06/24/14 0346 06/25/14 0444 06/26/14 0600  Weight: 213 lb 3 oz (96.7 kg) 213 lb 10 oz (96.9 kg) 215 lb 6.2 oz (97.7 kg)   PHYSICAL EXAM  General: NAD, sitting in recliner at bedside eating breakfast, responds to question, not in any distress Neuro: Alert and oriented X 3. Moves all extremities spontaneously. HEENT:  Normal, AT, [place] Lungs:  CTA Heart: Irregular  3/6 systolic murmur all 4 areas on auscultation but loudest aortic area, Abdomen: Soft, full, non-tender, bowel sounds present Extremities: LUE edema still present, warm and well perfused, lower extremities without edema.  CBC  Recent Labs  06/25/14 0939 06/26/14 0425  WBC 8.7 8.7  HGB 8.9* 8.2*  HCT 27.6* 25.8*  MCV 88.7 89.0  PLT 246 347   Basic Metabolic Panel  Recent Labs  06/25/14 0939 06/26/14 0425  NA 132* 136  K 3.4* 3.4*  CL 103 104  CO2 22 22  GLUCOSE 137* 100*  BUN 11 12  CREATININE 1.04 0.99  CALCIUM 8.3* 8.2*   Radiology/Studies  Dg Chest Portable 1 View  06/21/2014   CLINICAL DATA:  Status post EGD for upper GI bleed, evaluate ETT  EXAM: PORTABLE CHEST - 1 VIEW  COMPARISON:  06/16/2014  FINDINGS: Endotracheal tube terminates 4 cm  above the carina.  Mild blunting of the left costophrenic angle. Mild left basilar atelectasis. Lungs otherwise clear. No pleural effusion or pneumothorax.  Cardiomegaly.  Right IJ venous catheter terminates at the cavoatrial junction.  IMPRESSION: Endotracheal tube terminates 4 cm above the carina.  Right IJ venous catheter terminates at the cavoatrial junction.  No pneumothorax.   Electronically Signed   By: Julian Hy [name]   On: 06/21/2014 16:25   Telemetry:  Irregular, without P waves, HR mostly 90s, occasioanlly 120s.  Echo- TEE- 5/20- EF- 42-59%, Systolic function was normal. The estimated ejection fraction was in the range of 55% to 60%. Wall motion was normal; there were no regional wall motion abnormalities. - Aortic valve: St. Jude Mechanical AVR is present. Only one of the leaflets can be visualized as moving and that movement is significantly reduced. The other leaflet is immobile. There is severe aortic insufficiency that is best appreciated in the 120 degree view. 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  06/19/14 cardiac catheterization  Prior ascending aortic dissection which prompted aortic valve replacement and bypass to the left main and RCA. The bypass appears widely  patent.  Significant false lumen still present. There is some difficulty in getting to the coronary vessels and a versa core wire had to be used for this.  There does not appear to be any significant coronary artery disease in the major epicardial vessels. It is somewhat difficult to see the distal RCA system.  Essentially normal right heart pressures. Hardy 8 output 5.9 L/m. Cardiac index 2.8. pulmonary artery saturation 54%. Plan for high risk aortic valve replacement. If further imaging is required, consider coronary CT to better visualize the anatomy.  06/24/2014 LUE venous Duplex Findings consistent with  acute deep vein thrombosis involving the axillary and brachial veins of left upper extremity. There is superficial thrombosis noted in the basilic vein.  Scheduled Meds: . antiseptic oral rinse  7 mL Mouth Rinse q12n4p  . bisacodyl  5 mg Oral Once  . pantoprazole (PROTONIX) IV  40 mg Intravenous Q12H  . sodium chloride  10-40 mL Intracatheter Q12H  . sodium chloride  3 mL Intravenous Q12H   Continuous Infusions: . sodium chloride 20 mL/hr at 06/21/14 1015  . heparin 1,150 Units/hr (06/25/14 1304)   PRN Meds:.[MAR Hold] sodium chloride, [MAR Hold] acetaminophen, fentaNYL (SUBLIMAZE) injection, [MAR Hold] ondansetron (ZOFRAN) IV, sodium chloride    ASSESSMENT AND PLAN: 69 year old male with h/o aortic root and aortic valve replacement at [facility] in 1993 with 25 mm St Jude valve conduit, who presented to [facility] 5/18 with acute CHF presumably sec to valve dysfunction.   1. 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  Appreciate CTS recommendations.  Needs aortic valve replacement but currently not a candidate for surgery until he is ambulatory with PT and his pressors weaned off.  Not on pressors. - Pt working with Pt, recs- SNF - addressing GI bleeding as below - Awaiting CVTS recs as to when surgery will be planned.  2. Cardiogenic Shock with diastolic CHF exacerbation- likely due to valve thrombosis.  BP stable. No signs of fluid overload.   3. Acute GI bleed with melena with gastric cardia erosions noted on EGD, 06/21/2014- GAstric erosions with ozing blood vessel, 4 gastric clips were used and epinephrine injected. S/p 7 units of blood since admission. Still having dark tarry stools- last bowel movement early this am, and fresh blood in stool noted yesterday. Hgb Stable at 8.2. - monitor CBC; transfuse for Hgb < 8; give IV lasix if transfusion needed - Cont BID PPI - Daily CBC  4. Chronic Atrial Fib:  Rate controlled,  anticoag initially held due to bleeding. Now on heparin infusion for DVT - Cont Heparin ggt  5. Left upper Extremity DVT- On heparin infusion, was not bolused. - Cont heparin ggt  6. Thrombocytopenia:  Resolved. Plt today- 269.  7.  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 The coronary reconstruction was with a 8 mm Gore-Tex graft Cabral type repair in 1993.  8. Hypokalemia- 3.4 today. - Replete with 7meq of KCL.  Bing Neighbors PGY-2, IMTS- Resident on CCU service. 9:23 AM, 06/26/2014  The patient was seen, examined and discussed with Bing Neighbors, MD and I agree with the above.   69 year old male with prosthetic valve dysfunction/thrombosis with severe AI, admitted in cardiogenic shock, scheduled for a surgery but cancelled sec to GIB - s/p EGD with placement of gastric clips, continues to drop Hb, Dr Nils Pyle saw him on  06/23/14, received 1 unit of PRBC, no signs of fluid overload.  Hb 8.3 -> 8.9 -> 8.2 today, minimal melena. He has developed LUE on 5/29 and venous Duplex showed an acute DVT noted in the left brachial and axillary veins. There is superficial thrombosis noted in the left basilic vein. He was started on Heparin drip. He has been off Neosynephrine since Saturday. BP stable, started to mobilize.  We will await CT surgery input.  Dorothy Spark 06/26/2014'

## 2014-06-26 NOTE — Progress Notes (Signed)
Physical Therapy Treatment Patient Details Name: [name] MRN: [id number] DOB: 09/02/1945 Today's Date: 06/26/2014    History of Present Illness 69 yo male admitted with aortic insufficiency; hx aortic valve replacement in the 1990's, splenectomy. He was tx to [facility] 5/19 from [place] with cardiogenic shock r/t severe aortic valve insufficiency/ partially thrombosed mechanical AoV after stopping coumadin for unclear reasons several months prior. scheduled for Aortic Valve REplacement, but surgery put on hold secondary to melena, endoscopy 5/26; Difficulty with respiratory recovery post, Critical Care on board; Cardiology would like for [name] to be ambulatory prior to Aortic Valve Replacement    PT Comments    Pt progressing with mobility but required encouragement to participate as he was stating "everythings wrong" "if its not one thing its another". Pt would not specifically state what was bothering him and with education for benefit of mobility and need to mobilize prior to AVR pt was willing to walk and participate. Will continue to follow.   Follow Up Recommendations  SNF     Equipment Recommendations  Rolling walker with 5" wheels;3in1 (PT)    Recommendations for Other Services       Precautions / Restrictions Precautions Precautions: Fall    Mobility  Bed Mobility               General bed mobility comments: in recliner before and after session  Transfers Overall transfer level: Needs assistance   Transfers: Sit to/from Stand Sit to Stand: Min assist         General transfer comment: cues for hand placement and sequence with assist for anterior translation  Ambulation/Gait Ambulation/Gait assistance: Min assist;+2 safety/equipment Ambulation Distance (Feet): 44 Feet Assistive device: Rolling walker (2 wheeled) Gait Pattern/deviations: Step-through pattern;Decreased stride length;Trunk flexed     General Gait Details: cues for posture,  position in RW and to increase stride with chair pulled behind pt   Stairs            Wheelchair Mobility    Modified Rankin (Stroke Patients Only)       Balance                                    Cognition Arousal/Alertness: Awake/alert Behavior During Therapy: Flat affect Overall Cognitive Status: Within Functional Limits for tasks assessed                 General Comments: slow to answer questions    Exercises General Exercises - Lower Extremity Long Arc Quad: AROM;Seated;Both;15 reps Hip ABduction/ADduction: AROM;Seated;Both;15 reps    General Comments        Pertinent Vitals/Pain Pain Assessment: No/denies pain  HR 102-122 sats 100% on RA    Home Living                      Prior Function            PT Goals (current goals can now be found in the care plan section) Progress towards PT goals: Progressing toward goals    Frequency       PT Plan Current plan remains appropriate    Co-evaluation             End of Session Equipment Utilized During Treatment: Gait belt Activity Tolerance: Patient tolerated treatment well Patient left: in chair;with call bell/phone within reach     Time: 5366-4403 PT Time Calculation (min) (  ACUTE ONLY): 25 min  Charges:  $Gait Training: 8-22 mins $Therapeutic Exercise: 8-22 mins                    G Codes:      [name] 07/21/2014, 10:46 AM [name], [place]

## 2014-06-26 NOTE — Progress Notes (Addendum)
Patient had 2 more watery black/bloody stools, in which some looks like fresh red blood in mixture. Approx 150 mL.   MD made aware.   Roxan Hockey, RN

## 2014-06-26 NOTE — Progress Notes (Signed)
Nutrition Follow-up  DOCUMENTATION CODES:  Obesity unspecified  INTERVENTION:  Boost Breeze BID  NUTRITION DIAGNOSIS:  Increased nutrient needs related to  (surgery) as evidenced by estimated needs.  ongoing  GOAL:  Patient will meet greater than or equal to 90% of their needs  unmet  MONITOR:  PO intake, Supplement acceptance, Diet advancement, Labs, Weight trends  REASON FOR ASSESSMENT:  Malnutrition Screening Tool    ASSESSMENT: Pt admitted with cardiogenic shock due to partially thrombosed mechanical AoV after stopping coumadin for unclear reasons several months ago. Pt has hx of duodenal ulcer s/p clipping. S/p cath 5/24, was scheduled for AVR 5/26 but developed recurrent melena and surgery put on hold. He was seen in consultation by GI and underwent endoscopy 5/26 with clipping of gastric erosion.  Pt finishing lunch at time of visit, consumed 60%, this has been his usual intake per nursing notes. Pt states his appetite is starting to come back. Pt states he is ok drinking [employer], but refused Ensure as it "does not agree" with him. Pt states he is able to order his own meals, encouraged pt to order items he likes to increase PO, provided menu to see all available options. Will continue to monitor. Labs reviewed: Hgb 8.2, K 3.4, Glu 100 - 137, Ca 8.2       Height:  Ht Readings from Last 1 Encounters:  06/21/14 5\' 8"  (1.727 m)    Weight:  Wt Readings from Last 1 Encounters:  06/26/14 215 lb 6.2 oz (97.7 kg)    Ideal Body Weight:  72.7 kg  Wt Readings from Last 10 Encounters:  06/26/14 215 lb 6.2 oz (97.7 kg)  12/27/12 223 lb (101.152 kg)  11/30/12 223 lb 12.8 oz (101.515 kg)  01/13/10 236 lb (107.049 kg)    BMI:  Body mass index is 32.76 kg/(m^2).  Estimated Nutritional Needs:  Kcal:  1900-2100  Protein:  95-110 grams  Fluid:  >/= 1.5 L/day  Skin:  Reviewed, no issues  Diet Order:  DIET SOFT Room service appropriate?: Yes; Fluid  consistency:: Thin  EDUCATION NEEDS:  No education needs identified at this time   Intake/Output Summary (Last 24 hours) at 06/26/14 1457 Last data filed at 06/26/14 1200  Gross per 24 hour  Intake    433 ml  Output    685 ml  Net   -252 ml    Last BM:  5/31 (black tarry)  Dai Apel A. [facility] Dietetic Intern Pager: [phone] 06/26/2014 2:57 PM

## 2014-06-26 NOTE — Progress Notes (Signed)
5 Days Post-Op  Subjective: Events of weekend noted, DVT left arm followed by heparin and bloody stool, on heparin and transfused  Objective: Vital signs in last 24 hours: Temp:  [98 F (36.7 C)-98.4 F (36.9 C)] 98.4 F (36.9 C) (05/31 0400) Pulse Rate:  [80-95] 90 (05/31 0700) Resp:  [17-30] 27 (05/31 0700) BP: (93-124)/(59-88) 102/70 mmHg (05/31 0700) SpO2:  [99 %-100 %] 99 % (05/31 0700) Weight:  [97.7 kg (215 lb 6.2 oz)] 97.7 kg (215 lb 6.2 oz) (05/31 0600) Last BM Date: 06/25/14 Soft diet, nothing PO recorded 1 stool Afebrile, BP 85-100's H/H is stable Intake/Output from previous day: 05/30 0701 - 05/31 0700 In: 279.5 [I.V.:279.5] Out: 360 [Urine:360] Intake/Output this shift:    General appearance: alert, cooperative and no distress Resp: clear to auscultation bilaterally GI: soft, non-tender; bowel sounds normal; no masses,  no organomegaly  Lab Results:   Recent Labs  06/25/14 0939 06/26/14 0425  WBC 8.7 8.7  HGB 8.9* 8.2*  HCT 27.6* 25.8*  PLT 246 269    BMET  Recent Labs  06/25/14 0939 06/26/14 0425  NA 132* 136  K 3.4* 3.4*  CL 103 104  CO2 22 22  GLUCOSE 137* 100*  BUN 11 12  CREATININE 1.04 0.99  CALCIUM 8.3* 8.2*   PT/INR No results for input(s): LABPROT, INR in the last 72 hours.   Recent Labs Lab 06/24/14 0350 06/25/14 0939 06/26/14 0425  AST 32 34 32  ALT 18 20 18   ALKPHOS 44 51 49  BILITOT 1.2 1.2 1.0  PROT 5.5* 6.3* 6.1*  ALBUMIN 2.3* 2.5* 2.5*     Lipase  No results found for: LIPASE   Studies/Results: No results found.  Medications: . antiseptic oral rinse  7 mL Mouth Rinse q12n4p  . bisacodyl  5 mg Oral Once  . pantoprazole (PROTONIX) IV  40 mg Intravenous Q12H  . sodium chloride  10-40 mL Intracatheter Q12H  . sodium chloride  3 mL Intravenous Q12H   . sodium chloride 20 mL/hr at 06/21/14 1015  . heparin 1,150 Units/hr (06/25/14 1304)    Assessment/Plan GI bleed S/p EGD with clipping 06/21/14 DR Ulice Dash  [name] Thrombosed Mechanical Aortic Valve, after stopping coumadin Acute respiratory failure after clipping of bleeder Cardiogenic shock DCHF Hx of type A thoracic aortic dissection Hyponatermia DVT found left arm 06/24/14, started on heparin/blood in stool following heparin/transfuse 06/26/14 DVT: No heparin for bleeding/SCD   Plan:  Currently stable, no surgical indication currently, will be available as needed.     LOS: 12 days    Sonora Catlin 06/26/2014

## 2014-06-26 NOTE — Progress Notes (Signed)
ANTICOAGULATION CONSULT NOTE - Initial Consult  Pharmacy Consult for Heparin Indication: DVT  No Known Allergies  Patient Measurements: Height: 5\' 8"  (172.7 cm) Weight: 215 lb 6.2 oz (97.7 kg) IBW/kg (Calculated) : 68.4 Heparin Dosing Weight: 90kg   Vital Signs: Temp: 98.4 F (36.9 C) (05/31 0732) Temp Source: Oral (05/31 0732) BP: 112/73 mmHg (05/31 0800) Pulse Rate: 126 (05/31 0800)  Labs:  Recent Labs  06/24/14 0350  06/25/14 0939 06/25/14 1730 06/26/14 0424 06/26/14 0425  HGB 8.3*  --  8.9*  --   --  8.2*  HCT 25.0*  --  27.6*  --   --  25.8*  PLT 172  --  246  --   --  269  HEPARINUNFRC  --   < > 0.63 0.38 0.32  --   CREATININE 1.02  --  1.04  --   --  0.99  < > = values in this interval not displayed.  Estimated Creatinine Clearance: 82 mL/min (by C-G formula based on Cr of 0.99).   Medical History: Past Medical History  Diagnosis Date  . Chest pain   . Hypertension   . Aortic aneurysm 1993  . 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 Pneumonia   . Aortic valve replaced      At [facility]  . Hx of aortic aneurysm repair 06/14/2014   Assessment: 69yom with Hx AVR admitted with AV thrombus after stopping his warfarin 3 months ago - on his own.  While being treated with heparin and integrilin for the clot, he developed GIB - s/p EGD with epi injection and clipping to gastric cardia erosion with visible vessel.  Anticoagulation was held and PRBC and PPI given.  He subsequently developed LUE DVT and pharmacy was consulted to dose heparin at a low rate without bolus and to maintain a low heparin level goal.   Overnight, nurse reports that patient had dark tarry stool ~ 0400 this morning of about 30 mLs.  AM HL remains therapeutic at 0.32 on 1150 units/hr.  Hgb has trended down slightly to 8.2, platelets remain stable.  Per surgery's note plan to transfuse today.  Patient is S/P 7 total units of blood on this admission.  Although heparin level has  trended down slightly from 0.38 to 0.32 over ~ 12 hours, will continue at current rate.  As patient continues to have bleeding from GI tract, want to aim for the lower end of therapeutic range which is 0.3.  Will follow-up AM level on 6/1.  Goal of Therapy:  Heparin level 0.3-0.5 units/ml Monitor platelets by anticoagulation protocol: Yes    Plan:  Continue heparin at 1150 units/hr Daily heparin level and CBC Monitor for signs and symptoms of bleeding  Theron Arista, PharmD Clinical Pharmacist - Resident Pager: [phone] 5/31/20168:33 AM

## 2014-06-27 DIAGNOSIS — I351 Nonrheumatic aortic (valve) insufficiency: Secondary | ICD-10-CM

## 2014-06-27 LAB — COMPREHENSIVE METABOLIC PANEL
ALT: 18 U/L (ref 17–63)
AST: 30 U/L (ref 15–41)
Albumin: 2.4 g/dL — ABNORMAL LOW (ref 3.5–5.0)
Alkaline Phosphatase: 47 U/L (ref 38–126)
Anion gap: 7 (ref 5–15)
BUN: 12 mg/dL (ref 6–20)
CO2: 23 mmol/L (ref 22–32)
Calcium: 8.3 mg/dL — ABNORMAL LOW (ref 8.9–10.3)
Chloride: 102 mmol/L (ref 101–111)
Creatinine, Ser: 1.01 mg/dL (ref 0.61–1.24)
GFR calc Af Amer: >60 mL/min (ref >60)
GFR calc non Af Amer: >60 mL/min (ref >60)
Glucose, Bld: 101 mg/dL — ABNORMAL HIGH (ref 65–99)
Potassium: 3.4 mmol/L — ABNORMAL LOW (ref 3.5–5.1)
Sodium: 132 mmol/L — ABNORMAL LOW (ref 135–145)
Total Bilirubin: 0.8 mg/dL (ref 0.3–1.2)
Total Protein: 6.1 g/dL — ABNORMAL LOW (ref 6.5–8.1)

## 2014-06-27 LAB — CBC
HCT: 24.9 % — ABNORMAL LOW (ref 39.0–52.0)
HCT: 25.6 % — ABNORMAL LOW (ref 39.0–52.0)
Hemoglobin: 8.1 g/dL — ABNORMAL LOW (ref 13.0–17.0)
Hemoglobin: 8.5 g/dL — ABNORMAL LOW (ref 13.0–17.0)
MCH: 29.1 pg (ref 26.0–34.0)
MCH: 29.5 pg (ref 26.0–34.0)
MCHC: 32.5 g/dL (ref 30.0–36.0)
MCHC: 33.2 g/dL (ref 30.0–36.0)
MCV: 89.6 fL (ref 78.0–100.0)
MCV: 88.9 fL (ref 78.0–100.0)
Platelets: 297 10*3/uL (ref 150–400)
Platelets: 319 10*3/uL (ref 150–400)
RBC: 2.78 MIL/uL — ABNORMAL LOW (ref 4.22–5.81)
RBC: 2.88 MIL/uL — ABNORMAL LOW (ref 4.22–5.81)
RDW: 18 % — ABNORMAL HIGH (ref 11.5–15.5)
RDW: 17.8 % — ABNORMAL HIGH (ref 11.5–15.5)
WBC: 8.4 10*3/uL (ref 4.0–10.5)
WBC: 8 10*3/uL (ref 4.0–10.5)

## 2014-06-27 LAB — TYPE AND SCREEN
ABO/RH(D): A POS
Antibody Screen: NEGATIVE
Unit division: 0
Unit division: 0

## 2014-06-27 LAB — CLOSTRIDIUM DIFFICILE BY PCR: Toxigenic C. Difficile by PCR: NEGATIVE

## 2014-06-27 LAB — HEPARIN LEVEL (UNFRACTIONATED): Heparin Unfractionated: 0.35 IU/mL (ref 0.30–0.70)

## 2014-06-27 MED ORDER — POLYETHYLENE GLYCOL 3350 17 G PO PACK
17.0000 g | PACK | ORAL | Status: AC
  Administered 2014-06-27 (×3): 17 g via ORAL
  Filled 2014-06-27 (×3): qty 1

## 2014-06-27 NOTE — Progress Notes (Signed)
Patient had very large BM, approx 400 cc. Loose, black/tarry, jelly/mucous like consistency. MD made aware. MD to touch bases with GI.    IV heparin still infusing for Left arm DVT.   Roxan Hockey, RN

## 2014-06-27 NOTE — Progress Notes (Signed)
Patient passed a total of 3 black tarry stools today.  MD aware. GI team rounded on patient. Appreciative of them coming by. Patient given 3 doses of Miralax this evening to try to clear stool. (Nursing Will document on stool characteristics when patient has BM).  Will place patient on a clear liquid diet, and then NPO after 8am on 06/28/14 incase MD does decide to proceed with EGD tomorrow 06/28/14.   Repeat labs in a.m  [place], [place] R

## 2014-06-27 NOTE — Progress Notes (Signed)
C-Diff is NEGATIVE. Will d/c contact precautions.   Roxan Hockey, RN

## 2014-06-27 NOTE — Progress Notes (Signed)
CARDIAC REHAB PHASE I   PRE:  Rate/Rhythm: 92 afib    BP: sitting 101/63    SaO2: 100 RA  MODE:  Ambulation: to BSC  POST:  Rate/Rhythm: 120 afib    BP: sitting 101/68     SaO2:   Attempted to work with pt however he is very weak and poorly motivated. Struggled to move his hips forward in seat and get his feet under his knees. First attempt to stand pt did not keep feet on ground and therefore had significant posterior lean and required max assist x2 with gait belt to stand all the way. Once standing, pt plopped back into recliner without warning. Sts his legs got weak. Pt stated he was not going to be able to walk and needed to use BSC. Pt was able to stand with assist x2 and directions to keep feet under his knees. Able to take 3-4 small steps toward [facility] and sit. Left pt on BSC. RN aware. Unfortunately pt is not appropriate for our services at this time. We can pick him back up when he is walking in the hall. Will f/u for PT progress. [place], [place], ACSM 06/27/2014 2:08 PM

## 2014-06-27 NOTE — Progress Notes (Signed)
[place] for Heparin Indication: DVT  No Known Allergies  Patient Measurements: Height: 5\' 8"  (172.7 cm) Weight: 218 lb 7.6 oz (99.1 kg) IBW/kg (Calculated) : 68.4 Heparin Dosing Weight: 90kg   Vital Signs: Temp: 98.9 F (37.2 C) (06/01 0724) Temp Source: Oral (06/01 0724) BP: 118/74 mmHg (06/01 0800) Pulse Rate: 84 (06/01 0800)  Labs:  Recent Labs  06/25/14 0939 06/25/14 1730 06/26/14 0424 06/26/14 0425 06/27/14 0515  HGB 8.9*  --   --  8.2* 8.1*  HCT 27.6*  --   --  25.8* 24.9*  PLT 246  --   --  269 297  HEPARINUNFRC 0.63 0.38 0.32  --  0.35  CREATININE 1.04  --   --  0.99 1.01    Estimated Creatinine Clearance: 81 mL/min (by C-G formula based on Cr of 1.01).   Medical History: Past Medical History  Diagnosis Date  . Chest pain   . Hypertension   . Aortic aneurysm 1993  . 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 Pneumonia   . Aortic valve replaced      At [facility]  . Hx of aortic aneurysm repair 06/14/2014   Assessment: 69yom with Hx AVR admitted with AV thrombus after stopping his warfarin 3 months ago - on his own.  While being treated with heparin and integrilin for the clot, he developed GIB - s/p EGD with epi injection and clipping to gastric cardia erosion with visible vessel.  Anticoagulation was held and PRBC and PPI given.  He subsequently developed LUE DVT and pharmacy was consulted to dose heparin at a low rate without bolus and to maintain a low heparin level goal.   -Heparin level= 0.35 and at goal. Hg/hct= 8.1/24.9 (comparable to 5/31) and plt= 297. Last bleeding episode (2 watery black/bloody stools) was 5/31 in the afternoon with none noted since then per RN.  Goal of Therapy:  Heparin level 0.3-0.5 units/ml Monitor platelets by anticoagulation protocol: Yes    Plan:  Continue heparin at 1150 units/hr Daily heparin level and CBC Monitor for signs and symptoms of bleeding  Hildred Laser, Pharm D 06/27/2014 9:14 AM

## 2014-06-27 NOTE — Progress Notes (Signed)
Patient Name: [name] Date of Encounter: 06/27/2014   SUBJECTIVE No new complaints today,he walked a little yesterday with no significant SOB, no chest pain.  Minimal blood in the stool yesterday.    OBJECTIVE  Filed Vitals:   06/27/14 0500 06/27/14 0600 06/27/14 0700 06/27/14 0724  BP: 110/83 98/67 108/81   Pulse: 91 85  93  Temp:    98.9 F (37.2 C)  TempSrc:    Oral  Resp: 17 17  19   Height:      Weight: 218 lb 7.6 oz (99.1 kg)     SpO2: 100% 100% 100%     Intake/Output Summary (Last 24 hours) at 06/27/14 0745 Last data filed at 06/27/14 0630  Gross per 24 hour  Intake  804.5 ml  Output   1195 ml  Net -390.5 ml   Filed Weights   06/25/14 0444 06/26/14 0600 06/27/14 0500  Weight: 213 lb 10 oz (96.9 kg) 215 lb 6.2 oz (97.7 kg) 218 lb 7.6 oz (99.1 kg)   PHYSICAL EXAM  General: NAD, sitting in recliner at bedside eating breakfast, responds to question, not in any distress Neuro: Alert and oriented X 3. Moves all extremities spontaneously. HEENT:  Normal, AT, [place] Lungs:  CTA Heart: Irregular  3/6 systolic murmur all 4 areas on auscultation but loudest aortic area, Abdomen: Soft, full, non-tender, bowel sounds present Extremities: LUE edema still present, warm and well perfused, lower extremities without edema.  CBC  Recent Labs  06/26/14 0425 06/27/14 0515  WBC 8.7 8.4  HGB 8.2* 8.1*  HCT 25.8* 24.9*  MCV 89.0 89.6  PLT 269 681   Basic Metabolic Panel  Recent Labs  06/26/14 0425 06/27/14 0515  NA 136 132*  K 3.4* 3.4*  CL 104 102  CO2 22 23  GLUCOSE 100* 101*  BUN 12 12  CREATININE 0.99 1.01  CALCIUM 8.2* 8.3*   Radiology/Studies  Dg Chest Portable 1 View  06/21/2014   CLINICAL DATA:  Status post EGD for upper GI bleed, evaluate ETT  EXAM: PORTABLE CHEST - 1 VIEW  COMPARISON:  06/16/2014  FINDINGS: Endotracheal tube terminates 4 cm above the carina.  Mild blunting of the left costophrenic angle. Mild left basilar atelectasis. Lungs  otherwise clear. No pleural effusion or pneumothorax.  Cardiomegaly.  Right IJ venous catheter terminates at the cavoatrial junction.  IMPRESSION: Endotracheal tube terminates 4 cm above the carina.  Right IJ venous catheter terminates at the cavoatrial junction.  No pneumothorax.   Electronically Signed   By: Julian Hy [name]   On: 06/21/2014 16:25   Telemetry:  Irregular, without P waves, HR mostly 90s, occasioanlly 120s.  Echo- TEE- 5/20- EF- 15-72%, Systolic function was normal. The estimated ejection fraction was in the range of 55% to 60%. Wall motion was normal; there were no regional wall motion abnormalities. - Aortic valve: St. Jude Mechanical AVR is present. Only one of the leaflets can be visualized as moving and that movement is significantly reduced. The other leaflet is immobile. There is severe aortic insufficiency that is best appreciated in the 120 degree view. 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  06/19/14 cardiac catheterization  Prior ascending aortic dissection which prompted aortic valve replacement and bypass to the left main and RCA. The bypass appears widely patent.  Significant false lumen still present. There is some difficulty in getting to the coronary vessels and a  versa core wire had to be used for this.  There does not appear to be any significant coronary artery disease in the major epicardial vessels. It is somewhat difficult to see the distal RCA system.  Essentially normal right heart pressures. Hardy 8 output 5.9 L/m. Cardiac index 2.8. pulmonary artery saturation 54%. Plan for high risk aortic valve replacement. If further imaging is required, consider coronary CT to better visualize the anatomy.  06/24/2014 LUE venous Duplex Findings consistent with acute deep vein thrombosis involving the axillary and brachial veins of left upper extremity. There  is superficial thrombosis noted in the basilic vein.  Scheduled Meds: . antiseptic oral rinse  7 mL Mouth Rinse q12n4p  . bisacodyl  5 mg Oral Once  . pantoprazole (PROTONIX) IV  40 mg Intravenous Q12H  . sodium chloride  10-40 mL Intracatheter Q12H  . sodium chloride  3 mL Intravenous Q12H   Continuous Infusions: . sodium chloride 20 mL/hr at 06/21/14 1015  . heparin 1,150 Units/hr (06/26/14 2000)   PRN Meds:.[MAR Hold] sodium chloride, [MAR Hold] acetaminophen, fentaNYL (SUBLIMAZE) injection, [MAR Hold] ondansetron (ZOFRAN) IV, sodium chloride    ASSESSMENT AND PLAN: 69 year old male with h/o aortic root and aortic valve replacement at [facility] in 1993 with 25 mm St Jude valve conduit, who presented to [facility] 5/18 with acute CHF presumably sec to valve dysfunction.   1. 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  Appreciate CTS recommendations.  Needs aortic valve replacement but currently not a candidate for surgery until he is ambulatory with PT and his pressors weaned off.  Not on pressors. - Pt working with Pt, recs- SNF - addressing GI bleeding as below - Awaiting CVTS recs as to when surgery will be planned.  2. Cardiogenic Shock with diastolic CHF exacerbation- likely due to valve thrombosis.  BP stable. No signs of fluid overload.   3. Acute GI bleed with melena with gastric cardia erosions noted on EGD, 06/21/2014- GAstric erosions with ozing blood vessel, 4 gastric clips were used and epinephrine injected. S/p 7 units of blood since admission. Still having dark tarry stools- last bowel movement early this am, and fresh blood in stool noted yesterday. Hgb Stable at 8.2. - monitor CBC; transfuse for Hgb < 8; give IV lasix if transfusion needed - Cont BID PPI - Daily CBC  4. Chronic Atrial Fib:  Rate controlled, anticoag initially held due to bleeding. Now on heparin infusion for DVT - Cont Heparin ggt  5.  Left upper Extremity DVT- On heparin infusion, was not bolused. - Cont heparin ggt  6. Thrombocytopenia:  Resolved. Plt today- 269.  7.  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 The coronary reconstruction was with a 8 mm Gore-Tex graft Cabral type repair in 1993.  8. Hypokalemia- 3.4 today. - Replete with 61meq of KCL.  69 year old male with prosthetic valve dysfunction/thrombosis with severe AI, admitted in cardiogenic shock, scheduled for a surgery but cancelled sec to GIB - s/p EGD with placement of gastric clips, continues to drop Hb, Dr Nils Pyle saw him on 06/23/14, received 1 unit of PRBC, no signs of fluid overload.  Hb 8.3 -> 8.9 -> 8.2 -> 8.1 today, minimal melena. He has developed LUE on 5/29 and venous Duplex showed an acute DVT noted in the left brachial and axillary veins. There is superficial thrombosis noted in  the left basilic vein. He was started on Heparin drip. He has been off Neosynephrine since Saturday. BP stable, started to mobilize.  We will await CT surgery input.  Dorothy Spark 06/27/2014'

## 2014-06-27 NOTE — Progress Notes (Signed)
Daily Rounding Note  06/27/2014, 1:29 PM  LOS: 13 days   SUBJECTIVE:       Passed black tarry, gelatinous mucoid stool this AM.  Pt placed on IV Heparin for left arm DVT 5/29/   CBC checked 2 hours post stool, it was stable at 8.4.   Stool yesterday was watery and black. C diff sent today and is cdiff negative.  Denies n/v, abdominal pain, chest pain, SOB.    OBJECTIVE:         Vital signs in last 24 hours:    Temp:  [98.2 F (36.8 C)-98.9 F (37.2 C)] 98.8 F (37.1 C) (06/01 1227) Pulse Rate:  [28-114] 90 (06/01 1117) Resp:  [15-25] 19 (06/01 1117) BP: (82-126)/(39-85) 97/66 mmHg (06/01 1117) SpO2:  [96 %-100 %] 100 % (06/01 1117) Weight:  [218 lb 7.6 oz (99.1 kg)] 218 lb 7.6 oz (99.1 kg) (06/01 0500) Last BM Date: 06/26/14 Filed Weights   06/25/14 0444 06/26/14 0600 06/27/14 0500  Weight: 213 lb 10 oz (96.9 kg) 215 lb 6.2 oz (97.7 kg) 218 lb 7.6 oz (99.1 kg)   General: looks better than last week still weak and chronically ill looking. Marland Kitchen     Heart: RRR Chest: clear bil.   Abdomen: soft, NT, ND.    Extremities: + right UE edema Neuro/Psych:  Oriented x 3.  Affect blunted.  Moves all 4s.  Observed a second black, tarry stool of moderate volume:  There are formed elements in the stool as well.   Intake/Output from previous day: 05/31 0701 - 06/01 0700 In: 816 [P.O.:540; I.V.:276] Out: 1195 [Urine:1195]  Intake/Output this shift: Total I/O In: 46 [I.V.:46] Out: -   Lab Results:  Recent Labs  06/26/14 0425 06/27/14 0515 06/27/14 1300  WBC 8.7 8.4 8.0  HGB 8.2* 8.1* 8.5*  HCT 25.8* 24.9* 25.6*  PLT 269 297 319   BMET  Recent Labs  06/25/14 0939 06/26/14 0425 06/27/14 0515  NA 132* 136 132*  K 3.4* 3.4* 3.4*  CL 103 104 102  CO2 22 22 23   GLUCOSE 137* 100* 101*  BUN 11 12 12   CREATININE 1.04 0.99 1.01  CALCIUM 8.3* 8.2* 8.3*   LFT  Recent Labs  06/25/14 0939 06/26/14 0425  06/27/14 0515  PROT 6.3* 6.1* 6.1*  ALBUMIN 2.5* 2.5* 2.4*  AST 34 32 30  ALT 20 18 18   ALKPHOS 51 49 47  BILITOT 1.2 1.0 0.8    Studies/Results: No results found.  ASSESMENT:   *   UGIB.  Tarry stool today with no change in hgb.  Suspect old blood.   EGD #1 5/23: gastric cardia erosions, mild duodenitis EGD #2 5/26: evacuation of large gastric blood clot. Cardia erosion with VV treated with epi injection and 4 hemoclips. Heparin gtt post EGD but then changed to IV BID.  AC held but heparin gtt started 5/29.   Had colonoscopy by Dr Barney Drain 12/2012: solitary cecal polyp (tubular adenoma), descending/sigmoid tics, external rrhoids, redundant colon.   *  UE DVT 5/29.  On Heparin drip as of 5/29/   * ABL anemia. Hgb is stable.   * Thrombosed mechanical AoV. Surgery for redo valve and redo aortic root replacement postponed due to recurrent GI bleeds etc. .     PLAN   * Leave the bid iv protonix in place.  Leave on heparin.  Give miralax to try to clear all old blood from Gi  tract as it is confusing/confounding as to whether he is passing old or fresh blood.  Leave soft diet in place.  CBC in AM.  EGD orders in depot in case we need to proceed with EGD # 3.  Will see pt in AM.        [name]  06/27/2014, 1:29 PM Pager: [phone] Attending MD note:   I have taken a history, examined the patient, and reviewed the chart. I suspect recurrent UGI bleed although the BUN and Hgb remains unchanged in past 24 hours. He is passing melenic stool, based on my exam today. I will make a decision in am about EGD depending on whether  His stool clears with Miralax.He had a linear erosion in the cardia on EGD last week by Dr Hilarie Fredrickson., which was injected with Epi and Endo clipped. He is on PPIs.. Would hold Heparin infusion for 5 hours  prior to EGD. Also may have to be intubated . [facility] Pager # [phone]

## 2014-06-27 NOTE — Progress Notes (Signed)
6 Days Post-Op Procedure(s) (LRB): ESOPHAGOGASTRODUODENOSCOPY (EGD) (N/A) Subjective: [name] has been stable the past 2 days w/o new problems- c diff screen pending  UGI bleeding stopped, DVT is better No sx of CHF, off neo Sharp ao valve closure on exam He cannot stand and walk- too weak   Objective: Vital signs in last 24 hours: Temp:  [97.6 F (36.4 C)-98.9 F (37.2 C)] 98.9 F (37.2 C) (06/01 0724) Pulse Rate:  [28-111] 84 (06/01 0800) Cardiac Rhythm:  [-] Atrial fibrillation (06/01 0800) Resp:  [15-39] 15 (06/01 0800) BP: (86-126)/(39-85) 118/74 mmHg (06/01 0800) SpO2:  [89 %-100 %] 100 % (06/01 0800) Weight:  [218 lb 7.6 oz (99.1 kg)] 218 lb 7.6 oz (99.1 kg) (06/01 0500)  Hemodynamic parameters for last 24 hours:  a fib  Intake/Output from previous day: 05/31 0701 - 06/01 0700 In: 816 [P.O.:540; I.V.:276] Out: 1195 [Urine:1195] Intake/Output this shift: Total I/O In: 11.5 [I.V.:11.5] Out: -     Lab Results:  Recent Labs  06/26/14 0425 06/27/14 0515  WBC 8.7 8.4  HGB 8.2* 8.1*  HCT 25.8* 24.9*  PLT 269 297   BMET:  Recent Labs  06/26/14 0425 06/27/14 0515  NA 136 132*  K 3.4* 3.4*  CL 104 102  CO2 22 23  GLUCOSE 100* 101*  BUN 12 12  CREATININE 0.99 1.01  CALCIUM 8.2* 8.3*    PT/INR: No results for input(s): LABPROT, INR in the last 72 hours. ABG    Component Value Date/Time   PHART 7.440 06/19/2014 0808   HCO3 22.3 06/19/2014 0823   TCO2 23 06/19/2014 0823   ACIDBASEDEF 2.0 06/19/2014 0823   O2SAT 59.3 06/20/2014 0500   CBG (last 3)  No results for input(s): GLUCAP in the last 72 hours.  Assessment/Plan: S/P Procedure(s) (LRB): ESOPHAGOGASTRODUODENOSCOPY (EGD) (N/A) Cont iv heparin Cont PT so he is strong enough to walk before redo aortic root replacement- may benefit from CIR to max out PT Will follow- plan d/w patient and family member  LOS: 13 days    [name] 06/27/2014

## 2014-06-28 ENCOUNTER — Encounter (HOSPITAL_COMMUNITY)
Admission: RE | Disposition: A | Discharge status: Rehabilitation Facility | Payer: Self-pay | Source: Other Acute Inpatient Hospital | Attending: Cardiology

## 2014-06-28 ENCOUNTER — Encounter (HOSPITAL_COMMUNITY): Payer: Self-pay | Admitting: Physician Assistant

## 2014-06-28 DIAGNOSIS — R5381 Other malaise: Secondary | ICD-10-CM

## 2014-06-28 LAB — HEPARIN LEVEL (UNFRACTIONATED): Heparin Unfractionated: 0.36 IU/mL (ref 0.30–0.70)

## 2014-06-28 LAB — COMPREHENSIVE METABOLIC PANEL
ALT: 18 U/L (ref 17–63)
AST: 26 U/L (ref 15–41)
Albumin: 2.5 g/dL — ABNORMAL LOW (ref 3.5–5.0)
Alkaline Phosphatase: 49 U/L (ref 38–126)
Anion gap: 9 (ref 5–15)
BUN: 11 mg/dL (ref 6–20)
CO2: 22 mmol/L (ref 22–32)
Calcium: 8.5 mg/dL — ABNORMAL LOW (ref 8.9–10.3)
Chloride: 102 mmol/L (ref 101–111)
Creatinine, Ser: 1.04 mg/dL (ref 0.61–1.24)
GFR calc Af Amer: >60 mL/min (ref >60)
GFR calc non Af Amer: >60 mL/min (ref >60)
Glucose, Bld: 95 mg/dL (ref 65–99)
Potassium: 3.8 mmol/L (ref 3.5–5.1)
Sodium: 133 mmol/L — ABNORMAL LOW (ref 135–145)
Total Bilirubin: 0.7 mg/dL (ref 0.3–1.2)
Total Protein: 6.1 g/dL — ABNORMAL LOW (ref 6.5–8.1)

## 2014-06-28 LAB — CBC
HCT: 24.8 % — ABNORMAL LOW (ref 39.0–52.0)
Hemoglobin: 8.1 g/dL — ABNORMAL LOW (ref 13.0–17.0)
MCH: 29.1 pg (ref 26.0–34.0)
MCHC: 32.7 g/dL (ref 30.0–36.0)
MCV: 89.2 fL (ref 78.0–100.0)
Platelets: 326 10*3/uL (ref 150–400)
RBC: 2.78 MIL/uL — ABNORMAL LOW (ref 4.22–5.81)
RDW: 17.5 % — ABNORMAL HIGH (ref 11.5–15.5)
WBC: 8.4 10*3/uL (ref 4.0–10.5)

## 2014-06-28 SURGERY — ESOPHAGOGASTRODUODENOSCOPY (EGD) WITH PROPOFOL
Anesthesia: Monitor Anesthesia Care

## 2014-06-28 NOTE — Consult Note (Signed)
Physical Medicine and Rehabilitation Consult Reason for Consult: Debilitation/multi-medical Referring Physician: Dr. Meda Coffee   HPI: [name] is a 69 y.o. right handed male with history of aortic valve repair in the setting of aortic aneurysm in the 1990s by Dr. [name] and recently stopped Coumadin secondary to wall no ulcer was significant bleeding treated at [place] in August 2014. Patient lives with his medically disabled wife and daughter used a cane prior to admission. Presented 06/14/2014 to [facility] complaining of shortness of breath with cardiogenic shock found to have severe aortic valve insufficiency and was transferred to [facility] for further evaluation. Placed on intravenous heparin. TEE completed showing ejection fraction of 60%. Only one of the leaflets could be visualized as moving movement was significantly reduced. There was severe aortic insufficiency. 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 Cardiac catheterization showed no significant CAD. Cardiothoracic surgery consulted for prosthetic valve dysfunction and need for surgical intervention. Hospital course complicated by GI bleed/melena with gastroenterology consulted 06/17/2014. EGD completed 06/18/2014 showing 2 gastric cardia erosions without active bleeding. Patient has been transfused during hospital course. On 06/21/2014 with increased loose tarry stools EGD repeated 06/21/2014 showing large gastric blood clot with erosion visible inferior to the GE junction. Course complicated 27/03/5007 after venous Doppler study showed acute DVT left brachial and axillary veins. Patient currently remains on heparin therapy. Currently the plan by cardiothoracic surgery is for ongoing physical therapy for strengthening conditioning and plan redo aortic root replacement in approximately 1-2 weeks.   Review of Systems  HENT:  Negative for hearing loss.   Eyes: Negative for double vision.  Respiratory: Positive for shortness of breath.   Cardiovascular: Positive for palpitations and leg swelling.  Gastrointestinal: Positive for blood in stool.  Genitourinary: Negative for dysuria.  Musculoskeletal: Positive for back pain. Negative for myalgias and neck pain.  Skin: Negative for rash.  Neurological: Positive for weakness. Negative for dizziness.  Psychiatric/Behavioral: Negative for suicidal ideas.   Past Medical History  Diagnosis Date  . Chest pain   . Hypertension   . Aortic aneurysm 1993  . 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 Pneumonia   . Aortic valve replaced      At [facility]. developed thrombosis of valve  . Hx of aortic aneurysm repair 06/14/2014  . Atrial fibrillation 05/2014  . Gastritis and gastroduodenitis with hemorrhage 05/2014    severe.   . Tubular adenoma of colon 12/2012  . DVT of upper extremity (deep vein thrombosis) 05/2014    left  . Acute blood loss anemia 05/2014    Past Surgical History  Procedure Laterality Date  . Abdominal aortic aneurysm repair  1993    At [facility], Unspecified  . Splenectomy    . Cardiac valve replacement      At [facility]  . Esophagogastroduodenoscopy  Aug 2014    [facility]: large duodenal ulcer with visible vessel, s/p clip and epi, erosive gastritis. +h.pylori serology, treated with amoxicillin and biaxin.   . Colonoscopy N/A 12/27/2012    Procedure: COLONOSCOPY;  Surgeon: Danie Binder, MD;  Location: [facility]O [facility];  Service: Endoscopy;  Laterality: N/A;  8:45-moved to 855   . Tee without cardioversion N/A 06/15/2014    Procedure: TRANSESOPHAGEAL ECHOCARDIOGRAM (TEE);  Surgeon: Sueanne Margarita, MD;  Location: [facility] ENDOSCOPY;  Service: Cardiovascular;  Laterality: N/A;  . Cardiac catheterization  N/A 06/15/2014    Procedure: Fluoroscopy Guidance;  Surgeon: Josue Hector, MD;  Location: [place] CV LAB;  Service: Cardiovascular;   Laterality: N/A;  . Esophagogastroduodenoscopy N/A 06/18/2014    Procedure: ESOPHAGOGASTRODUODENOSCOPY (EGD);  Surgeon: Jerene Bears, MD;  Location: [facility] ENDOSCOPY;  Service: Endoscopy;  Laterality: N/A;  . Peripheral vascular catheterization  06/19/2014    Procedure: Aortic Arch Angiography;  Surgeon: Jettie Booze, MD;  Location: [place] CV LAB;  Service: Cardiovascular;;  . Esophagogastroduodenoscopy N/A 06/21/2014    Procedure: ESOPHAGOGASTRODUODENOSCOPY (EGD);  Surgeon: Jerene Bears, MD;  Location: [facility] ENDOSCOPY;  Service: Endoscopy;  Laterality: N/A;  . Esophagogastroduodenoscopy N/A 06/21/2014    Procedure: ESOPHAGOGASTRODUODENOSCOPY (EGD);  Surgeon: Jerene Bears, MD;  Location: [place];  Service: Gastroenterology;  Laterality: N/A;   Family History  Problem Relation Age of Onset  . Hypertension Father 40  . Diabetes Mother 64  . Colon cancer Neg Hx    Social History:  reports that he has quit smoking. He does not have any smokeless tobacco history on file. He reports that he drinks alcohol. He reports that he does not use illicit drugs. Allergies: No Known Allergies Medications Prior to Admission  Medication Sig Dispense Refill  . ferrous sulfate 325 (65 FE) MG EC tablet Take 325 mg by mouth every other day.     . olmesartan-hydrochlorothiazide (BENICAR HCT) 20-12.5 MG per tablet Take 1 tablet by mouth 2 (two) times a week.       Home: Home Living Family/patient expects to be discharged to:: Private residence Living Arrangements: Children Available Help at Discharge: Family, Available PRN/intermittently (Need more info) Type of Home: House Home Access: Stairs to enter [employer] of Steps:  (to be determined) Home Layout: One level Home Equipment: Other (comment) (to be determined) Additional Comments: Pt very internally distracted by anticipation of pain with moving; Will need to very home information in later session  Functional History: Prior  Function Comments: Will need to get this information  Functional Status:  Mobility: Bed Mobility Overal bed mobility: Needs Assistance Bed Mobility: Supine to Sit Supine to sit: Min assist Sit to supine: Mod assist, +2 for safety/equipment General bed mobility comments: in recliner before and after session Transfers Overall transfer level: Needs assistance Transfers: Sit to/from Stand Sit to Stand: Min assist General transfer comment: cues for hand placement and sequence with assist for anterior translation Ambulation/Gait Ambulation/Gait assistance: Min assist, +2 safety/equipment Ambulation Distance (Feet): 44 Feet Assistive device: Rolling walker (2 wheeled) General Gait Details: cues for posture, position in RW and to increase stride with chair pulled behind pt Gait Pattern/deviations: Step-through pattern, Decreased stride length, Trunk flexed Gait velocity interpretation: Below normal speed for age/gender    ADL:    Cognition: Cognition Overall Cognitive Status: Within Functional Limits for tasks assessed Orientation Level: Oriented X4 Cognition Arousal/Alertness: Awake/alert Behavior During Therapy: Flat affect Overall Cognitive Status: Within Functional Limits for tasks assessed Area of Impairment: Attention General Comments: slow to answer questions  Blood pressure 138/84, pulse 84, temperature 98.5 F (36.9 C), temperature source Oral, resp. rate 17, height 5\' 8"  (1.727 m), weight 98.3 kg (216 lb 11.4 oz), SpO2 100 %. Physical Exam  HENT:  Head: Normocephalic.  Eyes: EOM are normal.  Neck: Normal range of motion. Neck supple. No thyromegaly present.  Cardiovascular:  Cardiac rate controlled  Respiratory: Effort normal and breath sounds normal. No respiratory distress.  GI: Soft. Bowel sounds are normal. He exhibits no distension.  Neurological: He is alert.  Mood is flat but appropriate. He is oriented to person place and date of birth. Follow simple  commands    Results for orders placed or performed during the hospital encounter of 06/14/14 (from the past 24 hour(s))  Clostridium Difficile by PCR     Status: None   Collection Time: 06/27/14 10:25 AM  Result Value Ref Range   C difficile by pcr NEGATIVE NEGATIVE  CBC     Status: Abnormal   Collection Time: 06/27/14  1:00 PM  Result Value Ref Range   WBC 8.0 4.0 - 10.5 K/uL   RBC 2.88 (L) 4.22 - 5.81 MIL/uL   Hemoglobin 8.5 (L) 13.0 - 17.0 g/dL   HCT 25.6 (L) 39.0 - 52.0 %   MCV 88.9 78.0 - 100.0 fL   MCH 29.5 26.0 - 34.0 pg   MCHC 33.2 30.0 - 36.0 g/dL   RDW 17.8 (H) 11.5 - 15.5 %   Platelets 319 150 - 400 K/uL  CBC     Status: Abnormal   Collection Time: 06/28/14  5:00 AM  Result Value Ref Range   WBC 8.4 4.0 - 10.5 K/uL   RBC 2.78 (L) 4.22 - 5.81 MIL/uL   Hemoglobin 8.1 (L) 13.0 - 17.0 g/dL   HCT 24.8 (L) 39.0 - 52.0 %   MCV 89.2 78.0 - 100.0 fL   MCH 29.1 26.0 - 34.0 pg   MCHC 32.7 30.0 - 36.0 g/dL   RDW 17.5 (H) 11.5 - 15.5 %   Platelets 326 150 - 400 K/uL  Comprehensive metabolic panel     Status: Abnormal   Collection Time: 06/28/14  5:00 AM  Result Value Ref Range   Sodium 133 (L) 135 - 145 mmol/L   Potassium 3.8 3.5 - 5.1 mmol/L   Chloride 102 101 - 111 mmol/L   CO2 22 22 - 32 mmol/L   Glucose, Bld 95 65 - 99 mg/dL   BUN 11 6 - 20 mg/dL   Creatinine, Ser 1.04 0.61 - 1.24 mg/dL   Calcium 8.5 (L) 8.9 - 10.3 mg/dL   Total Protein 6.1 (L) 6.5 - 8.1 g/dL   Albumin 2.5 (L) 3.5 - 5.0 g/dL   AST 26 15 - 41 U/L   ALT 18 17 - 63 U/L   Alkaline Phosphatase 49 38 - 126 U/L   Total Bilirubin 0.7 0.3 - 1.2 mg/dL   GFR calc non Af Amer >60 >60 mL/min   GFR calc Af Amer >60 >60 mL/min   Anion gap 9 5 - 15  Heparin level (unfractionated)     Status: None   Collection Time: 06/28/14  5:00 AM  Result Value Ref Range   Heparin Unfractionated 0.36 0.30 - 0.70 IU/mL   No results found.  Assessment/Plan: Diagnosis: debility due to AVI, multiple medical 1. Does the  need for close, 24 hr/day medical supervision in concert with the patient's rehab needs make it unreasonable for this patient to be served in a less intensive setting? Yes 2. Co-Morbidities requiring supervision/potential complications: CHF, afib, htn, GIB 3. Due to bladder management, bowel management, safety, skin/wound care, disease management, medication administration, pain management and patient education, does the patient require 24 hr/day rehab nursing? Yes 4. Does the patient require coordinated care of a physician, rehab nurse, PT (1-2 hrs/day, 5 days/week) and OT (1-2 hrs/day, 5 days/week) to address physical and functional deficits in the context of the above medical diagnosis(es)? Yes Addressing deficits in the following areas:  balance, endurance, locomotion, strength, transferring, bowel/bladder control, bathing, dressing, feeding, grooming, toileting and psychosocial support 5. Can the patient actively participate in an intensive therapy program of at least 3 hrs of therapy per day at least 5 days per week? Yes 6. The potential for patient to make measurable gains while on inpatient rehab is good 7. Anticipated functional outcomes upon discharge from inpatient rehab are supervision  with PT, supervision with OT, n/a with SLP. 8. Estimated rehab length of stay to reach the above functional goals is: 9-14 days 9. Does the patient have adequate social supports and living environment to accommodate these discharge functional goals? Yes 10. Anticipated D/C setting: Home 11. Anticipated post D/C treatments: HH therapy and Outpatient therapy after discharge 12. Overall Rehab/Functional Prognosis: excellent  RECOMMENDATIONS: This patient's condition is appropriate for continued rehabilitative care in the following setting: CIR Patient has agreed to participate in recommended program. Yes Note that insurance prior authorization may be required for reimbursement for recommended  care.  Comment: Rehab Admissions Coordinator to follow up.  Thanks,  Meredith Staggers, MD, Mellody Drown     06/28/2014

## 2014-06-28 NOTE — Progress Notes (Signed)
[place] for Heparin Indication: DVT  No Known Allergies  Patient Measurements: Height: 5\' 8"  (172.7 cm) Weight: 216 lb 11.4 oz (98.3 kg) IBW/kg (Calculated) : 68.4 Heparin Dosing Weight: 90kg   Vital Signs: Temp: 98.5 F (36.9 C) (06/02 0800) Temp Source: Oral (06/02 0800) BP: 138/84 mmHg (06/02 0800) Pulse Rate: 84 (06/02 0800)  Labs:  Recent Labs  06/26/14 0424 06/26/14 0425 06/27/14 0515 06/27/14 1300 06/28/14 0500  HGB  --  8.2* 8.1* 8.5* 8.1*  HCT  --  25.8* 24.9* 25.6* 24.8*  PLT  --  269 297 319 326  HEPARINUNFRC 0.32  --  0.35  --  0.36  CREATININE  --  0.99 1.01  --  1.04    Estimated Creatinine Clearance: 78.4 mL/min (by C-G formula based on Cr of 1.04).   Medical History: Past Medical History  Diagnosis Date  . Chest pain   . Hypertension   . Aortic aneurysm 1993  . 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 Pneumonia   . Aortic valve replaced      At [facility]. developed thrombosis of valve  . Hx of aortic aneurysm repair 06/14/2014  . Atrial fibrillation 05/2014  . Gastritis and gastroduodenitis with hemorrhage 05/2014    severe.   . Tubular adenoma of colon 12/2012  . DVT of upper extremity (deep vein thrombosis) 05/2014    left  . Acute blood loss anemia 05/2014    Assessment: 69yom with Hx AVR admitted with AV thrombus after stopping his warfarin 3 months ago - on his own.  While being treated with heparin and integrilin for the clot, he developed GIB - s/p EGD with epi injection and clipping to gastric cardia erosion with visible vessel.  Anticoagulation was held and PRBC and PPI given.  He subsequently developed LUE DVT and pharmacy was consulted to dose heparin at a low rate without bolus and to maintain a low heparin level goal.   -Heparin level= 0.36 and at goal. Hg/hct= 8.1/24.8 (comparable to 6/1) and plt= 326. Per chart notes- Patient passed a total of 3 black tarry stools 6/1 (no BMs  today). GI following- no plans for EGD now. Continuing heparin with stable Hg.   Goal of Therapy:  Heparin level 0.3-0.5 units/ml Monitor platelets by anticoagulation protocol: Yes    Plan:  Continue heparin at 1150 units/hr Daily heparin level and CBC Monitor for signs and symptoms of bleeding  Hildred Laser, Pharm D 06/28/2014 9:28 AM

## 2014-06-28 NOTE — Progress Notes (Signed)
Discussed with RN CM. We will ask inpt rehab MD for consult to request an inpt rehab stay to prepare for ultimate surgery in 1 to 2 weeks. If rehab MD agrees to possible admission, Dr. Reynaldo Minium would work with insurance to try to obtain insurance approval if rehab MD feels appropriate. I will follow up on this case one rehab MD assesses pt. Please call me with any questions. [phone]

## 2014-06-28 NOTE — Progress Notes (Signed)
Patient has been assessed by CIR and there is an effort to see if [facility] will authorize CIR short term. Patient is anticipated to require surgery in the near future.  CSW services will provide support and monitor for possible need for SNF level of care in the future.  [name]. [name], [place]

## 2014-06-28 NOTE — Progress Notes (Signed)
          Daily Rounding Note  06/28/2014, 8:01 AM  LOS: 14 days   SUBJECTIVE:     Despite 3 doses of Miralax, no BMs since yesterday 6/1 afternoon. Some flatus.  Denies nausea, abdominal pain.  Requiring 2 person assist to get onto [facility] chair.    OBJECTIVE:         Vital signs in last 24 hours:    Temp:  [98.4 F (36.9 C)-98.8 F (37.1 C)] 98.5 F (36.9 C) (06/02 0400) Pulse Rate:  [75-114] 81 (06/02 0600) Resp:  [16-36] 16 (06/02 0600) BP: (82-139)/(60-94) 110/80 mmHg (06/02 0600) SpO2:  [90 %-100 %] 100 % (06/02 0600) Weight:  [216 lb 11.4 oz (98.3 kg)] 216 lb 11.4 oz (98.3 kg) (06/02 0500) Last BM Date: 06/27/14 Filed Weights   06/26/14 0600 06/27/14 0500 06/28/14 0500  Weight: 215 lb 6.2 oz (97.7 kg) 218 lb 7.6 oz (99.1 kg) 216 lb 11.4 oz (98.3 kg)   General: frail, unwell, chronically ill looking.  Comfortable/NAD   Heart: Irreg/irreg.  Rate controlled.  Valve snap Chest: clear in front.  Cough with audible wet sounds Abdomen: obese, quiet, soft, NT.   Extremities: no CCE Neuro/Psych:  Psychomotor slowing but oriented x 3 and alert.  Moves all 4s.   Intake/Output from previous day: 06/01 0701 - 06/02 0700 In: 614.5 [P.O.:350; I.V.:264.5] Out: 1750 [Urine:1750]  Intake/Output this shift:    Lab Results:  Recent Labs  06/27/14 0515 06/27/14 1300 06/28/14 0500  WBC 8.4 8.0 8.4  HGB 8.1* 8.5* 8.1*  HCT 24.9* 25.6* 24.8*  PLT 297 319 326   BMET  Recent Labs  06/26/14 0425 06/27/14 0515 06/28/14 0500  NA 136 132* 133*  K 3.4* 3.4* 3.8  CL 104 102 102  CO2 22 23 22   GLUCOSE 100* 101* 95  BUN 12 12 11   CREATININE 0.99 1.01 1.04  CALCIUM 8.2* 8.3* 8.5*   LFT  Recent Labs  06/26/14 0425 06/27/14 0515 06/28/14 0500  PROT 6.1* 6.1* 6.1*  ALBUMIN 2.5* 2.4* 2.5*  AST 32 30 26  ALT 18 18 18   ALKPHOS 49 47 49  BILITOT 1.0 0.8 0.7    Studies/Results: No results found.  ASSESMENT:   *  UGIB. Tarry stools. Normal BUN (elevated previously in setting of acute UGIB).  EGD #1 5/23: gastric cardia erosions, mild duodenitis EGD #2 5/26: evacuation of large gastric blood clot. Cardia erosion with VV treated with epi injection and 4 hemoclips. Heparin gtt post EGD but then changed to IV BID.  AC held but heparin gtt started 5/29.  Had colonoscopy by Dr Barney Drain 12/2012: solitary cecal polyp (tubular adenoma), descending/sigmoid tics, external rrhoids, redundant colon.   * UE DVT 5/29. On Heparin drip as of 5/29.   * ABL anemia. Hgb remains stable.   * Thrombosed mechanical AoV. Surgery for redo valve and redo aortic root replacement postponed due to recurrent GI bleeds etc.   *  Debilitation.      PLAN   *  No EGD.  Restart soft diet.  Daily AM CBC.  Continue BID IV Protonix.     [name]  06/28/2014, 8:01 AM Pager: [phone]  Attending MD note:   I have taken a history, , and reviewed the chart. I agree with the Advanced Practitioner's impression and recommendations.   Melburn Popper [facility] Pager # [phone]

## 2014-06-28 NOTE — Progress Notes (Signed)
Nutrition Follow-up  DOCUMENTATION CODES:  Obesity unspecified  INTERVENTION:  Boost Breeze  NUTRITION DIAGNOSIS:  Increased nutrient needs related to  (surgery) as evidenced by estimated needs.  ongoing  GOAL:  Patient will meet greater than or equal to 90% of their needs  unmet  MONITOR:  PO intake, Supplement acceptance, Diet advancement, Labs, Weight trends  REASON FOR ASSESSMENT:  Malnutrition Screening Tool    ASSESSMENT: Pt admitted with cardiogenic shock due to partially thrombosed mechanical AoV after stopping coumadin for unclear reasons several months ago. Pt has hx of duodenal ulcer s/p clipping. S/p cath 5/24, was scheduled for AVR 5/26 but developed recurrent melena and surgery put on hold. He was seen in consultation by GI and underwent endoscopy 5/26 with clipping of gastric erosion  Pt finishing lunch at time of visit, ate 60%, states he had a late breakfast due to test and was just not that hungry. Per RN he has been eating pretty good and drinks the [employer]. Does not want Ensure due to it causing GI upset. Pt refuses snacks stating he can get crackers and other things he likes on the floor. Emphasized importance of consuming protein for healing, encouraged ordering foods he enjoys for better PO. Will continue to monitor. Labs reviewed: Hgb 8.1, Na 133, Ca 8.5  Height:  Ht Readings from Last 1 Encounters:  06/21/14 5\' 8"  (1.727 m)    Weight:  Wt Readings from Last 1 Encounters:  06/28/14 216 lb 11.4 oz (98.3 kg)    Ideal Body Weight:  72.7 kg  Wt Readings from Last 10 Encounters:  06/28/14 216 lb 11.4 oz (98.3 kg)  12/27/12 223 lb (101.152 kg)  11/30/12 223 lb 12.8 oz (101.515 kg)  01/13/10 236 lb (107.049 kg)    BMI:  Body mass index is 32.96 kg/(m^2).  Estimated Nutritional Needs:  Kcal:  1900-2100  Protein:  95-110 grams  Fluid:  >/= 1.5 L/day  Skin:  Reviewed, no issues  Diet Order:  DIET SOFT Room service  appropriate?: Yes; Fluid consistency:: Thin  EDUCATION NEEDS:  No education needs identified at this time   Intake/Output Summary (Last 24 hours) at 06/28/14 1434 Last data filed at 06/28/14 0900  Gross per 24 hour  Intake  338.5 ml  Output   1500 ml  Net -1161.5 ml    Last BM:  6/02  [name] Dietetic Intern Pager: [phone] 06/28/2014 2:43 PM

## 2014-06-28 NOTE — Progress Notes (Signed)
I met with [name] at bedside and then contacted his daughter, [name], by phone with his permission. [name] has had a gradual decline in function over the past 6 months. Used a cane and was ambulatory, but fatigued easily. Sponge bathes self due to fatigue. Lives with his daughter, [name], and his bedridden wife and son [name]. I will begin [facility] insurance approval for a possible inpt rehab admission prior to cardiac surgery if approved and MDs feel [name] medically ok to d/c to inpt rehab in preparation for redo aortic root replacement surgery with Dr. Prescott Gum. [phone]

## 2014-06-28 NOTE — Progress Notes (Signed)
Patient Name: [name] Date of Encounter: 06/28/2014   SUBJECTIVE No new complaints today,he continues to walk with PT, feels weak, no significant SOB, no chest pain.    OBJECTIVE  Filed Vitals:   06/28/14 0500 06/28/14 0600 06/28/14 0700 06/28/14 0800  BP: 139/94 110/80 100/70 138/84  Pulse: 90 81 78 84  Temp:    98.5 F (36.9 C)  TempSrc:    Oral  Resp: 19 16 15 17   Height:      Weight: 216 lb 11.4 oz (98.3 kg)     SpO2: 100% 100% 100% 100%    Intake/Output Summary (Last 24 hours) at 06/28/14 1104 Last data filed at 06/28/14 0900  Gross per 24 hour  Intake    723 ml  Output   1900 ml  Net  -1177 ml   Filed Weights   06/26/14 0600 06/27/14 0500 06/28/14 0500  Weight: 215 lb 6.2 oz (97.7 kg) 218 lb 7.6 oz (99.1 kg) 216 lb 11.4 oz (98.3 kg)   PHYSICAL EXAM  General: NAD, sitting in recliner at bedside eating breakfast, responds to question, not in any distress Neuro: Alert and oriented X 3. Moves all extremities spontaneously. HEENT:  Normal, AT, [place] Lungs:  CTA Heart: Irregular  3/6 systolic murmur all 4 areas on auscultation but loudest aortic area, Abdomen: Soft, full, non-tender, bowel sounds present Extremities: LUE edema still present, warm and well perfused, lower extremities without edema.  CBC  Recent Labs  06/27/14 1300 06/28/14 0500  WBC 8.0 8.4  HGB 8.5* 8.1*  HCT 25.6* 24.8*  MCV 88.9 89.2  PLT 319 785   Basic Metabolic Panel  Recent Labs  06/27/14 0515 06/28/14 0500  NA 132* 133*  K 3.4* 3.8  CL 102 102  CO2 23 22  GLUCOSE 101* 95  BUN 12 11  CREATININE 1.01 1.04  CALCIUM 8.3* 8.5*   Radiology/Studies  Dg Chest Portable 1 View  06/21/2014   CLINICAL DATA:  Status post EGD for upper GI bleed, evaluate ETT  EXAM: PORTABLE CHEST - 1 VIEW  COMPARISON:  06/16/2014  FINDINGS: Endotracheal tube terminates 4 cm above the carina.  Mild blunting of the left costophrenic angle. Mild left basilar atelectasis. Lungs otherwise clear. No  pleural effusion or pneumothorax.  Cardiomegaly.  Right IJ venous catheter terminates at the cavoatrial junction.  IMPRESSION: Endotracheal tube terminates 4 cm above the carina.  Right IJ venous catheter terminates at the cavoatrial junction.  No pneumothorax.   Electronically Signed   By: Julian Hy [name]   On: 06/21/2014 16:25   Telemetry:  Irregular, without P waves, HR mostly 90s, occasioanlly 120s.  Echo- TEE- 5/20- EF- 88-50%, Systolic function was normal. The estimated ejection fraction was in the range of 55% to 60%. Wall motion was normal; there were no regional wall motion abnormalities. - Aortic valve: St. Jude Mechanical AVR is present. Only one of the leaflets can be visualized as moving and that movement is significantly reduced. The other leaflet is immobile. There is severe aortic insufficiency that is best appreciated in the 120 degree view. 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  06/19/14 cardiac catheterization  Prior ascending aortic dissection which prompted aortic valve replacement and bypass to the left main and RCA. The bypass appears widely patent.  Significant false lumen still present. There is some difficulty in getting to the coronary vessels and a versa core  wire had to be used for this.  There does not appear to be any significant coronary artery disease in the major epicardial vessels. It is somewhat difficult to see the distal RCA system.  Essentially normal right heart pressures. Hardy 8 output 5.9 L/m. Cardiac index 2.8. pulmonary artery saturation 54%. Plan for high risk aortic valve replacement. If further imaging is required, consider coronary CT to better visualize the anatomy.  06/24/2014 LUE venous Duplex Findings consistent with acute deep vein thrombosis involving the axillary and brachial veins of left upper extremity. There is superficial  thrombosis noted in the basilic vein.  Scheduled Meds: . antiseptic oral rinse  7 mL Mouth Rinse q12n4p  . bisacodyl  5 mg Oral Once  . pantoprazole (PROTONIX) IV  40 mg Intravenous Q12H  . sodium chloride  10-40 mL Intracatheter Q12H   Continuous Infusions: . heparin 1,150 Units/hr (06/27/14 1900)   PRN Meds:.[MAR Hold] acetaminophen, fentaNYL (SUBLIMAZE) injection, [MAR Hold] ondansetron (ZOFRAN) IV, sodium chloride    ASSESSMENT AND PLAN: 69 year old male with h/o aortic root and aortic valve replacement at [facility] in 1993 with 25 mm St Jude valve conduit, who presented to [facility] 5/18 with acute CHF presumably sec to valve dysfunction.   1. 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  Appreciate CTS recommendations.  Needs aortic valve replacement but currently not a candidate for surgery until he is ambulatory with PT and his pressors weaned off.  Not on pressors. - Pt working with Pt, recs- SNF - addressing GI bleeding as below - Awaiting CVTS recs as to when surgery will be planned.  2. Cardiogenic Shock with diastolic CHF exacerbation- likely due to valve thrombosis.  BP stable. No signs of fluid overload.   3. Acute GI bleed with melena with gastric cardia erosions noted on EGD, 06/21/2014- GAstric erosions with ozing blood vessel, 4 gastric clips were used and epinephrine injected. S/p 7 units of blood since admission. Still having dark tarry stools- last bowel movement early this am, and fresh blood in stool noted yesterday. Hgb Stable at 8.2. - monitor CBC; transfuse for Hgb < 8; give IV lasix if transfusion needed - Cont BID PPI - Daily CBC  4. Chronic Atrial Fib:  Rate controlled, anticoag initially held due to bleeding. Now on heparin infusion for DVT - Cont Heparin ggt  5. Left upper Extremity DVT- On heparin infusion, was not bolused. - Cont heparin ggt  6. Thrombocytopenia:  Resolved. Plt today-  269.  7.  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 The coronary reconstruction was with a 8 mm Gore-Tex graft Cabral type repair in 1993.  8. Hypokalemia- 3.4 today. - Replete with 74meq of KCL.  69 year old male with prosthetic valve dysfunction/thrombosis with severe AI, admitted in cardiogenic shock, scheduled for a surgery but cancelled sec to GIB - s/p EGD with placement of gastric clips, now stable Hb and no blood in the stool despite heparin iv. DVT of the LUE has improved. He is euvolemic. He continues to work with PT to gain strenght in order to be bale to undergo surgery.   Dorothy Spark 06/28/2014'

## 2014-06-29 ENCOUNTER — Encounter (HOSPITAL_COMMUNITY): Payer: Self-pay | Admitting: Physician Assistant

## 2014-06-29 ENCOUNTER — Inpatient Hospital Stay (HOSPITAL_COMMUNITY)
Admission: AD | Admit: 2014-06-29 | Discharge: 2014-07-05 | DRG: 940 | Disposition: A | Discharge status: Other Acute Inpatient Hospital | Payer: Commercial Managed Care - HMO | Source: Intra-hospital | Attending: Physical Medicine & Rehabilitation | Admitting: Physical Medicine & Rehabilitation

## 2014-06-29 DIAGNOSIS — K921 Melena: Secondary | ICD-10-CM | POA: Diagnosis present

## 2014-06-29 DIAGNOSIS — M549 Dorsalgia, unspecified: Secondary | ICD-10-CM | POA: Diagnosis present

## 2014-06-29 DIAGNOSIS — G7281 Critical illness myopathy: Secondary | ICD-10-CM | POA: Diagnosis present

## 2014-06-29 DIAGNOSIS — I4891 Unspecified atrial fibrillation: Secondary | ICD-10-CM | POA: Diagnosis present

## 2014-06-29 DIAGNOSIS — R5381 Other malaise: Secondary | ICD-10-CM | POA: Diagnosis not present

## 2014-06-29 DIAGNOSIS — M25569 Pain in unspecified knee: Secondary | ICD-10-CM

## 2014-06-29 DIAGNOSIS — I480 Paroxysmal atrial fibrillation: Secondary | ICD-10-CM | POA: Diagnosis not present

## 2014-06-29 DIAGNOSIS — Z952 Presence of prosthetic heart valve: Secondary | ICD-10-CM | POA: Diagnosis not present

## 2014-06-29 DIAGNOSIS — I351 Nonrheumatic aortic (valve) insufficiency: Secondary | ICD-10-CM | POA: Diagnosis present

## 2014-06-29 DIAGNOSIS — I10 Essential (primary) hypertension: Secondary | ICD-10-CM | POA: Diagnosis present

## 2014-06-29 DIAGNOSIS — D62 Acute posthemorrhagic anemia: Secondary | ICD-10-CM | POA: Diagnosis present

## 2014-06-29 DIAGNOSIS — K0530 Chronic periodontitis, unspecified: Secondary | ICD-10-CM | POA: Diagnosis present

## 2014-06-29 DIAGNOSIS — K029 Dental caries, unspecified: Secondary | ICD-10-CM | POA: Diagnosis present

## 2014-06-29 DIAGNOSIS — Z87891 Personal history of nicotine dependence: Secondary | ICD-10-CM | POA: Diagnosis not present

## 2014-06-29 DIAGNOSIS — Z01818 Encounter for other preprocedural examination: Secondary | ICD-10-CM | POA: Diagnosis not present

## 2014-06-29 DIAGNOSIS — I482 Chronic atrial fibrillation: Secondary | ICD-10-CM | POA: Diagnosis not present

## 2014-06-29 DIAGNOSIS — Z9081 Acquired absence of spleen: Secondary | ICD-10-CM | POA: Diagnosis present

## 2014-06-29 DIAGNOSIS — I350 Nonrheumatic aortic (valve) stenosis: Secondary | ICD-10-CM | POA: Diagnosis not present

## 2014-06-29 DIAGNOSIS — K083 Retained dental root: Secondary | ICD-10-CM | POA: Diagnosis present

## 2014-06-29 DIAGNOSIS — R531 Weakness: Secondary | ICD-10-CM | POA: Diagnosis present

## 2014-06-29 DIAGNOSIS — K045 Chronic apical periodontitis: Secondary | ICD-10-CM | POA: Diagnosis present

## 2014-06-29 DIAGNOSIS — K036 Deposits [accretions] on teeth: Secondary | ICD-10-CM | POA: Diagnosis present

## 2014-06-29 LAB — COMPREHENSIVE METABOLIC PANEL
ALT: 17 U/L (ref 17–63)
AST: 25 U/L (ref 15–41)
Albumin: 2.5 g/dL — ABNORMAL LOW (ref 3.5–5.0)
Alkaline Phosphatase: 49 U/L (ref 38–126)
Anion gap: 9 (ref 5–15)
BUN: 10 mg/dL (ref 6–20)
CO2: 22 mmol/L (ref 22–32)
Calcium: 8.6 mg/dL — ABNORMAL LOW (ref 8.9–10.3)
Chloride: 102 mmol/L (ref 101–111)
Creatinine, Ser: 1.04 mg/dL (ref 0.61–1.24)
GFR calc Af Amer: >60 mL/min (ref >60)
GFR calc non Af Amer: >60 mL/min (ref >60)
Glucose, Bld: 97 mg/dL (ref 65–99)
Potassium: 3.7 mmol/L (ref 3.5–5.1)
Sodium: 133 mmol/L — ABNORMAL LOW (ref 135–145)
Total Bilirubin: 0.5 mg/dL (ref 0.3–1.2)
Total Protein: 6.3 g/dL — ABNORMAL LOW (ref 6.5–8.1)

## 2014-06-29 LAB — CBC
HCT: 25.5 % — ABNORMAL LOW (ref 39.0–52.0)
Hemoglobin: 8.1 g/dL — ABNORMAL LOW (ref 13.0–17.0)
MCH: 28.4 pg (ref 26.0–34.0)
MCHC: 31.8 g/dL (ref 30.0–36.0)
MCV: 89.5 fL (ref 78.0–100.0)
Platelets: 361 10*3/uL (ref 150–400)
RBC: 2.85 MIL/uL — ABNORMAL LOW (ref 4.22–5.81)
RDW: 17.5 % — ABNORMAL HIGH (ref 11.5–15.5)
WBC: 7.7 10*3/uL (ref 4.0–10.5)

## 2014-06-29 LAB — HEPARIN LEVEL (UNFRACTIONATED)
Heparin Unfractionated: 0.22 IU/mL — ABNORMAL LOW (ref 0.30–0.70)
Heparin Unfractionated: 0.34 IU/mL (ref 0.30–0.70)

## 2014-06-29 MED ORDER — ONDANSETRON HCL 4 MG/2ML IJ SOLN: 4.0000 mg | Freq: Four times a day (QID) | INTRAMUSCULAR | Status: AC | PRN

## 2014-06-29 MED ORDER — PANTOPRAZOLE SODIUM 40 MG IV SOLR: 40.0000 mg | Freq: Two times a day (BID) | INTRAVENOUS | Status: AC

## 2014-06-29 MED ORDER — SORBITOL 70 % SOLN: 30.0000 mL | Freq: Every day | Status: DC | PRN

## 2014-06-29 MED ORDER — ACETAMINOPHEN 325 MG PO TABS: 650.0000 mg | ORAL_TABLET | Freq: Four times a day (QID) | ORAL | Status: DC | PRN

## 2014-06-29 MED ORDER — HEPARIN (PORCINE) IN NACL 100-0.45 UNIT/ML-% IJ SOLN: 1300.0000 [IU]/h | INTRAMUSCULAR | Status: AC

## 2014-06-29 MED ORDER — PANTOPRAZOLE SODIUM 40 MG IV SOLR
40.0000 mg | Freq: Two times a day (BID) | INTRAVENOUS | Status: DC
  Administered 2014-06-29 – 2014-07-04 (×11): 40 mg via INTRAVENOUS
  Filled 2014-06-29 (×14): qty 40

## 2014-06-29 MED ORDER — CETYLPYRIDINIUM CHLORIDE 0.05 % MT LIQD
7.0000 mL | Freq: Two times a day (BID) | OROMUCOSAL | Status: DC
  Administered 2014-07-02 – 2014-07-04 (×3): 7 mL via OROMUCOSAL

## 2014-06-29 MED ORDER — ONDANSETRON HCL 4 MG/2ML IJ SOLN: 4.0000 mg | Freq: Four times a day (QID) | INTRAMUSCULAR | Status: DC | PRN

## 2014-06-29 MED ORDER — HEPARIN (PORCINE) IN NACL 100-0.45 UNIT/ML-% IJ SOLN
1450.0000 [IU]/h | INTRAMUSCULAR | Status: AC
  Administered 2014-06-30 (×2): 1300 [IU]/h via INTRAVENOUS
  Administered 2014-07-01 – 2014-07-02 (×2): 1500 [IU]/h via INTRAVENOUS
  Administered 2014-07-03: 1400 [IU]/h via INTRAVENOUS
  Administered 2014-07-03 – 2014-07-04 (×2): 1450 [IU]/h via INTRAVENOUS
  Filled 2014-06-29 (×9): qty 250

## 2014-06-29 MED ORDER — ONDANSETRON HCL 4 MG PO TABS: 4.0000 mg | ORAL_TABLET | Freq: Four times a day (QID) | ORAL | Status: DC | PRN

## 2014-06-29 MED ORDER — ACETAMINOPHEN 325 MG PO TABS
650.0000 mg | ORAL_TABLET | Freq: Four times a day (QID) | ORAL | Status: DC | PRN
Start: 2014-06-29 — End: 2014-07-05

## 2014-06-29 NOTE — Evaluation (Signed)
Occupational Therapy Evaluation Patient Details Name: [name] MRN: [id number] DOB: March 21, 1945 Today's Date: 06/29/2014    History of Present Illness 69 yo male admitted with aortic insufficiency; hx aortic valve replacement in the 1990's, splenectomy. He was tx to [facility] 5/19 from [place] with cardiogenic shock r/t severe aortic valve insufficiency/ partially thrombosed mechanical AoV after stopping coumadin for unclear reasons several months prior. scheduled for Aortic Valve REplacement, but surgery put on hold secondary to melena, endoscopy 5/26; Difficulty with respiratory recovery post, Critical Care on board; Cardiology would like for [name] to be ambulatory prior to Aortic Valve Replacement   Clinical Impression   This 69 yo male admitted with above presents to acute OT with decreased balance, decreased mobility, increased pain, all affecting his ability to care for himself at home. He will benefit from acute OT with follow up on CIR to get as strong and independent as possible before is next surgery.    Follow Up Recommendations  CIR    Equipment Recommendations   (TBD at next venue)       Precautions / Restrictions Precautions Precautions: Fall Restrictions Weight Bearing Restrictions: No      Mobility Bed Mobility Overal bed mobility: Needs Assistance Bed Mobility: Rolling;Sidelying to Sit Rolling: Min guard Sidelying to sit: Min guard;HOB elevated       General bed mobility comments: cues for sequence, increased time, rail , HOB 20degrees  Transfers Overall transfer level: Needs assistance Equipment used: Rolling walker (2 wheeled) Transfers: Sit to/from Stand Sit to Stand: Min assist         General transfer comment: cues for hand placement and increased time and cuing for staying forward with his trunk for sit<>stand    Balance Overall balance assessment: Needs assistance Sitting-balance support: No upper extremity supported;Feet  supported Sitting balance-Leahy Scale: Fair       Standing balance-Leahy Scale: Poor                              ADL Overall ADL's : Needs assistance/impaired Eating/Feeding: Set up;Sitting   Grooming: Set up;Sitting   Upper Body Bathing: Sitting;Minimal assitance   Lower Body Bathing: Maximal assistance (with min A +2 sit<>stand with increased time and cuing for staying forward with his trunk for sti<>stand)   Upper Body Dressing : Moderate assistance;Sitting   Lower Body Dressing:  (with min A sit<>stand with increased time and cuing for staying forward with his trunk for sit<>stand)   Toilet Transfer: Minimal assistance;Ambulation;RW (with min A sit<>stand with increased time and cuing for staying forward with his trunk for sit<>stand)   Toileting- Clothing Manipulation and Hygiene:  (with min sit<>stand with increased time and cuing for staying forward with his trunk for sit<>stand)         General ADL Comments: Every activity takes increased time due to pt moves slowly     Vision Additional Comments: Wears glasses or far and near          Pertinent Vitals/Pain Pain Assessment: Faces Pain Score: 4  Faces Pain Scale: Hurts little more Pain Location: bil calf pain Pain Descriptors / Indicators: Aching Pain Intervention(s): Limited activity within patient's tolerance;Repositioned     Hand Dominance Right   Extremity/Trunk Assessment Upper Extremity Assessment Upper Extremity Assessment: Overall WFL for tasks assessed (Decreased AROM at shoulders while seated in recliner--can get more with PROM to about 160 degrees of flexion) LUE Deficits / Details: 5/5 hand  grip strength           Communication Communication Communication:  (slow in responding at times and speaks softly)   Cognition Arousal/Alertness: Awake/alert Behavior During Therapy: Flat affect Overall Cognitive Status: Within Functional Limits for tasks assessed                  General Comments: slow to answer questions and slow to move              Home Living Family/patient expects to be discharged to:: Inpatient rehab Living Arrangements: Children Available Help at Discharge: Family;Available PRN/intermittently (Pt also says there is a PCA there for his wife when his daughter is at work) Type of Home: House Home Access: Stairs to enter     Home Layout: One level                   Additional Comments: Pt reports he sponge bathes      Prior Functioning/Environment Level of Independence: Independent (for B/D per pt report)             OT Diagnosis: Generalized weakness;Cognitive deficits;Acute pain   OT Problem List: Decreased strength;Decreased range of motion;Impaired balance (sitting and/or standing);Pain;Decreased knowledge of use of DME or AE   OT Treatment/Interventions: Self-care/ADL training;Patient/family education;Balance training;Therapeutic exercise;Therapeutic activities;DME and/or AE instruction    OT Goals(Current goals can be found in the care plan section) Acute Rehab OT Goals Patient Stated Goal: Rehab then home before next surgery OT Goal Formulation: With patient Time For Goal Achievement: 07/13/14  OT Frequency: Min 2X/week           Co-evaluation   Reason for Co-Treatment: For patient/therapist safety PT goals addressed during session: Mobility/safety with mobility;Proper use of DME;Balance        End of Session Equipment Utilized During Treatment: Gait belt;Rolling walker  Activity Tolerance: Patient tolerated treatment well Patient left: in chair;with call bell/phone within reach   Time: 0807-0831 OT Time Calculation (min): 24 min Charges:  OT General Charges $OT Visit: 1 Procedure OT Evaluation $Initial OT Evaluation Tier I: 1 Procedure  [name] [phone] 06/29/2014, 10:03 AM

## 2014-06-29 NOTE — Progress Notes (Signed)
[place] for Heparin Indication: DVT  No Known Allergies  Patient Measurements: Height: 5\' 8"  (172.7 cm) Weight: 220 lb 0.3 oz (99.8 kg) IBW/kg (Calculated) : 68.4 Heparin Dosing Weight: 90kg   Vital Signs: Temp: 97.7 F (36.5 C) (06/03 1200) Temp Source: Oral (06/03 1200) BP: 95/72 mmHg (06/03 1200) Pulse Rate: 96 (06/03 1000)  Labs:  Recent Labs  06/27/14 0515 06/27/14 1300 06/28/14 0500 06/29/14 0435 06/29/14 1115  HGB 8.1* 8.5* 8.1* 8.1*  --   HCT 24.9* 25.6* 24.8* 25.5*  --   PLT 297 319 326 361  --   HEPARINUNFRC 0.35  --  0.36 0.22* 0.34  CREATININE 1.01  --  1.04 1.04  --     Estimated Creatinine Clearance: 79 mL/min (by C-G formula based on Cr of 1.04).   Assessment: 69yom with Hx AVR admitted with AV thrombus after stopping his warfarin 3 months ago - on his own.  While being treated with heparin and integrilin for the clot, he developed GIB - s/p EGD with epi injection and clipping to gastric cardia erosion with visible vessel.  Anticoagulation was held and PRBC and PPI given.  He subsequently developed LUE DVT and pharmacy was consulted to dose heparin at a low rate without bolus and to maintain a low heparin level goal.   -Heparin level is now at goal (HL= 0.34) after increase from 1150 units/hr to 1300 units/hr. Hg= 8.2 and stable. Pt noted with black tarry stools 6/1 but as of 6/3 stool appears normal.  -Plans to transfer to Rehab on heparin today   Goal of Therapy:  Heparin level 0.3-0.5 units/ml Monitor platelets by anticoagulation protocol: Yes    Plan:  -No heparin changes needed -Daily heparin level and CBC -Will follow anticoagulation plans  Hildred Laser, Pharm D 06/29/2014 12:49 PM

## 2014-06-29 NOTE — Progress Notes (Signed)
Patient ID: [name], male   DOB: 09-04-1945, 69 y.o.   MRN: [id number] Patient admitted to [phone] via wheelchair, escorted by nursing staff.  Patient verbalized understanding of rehab process, signed fall safety agreement.  Patient appears to be in no immediate distress at this time.  Will continue to monitor.  Brita Romp, RN

## 2014-06-29 NOTE — Progress Notes (Signed)
ANTICOAGULATION CONSULT NOTE   Pharmacy Consult for Heparin Indication: DVT  No Known Allergies  Patient Measurements:   Heparin Dosing Weight: 90kg   Vital Signs: Temp: 97.7 F (36.5 C) (06/03 1200) Temp Source: Oral (06/03 1200) BP: 95/72 mmHg (06/03 1200) Pulse Rate: 96 (06/03 1000)  Labs:  Recent Labs  06/27/14 0515 06/27/14 1300 06/28/14 0500 06/29/14 0435 06/29/14 1115  HGB 8.1* 8.5* 8.1* 8.1*  --   HCT 24.9* 25.6* 24.8* 25.5*  --   PLT 297 319 326 361  --   HEPARINUNFRC 0.35  --  0.36 0.22* 0.34  CREATININE 1.01  --  1.04 1.04  --     Estimated Creatinine Clearance: 79 mL/min (by C-G formula based on Cr of 1.04).   Assessment: 58 YOM with Hx AVR admitted with AV thrombus after stopping his warfarin 3 months ago - on his own.  While being treated with heparin and integrilin for the clot, he developed GIB - s/p EGD with epi injection and clipping to gastric cardia erosion with visible vessel.  Anticoagulation was held and PRBC and PPI given.  He subsequently developed LUE DVT and pharmacy was consulted to dose heparin at a low rate without bolus and to maintain a low heparin level goal.   The patient wast transferred to CIR on 6/3 evening and heparin was resumed at the previously therapeutic rate of 1300 units/hr (13 ml/hr). The patient was noted to have black tarry stools on 6/1 but as of 6/3 stool appears normal. Next HL check is in the AM.  Goal of Therapy:  Heparin level 0.3-0.5 units/ml Monitor platelets by anticoagulation protocol: Yes    Plan:  1. Continue heparin at 1300 units/hr (13 ml/hr) 2. Will continue to monitor for any signs/symptoms of bleeding and will follow up with heparin level in the a.m.   Alycia Rossetti, PharmD, BCPS Clinical Pharmacist Pager: [phone] 06/29/2014 5:43 PM

## 2014-06-29 NOTE — Progress Notes (Signed)
PMR Admission Coordinator Pre-Admission Assessment  Patient: [name] is an 69 y.o., male MRN: [id number] DOB: 1945/06/20 Height: 5\' 8"  (172.7 cm) Weight: 99.8 kg (220 lb 0.3 oz)  Insurance Information HMO: yes PPO: PCP: IPA: 80/20: OTHER: medicare replacement PRIMARY: [employer] Silverback Policy#: [id number] Subscriber: pt CM Name: Amy Phone#: [phone] Fax#: [phone] Pre-Cert#: [id number] Employer: retired approved 6/3 until 06/30/2014 Benefits: Phone #: [phone] Name: 06/28/14 Eff. Date: 01/26/14 Deduct: none Out of Pocket Max: $5500 Life Max: none CIR: $275 per day days 1-7 then covers 100% SNF: no copay days 1-20; $160 copay per day days 21-100 Outpatient: $40 copay per visit Co-Pay: no visit limit Home Health: 100% Co-Pay: no visit limit DME: 80% Co-Pay: 20% Providers: in network  SECONDARY: none   Medicaid Application Date: Case Manager:  Disability Application Date: Case Worker:   Emergency Facilities manager Information    Name Relation Home Work Mobile   [place] Daughter [phone]     Luismanuel, Corman [phone]       Current Medical History  Patient Admitting Diagnosis: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 right handed male with history of aortic valve repair in the setting of aortic aneurysm in the 1990s by Dr. [name] and recently stopped Coumadin secondary to wall no ulcer was significant bleeding treated at [facility] in August 2014. Patient lives with his medically disabled wife and daughter used a cane prior to admission.  Presented 06/14/2014 to [facility] complaining of  shortness of breath with cardiogenic shock found to have severe aortic valve insufficiency and was transferred to [facility] for further evaluation. Placed on intravenous heparin. TEE completed showing ejection fraction of 60%. Only one of the leaflets could be visualized as moving movement was significantly reduced. There was severe aortic insufficiency. 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 Cardiac catheterization showed no significant CAD. Cardiothoracic surgery consulted for prosthetic valve dysfunction and need for surgical intervention. Hospital course complicated by GI bleed/melena with gastroenterology consulted 06/17/2014. EGD completed 06/18/2014 showing 2 gastric cardia erosions without active bleeding. Patient has been transfused during hospital course. On 06/21/2014 with increased loose tarry stools EGD repeated 06/21/2014 showing large gastric blood clot with erosion visible inferior to the GE junction. Course complicated 74/08/1446 after venous Doppler study showed acute DVT left brachial and axillary veins. Patient currently remains on heparin therapy. Currently the plan by cardiothoracic surgery is for ongoing physical therapy for strengthening conditioning and plan redo aortic root replacement in approximately 1-2 weeks.   Past Medical History  Past Medical History  Diagnosis Date  . Chest pain   . Hypertension   . Aortic aneurysm 1993  . 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 Pneumonia   . Aortic valve replaced     At [facility]. developed thrombosis of valve  . Hx of aortic aneurysm repair 06/14/2014  . Atrial fibrillation 05/2014  . Gastritis and gastroduodenitis with hemorrhage 05/2014    severe.   . Tubular adenoma of colon 12/2012  . DVT of upper extremity (deep vein thrombosis) 05/2014    left  . Acute blood loss anemia 05/2014      Family History  family history includes Diabetes (age of onset: 39) in his mother; Hypertension (age of onset: 23) in his father. There is no history of Colon cancer.  Prior Rehab/Hospitalizations:  Has the patient had major surgery during 100 days prior to admission? No   Current Medications   Current facility-administered medications:  . acetaminophen (TYLENOL) tablet 650 mg, 650 mg, Oral, Q6H PRN, Dorothy Spark, MD . antiseptic oral rinse (CPC / CETYLPYRIDINIUM CHLORIDE 0.05%) solution 7 mL, 7 mL, Mouth Rinse, q12n4p, Juanito Doom, MD, 7 mL at 06/29/14 1200 . bisacodyl (DULCOLAX) EC tablet 5 mg, 5 mg, Oral, Once, Ivin Poot, MD, 5 mg at 06/21/14 0500 . fentaNYL (SUBLIMAZE) injection 12.5-25 mcg, 12.5-25 mcg, Intravenous, Q2H PRN, Juanito Doom, MD, 12.5 mcg at 06/28/14 0235 . heparin ADULT infusion 100 units/mL (25000 units/250 mL), 1,300 Units/hr, Intravenous, Continuous, Franky Macho, [facility], Last Rate: 13 mL/hr at 06/29/14 0635, 1,300 Units/hr at 06/29/14 0635 . ondansetron (ZOFRAN) injection 4 mg, 4 mg, Intravenous, Q6H PRN, Dorothy Spark, MD . pantoprazole (PROTONIX) injection 40 mg, 40 mg, Intravenous, Q12H, Francesca Oman, DO, 40 mg at 06/29/14 1000 . sodium chloride 0.9 % injection 10-40 mL, 10-40 mL, Intracatheter, Q12H, Dorothy Spark, MD, 30 mL at 06/29/14 1000 . sodium chloride 0.9 % injection 10-40 mL, 10-40 mL, Intracatheter, PRN, Dorothy Spark, MD  Facility-Administered Medications Ordered in Other Encounters:  . fentaNYL (SUBLIMAZE) injection, , , Anesthesia Intra-op, Rokoshi T Flowers, CRNA, 50 mcg at 06/21/14 0700 . lactated ringers infusion, , , Continuous PRN, Rokoshi T Flowers, Immunologist . midazolam (VERSED) 5 MG/5ML injection, , , Anesthesia Intra-op, Rokoshi T Flowers, CRNA, 1 mg at 06/21/14 0700 . phenylephrine (NEO-SYNEPHRINE) injection, , , Anesthesia Intra-op, Rokoshi T Flowers, CRNA, 40 mcg at 06/21/14 0725  Patients  Current Diet: DIET SOFT Room service appropriate?: Yes; Fluid consistency:: Thin  Precautions / Restrictions Precautions Precautions: Fall Precaution Comments: (Bilateral knee pain with any ROM on initial PT eval) Restrictions Weight Bearing Restrictions: No   Has the patient had 2 or more falls or a fall with injury in the past year?No  Prior Activity Level Limited Community (1-2x/wk): gradual decline over past 6 months. Used cane . Sponge baths for past 6 months due to fatiguability. Some SOB.  Home Assistive Devices / Equipment Home Assistive Devices/Equipment: Cane (specify quad or straight) Home Equipment: Other (comment) (to be determined)  Prior Device Use: Indicate devices/aids used by the patient prior to current illness, exacerbation or injury? cane  Prior Functional Level Prior Function Level of Independence: Independent with assistive device(s) (cane) Comments: n/a Gradual decline in function over past 6 months. Used a cane . Could walk to mail box but became fatigued. Sponge bathes due to fatiguability.  Self Care: Did the patient need help bathing, dressing, using the toilet or eating? Independent But sponge baths only  Indoor Mobility: Did the patient need assistance with walking from room to room (with or without device)? Independent  Stairs: Did the patient need assistance with internal or external stairs (with or without device)? Independent  Functional Cognition: Did the patient need help planning regular tasks such as shopping or remembering to take medications? Dependent  Current Functional Level Cognition  Overall Cognitive Status: Within Functional Limits for tasks assessed Orientation Level: Oriented X4 General Comments: slow to answer questions and slow to move   Extremity Assessment (includes Sensation/Coordination)  Upper Extremity Assessment: Overall WFL for tasks assessed (Decreased AROM at shoulders while seated in recliner--can get more  with PROM to about 160 degrees of flexion) LUE Deficits / Details: 5/5 hand grip strength LUE: Unable to fully assess due to pain  Lower Extremity Assessment:  RLE deficits/detail, LLE deficits/detail RLE Deficits / Details: Significant grimace with any movement, especially painful with knee flexion as his LEs cleared teh EOB; provided support to both lower legs to be able to reach EOB successfully RLE: Unable to fully assess due to pain LLE Deficits / Details: Significant grimace with any movement, especially painful with knee flexion as his LEs cleared teh EOB; provided support to both lower legs to be able to reach EOB successfully LLE: Unable to fully assess due to pain    ADLs  Overall ADL's : Needs assistance/impaired Eating/Feeding: Set up, Sitting Grooming: Set up, Sitting Upper Body Bathing: Sitting, Minimal assitance Lower Body Bathing: Maximal assistance (with min A +2 sit<>stand with increased time and cuing for staying forward with his trunk for sti<>stand) Upper Body Dressing : Moderate assistance, Sitting Lower Body Dressing: (with min A sit<>stand with increased time and cuing for staying forward with his trunk for sit<>stand) Toilet Transfer: Minimal assistance, Ambulation, RW (with min A sit<>stand with increased time and cuing for staying forward with his trunk for sit<>stand) Toileting- Clothing Manipulation and Hygiene: (with min sit<>stand with increased time and cuing for staying forward with his trunk for sit<>stand) General ADL Comments: Every activity takes increased time due to pt moves slowly    Mobility  Overal bed mobility: Needs Assistance Bed Mobility: Rolling, Sidelying to Sit Rolling: Min guard Sidelying to sit: Min guard, HOB elevated Supine to sit: Min assist Sit to supine: Mod assist, +2 for safety/equipment General bed mobility comments: cues for sequence, increased time, rail , HOB 20degrees    Transfers  Overall transfer level: Needs  assistance Equipment used: Rolling walker (2 wheeled) Transfers: Sit to/from Stand Sit to Stand: Min assist General transfer comment: cues for hand placement and increased time and cuing for staying forward with his trunk for sit<>stand    Ambulation / Gait / Stairs / Wheelchair Mobility  Ambulation/Gait Ambulation/Gait assistance: Min assist, +2 safety/equipment Ambulation Distance (Feet): 100 Feet Assistive device: Rolling walker (2 wheeled) General Gait Details: cues for posture, position in RW and to increase stride with chair pulled behind pt and encouragement to increase distance Gait Pattern/deviations: Step-through pattern, Decreased stride length, Trunk flexed Gait velocity interpretation: Below normal speed for age/gender    Posture / Balance Balance Overall balance assessment: Needs assistance Sitting-balance support: No upper extremity supported, Feet supported Sitting balance-Leahy Scale: Fair Standing balance-Leahy Scale: Poor    Special needs/care consideration Continuous Drip IV heparin ? Pharmacy dosing Bowel mgmt: 06/28/14 black smear Bladder mgmt: condom catheter    Previous Home Environment Living Arrangements: Spouse/significant other, Children (lives with wife, dtr [name] and son [name]) Lives With: Spouse, Family, Son, Daughter Available Help at Discharge: Family, Available 24 hours/day (dtr, [name] works nights and son [name] unemployed) Type of Home: [place]: One level Home Access: Stairs to enter Technical brewer of Steps: (to be determined) [place]: No Additional Comments: Pt reports he sponge bathes Wife with h/o renal transplant and is bedridden from crippling arthritis. Wife has an aide 8 am until 1 pm while dtr, [name], sleeps ( she works third shift). Angela cares for Mom 3 until 6 then goes to work. [name], son, 67 yo and unemployed. Also Angela's 24 yo son in the home. [name] states [name] would be  available to provide for pt 24/7.  Discharge Living Setting Plans for Discharge Living Setting: Patient's home, Lives with (comment), Other (Comment) (wife, dtr and son) Type of [facility] at Discharge: [facility] Discharge  Home Layout: One level Discharge Home Access: Stairs to enter Does the patient have any problems obtaining your medications?: No  Social/Family/Support Systems Patient Roles: Spouse, Parent Contact Information: Fransico Setters, dtr Anticipated Caregiver: [name] and [name], dtr and son. Cherie is dtr here in [employer] Anticipated Caregiver's Contact Information: see above Ability/Limitations of Caregiver: [name] works nights. [name] unemployed. Wife is bedrridden and 26 yo grandson Caregiver Availability: 24/7 Discharge Plan Discussed with Primary Caregiver: Yes Is Caregiver In Agreement with Plan?: Yes Does Caregiver/Family have Issues with Lodging/Transportation while Pt is in Rehab?: No   Goals/Additional Needs Patient/Family Goal for Rehab: supervision with PT and OT Expected length of stay: ELOS 9-14 days. Goal to prepare pt functionally to be readmitted to acute for redo valavle surgery, or to get to supervision level and d/c home wiht family and await surgery at a later date as defined by Dr. Prescott Gum Equipment Needs: IV heparin to bridge to coumadin vs to bridge until surgery Pt/Family Agrees to Admission and willing to participate: Yes Program Orientation Provided & Reviewed with Pt/Caregiver Including Roles & Responsibilities: Yes  Plan is to rehab pt to point that Dr. Prescott Gum to plan surgery once d/c from CIR vs pt to d/c home to further recuperate and prepare for surgery at a later date. I have discussed with both pt and his daughter, [name] of two d/c options.  Decrease burden of Care through IP rehab admission: n/a  Possible need for SNF placement upon discharge:not anticipated  Patient Condition: This patient's condition remains as documented in the consult  dated 06/27/2104, in which the Rehabilitation Physician determined and documented that the patient's condition is appropriate for intensive rehabilitative care in an inpatient rehabilitation facility. Will admit to inpatient rehab today.  Preadmission Screen Completed By: Cleatrice Burke, 06/29/2014 1:01 PM ______________________________________________________________________  Discussed status with Dr. Naaman Plummer on 06/29/2014 at 1258 and received telephone approval for admission today.  Admission Coordinator: Cleatrice Burke, time 1829 Date 06/29/2014.          Cosigned by: Meredith Staggers, MD at 06/29/2014 1:30 PM  Revision History     Date/Time User Provider Type Action   06/29/2014 1:30 PM Meredith Staggers, MD Physician Cosign   06/29/2014 1:01 PM Cristina Gong, RN Rehab Admission Coordinator Sign

## 2014-06-29 NOTE — H&P (Signed)
Physical Medicine and Rehabilitation Admission H&P    Chief complaint: Weakness  HPI: [name] is a 69 y.o. 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 [name] and recently stopped Coumadin secondary to wall no ulcer was significant bleeding treated at [facility] in August 2014. Patient lives with his medically disabled wife and daughter used a cane prior to admission. Presented 06/14/2014 to [facility] complaining of shortness of breath with cardiogenic shock found to have severe aortic valve insufficiency and was transferred to [facility] for further evaluation. Placed on intravenous heparin. TEE completed showing ejection fraction of 60%. Only one of the leaflets could be visualized as moving movement was significantly reduced. There was severe aortic insufficiency. 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 Cardiac catheterization showed no significant CAD. Cardiothoracic surgery consulted for prosthetic valve dysfunction and need for surgical intervention. Hospital course complicated by GI bleed/melena with gastroenterology consulted 06/17/2014. EGD completed 06/18/2014 showing 2 gastric cardia erosions without active bleeding. Patient has been transfused during hospital course. On 06/21/2014 with increased loose tarry stools EGD repeated 06/21/2014 showing large gastric blood clot with erosion visible inferior to the GE junction. Course complicated 86/57/8469 after venous Doppler study showed acute DVT left brachial and axillary veins. Patient currently remains on heparin therapy. Currently the plan by cardiothoracic surgery is for ongoing physical therapy for strengthening conditioning and plan redo aortic root replacement in approximately 1-2 weeks. Physical therapy evaluation completed and ongoing. Patient was  admitted for comprehensive rehabilitation program  ROS Review of Systems  HENT: Negative for hearing loss.  Eyes: Negative for double vision, negative eye discharge.  Respiratory: Positive for shortness of breath, positive nonproductive cough.  Cardiovascular: Positive for palpitations and leg swelling.  Gastrointestinal: Positive for blood in stool.  Genitourinary: Negative for dysuria, negative frequency.  Musculoskeletal: Positive for back pain. Negative for myalgias and neck pain.  Skin: Negative for rash.  Neurological: Positive for weakness. Negative for dizziness.  Psychiatric/Behavioral: Negative for suicidal ideas Remaining review of systems negative  Past Medical History  Diagnosis Date  . Chest pain   . Hypertension   . Aortic aneurysm 1993  . 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 Pneumonia   . Aortic valve replaced      At [facility]. developed thrombosis of valve  . Hx of aortic aneurysm repair 06/14/2014  . Atrial fibrillation 05/2014  . Gastritis and gastroduodenitis with hemorrhage 05/2014    severe.   . Tubular adenoma of colon 12/2012  . DVT of upper extremity (deep vein thrombosis) 05/2014    left  . Acute blood loss anemia 05/2014    Past Surgical History  Procedure Laterality Date  . Abdominal aortic aneurysm repair  1993    At [facility], Unspecified  . Splenectomy    . Cardiac valve replacement      At [facility]  . Esophagogastroduodenoscopy  Aug 2014    [facility]: large duodenal ulcer with visible vessel, s/p clip and epi, erosive gastritis. +h.pylori serology, treated with amoxicillin and biaxin.   . Colonoscopy N/A 12/27/2012    Procedure: COLONOSCOPY;  Surgeon: Danie Binder, MD;  Location: [facility];  Service: Endoscopy;  Laterality: N/A;  8:45-moved to 855   . Tee without cardioversion N/A 06/15/2014    Procedure: TRANSESOPHAGEAL  ECHOCARDIOGRAM (TEE);  Surgeon: Sueanne Margarita, MD;  Location: [facility] ENDOSCOPY;  Service:  Cardiovascular;  Laterality: N/A;  . Cardiac catheterization N/A 06/15/2014    Procedure: Fluoroscopy Guidance;  Surgeon: Josue Hector, MD;  Location: [place] CV LAB;  Service: Cardiovascular;  Laterality: N/A;  . Esophagogastroduodenoscopy N/A 06/18/2014    Procedure: ESOPHAGOGASTRODUODENOSCOPY (EGD);  Surgeon: Jerene Bears, MD;  Location: [facility] ENDOSCOPY;  Service: Endoscopy;  Laterality: N/A;  . Peripheral vascular catheterization  06/19/2014    Procedure: Aortic Arch Angiography;  Surgeon: Jettie Booze, MD;  Location: [place] CV LAB;  Service: Cardiovascular;;  . Esophagogastroduodenoscopy N/A 06/21/2014    Procedure: ESOPHAGOGASTRODUODENOSCOPY (EGD);  Surgeon: Jerene Bears, MD;  Location: [facility] ENDOSCOPY;  Service: Endoscopy;  Laterality: N/A;  . Esophagogastroduodenoscopy N/A 06/21/2014    Procedure: ESOPHAGOGASTRODUODENOSCOPY (EGD);  Surgeon: Jerene Bears, MD;  Location: [name];  Service: Gastroenterology;  Laterality: N/A;   Family History  Problem Relation Age of Onset  . Hypertension Father 27  . Diabetes Mother 16  . Colon cancer Neg Hx    Social History:  reports that he has quit smoking. He does not have any smokeless tobacco history on file. He reports that he drinks alcohol. He reports that he does not use illicit drugs. Allergies: No Known Allergies Medications Prior to Admission  Medication Sig Dispense Refill  . ferrous sulfate 325 (65 FE) MG EC tablet Take 325 mg by mouth every other day.     . olmesartan-hydrochlorothiazide (BENICAR HCT) 20-12.5 MG per tablet Take 1 tablet by mouth 2 (two) times a week.       Home: Home Living Family/patient expects to be discharged to:: Private residence Living Arrangements: Children Available Help at Discharge: Family, Available PRN/intermittently (Need more info) Type of Home: House Home Access: Stairs to enter [employer] of Steps:  (to be determined) Home Layout: One level Home Equipment: Other  (comment) (to be determined) Additional Comments: Pt very internally distracted by anticipation of pain with moving; Will need to very home information in later session   Functional History: Prior Function Comments: Will need to get this information   Functional Status:  Mobility: Bed Mobility Overal bed mobility: Needs Assistance Bed Mobility: Supine to Sit Supine to sit: Min assist Sit to supine: Mod assist, +2 for safety/equipment General bed mobility comments: in recliner before and after session Transfers Overall transfer level: Needs assistance Transfers: Sit to/from Stand Sit to Stand: Min assist General transfer comment: cues for hand placement and sequence with assist for anterior translation Ambulation/Gait Ambulation/Gait assistance: Min assist, +2 safety/equipment Ambulation Distance (Feet): 44 Feet Assistive device: Rolling walker (2 wheeled) General Gait Details: cues for posture, position in RW and to increase stride with chair pulled behind pt Gait Pattern/deviations: Step-through pattern, Decreased stride length, Trunk flexed Gait velocity interpretation: Below normal speed for age/gender    ADL:    Cognition: Cognition Overall Cognitive Status: Within Functional Limits for tasks assessed Orientation Level: Oriented X4 Cognition Arousal/Alertness: Awake/alert Behavior During Therapy: Flat affect Overall Cognitive Status: Within Functional Limits for tasks assessed Area of Impairment: Attention General Comments: slow to answer questions  Physical Exam: Blood pressure 128/87, pulse 87, temperature 98.3 F (36.8 C), temperature source Oral, resp. rate 17, height _0  (1.727 m), weight 99.8 kg (220 lb 0.3 oz), SpO2 95 %. Physical Exam  Gen: no distress HENT: dentition fair Head: Normocephalic.  Eyes: EOM are normal without nystagmus.  Neck: Normal range of motion. Neck supple.  No thyromegaly present.  Cardiovascular:  Cardiac rate controlled,  irregular/irregular, systolic murmur Respiratory: Effort normal and breath sounds normal. No respiratory distress. No wheezes, rales GI: Soft. Bowel sounds are normal. He exhibits no distension. No pain Musculoskeletal . Patient moves all extremities  Neurological: He is alert.  Mood is flat but appropriate. He is oriented to person place and date of birth. Fair awareness of deficits Follow simple commands. UE's 4/5 delt,bicep, tricep, wrist, hi. LE: 2+ hf, 3+ke and 3+ to 4/5 adf/apf. No gross sensory changes. Seems to lack awareness and insight. Slow to initiate conversation. Fairly alert though Psych: flat Skin: callous over left second PIP, tender to touch.    Results for orders placed or performed during the hospital encounter of 06/14/14 (from the past 48 hour(s))  Clostridium Difficile by PCR     Status: None   Collection Time: 06/27/14 10:25 AM  Result Value Ref Range   C difficile by pcr NEGATIVE NEGATIVE  CBC     Status: Abnormal   Collection Time: 06/27/14  1:00 PM  Result Value Ref Range   WBC 8.0 4.0 - 10.5 K/uL   RBC 2.88 (L) 4.22 - 5.81 MIL/uL   Hemoglobin 8.5 (L) 13.0 - 17.0 g/dL   HCT 25.6 (L) 39.0 - 52.0 %   MCV 88.9 78.0 - 100.0 fL   MCH 29.5 26.0 - 34.0 pg   MCHC 33.2 30.0 - 36.0 g/dL   RDW 17.8 (H) 11.5 - 15.5 %   Platelets 319 150 - 400 K/uL  CBC     Status: Abnormal   Collection Time: 06/28/14  5:00 AM  Result Value Ref Range   WBC 8.4 4.0 - 10.5 K/uL   RBC 2.78 (L) 4.22 - 5.81 MIL/uL   Hemoglobin 8.1 (L) 13.0 - 17.0 g/dL   HCT 24.8 (L) 39.0 - 52.0 %   MCV 89.2 78.0 - 100.0 fL   MCH 29.1 26.0 - 34.0 pg   MCHC 32.7 30.0 - 36.0 g/dL   RDW 17.5 (H) 11.5 - 15.5 %   Platelets 326 150 - 400 K/uL  Comprehensive metabolic panel     Status: Abnormal   Collection Time: 06/28/14  5:00 AM  Result Value Ref Range   Sodium 133 (L) 135 - 145 mmol/L   Potassium 3.8 3.5 - 5.1 mmol/L   Chloride 102 101 - 111 mmol/L   CO2 22 22 - 32 mmol/L   Glucose, Bld 95 65 - 99  mg/dL   BUN 11 6 - 20 mg/dL   Creatinine, Ser 1.04 0.61 - 1.24 mg/dL   Calcium 8.5 (L) 8.9 - 10.3 mg/dL   Total Protein 6.1 (L) 6.5 - 8.1 g/dL   Albumin 2.5 (L) 3.5 - 5.0 g/dL   AST 26 15 - 41 U/L   ALT 18 17 - 63 U/L   Alkaline Phosphatase 49 38 - 126 U/L   Total Bilirubin 0.7 0.3 - 1.2 mg/dL   GFR calc non Af Amer >60 >60 mL/min   GFR calc Af Amer >60 >60 mL/min    Comment: (NOTE) The eGFR has been calculated using the CKD EPI equation. This calculation has not been validated in all clinical situations. eGFR's persistently <60 mL/min signify possible Chronic Kidney Disease.    Anion gap 9 5 - 15  Heparin level (unfractionated)     Status: None   Collection Time: 06/28/14  5:00 AM  Result Value Ref Range   Heparin Unfractionated 0.36 0.30 - 0.70 IU/mL  Comment:        IF HEPARIN RESULTS ARE BELOW EXPECTED VALUES, AND PATIENT DOSAGE HAS BEEN CONFIRMED, SUGGEST FOLLOW UP TESTING OF ANTITHROMBIN III LEVELS.   CBC     Status: Abnormal   Collection Time: 06/29/14  4:35 AM  Result Value Ref Range   WBC 7.7 4.0 - 10.5 K/uL   RBC 2.85 (L) 4.22 - 5.81 MIL/uL   Hemoglobin 8.1 (L) 13.0 - 17.0 g/dL   HCT 25.5 (L) 39.0 - 52.0 %   MCV 89.5 78.0 - 100.0 fL   MCH 28.4 26.0 - 34.0 pg   MCHC 31.8 30.0 - 36.0 g/dL   RDW 17.5 (H) 11.5 - 15.5 %   Platelets 361 150 - 400 K/uL  Heparin level (unfractionated)     Status: Abnormal   Collection Time: 06/29/14  4:35 AM  Result Value Ref Range   Heparin Unfractionated 0.22 (L) 0.30 - 0.70 IU/mL    Comment:        IF HEPARIN RESULTS ARE BELOW EXPECTED VALUES, AND PATIENT DOSAGE HAS BEEN CONFIRMED, SUGGEST FOLLOW UP TESTING OF ANTITHROMBIN III LEVELS.    No results found.     Medical Problem List and Plan: 1. Functional deficits secondary to debilitation due to aortic valve insufficiency, multi-medical. Cardiothoracic surgery plan follow-up for surgical intervention 2.  DVT Prophylaxis/Anticoagulation: Acute DVT left brachial and  axillary veins. Currently on intravenous heparin. Await plan for long-term anticoagulation 3. Pain Management: Tylenol as needed 4. GI bleed. Follow-up gastroenterology services. Transfuse if symptomatic. Continue PPI 5. Neuropsych: This patient is capable of making decisions on his own behalf. 6. Skin/Wound Care: Routine skin checks 7. Fluids/Electrolytes/Nutrition: Routine I&O with follow-up chemistries. 8. Chronic atrial fibrillation. Rate controlled. Intravenous heparin for now for DVT. Further anticoagulation on hold due to GI bleed     Post Admission Physician Evaluation: 1. Functional deficits secondary  to debility related AVI and associated medical issues. 2. Patient is admitted to receive collaborative, interdisciplinary care between the physiatrist, rehab nursing staff, and therapy team. 3. Patient's level of medical complexity and substantial therapy needs in context of that medical necessity cannot be provided at a lesser intensity of care such as a SNF. 4. Patient has experienced substantial functional loss from his/her baseline which was documented above under the "Functional History" and "Functional Status" headings.  Judging by the patient's diagnosis, physical exam, and functional history, the patient has potential for functional progress which will result in measurable gains while on inpatient rehab.  These gains will be of substantial and practical use upon discharge  in facilitating mobility and self-care at the household level. 5. Physiatrist will provide 24 hour management of medical needs as well as oversight of the therapy plan/treatment and provide guidance as appropriate regarding the interaction of the two. 6. 24 hour rehab nursing will assist with bladder management, bowel management, safety, skin/wound care, disease management, medication administration, pain management and patient education  and help integrate therapy concepts, techniques,education, etc. 7. PT will  assess and treat for/with: Lower extremity strength, range of motion, stamina, balance, functional mobility, safety, adaptive techniques and equipment, activity tolerance, monitoring of CV parameters.   Goals are: supervision to mod I. 8. OT will assess and treat for/with: ADL's, functional mobility, safety, upper extremity strength, adaptive techniques and equipment, stamina, activity tolerance, monitoring of CV parameters.   Goals are: mod I to supervision. Therapy may not yet proceed with showering this patient. 9. SLP will assess and treat for/with: n/a.  Goals  are: n/a. 10. Case Management and Social Worker will assess and treat for psychological issues and discharge planning. 11. Team conference will be held weekly to assess progress toward goals and to determine barriers to discharge. 12. Patient will receive at least 3 hours of therapy per day at least 5 days per week. 13. ELOS: 8-12 days       14. Prognosis:  good     Meredith Staggers, MD, [place] [facility] 06/29/2014   06/29/2014

## 2014-06-29 NOTE — Progress Notes (Signed)
Physical Medicine and Rehabilitation Consult Reason for Consult: Debilitation/multi-medical Referring Physician: Dr. Meda Coffee   HPI: [name] is a 69 y.o. right handed male with history of aortic valve repair in the setting of aortic aneurysm in the 1990s by Dr. [name] and recently stopped Coumadin secondary to wall no ulcer was significant bleeding treated at [place] in August 2014. Patient lives with his medically disabled wife and daughter used a cane prior to admission. Presented 06/14/2014 to [facility] complaining of shortness of breath with cardiogenic shock found to have severe aortic valve insufficiency and was transferred to [facility] for further evaluation. Placed on intravenous heparin. TEE completed showing ejection fraction of 60%. Only one of the leaflets could be visualized as moving movement was significantly reduced. There was severe aortic insufficiency. 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 Cardiac catheterization showed no significant CAD. Cardiothoracic surgery consulted for prosthetic valve dysfunction and need for surgical intervention. Hospital course complicated by GI bleed/melena with gastroenterology consulted 06/17/2014. EGD completed 06/18/2014 showing 2 gastric cardia erosions without active bleeding. Patient has been transfused during hospital course. On 06/21/2014 with increased loose tarry stools EGD repeated 06/21/2014 showing large gastric blood clot with erosion visible inferior to the GE junction. Course complicated 18/29/9371 after venous Doppler study showed acute DVT left brachial and axillary veins. Patient currently remains on heparin therapy. Currently the plan by cardiothoracic surgery is for ongoing physical therapy for strengthening conditioning and plan redo aortic root replacement in approximately 1-2 weeks.   Review of Systems  HENT: Negative for  hearing loss.  Eyes: Negative for double vision.  Respiratory: Positive for shortness of breath.  Cardiovascular: Positive for palpitations and leg swelling.  Gastrointestinal: Positive for blood in stool.  Genitourinary: Negative for dysuria.  Musculoskeletal: Positive for back pain. Negative for myalgias and neck pain.  Skin: Negative for rash.  Neurological: Positive for weakness. Negative for dizziness.  Psychiatric/Behavioral: Negative for suicidal ideas.   Past Medical History  Diagnosis Date  . Chest pain   . Hypertension   . Aortic aneurysm 1993  . 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 Pneumonia   . Aortic valve replaced     At [facility]. developed thrombosis of valve  . Hx of aortic aneurysm repair 06/14/2014  . Atrial fibrillation 05/2014  . Gastritis and gastroduodenitis with hemorrhage 05/2014    severe.   . Tubular adenoma of colon 12/2012  . DVT of upper extremity (deep vein thrombosis) 05/2014    left  . Acute blood loss anemia 05/2014    Past Surgical History  Procedure Laterality Date  . Abdominal aortic aneurysm repair  1993    At [facility], Unspecified  . Splenectomy    . Cardiac valve replacement      At [facility]  . Esophagogastroduodenoscopy  Aug 2014    [facility]: large duodenal ulcer with visible vessel, s/p clip and epi, erosive gastritis. +h.pylori serology, treated with amoxicillin and biaxin.   . Colonoscopy N/A 12/27/2012    Procedure: COLONOSCOPY; Surgeon: Danie Binder, MD; Location[facility]; Service: Endoscopy; Laterality: N/A; 8:45-moved to 855   . Tee without cardioversion N/A 06/15/2014    Procedure: TRANSESOPHAGEAL ECHOCARDIOGRAM (TEE); Surgeon: Sueanne Margarita, MD; Location: [place]; Service: Cardiovascular; Laterality: N/A;  . Cardiac catheterization N/A 06/15/2014    Procedure: Fluoroscopy  Guidance; Surgeon: Josue Hector, MD; Location: [facility]  INVASIVE CV LAB; Service: Cardiovascular; Laterality: N/A;  . Esophagogastroduodenoscopy N/A 06/18/2014    Procedure: ESOPHAGOGASTRODUODENOSCOPY (EGD); Surgeon: Jerene Bears, MD; Location: [facility] ENDOSCOPY; Service: Endoscopy; Laterality: N/A;  . Peripheral vascular catheterization  06/19/2014    Procedure: Aortic Arch Angiography; Surgeon: Jettie Booze, MD; Location: [place] CV LAB; Service: Cardiovascular;;  . Esophagogastroduodenoscopy N/A 06/21/2014    Procedure: ESOPHAGOGASTRODUODENOSCOPY (EGD); Surgeon: Jerene Bears, MD; Location: [facility] ENDOSCOPY; Service: Endoscopy; Laterality: N/A;  . Esophagogastroduodenoscopy N/A 06/21/2014    Procedure: ESOPHAGOGASTRODUODENOSCOPY (EGD); Surgeon: Jerene Bears, MD; Location: [place]; Service: Gastroenterology; Laterality: N/A;   Family History  Problem Relation Age of Onset  . Hypertension Father 23  . Diabetes Mother 19  . Colon cancer Neg Hx    Social History:  reports that he has quit smoking. He does not have any smokeless tobacco history on file. He reports that he drinks alcohol. He reports that he does not use illicit drugs. Allergies: No Known Allergies Medications Prior to Admission  Medication Sig Dispense Refill  . ferrous sulfate 325 (65 FE) MG EC tablet Take 325 mg by mouth every other day.     . olmesartan-hydrochlorothiazide (BENICAR HCT) 20-12.5 MG per tablet Take 1 tablet by mouth 2 (two) times a week.       Home: Home Living Family/patient expects to be discharged to:: Private residence Living Arrangements: Children Available Help at Discharge: Family, Available PRN/intermittently (Need more info) Type of Home: House Home Access: Stairs to enter [employer] of Steps: (to be determined) Home Layout: One level Home Equipment: Other (comment) (to be determined) Additional  Comments: Pt very internally distracted by anticipation of pain with moving; Will need to very home information in later session  Functional History: Prior Function Comments: Will need to get this information  Functional Status:  Mobility: Bed Mobility Overal bed mobility: Needs Assistance Bed Mobility: Supine to Sit Supine to sit: Min assist Sit to supine: Mod assist, +2 for safety/equipment General bed mobility comments: in recliner before and after session Transfers Overall transfer level: Needs assistance Transfers: Sit to/from Stand Sit to Stand: Min assist General transfer comment: cues for hand placement and sequence with assist for anterior translation Ambulation/Gait Ambulation/Gait assistance: Min assist, +2 safety/equipment Ambulation Distance (Feet): 44 Feet Assistive device: Rolling walker (2 wheeled) General Gait Details: cues for posture, position in RW and to increase stride with chair pulled behind pt Gait Pattern/deviations: Step-through pattern, Decreased stride length, Trunk flexed Gait velocity interpretation: Below normal speed for age/gender    ADL:    Cognition: Cognition Overall Cognitive Status: Within Functional Limits for tasks assessed Orientation Level: Oriented X4 Cognition Arousal/Alertness: Awake/alert Behavior During Therapy: Flat affect Overall Cognitive Status: Within Functional Limits for tasks assessed Area of Impairment: Attention General Comments: slow to answer questions  Blood pressure 138/84, pulse 84, temperature 98.5 F (36.9 C), temperature source Oral, resp. rate 17, height 5\' 8"  (1.727 m), weight 98.3 kg (216 lb 11.4 oz), SpO2 100 %. Physical Exam  HENT:  Head: Normocephalic.  Eyes: EOM are normal.  Neck: Normal range of motion. Neck supple. No thyromegaly present.  Cardiovascular:  Cardiac rate controlled  Respiratory: Effort normal and breath sounds normal. No respiratory distress.  GI: Soft. Bowel sounds are  normal. He exhibits no distension.  Neurological: He is alert.  Mood is flat but appropriate. He is oriented to person place and date of birth. Follow simple commands     Lab Results Last 24 Hours  Results for orders placed or performed during the hospital encounter of 06/14/14 (from the past 24 hour(s))  Clostridium Difficile by PCR Status: None   Collection Time: 06/27/14 10:25 AM  Result Value Ref Range   C difficile by pcr NEGATIVE NEGATIVE  CBC Status: Abnormal   Collection Time: 06/27/14 1:00 PM  Result Value Ref Range   WBC 8.0 4.0 - 10.5 K/uL   RBC 2.88 (L) 4.22 - 5.81 MIL/uL   Hemoglobin 8.5 (L) 13.0 - 17.0 g/dL   HCT 25.6 (L) 39.0 - 52.0 %   MCV 88.9 78.0 - 100.0 fL   MCH 29.5 26.0 - 34.0 pg   MCHC 33.2 30.0 - 36.0 g/dL   RDW 17.8 (H) 11.5 - 15.5 %   Platelets 319 150 - 400 K/uL  CBC Status: Abnormal   Collection Time: 06/28/14 5:00 AM  Result Value Ref Range   WBC 8.4 4.0 - 10.5 K/uL   RBC 2.78 (L) 4.22 - 5.81 MIL/uL   Hemoglobin 8.1 (L) 13.0 - 17.0 g/dL   HCT 24.8 (L) 39.0 - 52.0 %   MCV 89.2 78.0 - 100.0 fL   MCH 29.1 26.0 - 34.0 pg   MCHC 32.7 30.0 - 36.0 g/dL   RDW 17.5 (H) 11.5 - 15.5 %   Platelets 326 150 - 400 K/uL  Comprehensive metabolic panel Status: Abnormal   Collection Time: 06/28/14 5:00 AM  Result Value Ref Range   Sodium 133 (L) 135 - 145 mmol/L   Potassium 3.8 3.5 - 5.1 mmol/L   Chloride 102 101 - 111 mmol/L   CO2 22 22 - 32 mmol/L   Glucose, Bld 95 65 - 99 mg/dL   BUN 11 6 - 20 mg/dL   Creatinine, Ser 1.04 0.61 - 1.24 mg/dL   Calcium 8.5 (L) 8.9 - 10.3 mg/dL   Total Protein 6.1 (L) 6.5 - 8.1 g/dL   Albumin 2.5 (L) 3.5 - 5.0 g/dL   AST 26 15 - 41 U/L   ALT 18 17 - 63 U/L   Alkaline Phosphatase 49 38 - 126 U/L   Total Bilirubin 0.7 0.3 - 1.2 mg/dL   GFR calc  non Af Amer >60 >60 mL/min   GFR calc Af Amer >60 >60 mL/min   Anion gap 9 5 - 15  Heparin level (unfractionated) Status: None   Collection Time: 06/28/14 5:00 AM  Result Value Ref Range   Heparin Unfractionated 0.36 0.30 - 0.70 IU/mL      Imaging Results (Last 48 hours)    No results found.    Assessment/Plan: Diagnosis: debility due to AVI, multiple medical 1. Does the need for close, 24 hr/day medical supervision in concert with the patient's rehab needs make it unreasonable for this patient to be served in a less intensive setting? Yes 2. Co-Morbidities requiring supervision/potential complications: CHF, afib, htn, GIB 3. Due to bladder management, bowel management, safety, skin/wound care, disease management, medication administration, pain management and patient education, does the patient require 24 hr/day rehab nursing? Yes 4. Does the patient require coordinated care of a physician, rehab nurse, PT (1-2 hrs/day, 5 days/week) and OT (1-2 hrs/day, 5 days/week) to address physical and functional deficits in the context of the above medical diagnosis(es)? Yes Addressing deficits in the following areas: balance, endurance, locomotion, strength, transferring, bowel/bladder control, bathing, dressing, feeding, grooming, toileting and psychosocial support 5. Can the patient actively participate in an intensive therapy program of at least 3 hrs of therapy per day at least 5  days per week? Yes 6. The potential for patient to make measurable gains while on inpatient rehab is good 7. Anticipated functional outcomes upon discharge from inpatient rehab are supervision with PT, supervision with OT, n/a with SLP. 8. Estimated rehab length of stay to reach the above functional goals is: 9-14 days 9. Does the patient have adequate social supports and living environment to accommodate these discharge functional goals? Yes 10. Anticipated D/C setting: Home 11. Anticipated  post D/C treatments: HH therapy and Outpatient therapy after discharge 12. Overall Rehab/Functional Prognosis: excellent  RECOMMENDATIONS: This patient's condition is appropriate for continued rehabilitative care in the following setting: CIR Patient has agreed to participate in recommended program. Yes Note that insurance prior authorization may be required for reimbursement for recommended care.  Comment: Rehab Admissions Coordinator to follow up.  Thanks,  Meredith Staggers, MD, Mellody Drown     06/28/2014       Revision History     Date/Time User Provider Type Action   06/28/2014 9:58 AM Meredith Staggers, MD Physician Sign   06/28/2014 9:41 AM Cathlyn Parsons, PA-C Physician Assistant Pend   View Details Report       Routing History     Date/Time From To Method   06/28/2014 9:58 AM Meredith Staggers, MD Meredith Staggers, MD In Basket   06/28/2014 9:58 AM Meredith Staggers, MD Iona Beard, MD Fax

## 2014-06-29 NOTE — Progress Notes (Signed)
[place] for Heparin Indication: DVT  No Known Allergies  Patient Measurements: Height: 5\' 8"  (172.7 cm) Weight: 220 lb 0.3 oz (99.8 kg) IBW/kg (Calculated) : 68.4 Heparin Dosing Weight: 90kg   Vital Signs: Temp: 98.3 F (36.8 C) (06/03 0400) Temp Source: Oral (06/03 0400) BP: 128/87 mmHg (06/03 0400) Pulse Rate: 87 (06/03 0400)  Labs:  Recent Labs  06/27/14 0515 06/27/14 1300 06/28/14 0500 06/29/14 0435  HGB 8.1* 8.5* 8.1* 8.1*  HCT 24.9* 25.6* 24.8* 25.5*  PLT 297 319 326 361  HEPARINUNFRC 0.35  --  0.36 0.22*  CREATININE 1.01  --  1.04  --     Estimated Creatinine Clearance: 79 mL/min (by C-G formula based on Cr of 1.04).   Assessment: 69yom with Hx AVR admitted with AV thrombus after stopping his warfarin 3 months ago - on his own.  While being treated with heparin and integrilin for the clot, he developed GIB - s/p EGD with epi injection and clipping to gastric cardia erosion with visible vessel.  Anticoagulation was held and PRBC and PPI given.  He subsequently developed LUE DVT and pharmacy was consulted to dose heparin at a low rate without bolus and to maintain a low heparin level goal.   Heparin level= 0.22 (subtherapeutic). Hgb low but stable, plt ok. No issues with line or bleeding per RN.   Will increase dose conservatively as pt has been therapeutic x 4 days on current rate.  Goal of Therapy:  Heparin level 0.3-0.5 units/ml Monitor platelets by anticoagulation protocol: Yes    Plan:  Increase heparin to 1300 units/hr F/u 6 hr heparin level Monitor for signs and symptoms of bleeding  Sherlon Handing, PharmD, BCPS Clinical pharmacist, pager [phone]  06/29/2014 5:41 AM

## 2014-06-29 NOTE — Progress Notes (Signed)
I have insurance approval and bed available to admit pt to inpt rehab today. I discussed with Dr. Meda Coffee and she is in agreement. I spoke with pt and his daughter, [name], at bedside. They are in agreement. I discussed with RN and will make the arrangements for later today. I will notify RN CM and SW. [phone]

## 2014-06-29 NOTE — H&P (View-Only) (Signed)
Physical Medicine and Rehabilitation Admission H&P    Chief complaint: Weakness  HPI: [name] is a 69 y.o. 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 [name] and recently stopped Coumadin secondary to wall no ulcer was significant bleeding treated at [facility] in August 2014. Patient lives with his medically disabled wife and daughter used a cane prior to admission. Presented 06/14/2014 to [facility] complaining of shortness of breath with cardiogenic shock found to have severe aortic valve insufficiency and was transferred to [facility] for further evaluation. Placed on intravenous heparin. TEE completed showing ejection fraction of 60%. Only one of the leaflets could be visualized as moving movement was significantly reduced. There was severe aortic insufficiency. 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 Cardiac catheterization showed no significant CAD. Cardiothoracic surgery consulted for prosthetic valve dysfunction and need for surgical intervention. Hospital course complicated by GI bleed/melena with gastroenterology consulted 06/17/2014. EGD completed 06/18/2014 showing 2 gastric cardia erosions without active bleeding. Patient has been transfused during hospital course. On 06/21/2014 with increased loose tarry stools EGD repeated 06/21/2014 showing large gastric blood clot with erosion visible inferior to the GE junction. Course complicated 86/57/8469 after venous Doppler study showed acute DVT left brachial and axillary veins. Patient currently remains on heparin therapy. Currently the plan by cardiothoracic surgery is for ongoing physical therapy for strengthening conditioning and plan redo aortic root replacement in approximately 1-2 weeks. Physical therapy evaluation completed and ongoing. Patient was  admitted for comprehensive rehabilitation program  ROS Review of Systems  HENT: Negative for hearing loss.  Eyes: Negative for double vision, negative eye discharge.  Respiratory: Positive for shortness of breath, positive nonproductive cough.  Cardiovascular: Positive for palpitations and leg swelling.  Gastrointestinal: Positive for blood in stool.  Genitourinary: Negative for dysuria, negative frequency.  Musculoskeletal: Positive for back pain. Negative for myalgias and neck pain.  Skin: Negative for rash.  Neurological: Positive for weakness. Negative for dizziness.  Psychiatric/Behavioral: Negative for suicidal ideas Remaining review of systems negative  Past Medical History  Diagnosis Date  . Chest pain   . Hypertension   . Aortic aneurysm 1993  . 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 Pneumonia   . Aortic valve replaced      At [facility]. developed thrombosis of valve  . Hx of aortic aneurysm repair 06/14/2014  . Atrial fibrillation 05/2014  . Gastritis and gastroduodenitis with hemorrhage 05/2014    severe.   . Tubular adenoma of colon 12/2012  . DVT of upper extremity (deep vein thrombosis) 05/2014    left  . Acute blood loss anemia 05/2014    Past Surgical History  Procedure Laterality Date  . Abdominal aortic aneurysm repair  1993    At [facility], Unspecified  . Splenectomy    . Cardiac valve replacement      At [facility]  . Esophagogastroduodenoscopy  Aug 2014    [facility]: large duodenal ulcer with visible vessel, s/p clip and epi, erosive gastritis. +h.pylori serology, treated with amoxicillin and biaxin.   . Colonoscopy N/A 12/27/2012    Procedure: COLONOSCOPY;  Surgeon: Danie Binder, MD;  Location: [facility] ENDO [facility];  Service: Endoscopy;  Laterality: N/A;  8:45-moved to 855   . Tee without cardioversion N/A 06/15/2014    Procedure: TRANSESOPHAGEAL  ECHOCARDIOGRAM (TEE);  Surgeon: Sueanne Margarita, MD;  Location: [facility] ENDOSCOPY;  Service:  Cardiovascular;  Laterality: N/A;  . Cardiac catheterization N/A 06/15/2014    Procedure: Fluoroscopy Guidance;  Surgeon: Josue Hector, MD;  Location: [place] CV LAB;  Service: Cardiovascular;  Laterality: N/A;  . Esophagogastroduodenoscopy N/A 06/18/2014    Procedure: ESOPHAGOGASTRODUODENOSCOPY (EGD);  Surgeon: Jerene Bears, MD;  Location: [facility] ENDOSCOPY;  Service: Endoscopy;  Laterality: N/A;  . Peripheral vascular catheterization  06/19/2014    Procedure: Aortic Arch Angiography;  Surgeon: Jettie Booze, MD;  Location: [place] CV LAB;  Service: Cardiovascular;;  . Esophagogastroduodenoscopy N/A 06/21/2014    Procedure: ESOPHAGOGASTRODUODENOSCOPY (EGD);  Surgeon: Jerene Bears, MD;  Location: [facility] ENDOSCOPY;  Service: Endoscopy;  Laterality: N/A;  . Esophagogastroduodenoscopy N/A 06/21/2014    Procedure: ESOPHAGOGASTRODUODENOSCOPY (EGD);  Surgeon: Jerene Bears, MD;  Location: [name];  Service: Gastroenterology;  Laterality: N/A;   Family History  Problem Relation Age of Onset  . Hypertension Father 27  . Diabetes Mother 16  . Colon cancer Neg Hx    Social History:  reports that he has quit smoking. He does not have any smokeless tobacco history on file. He reports that he drinks alcohol. He reports that he does not use illicit drugs. Allergies: No Known Allergies Medications Prior to Admission  Medication Sig Dispense Refill  . ferrous sulfate 325 (65 FE) MG EC tablet Take 325 mg by mouth every other day.     . olmesartan-hydrochlorothiazide (BENICAR HCT) 20-12.5 MG per tablet Take 1 tablet by mouth 2 (two) times a week.       Home: Home Living Family/patient expects to be discharged to:: Private residence Living Arrangements: Children Available Help at Discharge: Family, Available PRN/intermittently (Need more info) Type of Home: House Home Access: Stairs to enter [employer] of Steps:  (to be determined) Home Layout: One level Home Equipment: Other  (comment) (to be determined) Additional Comments: Pt very internally distracted by anticipation of pain with moving; Will need to very home information in later session   Functional History: Prior Function Comments: Will need to get this information   Functional Status:  Mobility: Bed Mobility Overal bed mobility: Needs Assistance Bed Mobility: Supine to Sit Supine to sit: Min assist Sit to supine: Mod assist, +2 for safety/equipment General bed mobility comments: in recliner before and after session Transfers Overall transfer level: Needs assistance Transfers: Sit to/from Stand Sit to Stand: Min assist General transfer comment: cues for hand placement and sequence with assist for anterior translation Ambulation/Gait Ambulation/Gait assistance: Min assist, +2 safety/equipment Ambulation Distance (Feet): 44 Feet Assistive device: Rolling walker (2 wheeled) General Gait Details: cues for posture, position in RW and to increase stride with chair pulled behind pt Gait Pattern/deviations: Step-through pattern, Decreased stride length, Trunk flexed Gait velocity interpretation: Below normal speed for age/gender    ADL:    Cognition: Cognition Overall Cognitive Status: Within Functional Limits for tasks assessed Orientation Level: Oriented X4 Cognition Arousal/Alertness: Awake/alert Behavior During Therapy: Flat affect Overall Cognitive Status: Within Functional Limits for tasks assessed Area of Impairment: Attention General Comments: slow to answer questions  Physical Exam: Blood pressure 128/87, pulse 87, temperature 98.3 F (36.8 C), temperature source Oral, resp. rate 17, height _0  (1.727 m), weight 99.8 kg (220 lb 0.3 oz), SpO2 95 %. Physical Exam  Gen: no distress HENT: dentition fair Head: Normocephalic.  Eyes: EOM are normal without nystagmus.  Neck: Normal range of motion. Neck supple.  No thyromegaly present.  Cardiovascular:  Cardiac rate controlled,  irregular/irregular, systolic murmur Respiratory: Effort normal and breath sounds normal. No respiratory distress. No wheezes, rales GI: Soft. Bowel sounds are normal. He exhibits no distension. No pain Musculoskeletal . Patient moves all extremities  Neurological: He is alert.  Mood is flat but appropriate. He is oriented to person place and date of birth. Fair awareness of deficits Follow simple commands. UE's 4/5 delt,bicep, tricep, wrist, hi. LE: 2+ hf, 3+ke and 3+ to 4/5 adf/apf. No gross sensory changes. Seems to lack awareness and insight. Slow to initiate conversation. Fairly alert though Psych: flat Skin: callous over left second PIP, tender to touch.    Results for orders placed or performed during the hospital encounter of 06/14/14 (from the past 48 hour(s))  Clostridium Difficile by PCR     Status: None   Collection Time: 06/27/14 10:25 AM  Result Value Ref Range   C difficile by pcr NEGATIVE NEGATIVE  CBC     Status: Abnormal   Collection Time: 06/27/14  1:00 PM  Result Value Ref Range   WBC 8.0 4.0 - 10.5 K/uL   RBC 2.88 (L) 4.22 - 5.81 MIL/uL   Hemoglobin 8.5 (L) 13.0 - 17.0 g/dL   HCT 25.6 (L) 39.0 - 52.0 %   MCV 88.9 78.0 - 100.0 fL   MCH 29.5 26.0 - 34.0 pg   MCHC 33.2 30.0 - 36.0 g/dL   RDW 17.8 (H) 11.5 - 15.5 %   Platelets 319 150 - 400 K/uL  CBC     Status: Abnormal   Collection Time: 06/28/14  5:00 AM  Result Value Ref Range   WBC 8.4 4.0 - 10.5 K/uL   RBC 2.78 (L) 4.22 - 5.81 MIL/uL   Hemoglobin 8.1 (L) 13.0 - 17.0 g/dL   HCT 24.8 (L) 39.0 - 52.0 %   MCV 89.2 78.0 - 100.0 fL   MCH 29.1 26.0 - 34.0 pg   MCHC 32.7 30.0 - 36.0 g/dL   RDW 17.5 (H) 11.5 - 15.5 %   Platelets 326 150 - 400 K/uL  Comprehensive metabolic panel     Status: Abnormal   Collection Time: 06/28/14  5:00 AM  Result Value Ref Range   Sodium 133 (L) 135 - 145 mmol/L   Potassium 3.8 3.5 - 5.1 mmol/L   Chloride 102 101 - 111 mmol/L   CO2 22 22 - 32 mmol/L   Glucose, Bld 95 65 - 99  mg/dL   BUN 11 6 - 20 mg/dL   Creatinine, Ser 1.04 0.61 - 1.24 mg/dL   Calcium 8.5 (L) 8.9 - 10.3 mg/dL   Total Protein 6.1 (L) 6.5 - 8.1 g/dL   Albumin 2.5 (L) 3.5 - 5.0 g/dL   AST 26 15 - 41 U/L   ALT 18 17 - 63 U/L   Alkaline Phosphatase 49 38 - 126 U/L   Total Bilirubin 0.7 0.3 - 1.2 mg/dL   GFR calc non Af Amer >60 >60 mL/min   GFR calc Af Amer >60 >60 mL/min    Comment: (NOTE) The eGFR has been calculated using the CKD EPI equation. This calculation has not been validated in all clinical situations. eGFR's persistently <60 mL/min signify possible Chronic Kidney Disease.    Anion gap 9 5 - 15  Heparin level (unfractionated)     Status: None   Collection Time: 06/28/14  5:00 AM  Result Value Ref Range   Heparin Unfractionated 0.36 0.30 - 0.70 IU/mL  Comment:        IF HEPARIN RESULTS ARE BELOW EXPECTED VALUES, AND PATIENT DOSAGE HAS BEEN CONFIRMED, SUGGEST FOLLOW UP TESTING OF ANTITHROMBIN III LEVELS.   CBC     Status: Abnormal   Collection Time: 06/29/14  4:35 AM  Result Value Ref Range   WBC 7.7 4.0 - 10.5 K/uL   RBC 2.85 (L) 4.22 - 5.81 MIL/uL   Hemoglobin 8.1 (L) 13.0 - 17.0 g/dL   HCT 25.5 (L) 39.0 - 52.0 %   MCV 89.5 78.0 - 100.0 fL   MCH 28.4 26.0 - 34.0 pg   MCHC 31.8 30.0 - 36.0 g/dL   RDW 17.5 (H) 11.5 - 15.5 %   Platelets 361 150 - 400 K/uL  Heparin level (unfractionated)     Status: Abnormal   Collection Time: 06/29/14  4:35 AM  Result Value Ref Range   Heparin Unfractionated 0.22 (L) 0.30 - 0.70 IU/mL    Comment:        IF HEPARIN RESULTS ARE BELOW EXPECTED VALUES, AND PATIENT DOSAGE HAS BEEN CONFIRMED, SUGGEST FOLLOW UP TESTING OF ANTITHROMBIN III LEVELS.    No results found.     Medical Problem List and Plan: 1. Functional deficits secondary to debilitation due to aortic valve insufficiency, multi-medical. Cardiothoracic surgery plan follow-up for surgical intervention 2.  DVT Prophylaxis/Anticoagulation: Acute DVT left brachial and  axillary veins. Currently on intravenous heparin. Await plan for long-term anticoagulation 3. Pain Management: Tylenol as needed 4. GI bleed. Follow-up gastroenterology services. Transfuse if symptomatic. Continue PPI 5. Neuropsych: This patient is capable of making decisions on his own behalf. 6. Skin/Wound Care: Routine skin checks 7. Fluids/Electrolytes/Nutrition: Routine I&O with follow-up chemistries. 8. Chronic atrial fibrillation. Rate controlled. Intravenous heparin for now for DVT. Further anticoagulation on hold due to GI bleed     Post Admission Physician Evaluation: 1. Functional deficits secondary  to debility related AVI and associated medical issues. 2. Patient is admitted to receive collaborative, interdisciplinary care between the physiatrist, rehab nursing staff, and therapy team. 3. Patient's level of medical complexity and substantial therapy needs in context of that medical necessity cannot be provided at a lesser intensity of care such as a SNF. 4. Patient has experienced substantial functional loss from his/her baseline which was documented above under the "Functional History" and "Functional Status" headings.  Judging by the patient's diagnosis, physical exam, and functional history, the patient has potential for functional progress which will result in measurable gains while on inpatient rehab.  These gains will be of substantial and practical use upon discharge  in facilitating mobility and self-care at the household level. 5. Physiatrist will provide 24 hour management of medical needs as well as oversight of the therapy plan/treatment and provide guidance as appropriate regarding the interaction of the two. 6. 24 hour rehab nursing will assist with bladder management, bowel management, safety, skin/wound care, disease management, medication administration, pain management and patient education  and help integrate therapy concepts, techniques,education, etc. 7. PT will  assess and treat for/with: Lower extremity strength, range of motion, stamina, balance, functional mobility, safety, adaptive techniques and equipment, activity tolerance, monitoring of CV parameters.   Goals are: supervision to mod I. 8. OT will assess and treat for/with: ADL's, functional mobility, safety, upper extremity strength, adaptive techniques and equipment, stamina, activity tolerance, monitoring of CV parameters.   Goals are: mod I to supervision. Therapy may not yet proceed with showering this patient. 9. SLP will assess and treat for/with: n/a.  Goals  are: n/a. 10. Case Management and Social Worker will assess and treat for psychological issues and discharge planning. 11. Team conference will be held weekly to assess progress toward goals and to determine barriers to discharge. 12. Patient will receive at least 3 hours of therapy per day at least 5 days per week. 13. ELOS: 8-12 days       14. Prognosis:  good     Meredith Staggers, MD, [place] [facility] 06/29/2014   06/29/2014

## 2014-06-29 NOTE — Progress Notes (Signed)
Patient Name: [name] Date of Encounter: 06/29/2014   SUBJECTIVE No new complaints today,he continues to walk with PT, feels weak, no significant SOB, no chest pain.    OBJECTIVE  Filed Vitals:   06/29/14 0600 06/29/14 0733 06/29/14 0800 06/29/14 1000  BP: 146/91 134/98 144/90 95/72  Pulse: 91 95 90 96  Temp:  97.6 F (36.4 C)    TempSrc:  Oral    Resp: 15 20 17 22   Height:      Weight:      SpO2: 95% 96% 97% 100%    Intake/Output Summary (Last 24 hours) at 06/29/14 1049 Last data filed at 06/29/14 1000  Gross per 24 hour  Intake 1162.1 ml  Output   2275 ml  Net -1112.9 ml   Filed Weights   06/27/14 0500 06/28/14 0500 06/29/14 0442  Weight: 218 lb 7.6 oz (99.1 kg) 216 lb 11.4 oz (98.3 kg) 220 lb 0.3 oz (99.8 kg)   PHYSICAL EXAM  General: NAD, sitting in recliner at bedside eating breakfast, responds to question, not in any distress Neuro: Alert and oriented X 3. Moves all extremities spontaneously. HEENT:  Normal, AT, [place] Lungs:  CTA Heart: Irregular  3/6 systolic murmur all 4 areas on auscultation but loudest aortic area, Abdomen: Soft, full, non-tender, bowel sounds present Extremities: LUE edema still present, warm and well perfused, lower extremities without edema.  CBC  Recent Labs  06/28/14 0500 06/29/14 0435  WBC 8.4 7.7  HGB 8.1* 8.1*  HCT 24.8* 25.5*  MCV 89.2 89.5  PLT 326 185   Basic Metabolic Panel  Recent Labs  06/28/14 0500 06/29/14 0435  NA 133* 133*  K 3.8 3.7  CL 102 102  CO2 22 22  GLUCOSE 95 97  BUN 11 10  CREATININE 1.04 1.04  CALCIUM 8.5* 8.6*   Radiology/Studies  Dg Chest Portable 1 View  06/21/2014   CLINICAL DATA:  Status post EGD for upper GI bleed, evaluate ETT  EXAM: PORTABLE CHEST - 1 VIEW  COMPARISON:  06/16/2014  FINDINGS: Endotracheal tube terminates 4 cm above the carina.  Mild blunting of the left costophrenic angle. Mild left basilar atelectasis. Lungs otherwise clear. No pleural effusion or  pneumothorax.  Cardiomegaly.  Right IJ venous catheter terminates at the cavoatrial junction.  IMPRESSION: Endotracheal tube terminates 4 cm above the carina.  Right IJ venous catheter terminates at the cavoatrial junction.  No pneumothorax.   Electronically Signed   By: Julian Hy [name]   On: 06/21/2014 16:25   Telemetry:  Irregular, without P waves, HR mostly 90s, occasioanlly 120s.  Echo- TEE- 5/20- EF- 63-14%, Systolic function was normal. The estimated ejection fraction was in the range of 55% to 60%. Wall motion was normal; there were no regional wall motion abnormalities. - Aortic valve: St. Jude Mechanical AVR is present. Only one of the leaflets can be visualized as moving and that movement is significantly reduced. The other leaflet is immobile. There is severe aortic insufficiency that is best appreciated in the 120 degree view. 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  06/19/14 cardiac catheterization  Prior ascending aortic dissection which prompted aortic valve replacement and bypass to the left main and RCA. The bypass appears widely patent.  Significant false lumen still present. There is some difficulty in getting to the coronary vessels and a versa core wire had to be used for this.  There  does not appear to be any significant coronary artery disease in the major epicardial vessels. It is somewhat difficult to see the distal RCA system.  Essentially normal right heart pressures. Hardy 8 output 5.9 L/m. Cardiac index 2.8. pulmonary artery saturation 54%. Plan for high risk aortic valve replacement. If further imaging is required, consider coronary CT to better visualize the anatomy.  06/24/2014 LUE venous Duplex Findings consistent with acute deep vein thrombosis involving the axillary and brachial veins of left upper extremity. There is superficial thrombosis noted in the  basilic vein.  Scheduled Meds: . antiseptic oral rinse  7 mL Mouth Rinse q12n4p  . bisacodyl  5 mg Oral Once  . pantoprazole (PROTONIX) IV  40 mg Intravenous Q12H  . sodium chloride  10-40 mL Intracatheter Q12H   Continuous Infusions: . heparin 1,300 Units/hr (06/29/14 0635)   PRN Meds:.acetaminophen, fentaNYL (SUBLIMAZE) injection, ondansetron (ZOFRAN) IV, sodium chloride    ASSESSMENT AND PLAN: 69 year old male with h/o aortic root and aortic valve replacement at [facility] in 1993 with 25 mm St Jude valve conduit, who presented to [facility] 5/18 with acute CHF presumably sec to valve dysfunction.   1. 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  Appreciate CTS recommendations.  Needs aortic valve replacement but currently not a candidate for surgery until he is ambulatory with PT and his pressors weaned off.  Not on pressors. - Pt working with Pt, recs- inpatient rehab - addressing GI bleeding as below - Awaiting CVTS recs as to when surgery will be planned.  2. Cardiogenic Shock with diastolic CHF exacerbation- likely due to valve thrombosis.  BP stable. No signs of fluid overload.   3. Acute GI bleed with melena with gastric cardia erosions noted on EGD, 06/21/2014- GAstric erosions with ozing blood vessel, 4 gastric clips were used and epinephrine injected. S/p 7 units of blood since admission. Still having dark tarry stools- last bowel movement early this am, and fresh blood in stool noted yesterday. Hgb Stable at 8.2. - monitor CBC; transfuse for Hgb < 8; give IV lasix if transfusion needed - Cont BID PPI - Daily CBC  4. Chronic Atrial Fib:  Rate controlled, anticoag initially held due to bleeding. Now on heparin infusion for DVT - Cont Heparin ggt  5. Left upper Extremity DVT- On heparin infusion, was not bolused. - Cont heparin ggt  6. Thrombocytopenia:  Resolved. Plt today- 269.  7.  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 Jude mechanical valve-conduit. The coronary reconstruction was with a 8 mm Gore-Tex graft Cabral type repair in 1993.  8. Hypokalemia- 3.4 today. - Replete with 24meq of KCL.  69 year old male with prosthetic valve dysfunction/thrombosis with severe AI, admitted in cardiogenic shock, scheduled for a surgery but cancelled sec to GIB - s/p EGD with placement of gastric clips, now stable Hb and no blood in the stool despite heparin iv. DVT of the LUE has improved. He is euvolemic. Dr Nils Pyle wants the patient to be transferred to the inpatient rehab to regain strength before he operates on him. He was evaluated today and approved by [facility]. We will work on his discharge papers and continue to follow him on 4W.    Dorothy Spark 06/29/2014'

## 2014-06-29 NOTE — Discharge Instructions (Signed)
Aortic Valve Replacement   Aortic valve replacement is a procedure to replace an aortic valve that cannot be repaired. An artificial (prosthetic) valve is used to do this. Three types of prosthetic valves are available:   · Mechanical valves made entirely from man-made materials.    · Donor valves made from human donors. These are only used in special situations.    · Biological valves made from animal tissues.    The type of prosthetic valve used will be determined based on various factors, including your age, your lifestyle, and other medical conditions you have.   LET YOUR HEALTH CARE PROVIDER KNOW ABOUT:  · Any allergies you have.  · All medicines you are taking, including vitamins, herbs, eye drops, creams, and over-the-counter medicines.  · Previous problems you or members of your family have had with the use of anesthetics.  · Any blood disorders you have.  · Previous surgeries you have had.  · Medical conditions you have.  RISKS AND COMPLICATIONS  Generally, this is a safe procedure. However, as with any procedure, problems can occur. Possible problems include:   · Blood clotting caused by the new valve. Replacement with a mechanical valve requires lifelong treatment with medicine to prevent blood clots.    · Infection in the new valve.    · Valve failure.    · Bleeding.    · Reaction to anesthetics.    BEFORE THE PROCEDURE  · Ask your health care provider about:  ¨ Changing or stopping your regular medicines. This is especially important if you are taking diabetes medicines or blood thinners.  ¨ Taking medicines such as aspirin and ibuprofen. These medicines can thin your blood. Do not take these medicines before your procedure if your health care provider asks you not to.  · Do not eat or drink anything after midnight on the night before the procedure or as directed by your health care provider.  PROCEDURE   The surgeon may use either an open technique or a minimally invasive technique for this surgery:      Traditional open surgery  · You will be given a medicine that makes you fall asleep (general anesthetic).  · You will then be placed on a heart-lung bypass machine. This machine provides oxygen to your blood while the heart is undergoing surgery.  · During surgery, the surgeon will make a large cut (incision) in the chest.  · The heart will be cooled to slow or stop the heartbeat.  · The damaged aortic valve will be removed and replaced with a prosthetic heart valve.  · The incision will then be closed with staples or stitches.  Minimally invasive surgery  This is done through a smaller incision. This still requires general anesthetic and the heart-lung bypass machine. Your heart will be cooled to slow or stop the heartbeat, allowing the damaged valve to be removed and replaced with the new valve. The smaller incision will then be closed. If your condition allows for this procedure, there is often less blood loss, less pain, and faster recovery compared to traditional open surgery.   AFTER THE PROCEDURE  You will be monitored closely in a recovery area. From there, you will likely go to an intensive care unit.    Document Released: 06/03/2004 Document Revised: 05/29/2013 Document Reviewed: 06/21/2012  Exit[employer]® Patient Information ©2015 [employer]. This information is not intended to replace advice given to you by your health care provider. Make sure you discuss any questions you have with your health care provider.

## 2014-06-29 NOTE — Progress Notes (Signed)
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 He was tx to [facility] 5/19 from [place] with cardiogenic shock r/t severe aortic valve insufficiency/ partially thrombosed mechanical AoV after stopping coumadin for unclear reasons several months prior. scheduled for Aortic Valve REplacement, but surgery put on hold secondary to melena, endoscopy 5/26; Difficulty with respiratory recovery post, Critical Care on board; Cardiology would like for Mr. Caltagirone to be ambulatory prior to Aortic Valve Replacement    PT Comments    Pt willing to mobilize and ambulate today without persuasion but continues to benefit from encouragement and reassurance to maximize distance and increase function. Pt with bil calf pain in standing but vague and unchanged with activity but pt states that is his limiting factor with gait "I don't want to overdo it". Pt encouraged to continue to increase mobility and perform HEP throughout the day.   Follow Up Recommendations  CIR     Equipment Recommendations       Recommendations for Other Services       Precautions / Restrictions Precautions Precautions: Fall Restrictions Weight Bearing Restrictions: No    Mobility  Bed Mobility Overal bed mobility: Needs Assistance Bed Mobility: Rolling;Sidelying to Sit Rolling: Min guard Sidelying to sit: Min guard;HOB elevated       General bed mobility comments: cues for sequence, increased time, rail , HOB 20degrees  Transfers Overall transfer level: Needs assistance Equipment used: Rolling walker (2 wheeled) Transfers: Sit to/from Stand Sit to Stand: Min assist         General transfer comment: cues for hand placement and increased time and cuing for staying forward with his trunk for  sit<>stand  Ambulation/Gait Ambulation/Gait assistance: Min assist;+2 safety/equipment Ambulation Distance (Feet): 100 Feet Assistive device: Rolling walker (2 wheeled) Gait Pattern/deviations: Step-through pattern;Decreased stride length;Trunk flexed   Gait velocity interpretation: Below normal speed for age/gender General Gait Details: cues for posture, position in RW and to increase stride with chair pulled behind pt and encouragement to increase distance   Stairs            Wheelchair Mobility    Modified Rankin (Stroke Patients Only)       Balance Overall balance assessment: Needs assistance Sitting-balance support: No upper extremity supported;Feet supported Sitting balance-Leahy Scale: Fair       Standing balance-Leahy Scale: Poor                      Cognition Arousal/Alertness: Awake/alert Behavior During Therapy: Flat affect Overall Cognitive Status: Within Functional Limits for tasks assessed                 General Comments: slow to answer questions and slow to move    Exercises      General Comments        Pertinent Vitals/Pain Pain Assessment: Faces Pain Score: 4  Faces Pain Scale: Hurts little more Pain Location: bil calf pain Pain Descriptors / Indicators: Aching Pain Intervention(s): Limited activity within patient's tolerance;Repositioned  HR 110-135 with activity sats 98% on RA BP 144/90 with activity    Home Living Family/patient expects to be discharged to:: Inpatient rehab Living Arrangements: Children Available Help at Discharge: Family;Available PRN/intermittently (Pt also says there is a PCA there for his wife when his daughter is at work) Type of Home: [employer]  Home Access: Stairs to enter   Home Layout: One level   Additional Comments: Pt reports he sponge bathes    Prior Function Level of Independence: Independent (for B/D per pt report)          PT Goals (current goals can now be found in the care  plan section) Acute Rehab PT Goals Patient Stated Goal: Rehab then home before next surgery Progress towards PT goals: Progressing toward goals    Frequency       PT Plan Discharge plan needs to be updated    Co-evaluation PT/OT/SLP Co-Evaluation/Treatment: Yes Reason for Co-Treatment: For patient/therapist safety PT goals addressed during session: Mobility/safety with mobility;Proper use of DME;Balance       End of Session Equipment Utilized During Treatment: Gait belt Activity Tolerance: Patient tolerated treatment well Patient left: in chair;with call bell/phone within reach     Time: 0758-0823 PT Time Calculation (min) (ACUTE ONLY): 25 min  Charges:  $Gait Training: 8-22 mins                    G Codes:      [name] 02-Jul-2014, 10:02 AM [name], [place]

## 2014-06-29 NOTE — Discharge Summary (Signed)
Discharge Summary to Inpatient rehab   Patient ID: [name],  MRN: [id number], DOB/AGE: 1945-12-08 69 y.o.  Admit date: 06/14/2014 Discharge date: 06/29/2014  Primary Care Provider: YCXK,GYJEHU K Primary Cardiologist: Dr. Meda Coffee - saw [facility] in the past  Discharge Diagnoses Principal Problem:   Aortic valve insufficiency Active Problems:   Cardiogenic shock, possible   Hx of aortic aneurysm repair   Atrial fibrillation, do not know length of time   Elevated troponin   Positive D dimer   CHF exacerbation   Melena   Gastric erosion   Aortic stenosis   Acute respiratory failure, unspecified whether with hypoxia or hypercapnia   Altered mental status   Accelerated hypertension   Aortic insufficiency   Gastric erosion with bleeding   Primary gout   Allergies No Known Allergies  Procedures  Echocardiogram 06/14/2014 LV EF: 60% -  65%  ------------------------------------------------------------------- Indications:   Aortic insufficiency 424.1.  ------------------------------------------------------------------- History:  PMH:  Chest pain. Atrial fibrillation. Congestive heart failure. Aortic valve disease, post repair.  ------------------------------------------------------------------- Study Conclusions  - Left ventricle: The cavity size was normal. There was moderate concentric hypertrophy. Systolic function was normal. The estimated ejection fraction was in the range of 60% to 65%. Wall motion was normal; there were no regional wall motion abnormalities. - Aortic valve: A 25 mm St Jude mechanical conduit valve is present at the aortic position. There are imited views and it seems that only one leaflet is opening. Transaortic gradients are severeley elevated. There is moderate to severe aortic insufficiency, no paravalvular leak. There was severe stenosis. There was moderate to severe regurgitation. Mean gradient (S): 54 mm Hg.  Peak gradient (S): 88 mm Hg. Valve area (VTI): 0.42 cm^2. Valve area (Vmax): 0.36 cm^2. Valve area (Vmean): 0.37 cm^2. - Ascending aorta: The ascending aorta was normal in size. - Mitral valve: There was moderate regurgitation. - Left atrium: The atrium was severely dilated. - Right atrium: The atrium was moderately dilated. - Pulmonary arteries: Systolic pressure was moderately increased. PA peak pressure: 48 mm Hg (S).  Impressions:  - A 25 mm St Jude mechanical conduit valve is present at the aortic position. There are imited views and it seems that only one leaflet is opening. Transaortic gradients are severeley elevated. There is moderate to severe aortic insufficiency, no paravalvular leak.  LV size and function is normal. Filling pressures couldn&'t be estimated.  RV is moderately dilated with moderate systolic dysfunction and moderate pulmonary hypertension. RVSP 48 mmHg.   Flouroscopy of aortic valve 06/15/2014 Fluroscopy Mechanical 25 mm St Jude Aortic Valve  Multiple Angles in LAO/RAO with cranial and caudal angulation Both leaflets with limited mobility. One leaflet appears non mobile  Other leaflet appears to only have limited motion of 30 degrees or so  Patient has had surgical consult and going for TEE now Has not been on coumadin for over 3 months   TEE 06/15/2014  Results: Normal LV size and function with moderate LVH. EF 60% Normal RV size and function Moderately dilated RA Severely dilated LA Normal TV with moderate TR Normal MV with moderate MR Normal PV St. Jude Mechanical AVR is present. Only one of the leaflets can be visualized as moving and that movement is significantly reduced. The other leaflet is immobile. There is severe aortic insufficiency that is best appreciated in the 120 degree view.  Normal interatrial septum with ?of interatrial shunt by colorflow doppler. Agitated saline contrast was injected but poor  study. 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  The patient tolerated the procedure well and was transported back to his room in stable condition.   Left and Right Cardiac catheterization 06/19/2014  Prior ascending aortic dissection which prompted aortic valve replacement and bypass to the left main and RCA. The bypass appears widely patent.  Significant false lumen still present. There is some difficulty in getting to the coronary vessels and a versa core wire had to be used for this.  There does not appear to be any significant coronary artery disease in the major epicardial vessels. It is somewhat difficult to see the distal RCA system.  Essentially normal right heart pressures. Hardy 8 output 5.9 L/m. Cardiac index 2.8. pulmonary artery saturation 54%.  Plan for high risk aortic valve replacement. If further imaging is required, consider coronary CT to better visualize the anatomy.  Fick Cardiac Output  5.98 L/min    Fick Cardiac Output Index  2.81 (L/min)/BSA   RV Systolic Pressure  35 mmHg    RV Diastolic Pressure  4 mmHg   RV EDP  7 mmHg    PW Mean  11 mmHg       EGD 5/23 & 06/21/2014  ENDOSCOPIC IMPRESSION: 5/23 1. The mucosa of the esophagus appeared normal with mildly irregular Z-line 2. Two gastric cardia erosions without active bleeding, presumed cause of recent melena 3. The stomach otherwise appeared normal 4. Very mild bulbar duodenitis with normal examined 2nd part of the duodenum  RECOMMENDATIONS: 1. Twice daily PPI 2. Monitor for rebleeding if/when anticoagulation is resumed   ENDOSCOPIC IMPRESSION: 5/26 1. The mucosa of the esophagus appeared normal 2. Large blood clot and fresh blood removed from the proximal stomach with  irrigation, lavage, and Roth net 3. Gastric cardia erosion with visible vessel treated with epinephrine injection and hemostatic clip -4 (as above) 4. Unremarkable distal stomach 5. The duodenal mucosa showed no abnormalities   RECOMMENDATIONS: 1. Resume PPI drip for 24-48 hours 2. Closely monitor hemoglobin, transfuse if necessary 3. Critical care has been consulted for ventilator management as the patient will return to the ICU intubated and sedated     Hospital Course  Mr. 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 [name] who present to [facility] on 06/12/2016 complaining of SOB. Prior to his AVR in 1990s, he had a cardiac cath which showed normal coronaries. While at [facility], he was found to have severe aortic insufficiency and transferred to [facility] for further evaluation. TEE was done at [facility], unfortunately no record was sent along with the patient, report was concerning for valve failure. He has not seen by PCP for a year and stopped his medications for unknown reasons for 3 months. He was given diuretic however became hypotensive requiring dopamine. EKG showed atrial fibrillation. Laboratory finding at [facility] was significant for sodium 126, potassium 4.8, troponin 0.59, CK-MB 10.3, proBNP 13,873, d-dimer 6.48, TSH 2.5, magnesium 2.2. On initial exam, patient had JVD up to the jaw and the minimal crackle in lung bases along with minimal lower extremity edema, he did not appear to be in acute distress. IV Lasix was held on arrival to hypotension  and no clear sign of excessive volume overload.   It is unknown how long the patient has been in atrial fibrillation, given CHA2DS2-Vasc score of 3, IV heparin was started. Initially, it was not clear whether the patient had mechanical versus porcine valve, although he states it was a  pig valve, physical exam was more consistent with mechanical valve. Echocardiogram was obtained on 5/19 which showed EF 60-65%, 25 mm St. Jude mechanical conduit valve present at the aortic position, there was limited views, however it appears only one leaflet is opening, transaortic gradient severely elevated, there was severe stenosis and moderate to severe regurgitation, moderate MR, severely dilated left atrium, PA peak pressure 48. Given echo reported, it appears patient has mechanical aortic valve, however he was not on Coumadin (for at least 3 month) and does not remember any GI bleed in the past. His atrial fibrillation appears to be rate controlled. He underwent fluoroscopy on 06/15/2014 via multiple angles in LAO/RAO with cranial and caudal angulation. This study showed both leaflets with limited mobility with one leaflet appears to be nonmobile and the other leaflet appears to have only limited motion of 30. TEE showed St Jude mechanical AVR present with only one leaflet can be visualized as moving and movement was significantly reduced, while the other leaflet was immobile. There is severe aortic insufficiency. Questionable intraatrial septal shunt, however poor study. 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 CT surgery was consulted.   He was seen by CT surgery on 5/20, the prosthetic valve dysfunction was felt to be inadequate anticoagulation. IV Integrilin was added on top of IV heparin for at least 48-72 hours before attempt at repeat echocardiogram in the hope to dissolve clot to avoid AVR. However his prosthetic valve dysfunction persists, it was felt he would likely require repair of the current valve versus replacement. His hospitalization was complicated by cardiogenic shock with diastolic heart failure exacerbation in relation to the valve thrombosis. Central venous catheter and arterial line placed. Levothroid was  started on 5/21. Unfortunately, he started having maroon-colored stool with clots and hemoglobin drop on the same day. Heparin and integrilin were stopped and he was transfused with PRBC. He was seen by GI and treated with IV protonix. EGD was performed on 5/23 and mucosa of the esophagus appears to be normal however did note 2 gastric cardia erosion without active bleeding which was felt to be presumed cause of recent melena. Meanwhile CT surgery was considering the patient for high risk redo replacement procedure. He underwent left and right heart catheterization on 5/24 which showed minimal CAD, cardiac index 2.81, cardiac output 5.98, wedge pressure 11. On the day of the planned redo AVR on 5/26, patient had a rebleed therefore surgery was cancelled. He underwent repeat endoscopy on the same day which showed gastric cardia erosion with visible vessel treated with epinephrine injection and hemostatic clips 4, recommended resuming the PPI drip for 24-48 hours. The procedure was done under general anesthesia due to concern of aspiration, he was extubated on the following day. He was able to come off of neosynephrine on 5/28. He subsequently developed acute DVT in the left brachial and axillary vein diagnosed on U/S on 5/29. IV heparin was restarted without bolus. Despite no bolus with IV heparin, he had some fresh blood in stool on the following day. GI was reconsulted.   He was seen by CT surgery again on 06/27/2014, at which  time he was felt to be too weak to go through with the surgery. It was recommended for him to continue PT so he is strong enough to walk before redo aortic root replacement is considered. CIR is recommended to max out PT before surgery. He was seen by physical therapy who agreed with CIR recommendation. Given recurrent bleeding issue, he was given very lax on 6/1 to allow passing of old blood clot. He was seen on the following day, hemoglobin was stable, it was felt repeat EGD was not  necessary. He was seen in the morning of 06/29/2014, he has no new complaint. He continues to walk with PT although feel weak but no significant shortness breath and no chest pain. He is deemed stable to discharge to Inpatient rehab. He will continue IV protonix and IV heparin. Cardiology will continue to follow him on 4W.     Discharge Vitals Blood pressure 95/72, pulse 96, temperature 97.7 F (36.5 C), temperature source Oral, resp. rate 29, height 5\' 8"  (1.727 m), weight 220 lb 0.3 oz (99.8 kg), SpO2 100 %.  Filed Weights   06/27/14 0500 06/28/14 0500 06/29/14 0442  Weight: 218 lb 7.6 oz (99.1 kg) 216 lb 11.4 oz (98.3 kg) 220 lb 0.3 oz (99.8 kg)    Labs  CBC  Recent Labs  06/28/14 0500 06/29/14 0435  WBC 8.4 7.7  HGB 8.1* 8.1*  HCT 24.8* 25.5*  MCV 89.2 89.5  PLT 326 580   Basic Metabolic Panel  Recent Labs  06/28/14 0500 06/29/14 0435  NA 133* 133*  K 3.8 3.7  CL 102 102  CO2 22 22  GLUCOSE 95 97  BUN 11 10  CREATININE 1.04 1.04  CALCIUM 8.5* 8.6*   Liver Function Tests  Recent Labs  06/28/14 0500 06/29/14 0435  AST 26 25  ALT 18 17  ALKPHOS 49 49  BILITOT 0.7 0.5  PROT 6.1* 6.3*  ALBUMIN 2.5* 2.5*    Disposition  Pt is being discharged to inpatient rehab in good condition.  Follow-up Plans & Appointments      Follow-up Information    Follow up with Dorothy Spark, MD.   Specialty:  Cardiology   Why:  Please call cardiology to arrange 2-4 weeks followup once you leave the hospital.    Contact information:   [address] [place] [address] [phone]       Discharge Medications    Medication List    STOP taking these medications        olmesartan-hydrochlorothiazide 20-12.5 MG per tablet  Commonly known as:  BENICAR HCT      TAKE these medications        ferrous sulfate 325 (65 FE) MG EC tablet  Take 325 mg by mouth every other day.     heparin 100-0.45 UNIT/ML-% infusion  Inject 1,300 Units/hr into  the vein continuous.     ondansetron 4 MG/2ML Soln injection  Commonly known as:  ZOFRAN  Inject 2 mLs (4 mg total) into the vein every 6 (six) hours as needed for nausea or vomiting.     pantoprazole 40 MG injection  Commonly known as:  PROTONIX  Inject 40 mg into the vein every 12 (twelve) hours.         Duration of Discharge Encounter   Greater than 30 minutes including physician time.  Hilbert Corrigan PA-C Pager: [phone] 06/29/2014, 1:26 PM

## 2014-06-29 NOTE — Interval H&P Note (Signed)
[name] was admitted today to Inpatient Rehabilitation with the diagnosis of debility related to AVI and multiple medical.  The patient's history has been reviewed, patient examined, and there is no change in status.  Patient continues to be appropriate for intensive inpatient rehabilitation.  I have reviewed the patient's chart and labs.  Questions were answered to the patient's satisfaction. The PAPE has been reviewed and assessment remains appropriate.  [name] T 06/29/2014, 5:17 PM

## 2014-06-29 NOTE — PMR Pre-admission (Signed)
PMR Admission Coordinator Pre-Admission Assessment  Patient: [name] is an 69 y.o., male MRN: [id number] DOB: [birth year] Height: 5\' 8"  (172.7 cm) Weight: 99.8 kg (220 lb 0.3 oz)              Insurance Information HMO: yes    PPO:      PCP:      IPA:      80/20:      OTHER: medicare replacement PRIMARY: [employer] Silverback      Policy#: [id number]      Subscriber: pt CM Name: Amy      Phone#: [phone]     Fax#: [phone] Pre-Cert#: [id number]      Employer: retired approved 6/3 until 07/03/2014 Benefits:  Phone #: [phone]     Name: 06/28/14 Eff. Date: 01/26/14     Deduct: none      Out of Pocket Max: $5500      Life Max: none CIR: $275 per day days 1-7 then covers 100%      SNF: no copay days 1-20; $160 copay per day days 21-100 Outpatient: $40 copay per visit     Co-Pay: no visit limit Home Health: 100%      Co-Pay: no visit limit DME: 80%     Co-Pay: 20% Providers: in network  SECONDARY: none        Medicaid Application Date:       Case Manager:  Disability Application Date:       Case Worker:   Emergency Facilities manager Information    Name Relation Home Work Mobile   [place] Daughter [phone]     [place], [place] [phone]       Current Medical History  Patient Admitting Diagnosis: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 right handed male with history of aortic valve repair in the setting of aortic aneurysm in the 1990s by Dr. [name] and recently stopped Coumadin secondary to wall no ulcer was significant bleeding treated at [place] in August 2014. Patient lives with his medically disabled wife and daughter used a cane prior to admission.   Presented 06/14/2014 to [name] complaining of shortness of breath with cardiogenic shock found to have severe aortic valve insufficiency and was transferred to [facility] for further  evaluation. Placed on intravenous heparin. TEE completed showing ejection fraction of 60%. Only one of the leaflets could be visualized as moving movement was significantly reduced. There was severe aortic insufficiency. 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 Cardiac catheterization showed no significant CAD. Cardiothoracic surgery consulted for prosthetic valve dysfunction and need for surgical intervention. Hospital course complicated by GI bleed/melena with gastroenterology consulted 06/17/2014. EGD completed 06/18/2014 showing 2 gastric cardia erosions without active bleeding. Patient has been transfused during hospital course. On 06/21/2014 with increased loose tarry stools EGD repeated 06/21/2014 showing large gastric blood clot with erosion visible inferior to the GE junction. Course complicated 36/46/8032 after venous Doppler study showed acute DVT left brachial and axillary veins. Patient currently remains on heparin therapy. Currently the plan by cardiothoracic surgery is for ongoing physical therapy for strengthening conditioning and plan redo aortic root replacement in approximately 1-2 weeks.   Past Medical History  Past Medical History  Diagnosis Date  . Chest pain   . Hypertension   . Aortic aneurysm 1993  . 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 Pneumonia   . Aortic valve replaced      At [facility]. developed thrombosis of valve  . Hx of aortic aneurysm repair 06/14/2014  . Atrial fibrillation 05/2014  . Gastritis and gastroduodenitis with hemorrhage 05/2014    severe.   . Tubular adenoma of colon 12/2012  . DVT of upper extremity (deep vein thrombosis) 05/2014    left  . Acute blood loss anemia 05/2014     Family History  family history includes Diabetes (age of onset: 61) in his mother; Hypertension (age of onset: 17) in his father. There is no history of Colon cancer.  Prior  Rehab/Hospitalizations:  Has the patient had major surgery during 100 days prior to admission? No   Current Medications   Current facility-administered medications:  .  acetaminophen (TYLENOL) tablet 650 mg, 650 mg, Oral, Q6H PRN, Dorothy Spark, MD .  antiseptic oral rinse (CPC / CETYLPYRIDINIUM CHLORIDE 0.05%) solution 7 mL, 7 mL, Mouth Rinse, q12n4p, Juanito Doom, MD, 7 mL at 06/29/14 1200 .  bisacodyl (DULCOLAX) EC tablet 5 mg, 5 mg, Oral, Once, Ivin Poot, MD, 5 mg at 06/21/14 0500 .  fentaNYL (SUBLIMAZE) injection 12.5-25 mcg, 12.5-25 mcg, Intravenous, Q2H PRN, Juanito Doom, MD, 12.5 mcg at 06/28/14 0235 .  heparin ADULT infusion 100 units/mL (25000 units/250 mL), 1,300 Units/hr, Intravenous, Continuous, Franky Macho, [facility], Last Rate: 13 mL/hr at 06/29/14 0635, 1,300 Units/hr at 06/29/14 0635 .  ondansetron (ZOFRAN) injection 4 mg, 4 mg, Intravenous, Q6H PRN, Dorothy Spark, MD .  pantoprazole (PROTONIX) injection 40 mg, 40 mg, Intravenous, Q12H, Francesca Oman, DO, 40 mg at 06/29/14 1000 .  sodium chloride 0.9 % injection 10-40 mL, 10-40 mL, Intracatheter, Q12H, Dorothy Spark, MD, 30 mL at 06/29/14 1000 .  sodium chloride 0.9 % injection 10-40 mL, 10-40 mL, Intracatheter, PRN, Dorothy Spark, MD  Facility-Administered Medications Ordered in Other Encounters:  .  fentaNYL (SUBLIMAZE) injection, , , Anesthesia Intra-op, Rokoshi T Flowers, CRNA, 50 mcg at 06/21/14 0700 .  lactated ringers infusion, , , Continuous PRN, Rokoshi T Flowers, Immunologist .  midazolam (VERSED) 5 MG/5ML injection, , , Anesthesia Intra-op, Rokoshi T Flowers, CRNA, 1 mg at 06/21/14 0700 .  phenylephrine (NEO-SYNEPHRINE) injection, , , Anesthesia Intra-op, Rokoshi T Flowers, CRNA, 40 mcg at 06/21/14 0725  Patients Current Diet: DIET SOFT Room service appropriate?: Yes; Fluid consistency:: Thin  Precautions / Restrictions Precautions Precautions: Fall Precaution Comments:  (Bilateral knee pain  with any ROM on initial PT eval) Restrictions Weight Bearing Restrictions: No   Has the patient had 2 or more falls or a fall with injury in the past year?No  Prior Activity Level Limited Community (1-2x/wk): gradual decline over past 6 months. Used cane . Sponge baths for past 6 months due to fatiguability. Some SOB.  Home Assistive Devices / Equipment Home Assistive Devices/Equipment: Cane (specify quad or straight) Home Equipment: Other (comment) (to be determined)  Prior Device Use: Indicate devices/aids used by the patient prior to current illness, exacerbation or injury? cane  Prior Functional Level Prior Function Level of Independence: Independent with assistive device(s) (cane) Comments: n/a  Gradual decline in function over past 6 months. Used a cane . Could walk to mail box but became fatigued. Sponge bathes due to fatiguability.  Self Care: Did the patient need help bathing, dressing, using the toilet or eating?  Independent But sponge baths only  Indoor Mobility: Did the patient need assistance with  walking from room to room (with or without device)? Independent  Stairs: Did the patient need assistance with internal or external stairs (with or without device)? Independent  Functional Cognition: Did the patient need help planning regular tasks such as shopping or remembering to take medications? Dependent  Current Functional Level Cognition  Overall Cognitive Status: Within Functional Limits for tasks assessed Orientation Level: Oriented X4 General Comments: slow to answer questions and slow to move    Extremity Assessment (includes Sensation/Coordination)  Upper Extremity Assessment: Overall WFL for tasks assessed (Decreased AROM at shoulders while seated in recliner--can get more with PROM to about 160 degrees of flexion) LUE Deficits / Details: 5/5 hand grip strength LUE: Unable to fully assess due to pain  Lower Extremity Assessment: RLE deficits/detail, LLE  deficits/detail RLE Deficits / Details: Significant grimace with any movement, especially painful with knee flexion as his LEs cleared teh EOB; provided support to both lower legs to be able to reach EOB successfully RLE: Unable to fully assess due to pain LLE Deficits / Details: Significant grimace with any movement, especially painful with knee flexion as his LEs cleared teh EOB; provided support to both lower legs to be able to reach EOB successfully LLE: Unable to fully assess due to pain    ADLs  Overall ADL's : Needs assistance/impaired Eating/Feeding: Set up, Sitting Grooming: Set up, Sitting Upper Body Bathing: Sitting, Minimal assitance Lower Body Bathing: Maximal assistance (with min A +2 sit<>stand with increased time and cuing for staying forward with his trunk for sti<>stand) Upper Body Dressing : Moderate assistance, Sitting Lower Body Dressing:  (with min A sit<>stand with increased time and cuing for staying forward with his trunk for sit<>stand) Toilet Transfer: Minimal assistance, Ambulation, RW (with min A sit<>stand with increased time and cuing for staying forward with his trunk for sit<>stand) Toileting- Clothing Manipulation and Hygiene:  (with min sit<>stand with increased time and cuing for staying forward with his trunk for sit<>stand) General ADL Comments: Every activity takes increased time due to pt moves slowly    Mobility  Overal bed mobility: Needs Assistance Bed Mobility: Rolling, Sidelying to Sit Rolling: Min guard Sidelying to sit: Min guard, HOB elevated Supine to sit: Min assist Sit to supine: Mod assist, +2 for safety/equipment General bed mobility comments: cues for sequence, increased time, rail , HOB 20degrees    Transfers  Overall transfer level: Needs assistance Equipment used: Rolling walker (2 wheeled) Transfers: Sit to/from Stand Sit to Stand: Min assist General transfer comment: cues for hand placement and increased time and cuing for  staying forward with his trunk for sit<>stand    Ambulation / Gait / Stairs / Wheelchair Mobility  Ambulation/Gait Ambulation/Gait assistance: Min assist, +2 safety/equipment Ambulation Distance (Feet): 100 Feet Assistive device: Rolling walker (2 wheeled) General Gait Details: cues for posture, position in RW and to increase stride with chair pulled behind pt and encouragement to increase distance Gait Pattern/deviations: Step-through pattern, Decreased stride length, Trunk flexed Gait velocity interpretation: Below normal speed for age/gender    Posture / Balance Balance Overall balance assessment: Needs assistance Sitting-balance support: No upper extremity supported, Feet supported Sitting balance-Leahy Scale: Fair Standing balance-Leahy Scale: Poor    Special needs/care consideration Continuous Drip IV heparin ? Pharmacy dosing Bowel mgmt: 06/28/14 black smear Bladder mgmt: condom catheter    Previous Home Environment Living Arrangements: Spouse/significant other, Children (lives with wife, dtr [name] and son [name])  Lives With: Spouse, Family, Son, Daughter Available Help at Discharge: Family, Available  24 hours/day (dtr, [name] works nights and son [name] unemployed) Type of Home: House Home Layout: One level Home Access: Stairs to enter [employer] of Steps:  (to be determined) Bascom: No Additional Comments: Pt reports he sponge bathes Wife with h/o renal transplant and is bedridden from crippling arthritis. Wife has an aide 8 am until 1 pm while dtr, [name], sleeps ( she works third shift). Angela cares for Mom 3 until 6 then goes to work. [name], son, 36 yo and unemployed. Also Angela's 56 yo son in the home. [name] states [name] would be available to provide for pt 24/7.  Discharge Living Setting Plans for Discharge Living Setting: Patient's home, Lives with (comment), Other (Comment) (wife, dtr and son) Type of Home at Discharge:  House Discharge Home Layout: One level Discharge Home Access: Stairs to enter Does the patient have any problems obtaining your medications?: No  Social/Family/Support Systems Patient Roles: Spouse, Parent Contact Information: Fransico Setters, dtr Anticipated Caregiver: [name] and [name], dtr and son. Cherie is dtr here in [employer] Anticipated Caregiver's Contact Information: see above Ability/Limitations of Caregiver: [name] works nights. [name] unemployed. Wife is bedrridden and 31 yo grandson Caregiver Availability: 24/7 Discharge Plan Discussed with Primary Caregiver: Yes Is Caregiver In Agreement with Plan?: Yes Does Caregiver/Family have Issues with Lodging/Transportation while Pt is in Rehab?: No   Goals/Additional Needs Patient/Family Goal for Rehab: supervision with PT and OT Expected length of stay: ELOS 9-14 days. Goal to prepare pt functionally to be readmitted to acute for redo valavle surgery, or to get to supervision level and d/c home wiht family and await surgery at a later date as defined by Dr. Prescott Gum Equipment Needs: IV heparin to bridge to coumadin vs to bridge until surgery Pt/Family Agrees to Admission and willing to participate: Yes Program Orientation Provided & Reviewed with Pt/Caregiver Including Roles  & Responsibilities: Yes  Plan is to rehab pt to point that Dr. Prescott Gum to plan surgery once d/c from CIR vs pt to d/c home to further recuperate and prepare for surgery at a later date. I have discussed with both pt and his daughter, [name] of two d/c options.  Decrease burden of Care through IP rehab admission: n/a  Possible need for SNF placement upon discharge:not anticipated  Patient Condition: This patient's condition remains as documented in the consult dated 06/27/2104, in which the Rehabilitation Physician determined and documented that the patient's condition is appropriate for intensive rehabilitative care in an inpatient rehabilitation facility. Will  admit to inpatient rehab today.  Preadmission Screen Completed By:  Cleatrice Burke, 06/29/2014 1:01 PM ______________________________________________________________________   Discussed status with Dr. Naaman Plummer on 06/29/2014 at  1258 and received telephone approval for admission today.  Admission Coordinator:  Cleatrice Burke, time 0174 Date 06/29/2014.

## 2014-06-30 ENCOUNTER — Inpatient Hospital Stay (HOSPITAL_COMMUNITY): Payer: Commercial Managed Care - HMO | Admitting: Occupational Therapy

## 2014-06-30 ENCOUNTER — Inpatient Hospital Stay (HOSPITAL_COMMUNITY): Payer: Commercial Managed Care - HMO | Admitting: Physical Therapy

## 2014-06-30 DIAGNOSIS — I4891 Unspecified atrial fibrillation: Secondary | ICD-10-CM

## 2014-06-30 DIAGNOSIS — R5381 Other malaise: Secondary | ICD-10-CM

## 2014-06-30 DIAGNOSIS — I351 Nonrheumatic aortic (valve) insufficiency: Secondary | ICD-10-CM

## 2014-06-30 LAB — HEPARIN LEVEL (UNFRACTIONATED): HEPARIN UNFRACTIONATED: 0.34 [IU]/mL (ref 0.30–0.70)

## 2014-06-30 MED ORDER — SODIUM CHLORIDE 0.9 % IJ SOLN
10.0000 mL | INTRAMUSCULAR | Status: DC | PRN
  Administered 2014-06-30: 30 mL
  Administered 2014-07-03: 10 mL
  Filled 2014-06-30: qty 40

## 2014-06-30 NOTE — Significant Event (Signed)
Rapid Response Event Note  Overview:  Called to help assess patient s/p therapy who became diaphoretic and drop in O2 sat.  Reportedly in the 80's.  Patient was having a bowel movement prior to the event and reported straining.     Initial Focused Assessment: On arrival the patient was resting comfortably in bed with no complaints. On 4LNC with O2 sat 100 BP 104/78 HR 99 RR 18  Interventions: Weaned O2.  Assessed B pulses in upper extremities and the MD was notified- Dr Asa Lente   Event Summary: Patient was weaned back to RA with O2 sat 97% with no reported complaints.  Bedside RN to con't to monitor             Velta Addison, Gara Kroner

## 2014-06-30 NOTE — Progress Notes (Addendum)
Occupational Therapy Session Note  Patient Details  Name: CYREE CHUONG MRN: [id number] Date of Birth: October 10, 1945  Today's Date: 06/30/2014 OT Missed Time:    30 min Missed Time Reason:   due to medical issues    Short Term Goals: Week 1:     Skilled Therapeutic Interventions/Progress Updates:    Therapy on hold for afternoon due to medical issues.    Therapy Documentation Precautions:  Precautions Precautions: Fall Restrictions Weight Bearing Restrictions: No      Pain: Pain Assessment Pain Assessment: No/denies pain      See FIM for current functional status  Therapy/Group: individual therapy  Lisa Roca 06/30/2014, 6:28 PM

## 2014-06-30 NOTE — Progress Notes (Signed)
RN called to room by PT for change in VS.  BP low and O2 sats <90%. Patient appears diaphoretic and cool to the touch.  Denies pain or SOB. Patient returned to bed for rest. 2L-O2 placed via [place], Sats continue to be <90%. Oxygen bumped to 4L-[place] and Rapid Response notified at 1439 for further evaluation.  MD also notified of changes at 1450. 1455 VSS and patient O2 sats return to baseline on room air. Supplemental oxygen discontinued. Bed rest ordered for the remainder of the day until further notice.  All therapies to be held today per MD.  Nursing to continue monitoring.  Rapid Response RN suspects vagal response.

## 2014-06-30 NOTE — Progress Notes (Signed)
[name] is a 69 y.o. male Mar 21, 1945 [id number]  Subjective: No new complaints. No new problems. Slept well. Feeling OK.  Objective: Vital signs in last 24 hours: Temp:  [97.7 F (36.5 C)-97.8 F (36.6 C)] 97.8 F (36.6 C) (06/04 0536) Pulse Rate:  [91-93] 92 (06/04 0814) Resp:  [16-29] 16 (06/04 0536) BP: (95-140)/(72-92) 112/80 mmHg (06/04 0814) SpO2:  [97 %-100 %] 99 % (06/04 0814) Weight:  [224 lb 9.6 oz (101.878 kg)] 224 lb 9.6 oz (101.878 kg) (06/03 1807) Weight change:  Last BM Date: 06/29/14  Intake/Output from previous day: 06/03 0701 - 06/04 0700 In: 185 [P.O.:120; I.V.:65] Out: 1250 [Urine:1250]  Physical Exam General: No apparent distress   In [facility] Lungs: Normal effort. Lungs clear to auscultation, no crackles or wheezes. Cardiovascular: irregular rate and rhythm, no edema Musculoskeletal:  Neurovascularly intact   Lab Results: BMET    Component Value Date/Time   NA 133* 06/29/2014 0435   K 3.7 06/29/2014 0435   CL 102 06/29/2014 0435   CO2 22 06/29/2014 0435   GLUCOSE 97 06/29/2014 0435   BUN 10 06/29/2014 0435   CREATININE 1.04 06/29/2014 0435   CALCIUM 8.6* 06/29/2014 0435   GFRNONAA >60 06/29/2014 0435   GFRAA >60 06/29/2014 0435   CBC    Component Value Date/Time   WBC 7.7 06/29/2014 0435   RBC 2.85* 06/29/2014 0435   HGB 8.1* 06/29/2014 0435   HGB 7.4 11/10/2012   HCT 25.5* 06/29/2014 0435   HCT 24 11/10/2012   PLT 361 06/29/2014 0435   MCV 89.5 06/29/2014 0435   MCH 28.4 06/29/2014 0435   MCHC 31.8 06/29/2014 0435   RDW 17.5* 06/29/2014 0435   LYMPHSABS 1.1 06/23/2014 0400   MONOABS 1.8* 06/23/2014 0400   EOSABS 0.2 06/23/2014 0400   BASOSABS 0.1 06/23/2014 0400   CBG's (last 3):  No results for input(s): GLUCAP in the last 72 hours. LFT's Lab Results  Component Value Date   ALT 17 06/29/2014   AST 25 06/29/2014   ALKPHOS 49 06/29/2014   BILITOT 0.5 06/29/2014    Studies/Results: No results  found.  Medications:  I have reviewed the patient's current medications. Scheduled Medications: . antiseptic oral rinse  7 mL Mouth Rinse q12n4p  . pantoprazole (PROTONIX) IV  40 mg Intravenous Q12H   PRN Medications: acetaminophen, ondansetron **OR** ondansetron (ZOFRAN) IV, sorbitol  Assessment/Plan: Principal Problem:   Debility Active Problems:   Aortic valve insufficiency   Atrial fibrillation, do not know length of time   Accelerated hypertension  1. Functional deficits secondary to debilitation due to acute aortic valve insufficiency, multi-medical issues. Cardiothoracic surgery plan follow-up for surgical intervention in next few weeks 2. DVT Prophylaxis/Anticoagulation: Acute DVT left brachial and axillary veins. Currently on intravenous heparin. Await plan for long-term anticoagulation (complicated by recent GIB issues) 3. Pain Management: Tylenol as needed 4. GI bleed. Follow-up gastroenterology services. Transfuse if symptomatic. Continue PPI 5. Neuropsych: This patient is capable of making decisions on his own behalf. 6. Skin/Wound Care: Routine skin checks 7. Fluids/Electrolytes/Nutrition: Routine I&O with follow-up chemistries. 8. Chronic atrial fibrillation. Rate controlled. Intravenous heparin for now for acute DVT. Conversion to oral or long term plans for anticoagulation on hold at present due to GI bleed 9. ABLA - Hgb stable - monitor while on IV heparin and recent GIB   Length of stay, days: 1   Valerie A. Asa Lente, MD 06/30/2014, 11:27 AM

## 2014-06-30 NOTE — Evaluation (Signed)
Physical Therapy Assessment and Plan  Patient Details  Name: [name] MRN: [id number] Date of Birth: 1945-07-24  PT Diagnosis: Abnormal posture, Cognitive deficits, Coordination disorder, Difficulty walking, Edema, Impaired cognition, Impaired sensation and Muscle weakness Rehab Potential: Fair ELOS: 10-14 days   Today's Date: 06/30/2014 PT Individual Time:  -  Treatment Session 1: 800-900 Treatment Session [id number] Treatment Time: 60 min Treatment Session 2: 25 min  General: 15 minutes missed PT due to pt feeling ill.       Problem List:  Patient Active Problem List   Diagnosis Date Noted  . Debility 06/29/2014  . Primary gout   . Gastric erosion with bleeding   . Acute respiratory failure, unspecified whether with hypoxia or hypercapnia   . Altered mental status   . Accelerated hypertension   . Aortic insufficiency   . Aortic stenosis   . Melena   . Gastric erosion   . Aortic valve insufficiency 06/14/2014  . Cardiogenic shock, possible 06/14/2014  . Hx of aortic aneurysm repair 06/14/2014  . Atrial fibrillation, do not know length of time 06/14/2014  . Elevated troponin 06/14/2014  . Positive D dimer 06/14/2014  . CHF exacerbation   . Encounter for screening colonoscopy 11/30/2012  . Duodenal ulcer 11/30/2012  . AORTIC VALVE DISORDERS 01/13/2010  . 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 OTHER CHEST PAIN 01/13/2010  . OTHER ACQUIRED ABSENCE OF ORGAN 01/13/2010    Past Medical History:  Past Medical History  Diagnosis Date  . Chest pain   . Hypertension   . Aortic aneurysm 1993  . 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 Pneumonia   . Aortic valve replaced      05/2014 transfered [place] to [facility] for HF in the setting of failed valve repair. 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 CT surgery considering AVR, delayed due to GI bleed  . Hx of aortic aneurysm repair 06/14/2014  . Atrial fibrillation 05/2014  .  Gastritis and gastroduodenitis with hemorrhage 05/2014    severe.   . Tubular adenoma of colon 12/2012  . DVT of upper extremity (deep vein thrombosis) 05/2014    left  . Acute blood loss anemia 05/2014   . CAD (coronary artery disease)     minimal CAD on cath 05/2014   Past Surgical History:  Past Surgical History  Procedure Laterality Date  . Abdominal aortic aneurysm repair  1993    At [facility], Unspecified  . Splenectomy    . Cardiac valve replacement      At [facility]  . Esophagogastroduodenoscopy  Aug 2014    [facility]: large duodenal ulcer with visible vessel, s/p clip and epi, erosive gastritis. +h.pylori serology, treated with amoxicillin and biaxin.   . Colonoscopy N/A 12/27/2012    Procedure: COLONOSCOPY;  Surgeon: Danie Binder, MD;  [facility];  Service: Endoscopy;  Laterality: N/A;  8:45-moved to 855   . Tee without cardioversion N/A 06/15/2014    Procedure: TRANSESOPHAGEAL ECHOCARDIOGRAM (TEE);  Surgeon: Sueanne Margarita, MD;  Location: [facility] ENDOSCOPY;  Service: Cardiovascular;  Laterality: N/A;  . Cardiac catheterization N/A 06/15/2014    Procedure: Fluoroscopy Guidance;  Surgeon: Josue Hector, MD;  Location: [place] CV LAB;  Service: Cardiovascular;  Laterality: N/A;  . Esophagogastroduodenoscopy N/A 06/18/2014    Procedure: ESOPHAGOGASTRODUODENOSCOPY (EGD);  Surgeon: Jerene Bears, MD;  Location: [facility] ENDOSCOPY;  Service: Endoscopy;  Laterality: N/A;  . Peripheral vascular catheterization  06/19/2014    Procedure: Aortic Arch Angiography;  Surgeon: Jettie Booze, MD;  Location: [place] CV LAB;  Service: Cardiovascular;;  . Esophagogastroduodenoscopy N/A 06/21/2014    Procedure: ESOPHAGOGASTRODUODENOSCOPY (EGD);  Surgeon: Jerene Bears, MD;  Location: [facility] ENDOSCOPY;  Service: Endoscopy;  Laterality: N/A;  . Esophagogastroduodenoscopy N/A 06/21/2014    Procedure: ESOPHAGOGASTRODUODENOSCOPY (EGD);  Surgeon: Jerene Bears, MD;  Location: [place];   Service: Gastroenterology;  Laterality: N/A;    Assessment & Plan Clinical Impression: [name] is a 69 y.o. 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 [name] and recently stopped Coumadin secondary to wall no ulcer was significant bleeding treated at [name] in August 2014. Patient lives with his medically disabled wife and daughter used a cane prior to admission. Presented 06/14/2014 to [facility] complaining of shortness of breath with cardiogenic shock found to have severe aortic valve insufficiency and was transferred to [facility] for further evaluation. Placed on intravenous heparin. TEE completed showing ejection fraction of 60%. Only one of the leaflets could be visualized as moving movement was significantly reduced. There was severe aortic insufficiency. 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 Cardiac catheterization showed no significant CAD. Cardiothoracic surgery consulted for prosthetic valve dysfunction and need for surgical intervention. Hospital course complicated by GI bleed/melena with gastroenterology consulted 06/17/2014. EGD completed 06/18/2014 showing 2 gastric cardia erosions without active bleeding. Patient has been transfused during hospital course. On 06/21/2014 with increased loose tarry stools EGD repeated 06/21/2014 showing large gastric blood clot with erosion visible inferior to the GE junction. Course complicated [id number] after venous Doppler study showed acute DVT left brachial and axillary veins. Patient currently remains on heparin therapy. Currently the plan by cardiothoracic surgery is for ongoing physical therapy for strengthening conditioning and plan redo aortic root replacement in approximately 1-2 weeks. Physical therapy evaluation completed and ongoing.  Patient  transferred to [facility] on 06/29/2014 .   Patient currently requires total with mobility secondary to muscle weakness, decreased cardiorespiratoy endurance, impaired timing and sequencing, decreased coordination and decreased motor planning, decreased initiation, decreased awareness, decreased problem solving, decreased safety awareness, decreased memory and delayed processing and decreased sitting balance, decreased standing balance and decreased postural control.  Prior to hospitalization, patient was modified independent  with mobility and lived with Spouse, Family, Son, Daughter (grandson 42 y/o) in a House home.  Home access is  Ramped entrance.  Patient will benefit from skilled PT intervention to maximize safe functional mobility, minimize fall risk and decrease caregiver burden for planned discharge home with 24 hour assist.  Anticipate patient will benefit from follow up [facility] at discharge.     Skilled Therapeutic Intervention Treatment Session 1: PT Evaluation: Pt presents with generalized weakness after being diagnosed with aortic valve insufficiency. Pt has a PMH significant for AVR after aortic aneurysm and a-fib, req a re-do aortic root replacement in 1-2 weeks. Hospital stay has been complicated by GI bleed and DVTS in L brachial and axillary veins. Pt presents with significant cognitive & motor delays, difficulty with all functional mobility, and generalized weakness. Pt will benefit from IPR PT.   Threapeutic Activity: PT instructs pt in rolling R and L in flat bed req min A, L side lie to sit transfer req mod A, sit to stand req +2 assist with RW,  but then min A to stand-step transfer with RW bed to w/c.   W/C Management: PT instructs pt in w/c propulsion x 80' with B UEs req SBA and verbal cues for technique. Pt req assist for w/c parts management.   Treatment Session 2: Therapeutic Activity: Pt received up in w/c, looking sweaty, vitals measured and SaO2 noted to be at 84% on RA at  rest. PT immediately notifies RN who puts pt on supplemental oxygen and then PT and RN assist pt back to bed req 2 person mod A sit to stand and min A stand-pivot transfer w/c to bed. PT lifts pt's legs req mod A sit to supine.   Pt missed 15 minutes PT in PM due to low SaO2 on room air. RN called who put pt on supplemental oxygen. Once pt assisted back to bed, PT stopped for the day. Continue per PT POC as pt is medically stable.    PT Evaluation Precautions/Restrictions Precautions Precautions: Fall Restrictions Weight Bearing Restrictions: No General Chart Reviewed: Yes Family/Caregiver Present: No Vital SignsTherapy Vitals Temp: 97.8 F (36.6 C) Temp Source: Oral Pulse Rate: 92 Resp: 16 BP: 112/80 mmHg Patient Position (if appropriate): Lying (HOB elevated) Oxygen Therapy SpO2: 99 % O2 Device: Not Delivered Pain Pain Assessment Pain Assessment: No/denies pain  Treatment Session 2: Pt denies pain  Home Living/Prior Functioning Home Living Available Help at Discharge: Family;Available 24 hours/day Type of Home: House Home Access: Ramped entrance Home Layout: One level Additional Comments: Wife is bedridden, per patient  Lives With: Spouse;Family;Son;Daughter (grandson 71 y/o) Prior Function Level of Independence: Requires assistive device for independence  Able to Take Stairs?: No Driving: No Vocation: Retired Vision/Perception    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 Cognition Overall Cognitive Status: Within Functional Limits for tasks assessed Arousal/Alertness: Awake/alert Orientation Level: Oriented X4 Attention: Focused Focused Attention: Appears intact Memory: Impaired Memory Impairment: Decreased recall of new information;Decreased short term memory Decreased Short Term Memory: Verbal basic Awareness: Impaired Awareness Impairment: Emergent impairment Problem Solving: Impaired Problem Solving Impairment: Functional basic Behaviors: Other (comment)  (delayed processing) Sensation Sensation Light Touch: Impaired Detail Light Touch Impaired Details: Impaired LUE;Impaired RUE Stereognosis: Not tested Hot/Cold: Not tested Proprioception: Appears Intact Additional Comments: tingly fingertips Coordination Gross Motor Movements are Fluid and Coordinated: No Fine Motor Movements are Fluid and Coordinated: Not tested Coordination and Movement Description: slowed movements, slight tremors in hands, delayed processing Motor  Motor Motor: Abnormal tone;Abnormal postural alignment and control Motor - Skilled Clinical Observations: slight intention tremors in hands, slouched sitting posture with forward head  Mobility Bed Mobility Bed Mobility: Rolling Left;Left Sidelying to Sit Rolling Left: 4: Min assist Rolling Left Details: Manual facilitation for placement;Verbal cues for technique Rolling Left Details (indicate cue type and reason): hand placement Left Sidelying to Sit: 3: Mod assist Left Sidelying to Sit Details: Manual facilitation for placement;Verbal cues for technique Left Sidelying to Sit Details (indicate cue type and reason): hand placement Transfers Transfers: Yes Sit to Stand: 1: +2 Total assist;From bed Sit to Stand Details: Manual facilitation for placement Sit to Stand Details (indicate cue type and reason): Pt fearful and unable to stand with one person assist Stand to Sit: 4: Min guard;With armrests Stand Pivot Transfers: 4: Min assist Stand Pivot Transfer Details: Manual facilitation for placement;Verbal cues for technique Stand Pivot Transfer Details (indicate cue type and reason): shuffle steps Locomotion  Wheelchair Mobility Wheelchair Mobility: Yes Wheelchair Assistance: 5: Supervision Wheelchair Assistance Details: Verbal cues for  technique Environmental health practitioner: Both upper extremities Wheelchair Parts Management: Needs assistance Distance: 80   Balance Balance Balance Assessed: Yes Static Sitting  Balance Static Sitting - Balance Support: Feet supported;Bilateral upper extremity supported Static Sitting - Level of Assistance: 5: Stand by assistance Dynamic Sitting Balance Dynamic Sitting - Balance Support: Feet supported;Bilateral upper extremity supported Dynamic Sitting - Level of Assistance: 4: Min assist Static Standing Balance Static Standing - Balance Support: During functional activity;Bilateral upper extremity supported Static Standing - Level of Assistance: 4: Min assist Dynamic Standing Balance Dynamic Standing - Balance Support: During functional activity;Bilateral upper extremity supported Dynamic Standing - Level of Assistance: 4: Min assist Extremity Assessment  RUE Assessment RUE Assessment: Exceptions to [facility] RUE AROM (degrees) Overall AROM Right Upper Extremity: Deficits;Due to premorbid status RUE Overall AROM Comments: ar mflexion limited to 120 degrees; otherwise wfl RUE Strength RUE Overall Strength: Deficits;Due to premorbid status RUE Overall Strength Comments: grossly 4/5 LUE Assessment LUE Assessment: Exceptions to WFL LUE AROM (degrees) Overall AROM Left Upper Extremity: Deficits;Due to premorbid status LUE Overall AROM Comments: arm flexion limited to 120 degrees; otherwise wfl LUE Strength LUE Overall Strength: Deficits;Due to premorbid status;Due to pain;Due to precautions LUE Overall Strength Comments: grossly 3+/5 (make test completed) RLE Assessment RLE Assessment: Exceptions to [facility] RLE AROM (degrees) Overall AROM Right Lower Extremity: Deficits;Due to decreased strength;Due to premorbid status RLE Overall AROM Comments: hip flexion and ankle DF limited premorbidly by weakness RLE Strength RLE Overall Strength: Deficits;Due to premorbid status RLE Overall Strength Comments: hip flexion 3-/5, knee grossly 4/5, ankle DF 3-/5 LLE Assessment LLE Assessment: Exceptions to WFL LLE AROM (degrees) Overall AROM Left Lower Extremity: Deficits;Due to  decreased strength;Due to pain LLE Overall AROM Comments: hip flexion and ankle DF limited by premorbid muscle weakness; knee flexion/extension limited by pain & weakness LLE Strength LLE Overall Strength: Deficits;Due to pain;Due to premorbid status LLE Overall Strength Comments: hip flexion 3-/5, knee grossly 3+ to 4/5, ankle DF 3k-/5  FIM:  FIM - Locomotion: Wheelchair Distance: 80   Refer to Care Plan for Long Term Goals  Recommendations for other services: None  Discharge Criteria: Patient will be discharged from PT if patient refuses treatment 3 consecutive times without medical reason, if treatment goals not met, if there is a change in medical status, if patient makes no progress towards goals or if patient is discharged from hospital.  The above assessment, treatment plan, treatment alternatives and goals were discussed and mutually agreed upon: by patient  [facility] M 06/30/2014, 9:00 AM

## 2014-06-30 NOTE — Progress Notes (Signed)
ANTICOAGULATION CONSULT NOTE - FOLLOW UP  Pharmacy Consult for Heparin Indication: DVT  No Known Allergies  Patient Measurements: Height: 5\' 8"  (172.7 cm) Weight: 224 lb 9.6 oz (101.878 kg) IBW/kg (Calculated) : 68.4 Heparin Dosing Weight: 90 kg   Vital Signs: Temp: 97.8 F (36.6 C) (06/04 0536) Temp Source: Oral (06/04 0536) BP: 112/80 mmHg (06/04 0814) Pulse Rate: 92 (06/04 0814)  Labs:  Recent Labs  06/28/14 0500 06/29/14 0435 06/29/14 1115 06/30/14 0411  HGB 8.1* 8.1*  --   --   HCT 24.8* 25.5*  --   --   PLT 326 361  --   --   HEPARINUNFRC 0.36 0.22* 0.34 0.34  CREATININE 1.04 1.04  --   --     Estimated Creatinine Clearance: 79.7 mL/min (by C-G formula based on Cr of 1.04).   Assessment: 86 YOM with history of AVR admitted with AV thrombus after stopping his warfarin 3 months ago - on his own.  While being treated with heparin and integrilin for the clot, he developed GIB.  Anticoagulation was held and PRBC and PPI given.  He subsequently developed LUE DVT and pharmacy was consulted to dose heparin at a low rate without bolus and to maintain a low heparin level goal.  Heparin level remains therapeutic; no further bleeding reported.   Goal of Therapy:  Heparin level 0.3-0.5 units/ml Monitor platelets by anticoagulation protocol: Yes     Plan:  - Continue heparin gtt 1300 units/hr - Daily HL / CBC   Shavonn Convey D. Mina Marble, PharmD, BCPS Pager:  [phone] 06/30/2014, 2:21 PM

## 2014-06-30 NOTE — Evaluation (Signed)
Occupational Therapy Assessment and Plan  Patient Details  Name: [name] MRN: [id number] Date of Birth: Jun 15, 1945  OT Diagnosis: altered mental status and muscle weakness (generalized) Rehab Potential: Rehab Potential (ACUTE ONLY): Good ELOS: 2 weeks   Today's Date: 06/30/2014 OT Individual Time: 1300-1400 OT Individual Time Calculation (min): 60 min     Problem List:  Patient Active Problem List   Diagnosis Date Noted  . Debility 06/29/2014  . Primary gout   . Gastric erosion with bleeding   . Acute respiratory failure, unspecified whether with hypoxia or hypercapnia   . Altered mental status   . Accelerated hypertension   . Aortic insufficiency   . Aortic stenosis   . Melena   . Gastric erosion   . Aortic valve insufficiency 06/14/2014  . Cardiogenic shock, possible 06/14/2014  . Hx of aortic aneurysm repair 06/14/2014  . Atrial fibrillation, do not know length of time 06/14/2014  . Elevated troponin 06/14/2014  . Positive D dimer 06/14/2014  . CHF exacerbation   . Encounter for screening colonoscopy 11/30/2012  . Duodenal ulcer 11/30/2012  . AORTIC VALVE DISORDERS 01/13/2010  . 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 OTHER CHEST PAIN 01/13/2010  . OTHER ACQUIRED ABSENCE OF ORGAN 01/13/2010    Past Medical History:  Past Medical History  Diagnosis Date  . Chest pain   . Hypertension   . Aortic aneurysm 1993  . 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 Pneumonia   . Aortic valve replaced      05/2014 transfered from [place] to [facility] for HF in the setting of failed valve repair. 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 CT surgery considering AVR, delayed due to GI bleed  . Hx of aortic aneurysm repair 06/14/2014  . Atrial fibrillation 05/2014  . Gastritis and gastroduodenitis with hemorrhage 05/2014    severe.   . Tubular adenoma of colon 12/2012  . DVT of upper extremity (deep vein thrombosis) 05/2014    left  . Acute blood  loss anemia 05/2014   . CAD (coronary artery disease)     minimal CAD on cath 05/2014   Past Surgical History:  Past Surgical History  Procedure Laterality Date  . Abdominal aortic aneurysm repair  1993    At [facility], Unspecified  . Splenectomy    . Cardiac valve replacement      At [facility]  . Esophagogastroduodenoscopy  Aug 2014    [facility]: large duodenal ulcer with visible vessel, s/p clip and epi, erosive gastritis. +h.pylori serology, treated with amoxicillin and biaxin.   . Colonoscopy N/A 12/27/2012    Procedure: COLONOSCOPY;  Surgeon: Danie Binder, MD;  Location: [facility];  Service: Endoscopy;  Laterality: N/A;  8:45-moved to 855   . Tee without cardioversion N/A 06/15/2014    Procedure: TRANSESOPHAGEAL ECHOCARDIOGRAM (TEE);  Surgeon: Sueanne Margarita, MD;  Location: [facility] ENDOSCOPY;  Service: Cardiovascular;  Laterality: N/A;  . Cardiac catheterization N/A 06/15/2014    Procedure: Fluoroscopy Guidance;  Surgeon: Josue Hector, MD;  Location: [place] CV LAB;  Service: Cardiovascular;  Laterality: N/A;  . Esophagogastroduodenoscopy N/A 06/18/2014    Procedure: ESOPHAGOGASTRODUODENOSCOPY (EGD);  Surgeon: Jerene Bears, MD;  Location: [facility] ENDOSCOPY;  Service: Endoscopy;  Laterality: N/A;  . Peripheral vascular catheterization  06/19/2014    Procedure: Aortic Arch Angiography;  Surgeon: Jettie Booze, MD;  Location: [place] CV LAB;  Service: Cardiovascular;;  . Esophagogastroduodenoscopy N/A 06/21/2014  Procedure: ESOPHAGOGASTRODUODENOSCOPY (EGD);  Surgeon: Jerene Bears, MD;  Location: [facility] ENDOSCOPY;  Service: Endoscopy;  Laterality: N/A;  . Esophagogastroduodenoscopy N/A 06/21/2014    Procedure: ESOPHAGOGASTRODUODENOSCOPY (EGD);  Surgeon: Jerene Bears, MD;  Location: [place];  Service: Gastroenterology;  Laterality: N/A;    Assessment & Plan Clinical Impression: [name] is a 69 y.o. right handed male with history of chronic atrial fib, aortic valve repair  in the setting of aortic aneurysm in the 1990s by Dr. [name] and recently stopped Coumadin secondary to wall no ulcer was significant bleeding treated at [facility] in August 2014. Patient lives with his medically disabled wife and daughter used a cane prior to admission. Presented 06/14/2014 to [facility] complaining of shortness of breath with cardiogenic shock found to have severe aortic valve insufficiency and was transferred to [facility] for further evaluation. Placed on intravenous heparin. TEE completed showing ejection fraction of 60%. Only one of the leaflets could be visualized as moving movement was significantly reduced. There was severe aortic insufficiency. 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 Cardiac catheterization showed no significant CAD. Cardiothoracic surgery consulted for prosthetic valve dysfunction and need for surgical intervention. Hospital course complicated by GI bleed/melena with gastroenterology consulted 06/17/2014. EGD completed 06/18/2014 showing 2 gastric cardia erosions without active bleeding. Patient has been transfused during hospital course. On 06/21/2014 with increased loose tarry stools EGD repeated 06/21/2014 showing large gastric blood clot with erosion visible inferior to the GE junction. Course complicated [id number] after venous Doppler study showed acute DVT left brachial and axillary veins. Patient currently remains on heparin therapy. Currently the plan by cardiothoracic surgery is for ongoing physical therapy for strengthening conditioning and plan redo aortic root replacement in approximately 1-2 weeks. Physical therapy evaluation completed and ongoing. Patient transferred to CIR on 06/29/2014   Patient currently requires total with basic self-care skills secondary to decreased cardiorespiratoy endurance and decreased awareness, decreased problem solving,  decreased safety awareness and delayed processing.  Prior to hospitalization, patient could complete BADL with modified independent .  Patient will benefit from skilled intervention to increase independence with basic self-care skills prior to discharge D/c to acute for cardiac procedure.  Anticipate patient will require 24 hour supervision and f/u acute.  OT - End of Session Activity Tolerance: Tolerates 10 - 20 min activity with multiple rests;Tolerates 30+ min activity with multiple rests Endurance Deficit: Yes Endurance Deficit Description: cardiorespiratory OT Assessment Rehab Potential (ACUTE ONLY): Good Barriers to Discharge:  (none) OT Patient demonstrates impairments in the following area(s): Balance;Cognition;Endurance;Perception;Safety OT Basic ADL's Functional Problem(s): Grooming;Bathing;Dressing;Toileting OT Transfers Functional Problem(s): Toilet (Pt takes sponge baths) OT Plan OT Intensity: Minimum of 1-2 x/day, 45 to 90 minutes OT Frequency: 5 out of 7 days OT Duration/Estimated Length of Stay: 2 weeks OT Treatment/Interventions: Balance/vestibular training;Cognitive remediation/compensation;Discharge planning;DME/adaptive equipment instruction;Functional mobility training;Pain management;Neuromuscular re-education;Patient/family education;Self Care/advanced ADL retraining;Therapeutic Activities;Therapeutic Exercise;UE/LE Strength taining/ROM;UE/LE Coordination activities OT Self Feeding Anticipated Outcome(s): independent OT Basic Self-Care Anticipated Outcome(s): min assist OT Toileting Anticipated Outcome(s): min assist OT Bathroom Transfers Anticipated Outcome(s): min assist OT Recommendation Patient destination:  (pt being transferred to acute care for cardiac procedure) Follow Up Recommendations: 24 hour supervision/assistance;Skilled nursing facility Equipment Recommended: To be determined   Skilled Therapeutic Intervention   OT  Evaluation Precautions/Restrictions  Precautions Precautions: Fall Restrictions Weight Bearing Restrictions: No General OT Amount of Missed Time: 30 Minutes Vital Signs  98 % O2;  94 HR    Pain  none    Home Living/Prior Functioning Home Living Family/patient expects to be discharged to:: Private residence Living Arrangements: Spouse/significant other, Children Available Help at Discharge: Family, Available 24 hours/day Type of Home: House Home Access: Ramped entrance Home Layout: One level Additional Comments: Wife is bedridden, per patient  Lives With: Spouse, Family, Son, Daughter Prior Function Level of Independence: Requires assistive device for independence  Able to Take Stairs?: No Driving: No Vocation: Retired ADL   Vision/Perception  Vision- History Baseline Vision/History: No visual deficits Patient Visual Report: No change from baseline  Cognition Overall Cognitive Status: Impaired/Different from baseline Arousal/Alertness: Awake/alert Orientation Level: Person;Place;Situation Month: June Day of Week: Correct Memory: Impaired Memory Impairment: Decreased recall of new information;Decreased short term memory Immediate Memory Recall: Sock;Bed;Blue Memory Recall:  (recalled 0/3 words after 5 mins.  recalled 2/3 words with cues) Attention: Focused Focused Attention: Appears intact Awareness: Impaired Awareness Impairment: Emergent impairment Problem Solving: Impaired Problem Solving Impairment: Functional basic Safety/Judgment: Appears intact Sensation Sensation Light Touch: Impaired Detail Light Touch Impaired Details: Impaired LUE;Impaired RUE Stereognosis: Not tested Hot/Cold: Not tested Proprioception: Appears Intact Additional Comments: tingly fingertips Coordination Gross Motor Movements are Fluid and Coordinated: No Fine Motor Movements are Fluid and Coordinated: Not tested Coordination and Movement Description: slowed movements, slight  tremors in hands, delayed processing Motor  Motor Motor: Abnormal tone;Abnormal postural alignment and control Motor - Skilled Clinical Observations: slight intention tremors in hands, slouched sitting posture with forward head Mobility  Bed Mobility Bed Mobility: Rolling Left;Left Sidelying to Sit Rolling Left: 4: Min assist Rolling Left Details: Manual facilitation for placement;Verbal cues for technique Left Sidelying to Sit: 3: Mod assist Left Sidelying to Sit Details: Manual facilitation for placement;Verbal cues for technique Transfers Sit to Stand: 1: +2 Total assist;From bed Sit to Stand Details: Manual facilitation for placement Stand to Sit: 4: Min guard;With armrests  Trunk/Postural Assessment  Cervical Assessment Cervical Assessment: Within Functional Limits Thoracic Assessment Thoracic Assessment: Within Functional Limits Lumbar Assessment Lumbar Assessment: Within Functional Limits Postural Control Postural Control: Deficits on evaluation Postural Limitations: generalized slouched posture with forward head & stiff spine  Balance Balance Balance Assessed: Yes Static Sitting Balance Static Sitting - Balance Support: Feet supported;Bilateral upper extremity supported Static Sitting - Level of Assistance: 5: Stand by assistance Dynamic Sitting Balance Dynamic Sitting - Balance Support: Feet supported;Bilateral upper extremity supported Dynamic Sitting - Level of Assistance: 4: Min assist Static Standing Balance Static Standing - Balance Support: During functional activity;Bilateral upper extremity supported Static Standing - Level of Assistance: 4: Min assist Dynamic Standing Balance Dynamic Standing - Balance Support: During functional activity;Bilateral upper extremity supported Dynamic Standing - Level of Assistance: 4: Min assist Extremity/Trunk Assessment RUE Assessment RUE Assessment: Exceptions to [facility] RUE AROM (degrees) Overall AROM Right Upper Extremity:  Deficits;Due to premorbid status RUE Overall AROM Comments: arm flexion limited to 120 degrees; otherwise wfl RUE Strength RUE Overall Strength: Deficits;Due to premorbid status RUE Overall Strength Comments: grossly 4/5 LUE Assessment LUE Assessment: Exceptions to WFL LUE AROM (degrees) Overall AROM Left Upper Extremity: Deficits;Due to premorbid status LUE Overall AROM Comments: arm flexion limited to 120 degrees; otherwise wfl LUE Strength LUE Overall Strength: Deficits;Due to premorbid status;Due to pain;Due to precautions LUE Overall Strength Comments: grossly 3+/5 (make test completed)  FIM:  FIM - Grooming Grooming Steps: Wash, rinse, dry face;Wash, rinse, dry hands Grooming: 3: Patient completes 2 of 4 or 3 of 5 steps FIM - Bathing Bathing  Steps Patient Completed: Chest;Right Arm;Left Arm;Abdomen Bathing: 2: Max-Patient completes 3-4 [id number] parts or 25-49% FIM - Upper Body Dressing/Undressing Upper body dressing/undressing: 0: Wears gown/pajamas-no public clothing FIM - Toileting Toileting: 1: Total-Patient completed zero steps, helper did all 3 FIM - BControl and instrumentation engineerDevices: Walker;Arm rests Bed/Chair Transfer: 3: Sit > Supine: Mod A (lifting assist/Pt. 50-74%/lift 2 legs);1: Two helpers;1: Bed > Chair or W/C: Total A (helper does all/Pt. < 25%);1: Chair or W/C > Bed: Total A (helper does all/Pt. < 25%) FIM - TRadio producerDevices: Elevated toilet seat;Grab bars Toilet Transfers: 2-From toilet/BSC: Max A (lift and lower assist) FIM - Tub/Shower Transfers Tub/shower Transfers: 0-Activity did not occur or was simulated   Refer to Care Plan for Long Term Goals  Recommendations for other services: None  Discharge Criteria: Patient will be discharged from OT if patient refuses treatment 3 consecutive times without medical reason, if treatment goals not met, if there is a change in medical status, if patient makes  no progress towards goals or if patient is discharged from hospital.  The above assessment, treatment plan, treatment alternatives and goals were discussed and mutually agreed upon: by patient  ELisa Roca6/04/2014, 7:10 PM

## 2014-07-01 ENCOUNTER — Inpatient Hospital Stay (HOSPITAL_COMMUNITY): Payer: Commercial Managed Care - HMO | Admitting: Occupational Therapy

## 2014-07-01 DIAGNOSIS — I482 Chronic atrial fibrillation: Secondary | ICD-10-CM

## 2014-07-01 DIAGNOSIS — D62 Acute posthemorrhagic anemia: Secondary | ICD-10-CM

## 2014-07-01 LAB — CBC
HCT: 24.7 % — ABNORMAL LOW (ref 39.0–52.0)
Hemoglobin: 7.9 g/dL — ABNORMAL LOW (ref 13.0–17.0)
MCH: 28.4 pg (ref 26.0–34.0)
MCHC: 32 g/dL (ref 30.0–36.0)
MCV: 88.8 fL (ref 78.0–100.0)
Platelets: 381 10*3/uL (ref 150–400)
RBC: 2.78 MIL/uL — AB (ref 4.22–5.81)
RDW: 17.6 % — ABNORMAL HIGH (ref 11.5–15.5)
WBC: 7.9 10*3/uL (ref 4.0–10.5)

## 2014-07-01 LAB — HEPARIN LEVEL (UNFRACTIONATED)
Heparin Unfractionated: 0.13 IU/mL — ABNORMAL LOW (ref 0.30–0.70)
Heparin Unfractionated: 0.46 IU/mL (ref 0.30–0.70)

## 2014-07-01 MED ORDER — SODIUM CHLORIDE 0.9 % IJ SOLN
10.0000 mL | INTRAMUSCULAR | Status: DC | PRN
  Administered 2014-07-01: 30 mL
  Administered 2014-07-01: 10 mL
  Administered 2014-07-02: 30 mL
  Administered 2014-07-03 (×2): 10 mL
  Administered 2014-07-04: 30 mL
  Administered 2014-07-05: 10 mL
  Filled 2014-07-01 (×7): qty 40

## 2014-07-01 NOTE — Progress Notes (Signed)
ANTICOAGULATION CONSULT NOTE - FOLLOW UP  Pharmacy Consult for Heparin Indication: DVT  No Known Allergies  Patient Measurements: Height: 5\' 8"  (172.7 cm) Weight: 227 lb 9.4 oz (103.232 kg) IBW/kg (Calculated) : 68.4 Heparin Dosing Weight: 90 kg   Vital Signs: Temp: 98.3 F (36.8 C) (06/05 0554) Temp Source: Oral (06/05 0554) BP: 106/68 mmHg (06/05 0554) Pulse Rate: 74 (06/05 0554)  Labs:  Recent Labs  06/29/14 0435 06/29/14 1115 06/30/14 0411 07/01/14 0537  HGB 8.1*  --   --  7.9*  HCT 25.5*  --   --  24.7*  PLT 361  --   --  381  HEPARINUNFRC 0.22* 0.34 0.34 0.13*  CREATININE 1.04  --   --   --     Estimated Creatinine Clearance: 80.2 mL/min (by C-G formula based on Cr of 1.04).   Assessment: 75 YOM with history of AVR admitted with AV thrombus after stopping his warfarin 3 months ago - on his own.  While being treated with heparin and integrilin for the clot, he developed GIB.  Anticoagulation was held and PRBC and PPI given.  He subsequently developed LUE DVT and pharmacy was consulted to dose heparin at a low rate without bolus and to maintain a low heparin level goal.  Heparin level subtherapeutic this a.m. No issues with line per RN and no further bleeding reported.  Goal of Therapy:  Heparin level 0.3-0.5 units/ml Monitor platelets by anticoagulation protocol: Yes     Plan:  - Increase heparin gtt to 1500 units/hr - F/u 6 hr heparin level  Sherlon Handing, PharmD, BCPS Clinical pharmacist, pager [phone] 07/01/2014, 7:13 AM

## 2014-07-01 NOTE — Progress Notes (Signed)
Occupational Therapy Session Note  Patient Details  Name: [name] MRN: [id number] Date of Birth: [birth year]  Today's Date: 07/01/2014 OT Individual Time:  -   1500-1530  (30 min)      Short Term Goals: Week 1:  OT Short Term Goal 1 (Week 1): Pt. will be mod assist with bathing OT Short Term Goal 2 (Week 1): Pt will be mod assist with dressing OT Short Term Goal 3 (Week 1): Pt. will transferr to [facility] with mod assit OT Short Term Goal 4 (Week 1): Pt will be mod assist with sit to stand  Skilled Therapeutic Interventions/Progress Updates:    Pt. sitting in recliner upon OT arrival.  Pt on 2 liter O2.  Oxygen sat= 98 %, HR= 67.  Pt. Expressed desired to get back to bed.  Addressed sit to stand, scooting, and transfer from recliner to bed.  Pt required increased time and manual facilitation for leaning forward and shifting weight   anteriorly.  Needed +3 for sit to stand and stand pivot to bed.  Pt placed in bed and positioned on back with all needs in place.    Therapy Documentation Precautions:  Precautions Precautions: Fall Restrictions Weight Bearing Restrictions: No    Vital Signs: Therapy Vitals Temp: 98.6 F (37 C) Temp Source: Oral Pulse Rate: 99 Resp: 18 BP: 118/80 mmHg Patient Position (if appropriate): Sitting Oxygen Therapy SpO2: 96 % O2 Device: Not Delivered Pain: Pain Assessment Pain Assessment: No/denies pain           See FIM for current functional status  Therapy/Group: Individual Therapy  Lisa Roca 07/01/2014, 6:48 PM

## 2014-07-01 NOTE — Progress Notes (Signed)
ANTICOAGULATION CONSULT NOTE - FOLLOW UP  Pharmacy Consult for Heparin Indication: DVT  No Known Allergies  Patient Measurements: Height: 5\' 8"  (172.7 cm) Weight: 227 lb 9.4 oz (103.232 kg) IBW/kg (Calculated) : 68.4 Heparin Dosing Weight: 90 kg   Vital Signs: Temp: 98.6 F (37 C) (06/05 1500) Temp Source: Oral (06/05 1500) BP: 118/80 mmHg (06/05 1500) Pulse Rate: 99 (06/05 1500)  Labs:  Recent Labs  06/29/14 0435  06/30/14 0411 07/01/14 0537 07/01/14 1340  HGB 8.1*  --   --  7.9*  --   HCT 25.5*  --   --  24.7*  --   PLT 361  --   --  381  --   HEPARINUNFRC 0.22*  < > 0.34 0.13* 0.46  CREATININE 1.04  --   --   --   --   < > = values in this interval not displayed.  Estimated Creatinine Clearance: 80.2 mL/min (by C-G formula based on Cr of 1.04).   Assessment: 43 YOM with history of AVR admitted with AV thrombus after stopping his warfarin 3 months ago - on his own.  While being treated with heparin and integrilin for the clot, he developed GIB.  Anticoagulation was held and PRBC and PPI given.  He subsequently developed LUE DVT and pharmacy was consulted to dose heparin at a low rate without bolus and to maintain a low heparin level goal.  Heparin level subtherapeutic this a.m and rate increase from 1300 to 1500 units/hr.  Recheck HL therapeutic at 0.45 on 1500 units/hr. Hg 7.9 - stable. No issues with line per RN and no further bleeding reported.  Goal of Therapy:  Heparin level 0.3-0.5 units/ml Monitor platelets by anticoagulation protocol: Yes     Plan:  - continue heparin gtt at 1500 units/hr - daily HL and CBC  Eudelia Bunch, Pharm.D. [phone] 07/01/2014 3:09 PM

## 2014-07-01 NOTE — Progress Notes (Signed)
[name] is a 69 y.o. male [birth year] [id number]  Subjective: Reviewed RRT eval yesterday for desat with PT. Pt reports PND and prefers to remain upright because of same - no sputum or cough. Only shortness of breath with supine position or exertion. No chest pain or leg swelling. Belly ok and no n/v or melena  Objective: Vital signs in last 24 hours: Temp:  [98.3 F (36.8 C)-99 F (37.2 C)] 98.3 F (36.8 C) (06/05 0554) Pulse Rate:  [74-96] 74 (06/05 0554) Resp:  [18] 18 (06/05 0554) BP: (100-120)/(68-78) 106/68 mmHg (06/05 0554) SpO2:  [84 %-99 %] 96 % (06/05 0554) Weight:  [103.232 kg (227 lb 9.4 oz)] 103.232 kg (227 lb 9.4 oz) (06/05 0554) Weight change: 1.354 kg (2 lb 15.8 oz) Last BM Date: 06/29/14  Intake/Output from previous day: 06/04 0701 - 06/05 0700 In: 480 [P.O.:480] Out: 400 [Urine:400]  Physical Exam General: No apparent distress   In [facility] Lungs: Normal effort. Diminished at bases but lungs clear to auscultation, no crackles or wheezes. Cardiovascular: irregular rate and rhythm, no edema Musculoskeletal:  Neurovascularly intact   Lab Results: BMET    Component Value Date/Time   NA 133* 06/29/2014 0435   K 3.7 06/29/2014 0435   CL 102 06/29/2014 0435   CO2 22 06/29/2014 0435   GLUCOSE 97 06/29/2014 0435   BUN 10 06/29/2014 0435   CREATININE 1.04 06/29/2014 0435   CALCIUM 8.6* 06/29/2014 0435   GFRNONAA >60 06/29/2014 0435   GFRAA >60 06/29/2014 0435   CBC    Component Value Date/Time   WBC 7.9 07/01/2014 0537   RBC 2.78* 07/01/2014 0537   HGB 7.9* 07/01/2014 0537   HGB 7.4 11/10/2012   HCT 24.7* 07/01/2014 0537   HCT 24 11/10/2012   PLT 381 07/01/2014 0537   MCV 88.8 07/01/2014 0537   MCH 28.4 07/01/2014 0537   MCHC 32.0 07/01/2014 0537   RDW 17.6* 07/01/2014 0537   LYMPHSABS 1.1 06/23/2014 0400   MONOABS 1.8* 06/23/2014 0400   EOSABS 0.2 06/23/2014 0400   BASOSABS 0.1 06/23/2014 0400   CBG's (last 3):  No results for input(s):  GLUCAP in the last 72 hours. LFT's Lab Results  Component Value Date   ALT 17 06/29/2014   AST 25 06/29/2014   ALKPHOS 49 06/29/2014   BILITOT 0.5 06/29/2014    Studies/Results: No results found.  Medications:  I have reviewed the patient's current medications. Scheduled Medications: . antiseptic oral rinse  7 mL Mouth Rinse q12n4p  . pantoprazole (PROTONIX) IV  40 mg Intravenous Q12H   PRN Medications: acetaminophen, ondansetron **OR** ondansetron (ZOFRAN) IV, sodium chloride, sodium chloride, sorbitol  Assessment/Plan: Principal Problem:   Debility Active Problems:   Aortic valve insufficiency   Atrial fibrillation, do not know length of time   Accelerated hypertension  1. Functional deficits secondary to debilitation due to acute aortic valve insufficiency, multi-medical issues. Cardiothoracic surgery plan follow-up for surgical intervention in next few weeks 2. DVT Prophylaxis/Anticoagulation: Acute DVT left brachial and axillary veins. Currently on intravenous heparin. Await plan for long-term anticoagulation (complicated by recent GIB issues) 3. Pain Management: Tylenol as needed 4. GI bleed. Follow-up gastroenterology services. Transfuse if symptomatic. Continue PPI 5. Neuropsych: This patient is capable of making decisions on his own behalf. 6. Skin/Wound Care: Routine skin checks 7. Fluids/Electrolytes/Nutrition: Routine I&O with follow-up chemistries. 8. Chronic atrial fibrillation. Rate controlled. Intravenous heparin for now for acute DVT. Conversion to oral or long term plans for anticoagulation on  hold at present due to GI bleed 9. ABLA - Hgb stable : ?slow downward drift - consider transfusion PRBC if Hgb remains <8, but no clinical symptoms ongoing/recurrent bleed - monitor while on IV heparin and recent GIB 10. AVI, awaiting surgical intervention post rehabilitation and conditioning - O2 sat ok on RA, appears euvolemic - monitor with exertion  Length of  stay, days: 2   Valerie A. Asa Lente, MD 07/01/2014, 8:36 AM

## 2014-07-01 NOTE — Progress Notes (Signed)
Patient retaining urine during the day.  Scanned at 1830 for >400, catheterized for 500 dark amber urine.

## 2014-07-02 ENCOUNTER — Inpatient Hospital Stay (HOSPITAL_COMMUNITY): Payer: Medicare PPO | Admitting: Occupational Therapy

## 2014-07-02 ENCOUNTER — Inpatient Hospital Stay (HOSPITAL_COMMUNITY): Payer: Medicare PPO

## 2014-07-02 ENCOUNTER — Inpatient Hospital Stay (HOSPITAL_COMMUNITY): Payer: Commercial Managed Care - HMO | Admitting: Occupational Therapy

## 2014-07-02 DIAGNOSIS — I480 Paroxysmal atrial fibrillation: Secondary | ICD-10-CM

## 2014-07-02 DIAGNOSIS — G7281 Critical illness myopathy: Secondary | ICD-10-CM | POA: Diagnosis present

## 2014-07-02 LAB — CBC WITH DIFFERENTIAL/PLATELET
Basophils Absolute: 0 10*3/uL (ref 0.0–0.1)
Basophils Relative: 1 % (ref 0–1)
Eosinophils Absolute: 0.3 10*3/uL (ref 0.0–0.7)
Eosinophils Relative: 3 % (ref 0–5)
HCT: 25 % — ABNORMAL LOW (ref 39.0–52.0)
Hemoglobin: 8.1 g/dL — ABNORMAL LOW (ref 13.0–17.0)
Lymphocytes Relative: 20 % (ref 12–46)
Lymphs Abs: 1.5 10*3/uL (ref 0.7–4.0)
MCH: 29.1 pg (ref 26.0–34.0)
MCHC: 32.4 g/dL (ref 30.0–36.0)
MCV: 89.9 fL (ref 78.0–100.0)
Monocytes Absolute: 1.2 10*3/uL — ABNORMAL HIGH (ref 0.1–1.0)
Monocytes Relative: 16 % — ABNORMAL HIGH (ref 3–12)
Neutro Abs: 4.6 10*3/uL (ref 1.7–7.7)
Neutrophils Relative %: 60 % (ref 43–77)
Platelets: 379 10*3/uL (ref 150–400)
RBC: 2.78 MIL/uL — ABNORMAL LOW (ref 4.22–5.81)
RDW: 17.7 % — ABNORMAL HIGH (ref 11.5–15.5)
WBC: 7.6 10*3/uL (ref 4.0–10.5)

## 2014-07-02 LAB — COMPREHENSIVE METABOLIC PANEL
ALBUMIN: 2.5 g/dL — AB (ref 3.5–5.0)
ALT: 14 U/L — ABNORMAL LOW (ref 17–63)
AST: 21 U/L (ref 15–41)
Alkaline Phosphatase: 49 U/L (ref 38–126)
Anion gap: 8 (ref 5–15)
BUN: 8 mg/dL (ref 6–20)
CALCIUM: 8.9 mg/dL (ref 8.9–10.3)
CO2: 23 mmol/L (ref 22–32)
CREATININE: 1.12 mg/dL (ref 0.61–1.24)
Chloride: 104 mmol/L (ref 101–111)
GFR calc Af Amer: >60 mL/min (ref >60)
GFR calc non Af Amer: >60 mL/min (ref >60)
Glucose, Bld: 100 mg/dL — ABNORMAL HIGH (ref 65–99)
Potassium: 4 mmol/L (ref 3.5–5.1)
SODIUM: 135 mmol/L (ref 135–145)
TOTAL PROTEIN: 6.3 g/dL — AB (ref 6.5–8.1)
Total Bilirubin: 0.8 mg/dL (ref 0.3–1.2)

## 2014-07-02 LAB — HEPARIN LEVEL (UNFRACTIONATED): HEPARIN UNFRACTIONATED: 0.55 [IU]/mL (ref 0.30–0.70)

## 2014-07-02 NOTE — Care Management Note (Signed)
[facility] Individual Statement of Services  Patient Name:  [name]  Date:  07/02/2014  Welcome to the [facility].  Our goal is to provide you with an individualized program based on your diagnosis and situation, designed to meet your specific needs.  With this comprehensive rehabilitation program, you will be expected to participate in at least 3 hours of rehabilitation therapies Monday-Friday, with modified therapy programming on the weekends.  Your rehabilitation program will include the following services:  Physical Therapy (PT), Occupational Therapy (OT), Speech Therapy (ST), 24 hour per day rehabilitation nursing, Neuropsychology, Case Management (Social Worker), Rehabilitation Medicine, Nutrition Services and Pharmacy Services  Weekly team conferences will be held on Wednesday to discuss your progress.  Your Social Worker will talk with you frequently to get your input and to update you on team discussions.  Team conferences with you and your family in attendance may also be held.  Expected length of stay: 2 weeks  Overall anticipated outcome: min level  Depending on your progress and recovery, your program may change. Your Social Worker will coordinate services and will keep you informed of any changes. Your Social Worker's name and contact numbers are listed  below.  The following services may also be recommended but are not provided by the [name] will be made to provide these services after discharge if needed.  Arrangements include referral to agencies that provide these services.  Your insurance has been verified to be:  [employer] Your primary doctor is:  Iona Beard  Pertinent information will be shared with your doctor and your insurance company.  Social Worker:  Ovidio Kin, [place] or [phone]  Information discussed with and copy given to patient by: Elease Hashimoto, 07/02/2014, 10:55 AM

## 2014-07-02 NOTE — Progress Notes (Signed)
ANTICOAGULATION CONSULT NOTE - FOLLOW UP  Pharmacy Consult for Heparin Indication: DVT  No Known Allergies  Patient Measurements: Height: 5\' 8"  (172.7 cm) Weight: 235 lb 1.9 oz (106.65 kg) IBW/kg (Calculated) : 68.4 Heparin Dosing Weight: 90 kg   Vital Signs: Temp: 98.4 F (36.9 C) (06/06 0534) Temp Source: Oral (06/06 0534) BP: 117/72 mmHg (06/06 0534) Pulse Rate: 89 (06/06 0534)  Labs:  Recent Labs  07/01/14 0537 07/01/14 1340 07/02/14 0425  HGB 7.9*  --  8.1*  HCT 24.7*  --  25.0*  PLT 381  --  379  HEPARINUNFRC 0.13* 0.46 0.55  CREATININE  --   --  1.12    Estimated Creatinine Clearance: 75.8 mL/min (by C-G formula based on Cr of 1.12).   Assessment: 47 YOM with history of AVR admitted with AV thrombus after stopping his warfarin 3 months ago - on his own. While being treated with heparin and integrilin for the clot, he developed GIB.  Anticoagulation was held and PRBC and PPI given.  He subsequently developed LUE DVT and now continuing on IV heparin with reduced heparin level goal given recent GIB.  Heparin level slightly above goal this morning. Noted pt lost PIV, and heparin stopped at 10 AM, getting new PIV now. Hgb 8.1, improving, plt wnl. No further bleeding reported per chart. Await plan for long-term anticoagulation.  Goal of Therapy:  Heparin level 0.3-0.5 units/ml Monitor platelets by anticoagulation protocol: Yes     Plan:  - Decrease heparin gtt to 1400 units/hr when ready to resume. - F/u Heparin level in AM  Maryanna Shape, PharmD, BCPS  Clinical Pharmacist  Pager: [phone]  07/02/2014, 10:49 AM

## 2014-07-02 NOTE — Progress Notes (Signed)
Pt attempted to void with urge present with no result. Last void was unknown amount and over 8hrs. I&O cath pt for 346ml amber urine with relief evident. Will continue to monitor bladder output.

## 2014-07-02 NOTE — Progress Notes (Signed)
Patient information reviewed and entered into eRehab system by Devonte Migues, RN, CRRN, PPS Coordinator.  Information including medical coding and functional independence measure will be reviewed and updated through discharge.     Per nursing patient was given "Data Collection Information Summary for Patients in Inpatient Rehabilitation Facilities with attached "Privacy Act Statement-Health Care Records" upon admission.  

## 2014-07-02 NOTE — Progress Notes (Signed)
69 y.o. 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 [name] and recently stopped Coumadin secondary to wall no ulcer was significant bleeding treated at [facility] in August 2014. Patient lives with his medically disabled wife and daughter used a cane prior to admission. Presented 06/14/2014 to [facility] complaining of shortness of breath with cardiogenic shock found to have severe aortic valve insufficiency and was transferred to [facility] for further evaluation. Placed on intravenous heparin. TEE completed showing ejection fraction of 60%. Only one of the leaflets could be visualized as moving movement was significantly reduced. There was severe aortic insufficiency. 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 Cardiac catheterization showed no significant CAD. Cardiothoracic surgery consulted for prosthetic valve dysfunction and need for surgical intervention. Hospital course complicated by GI bleed/melena with gastroenterology consulted 06/17/2014. EGD completed 06/18/2014 showing 2 gastric cardia erosions without active bleeding. Patient has been transfused during hospital course. On 06/21/2014 with increased loose tarry stools EGD repeated 06/21/2014 showing large gastric blood clot with erosion visible inferior to the GE junction. Course complicated 01/28/7251 after venous Doppler study showed acute DVT left brachial and axillary veins Subjective/Complaints: Very tired after standing at sink with steady lift and OT assistance. He was brushing his teeth  Review of systems negative for breathing difficulty negative for chest pain, positive for shortness of breath with mild exertion  Objective: Vital Signs: Blood pressure 117/72, pulse 89, temperature 98.4 F (36.9 C), temperature source Oral, resp. rate 18, height 5' 8"   (1.727 m), weight 106.65 kg (235 lb 1.9 oz), SpO2 96 %. No results found. Results for orders placed or performed during the hospital encounter of 06/29/14 (from the past 72 hour(s))  Heparin level (unfractionated)     Status: None   Collection Time: 06/30/14  4:11 AM  Result Value Ref Range   Heparin Unfractionated 0.34 0.30 - 0.70 IU/mL    Comment:        IF HEPARIN RESULTS ARE BELOW EXPECTED VALUES, AND PATIENT DOSAGE HAS BEEN CONFIRMED, SUGGEST FOLLOW UP TESTING OF ANTITHROMBIN III LEVELS.   Heparin level (unfractionated)     Status: Abnormal   Collection Time: 07/01/14  5:37 AM  Result Value Ref Range   Heparin Unfractionated 0.13 (L) 0.30 - 0.70 IU/mL    Comment:        IF HEPARIN RESULTS ARE BELOW EXPECTED VALUES, AND PATIENT DOSAGE HAS BEEN CONFIRMED, SUGGEST FOLLOW UP TESTING OF ANTITHROMBIN III LEVELS.   CBC     Status: Abnormal   Collection Time: 07/01/14  5:37 AM  Result Value Ref Range   WBC 7.9 4.0 - 10.5 K/uL   RBC 2.78 (L) 4.22 - 5.81 MIL/uL   Hemoglobin 7.9 (L) 13.0 - 17.0 g/dL   HCT 24.7 (L) 39.0 - 52.0 %   MCV 88.8 78.0 - 100.0 fL   MCH 28.4 26.0 - 34.0 pg   MCHC 32.0 30.0 - 36.0 g/dL   RDW 17.6 (H) 11.5 - 15.5 %   Platelets 381 150 - 400 K/uL  Heparin level (unfractionated)     Status: None   Collection Time: 07/01/14  1:40 PM  Result Value Ref Range   Heparin Unfractionated 0.46 0.30 - 0.70 IU/mL    Comment:        IF HEPARIN RESULTS ARE BELOW EXPECTED VALUES, AND PATIENT DOSAGE HAS  BEEN CONFIRMED, SUGGEST FOLLOW UP TESTING OF ANTITHROMBIN III LEVELS.   CBC WITH DIFFERENTIAL     Status: Abnormal   Collection Time: 07/02/14  4:25 AM  Result Value Ref Range   WBC 7.6 4.0 - 10.5 K/uL   RBC 2.78 (L) 4.22 - 5.81 MIL/uL   Hemoglobin 8.1 (L) 13.0 - 17.0 g/dL   HCT 25.0 (L) 39.0 - 52.0 %   MCV 89.9 78.0 - 100.0 fL   MCH 29.1 26.0 - 34.0 pg   MCHC 32.4 30.0 - 36.0 g/dL   RDW 17.7 (H) 11.5 - 15.5 %   Platelets 379 150 - 400 K/uL   Neutrophils  Relative % 60 43 - 77 %   Neutro Abs 4.6 1.7 - 7.7 K/uL   Lymphocytes Relative 20 12 - 46 %   Lymphs Abs 1.5 0.7 - 4.0 K/uL   Monocytes Relative 16 (H) 3 - 12 %   Monocytes Absolute 1.2 (H) 0.1 - 1.0 K/uL   Eosinophils Relative 3 0 - 5 %   Eosinophils Absolute 0.3 0.0 - 0.7 K/uL   Basophils Relative 1 0 - 1 %   Basophils Absolute 0.0 0.0 - 0.1 K/uL  Comprehensive metabolic panel     Status: Abnormal   Collection Time: 07/02/14  4:25 AM  Result Value Ref Range   Sodium 135 135 - 145 mmol/L   Potassium 4.0 3.5 - 5.1 mmol/L   Chloride 104 101 - 111 mmol/L   CO2 23 22 - 32 mmol/L   Glucose, Bld 100 (H) 65 - 99 mg/dL   BUN 8 6 - 20 mg/dL   Creatinine, Ser 1.12 0.61 - 1.24 mg/dL   Calcium 8.9 8.9 - 10.3 mg/dL   Total Protein 6.3 (L) 6.5 - 8.1 g/dL   Albumin 2.5 (L) 3.5 - 5.0 g/dL   AST 21 15 - 41 U/L   ALT 14 (L) 17 - 63 U/L   Alkaline Phosphatase 49 38 - 126 U/L   Total Bilirubin 0.8 0.3 - 1.2 mg/dL   GFR calc non Af Amer >60 >60 mL/min   GFR calc Af Amer >60 >60 mL/min    Comment: (NOTE) The eGFR has been calculated using the CKD EPI equation. This calculation has not been validated in all clinical situations. eGFR's persistently <60 mL/min signify possible Chronic Kidney Disease.    Anion gap 8 5 - 15  Heparin level (unfractionated)     Status: None   Collection Time: 07/02/14  4:25 AM  Result Value Ref Range   Heparin Unfractionated 0.55 0.30 - 0.70 IU/mL    Comment:        IF HEPARIN RESULTS ARE BELOW EXPECTED VALUES, AND PATIENT DOSAGE HAS BEEN CONFIRMED, SUGGEST FOLLOW UP TESTING OF ANTITHROMBIN III LEVELS.      HEENT: normal Cardio: no murmur Resp: CTA B/L and unlabored GI: BS positive and nontender, nondistended Extremity:  Pulses positive and No Edema Skin:   Intact Neuro: Flat, Normal Sensory and Abnormal Motor 3 minus bilateral deltoid 4 minus bilateral biceps triceps and grip 2 minus bilateral hip flexors 3 minus knee extensors 4 at the ankle dorsiflexor  plantar flexor Musc/Skel:  Other pain with right shoulder and left shoulder range of motion pain with right shoulder and left shoulder assisted range of motion   Assessment/Plan: 1. Functional deficits secondary to critical illness myopathy, aortic valve insufficiency which require 3+ hours per day of interdisciplinary therapy in a comprehensive inpatient rehab setting. Physiatrist is providing close team supervision  and 24 hour management of active medical problems listed below. Physiatrist and rehab team continue to assess barriers to discharge/monitor patient progress toward functional and medical goals. FIM: FIM - Bathing Bathing Steps Patient Completed: Chest, Right Arm, Left Arm, Abdomen, Buttocks, Front perineal area Bathing: 3: Mod-Patient completes 5-7 40f10 parts or 50-74%  FIM - Upper Body Dressing/Undressing Upper body dressing/undressing steps patient completed: Thread/unthread left sleeve of pullover shirt/dress, Pull shirt over trunk Upper body dressing/undressing: 3: Mod-Patient completed 50-74% of tasks FIM - Lower Body Dressing/Undressing Lower body dressing/undressing: 1: Total-Patient completed less than 25% of tasks  FIM - Toileting Toileting: 1: Total-Patient completed zero steps, helper did all 3  FIM - TRadio producerDevices: Elevated toilet seat, Grab bars Toilet Transfers: 2-From toilet/BSC: Max A (lift and lower assist)  FIM - BControl and instrumentation engineerDevices: Walker, Arm rests Bed/Chair Transfer: 3: Supine > Sit: Mod A (lifting assist/Pt. 50-74%/lift 2 legs  FIM - Locomotion: Wheelchair Distance: 80 Locomotion: Wheelchair: 2: Travels 50 - 149 ft with supervision, cueing or coaxing FIM - Locomotion: Ambulation Locomotion: Ambulation: 0: Activity did not occur  Comprehension Comprehension Mode: Auditory Comprehension: 5-Follows basic conversation/direction: With no assist  Expression Expression  Mode: Verbal Expression: 3-Expresses basic 50 - 74% of the time/requires cueing 25 - 50% of the time. Needs to repeat parts of sentences.  Social Interaction Social Interaction: 4-Interacts appropriately 75 - 89% of the time - Needs redirection for appropriate language or to initiate interaction.  Problem Solving Problem Solving: 4-Solves basic 75 - 89% of the time/requires cueing 10 - 24% of the time  Memory Memory: 3-Recognizes or recalls 50 - 74% of the time/requires cueing 25 - 49% of the time  Medical Problem List and Plan: 1. Functional deficits secondary to debilitation due to aortic valve insufficiency, multi-medical. Cardiothoracic surgery plan follow-up for surgical intervention 2. DVT Prophylaxis/Anticoagulation: Acute DVT left brachial and axillary veins. Currently on intravenous heparin. Await plan for long-term anticoagulation 3. Pain Management: Tylenol as needed 4. GI bleed. Follow-up gastroenterology services. Transfuse if symptomatic. Continue PPI, recheck hemoglobin 5. Neuropsych: This patient is capable of making decisions on his own behalf. 6. Skin/Wound Care: Routine skin checks 7. Fluids/Electrolytes/Nutrition: Routine I&O with follow-up chemistries. 8. Chronic atrial fibrillation. Rate controlled. Intravenous heparin for now for DVT. Further anticoagulation on hold due to GI bleed 9. Acute blood loss anemia, hemoglobin 8.1 today we'll need to monitor closely, transfusion also needs to be judicious given his aortic valve insufficiency and need for surgery in the future LOS (Days) 3 A FACE TO FACE EVALUATION WAS PERFORMED  KIRSTEINS,ANDREW E 07/02/2014, 10:43 AM

## 2014-07-02 NOTE — Progress Notes (Signed)
Physical Therapy Session Note  Patient Details  Name: [name] MRN: [id number] Date of Birth: 1945-07-16  Today's Date: 07/02/2014 PT Individual Time: 1300-1400 PT Individual Time Calculation (min): 60 min   Short Term Goals: Week 1:  PT Short Term Goal 1 (Week 1): Pt will ambulate 10' with RW req min A  PT Short Term Goal 2 (Week 1): Pt will demonstrate sit to stand with 1 person assist, consistently.  PT Short Term Goal 3 (Week 1): Pt will initiate stair training for therapeutic strengthening during PT.  PT Short Term Goal 4 (Week 1): Pt will self propel w/c x 150' req SBA PT Short Term Goal 5 (Week 1): Pt will demonstrate bed mobility req min A  Skilled Therapeutic Interventions/Progress Updates:  tx focused on activity tolerance, strengthening, transfers and gait  Bed mobility to sit EOB with extra time, cues and supervision. O2 sats 85% on room air via fingertip oximeter; with Dynamap 91%.  SBT bed> w/c to R with mod assist.  For activity tolerance, W/c propulsion x 50' with supervision, bil UEs and bil LEs, cues for best technique.  W/c> mat to R SBT with min/mod assist, max multi modal cues.  Therapeutic activities in sitting on raised mat for forward wt shift, trunk flexion, and head/hips relationship in order to elevate hips. Sit> stand to RW with mod assist.    Gait with RW x 60' with min assist, limited by knee pain/ LE fatigue. Pt returned to room to rest sitting up in w/c; quick release belt donned, all needs within reach.    Therapy Documentation Precautions:  Precautions Precautions: Fall Restrictions Weight Bearing Restrictions: No   Pain: Pain Assessment Pain Assessment: No/denies pain   Locomotion : Ambulation Ambulation/Gait Assistance: 4: Min assist Wheelchair Mobility Distance: 100        See FIM for current functional status  Therapy/Group: Individual Therapy  Elzie Sheets 07/02/2014, 4:12 PM

## 2014-07-02 NOTE — Progress Notes (Signed)
Physical Therapy Session Note  Patient Details  Name: [name] MRN: [id number] Date of Birth: 03/30/1945  Today's Date: 07/02/2014 PT Individual Time: 4562-5638 PT Individual Time Calculation (min): 29 min   Short Term Goals: Week 1:  PT Short Term Goal 1 (Week 1): Pt will ambulate 10' with RW req min A  PT Short Term Goal 2 (Week 1): Pt will demonstrate sit to stand with 1 person assist, consistently.  PT Short Term Goal 3 (Week 1): Pt will initiate stair training for therapeutic strengthening during PT.  PT Short Term Goal 4 (Week 1): Pt will self propel w/c x 150' req SBA PT Short Term Goal 5 (Week 1): Pt will demonstrate bed mobility req min A  Skilled Therapeutic Interventions/Progress Updates:    Patient in wheelchair propelled with feet to gym with min assist on inclines and stops to rest about 2-3 times. Patient sit<>stand reaching to pull up on outside of parallel bars with min/mod assist.  Patient standing alt taps to platform in parallel bars x 10, then side stepping holding rail, then minisquats x 10, then gait x 80' with wheelchair following and min assist (with seated rests between activities.)  HR max 108, SpO2 96%. Patient back to room in chair all needs in reach with quick release belt in place.  Therapy Documentation Precautions:  Precautions Precautions: Fall Restrictions Weight Bearing Restrictions: No  Pain: Pain Assessment Pain Assessment: No/denies pain Mobility:   Locomotion : Ambulation Ambulation/Gait Assistance: 4: Min assist Wheelchair Mobility Distance: 100   See FIM for current functional status  Therapy/Group: Individual Therapy  [place], [place] 07/02/2014  07/02/2014, 5:02 PM

## 2014-07-02 NOTE — IPOC Note (Signed)
Overall Plan of Care [facility]) Patient Details Name: [name] MRN: [id number] DOB: 1945/04/10  Admitting Diagnosis: debility cardiac  Hospital Problems: Principal Problem:   Critical illness myopathy Active Problems:   Aortic valve insufficiency   Atrial fibrillation, do not know length of time   Accelerated hypertension   Debility     Functional Problem List: Nursing Bowel, Edema, Endurance, Medication Management, Motor  PT Balance, Behavior, Endurance, Motor, Safety, Sensory  OT Balance, Cognition, Endurance, Perception, Safety  SLP    TR         Basic ADL's: OT Grooming, Bathing, Dressing, Toileting     Advanced  ADL's: OT       Transfers: PT Bed Mobility, Bed to Chair, Car, [employer]  OT Toilet (Pt takes sponge baths)     Locomotion: PT Ambulation, Wheelchair Mobility, Stairs     Additional Impairments: OT    SLP        TR      Anticipated Outcomes Item Anticipated Outcome  Self Feeding independent  Swallowing      Basic self-care  min assist  Toileting  min assist   Bathroom Transfers min assist  Bowel/Bladder  Mod I  Transfers  min A of 1  Locomotion  supervision w/c management; min A gait short distances  Communication     Cognition     Pain  < 3  Safety/Judgment  Mod I   Therapy Plan: PT Intensity: Minimum of 1-2 x/day ,45 to 90 minutes PT Frequency: 5 out of 7 days PT Duration Estimated Length of Stay: 10-14 days OT Intensity: Minimum of 1-2 x/day, 45 to 90 minutes OT Frequency: 5 out of 7 days OT Duration/Estimated Length of Stay: 2 weeks         Team Interventions: Nursing Interventions Patient/Family Education, Bowel Management, Disease Management/Prevention, Medication Management, Discharge Planning  PT interventions Ambulation/gait training, Disease management/prevention, Pain management, Stair training, Training and development officer, DME/adaptive equipment instruction, Patient/family education, Therapeutic Activities,  Wheelchair propulsion/positioning, Cognitive remediation/compensation, Psychosocial support, Therapeutic Exercise, Community reintegration, Functional mobility training, UE/LE Strength taining/ROM, Discharge planning, Neuromuscular re-education, Splinting/orthotics, UE/LE Coordination activities  OT Interventions Balance/vestibular training, Cognitive remediation/compensation, Discharge planning, DME/adaptive equipment instruction, Functional mobility training, Pain management, Neuromuscular re-education, Patient/family education, Self Care/advanced ADL retraining, Therapeutic Activities, Therapeutic Exercise, UE/LE Strength taining/ROM, UE/LE Coordination activities  SLP Interventions    TR Interventions    SW/CM Interventions Discharge Planning, Psychosocial Support, Patient/Family Education    Team Discharge Planning: Destination: PT-Home ,OT-  (pt being transferred to acute care for cardiac procedure) , SLP-  Projected Follow-up: PT-Home health PT, OT-  24 hour supervision/assistance, Skilled nursing facility, SLP-  Projected Equipment Needs: PT-To be determined, OT- To be determined, SLP-  Equipment Details: PT- , OT-  Patient/family involved in discharge planning: PT- Patient,  OT-Patient, SLP-   MD ELOS: 10-14 days Medical Rehab Prognosis:  Excellent Assessment: The patient has been admitted for CIR therapies with the diagnosis of debility due to aortic insufficiency and multiple medical. The team will be addressing functional mobility, strength, stamina, balance, safety, adaptive techniques and equipment, self-care, bowel and bladder mgt, patient and caregiver education, activity tolerance, increasing capacity to tolerate future AVR. Goals have been set at min assist to supervision for basic self-care, mobility.  Potential transfer back to surgical service for surgery after rehab stay   Meredith Staggers, MD, [facility]      See Team Conference Notes for weekly updates to the plan of  care

## 2014-07-02 NOTE — Progress Notes (Signed)
Occupational Therapy Session Note  Patient Details  Name: [name] MRN: [id number] Date of Birth: 20-Jan-1946  Today's Date: 07/03/2014 OT Individual Time:  800-900  OT Individual Time Calculation (min): 60 min    Short Term Goals: Week 1:  OT Short Term Goal 1 (Week 1): Pt. will be mod assist with bathing OT Short Term Goal 2 (Week 1): Pt will be mod assist with dressing OT Short Term Goal 3 (Week 1): Pt. will transferr to [facility] with mod assit OT Short Term Goal 4 (Week 1): Pt will be mod assist with sit to stand  Skilled Therapeutic Interventions/Progress Updates:    Pt seen for BADL retraining with a focus on activity tolerance and functional mobility. Pt received in bed and had not yet eaten breakfast. Pt agreeable to completing LB self care prior to breakfast. HOB lowered temporarily to allow pt to roll in bed. He used bed rails to roll to both sides without A. From supine pt bathed 50% of his lower body. He needed total A with pants but did assist by rolling.  Pt sat to EOB with mod A to eat breakfast sitting unsupported. Pt had no c/o pain or SOB. Pt then engaged in UB bathing with set up and UB dressing with mod A as he had difficulty lifting arms to pull shirt overhead. Pt followed directions well today. Pt's nurse in room with him as he continued eating breakfast.  Therapy Documentation Precautions:  Precautions Precautions: Fall Restrictions Weight Bearing Restrictions: No      Pain: Pain Assessment Pain Assessment: No/denies pain ADL:   See FIM for current functional status  Therapy/Group: Individual Therapy  [place] 07/03/2014, 12:09 PM

## 2014-07-02 NOTE — Progress Notes (Signed)
Social Work Assessment and Plan Social Work Assessment and Plan  Patient Details  Name: [name] MRN: [id number] Date of Birth: 08-Jun-1945  Today's Date: 07/02/2014  Problem List:  Patient Active Problem List   Diagnosis Date Noted  . 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 Debility 06/29/2014  . Primary gout   . Gastric erosion with bleeding   . Acute respiratory failure, unspecified whether with hypoxia or hypercapnia   . Altered mental status   . Accelerated hypertension   . Aortic insufficiency   . Aortic stenosis   . Melena   . Gastric erosion   . Aortic valve insufficiency 06/14/2014  . Cardiogenic shock, possible 06/14/2014  . Hx of aortic aneurysm repair 06/14/2014  . Atrial fibrillation, do not know length of time 06/14/2014  . Elevated troponin 06/14/2014  . Positive D dimer 06/14/2014  . CHF exacerbation   . Encounter for screening colonoscopy 11/30/2012  . Duodenal ulcer 11/30/2012  . AORTIC VALVE DISORDERS 01/13/2010  . 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 OTHER CHEST PAIN 01/13/2010  . OTHER ACQUIRED ABSENCE OF ORGAN 01/13/2010   Past Medical History:  Past Medical History  Diagnosis Date  . Chest pain   . Hypertension   . Aortic aneurysm 1993  . 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 Pneumonia   . Aortic valve replaced      05/2014 transfered [facility] [place] to [facility] for HF in the setting of failed valve repair. 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 CT surgery considering AVR, delayed due to GI bleed  . Hx of aortic aneurysm repair 06/14/2014  . Atrial fibrillation 05/2014  . Gastritis and gastroduodenitis with hemorrhage 05/2014    severe.   . Tubular adenoma of colon 12/2012  . DVT of upper extremity (deep vein thrombosis) 05/2014    left  . Acute blood loss anemia 05/2014   . CAD (coronary artery disease)     minimal CAD on cath 05/2014   Past Surgical History:  Past Surgical History  Procedure Laterality  Date  . Abdominal aortic aneurysm repair  1993    At [facility], Unspecified  . Splenectomy    . Cardiac valve replacement      At [facility]  . Esophagogastroduodenoscopy  Aug 2014    [facility]: large duodenal ulcer with visible vessel, s/p clip and epi, erosive gastritis. +h.pylori serology, treated with amoxicillin and biaxin.   . Colonoscopy N/A 12/27/2012    Procedure: COLONOSCOPY;  Surgeon: Danie Binder, MD;  Location: [facility]O [facility];  Service: Endoscopy;  Laterality: N/A;  8:45-moved to 855   . Tee without cardioversion N/A 06/15/2014    Procedure: TRANSESOPHAGEAL ECHOCARDIOGRAM (TEE);  Surgeon: Sueanne Margarita, MD;  Location: [facility] ENDOSCOPY;  Service: Cardiovascular;  Laterality: N/A;  . Cardiac catheterization N/A 06/15/2014    Procedure: Fluoroscopy Guidance;  Surgeon: Josue Hector, MD;  Location: [place] CV LAB;  Service: Cardiovascular;  Laterality: N/A;  . Esophagogastroduodenoscopy N/A 06/18/2014    Procedure: ESOPHAGOGASTRODUODENOSCOPY (EGD);  Surgeon: Jerene Bears, MD;  Location: [facility] ENDOSCOPY;  Service: Endoscopy;  Laterality: N/A;  . Peripheral vascular catheterization  06/19/2014    Procedure: Aortic Arch Angiography;  Surgeon: Jettie Booze, MD;  Location: [place] CV LAB;  Service: Cardiovascular;;  . Esophagogastroduodenoscopy N/A 06/21/2014    Procedure: ESOPHAGOGASTRODUODENOSCOPY (EGD);  Surgeon: Jerene Bears, MD;  Location: [facility] ENDOSCOPY;  Service: Endoscopy;  Laterality: N/A;  . Esophagogastroduodenoscopy N/A  06/21/2014    Procedure: ESOPHAGOGASTRODUODENOSCOPY (EGD);  Surgeon: Jerene Bears, MD;  Location: [place];  Service: Gastroenterology;  Laterality: N/A;   Social History:  reports that he has quit smoking. He does not have any smokeless tobacco history on file. He reports that he drinks alcohol. He reports that he does not use illicit drugs.  Family / Support Systems Marital Status: Married Patient Roles: Spouse, Parent,  Caregiver Spouse/Significant Other: Charlett Blake [phone]  Children: Angela-daughter [phone]-cell Other Supports: Son in the home also and 81 yo grandson Insurance account manager in [place] Anticipated Caregiver: [name] and Merry Proud Ability/Limitations of Caregiver: [name] works nights, wife is bedridden after suffering multiple CVA's and RA. Merry Proud is unemployed but not offering to assist Caregiver Availability: Other (Comment) (Awaiting goals and coming up with a plan) Family Dynamics: Pt was providing care to his wife after aide would leave for the day. As of now they are piecing it together, unsure who would provide care to him, according to daughter.  She feels she is doing all that she can and work along with it. Yolanda Bonine is not dependable and his son hopefully will help.  Social History Preferred language: English Religion: Baptist Cultural Background: No issues Education: [employer] Read: Yes Write: Yes Employment Status: Retired Freight forwarder Issues: No issues Guardian/Conservator: None-according to MD pt is capable of making his own decisions while here, but will include daughter since she is the main family member involved here.   Abuse/Neglect Physical Abuse: Denies Verbal Abuse: Denies Sexual Abuse: Denies Exploitation of patient/patient's resources: Denies Self-Neglect: Denies  Emotional Status Pt's affect, behavior adn adjustment status: Pt is wanting to improve, but feels it is one thing after another since he has been here-in the hospital. He wants to get stronger to prepare for more surgery. He feels he is slow and takes a lot of time to move. He has alwasy been independent and helped others and wants to do this again. Recent Psychosocial Issues: multiple medical issues-neglected his own health taking care of his wife. Pyschiatric History: No history deferred depression screen at this time due to tired from therapies.  Will try to evaluate later this afternoon after his nap.  He has had multiple medical issues and would need some assist with coping.  May benefit from neuro-psych due to his illness and wife's issues. Substance Abuse History: No issues  Patient / Family Perceptions, Expectations & Goals Pt/Family understanding of illness & functional limitations: Pt and daughter can explain his medical issues, pt takes time to answer questions but is correct and can provide history.  Daughter feels he will be on rehab go for surgery and then come back to rehab. Tried to disucss plan at discharge and there is not one, so will see how pt does here. Family can't get here as much due to distance and daughter's work hours. Premorbid pt/family roles/activities: Husband, father, grandfather, caregiver, retiree, church member, etc Anticipated changes in roles/activities/participation: resume-unsure if can provide care to his wife upon discharge from here. Pt/family expectations/goals: Pt states; " I want to be able to take care of myself, but not sure if I can."  Daughter states: " I'm not sure what we will do if he needs care from here."  [employer]: Other (Comment) (Wife has aide 4-5 hrs a day) Premorbid Home Care/DME Agencies: Other (Comment) ([facility]-Involved with wife) Transportation available at discharge: Daughter and son Resource referrals recommended: Neuropsychology, Support group (specify)  Discharge Planning Living Arrangements: Spouse/significant other, Children, Other  relatives Support Systems: Spouse/significant other, Children, Other relatives, Friends/neighbors, Social worker community Type of Residence: Private residence Insurance Resources: Multimedia programmer (specify) ([place] Medicare) Financial Resources: [place] Referred: No Living Expenses: Own Money Management: Spouse, Family Does the patient have any problems obtaining your medications?: No Home Management: Daughter does the home  management Patient/Family Preliminary Plans: Unsure at this time, would like to go home but unsure how 24 hr care will be provided.  Although providing 24 hr care to pt's wife who is bedbound-supposely.  Pt reports she can not move on her own and wears a diaper due to bing incontient.Pt most likely will need care also, so daughter and family will need to discuss care plan, whether home versus NHP, which [employer] may not cover since he came to [place] Work Anticipated Follow Up Needs: HH/OP, SNF, Support Group  Clinical Impression Pleasant gentleman who is motivated but has a lot going on medically, he needs to get stronger to have more surgery and this is the primary reason he is on rehab. Daughter and pt currently do not have a discharge plan if pt requires care Upon discharge. Wife is bed bound and has a aide for 4-5 hours a day but daughter works 12 hr-nights and will need to discuss with brother if he is willing to provide care to father. Will see how pt progresses and work with Family on a realistic discharge plan.  Elease Hashimoto 07/02/2014, 2:25 PM

## 2014-07-02 NOTE — Progress Notes (Signed)
Nursing Note: Pt has small loose stool.Pt voided 300 cc and was scanned for 17 cc.wbb

## 2014-07-02 NOTE — Progress Notes (Signed)
Occupational Therapy Session Note  Patient Details  Name: [name] MRN: [id number] Date of Birth: [birth year]  Today's Date: 07/02/2014 OT Individual Time: 0930-1030 OT Individual Time Calculation (min): 60 min   Short Term Goals: Week 1:  OT Short Term Goal 1 (Week 1): Pt. will be mod assist with bathing OT Short Term Goal 2 (Week 1): Pt will be mod assist with dressing OT Short Term Goal 3 (Week 1): Pt. will transferr to [facility] with mod assit OT Short Term Goal 4 (Week 1): Pt will be mod assist with sit to stand  Skilled Therapeutic Interventions/Progress Updates:  Patient received seated EOB with no complaints of pain. Therapist gathered STEDY and pt stood from EOB (+2 for safety) using STEDY lift. Therapist assisted pt > sink for grooming tasks in standing using STEDY. Pt with decreased activity tolerance/endurance and required multiple rest breaks. Pt with decreased attention, decreased initiation, decreased problem solving and decreased motor control. IV started bleeding during session, RN notified and present for intervention. Pt fluctuated with amount of assistance needed for sit<>stand using steady, fluctuated from min>max. Encouraged nursing staff to use STEDY for transfers at this time with +2 for safety. Therapist educated pt on BUE ROM/strengthening exercises. At end of session, left pt seated in w/c with quick release belt donned and all needs within reach.   Precautions:  Precautions Precautions: Fall Restrictions Weight Bearing Restrictions: No  See FIM for current functional status  Therapy/Group: Individual Therapy  Rose-Marie Hickling , MS, OTR/L, CLT Pager: [phone]  07/02/2014, 10:31 AM

## 2014-07-03 ENCOUNTER — Inpatient Hospital Stay (HOSPITAL_COMMUNITY): Payer: Commercial Managed Care - HMO

## 2014-07-03 ENCOUNTER — Encounter (HOSPITAL_COMMUNITY): Payer: Self-pay | Admitting: Dentistry

## 2014-07-03 ENCOUNTER — Inpatient Hospital Stay (HOSPITAL_COMMUNITY): Payer: Commercial Managed Care - HMO | Admitting: Occupational Therapy

## 2014-07-03 DIAGNOSIS — Z01818 Encounter for other preprocedural examination: Secondary | ICD-10-CM

## 2014-07-03 DIAGNOSIS — I350 Nonrheumatic aortic (valve) stenosis: Secondary | ICD-10-CM

## 2014-07-03 LAB — CBC
HEMATOCRIT: 25.9 % — AB (ref 39.0–52.0)
HEMOGLOBIN: 8.1 g/dL — AB (ref 13.0–17.0)
MCH: 27.7 pg (ref 26.0–34.0)
MCHC: 31.3 g/dL (ref 30.0–36.0)
MCV: 88.7 fL (ref 78.0–100.0)
Platelets: 420 10*3/uL — ABNORMAL HIGH (ref 150–400)
RBC: 2.92 MIL/uL — ABNORMAL LOW (ref 4.22–5.81)
RDW: 17.6 % — AB (ref 11.5–15.5)
WBC: 7.2 10*3/uL (ref 4.0–10.5)

## 2014-07-03 LAB — HEPARIN LEVEL (UNFRACTIONATED): Heparin Unfractionated: 0.3 IU/mL (ref 0.30–0.70)

## 2014-07-03 NOTE — Progress Notes (Signed)
ANTICOAGULATION CONSULT NOTE - FOLLOW UP  Pharmacy Consult for Heparin Indication: DVT  No Known Allergies  Patient Measurements: Height: 5\' 8"  (172.7 cm) Weight: 230 lb 9.6 oz (104.6 kg) IBW/kg (Calculated) : 68.4 Heparin Dosing Weight: 90 kg   Vital Signs: Temp: 98.4 F (36.9 C) (06/07 0528) Temp Source: Oral (06/07 0528) BP: 132/89 mmHg (06/07 0528) Pulse Rate: 90 (06/07 0528)  Labs:  Recent Labs  07/01/14 0537 07/01/14 1340 07/02/14 0425 07/03/14 0530  HGB 7.9*  --  8.1* 8.1*  HCT 24.7*  --  25.0* 25.9*  PLT 381  --  379 420*  HEPARINUNFRC 0.13* 0.46 0.55 0.30  CREATININE  --   --  1.12  --     Estimated Creatinine Clearance: 75 mL/min (by C-G formula based on Cr of 1.12).   Assessment: 89 YOM with history of AVR admitted with AV thrombus after stopping his warfarin 3 months ago - on his own. While being treated with heparin and integrilin for the clot, he developed GIB.  Anticoagulation was held and PRBC and PPI given.  He subsequently developed LUE DVT and now continuing on IV heparin with reduced heparin level goal given recent GIB. Heparin level 0.3, low-end therapeutic goal this morning on 1400 units/hr. Hgb 8.1, stanle, plt wnl. No further bleeding reported per chart. Await plan for long-term anticoagulation.  Goal of Therapy:  Heparin level 0.3-0.5 units/ml Monitor platelets by anticoagulation protocol: Yes    Plan:  - Increase heparin rate slightly to 1450 units/hr. - F/u Heparin level in AM - f/u plans for long-term anticoagulation  Maryanna Shape, PharmD, BCPS  Clinical Pharmacist  Pager: [phone]  07/03/2014, 1:40 PM

## 2014-07-03 NOTE — Progress Notes (Signed)
Physical Therapy Session Note  Patient Details  Name: [name] MRN: [id number] Date of Birth: 05-Oct-1945  Today's Date: 07/03/2014 PT Individual Time: [phone] and 1:00 - 2:15 PT Individual Time Calculation (min): 60 min and 75 minutes  Short Term Goals: Week 1:  PT Short Term Goal 1 (Week 1): Pt will ambulate 10' with RW req min A  PT Short Term Goal 2 (Week 1): Pt will demonstrate sit to stand with 1 person assist, consistently.  PT Short Term Goal 3 (Week 1): Pt will initiate stair training for therapeutic strengthening during PT.  PT Short Term Goal 4 (Week 1): Pt will self propel w/c x 150' req SBA PT Short Term Goal 5 (Week 1): Pt will demonstrate bed mobility req min A  Skilled Therapeutic Interventions/Progress Updates:  Session 1: Patient resting in bed upon entering room. Patient assisted with changing brief, donning TEDS, pants and shoes doe to time constraints. Patient min assist with rolling side to side using bedrails and cueing for technique. Patient supine to sit with min assist using bedrails. PT used stedy to transfer patient from bed to wheelchair. Patient worked on sit to stand in front of parallel bars - working to push up from wheelchair instead of pull on bar and can do with mod assist. Patient stood +3 minutes static standing as well as stepping up and down on platform, marching x 10, and sidestepping along bar with seated rest breaks in between. O2 sats remained in upper 90's throughout session. Patient sit to stand to walker with mod assist and ambulated 90 feet with RW and min assist for balance. Patient propelled wheelchair 100 feet using bilateral LE's with occasional verbal cueing. Patient left in wheelchair in room with all items in reach and quick release belt on.  Session 2: Patient sitting in wheelchair completing lunch upon entering room. Patient ambulated up and down eight 3-inch steps with bilateral rails and min assist for balance and sequence. Patient  ambulated with RW stepping around and over obstacles with min assist. Patient stood at countertop x 5 minutes working on activity and standing tolerance while working on bilateral UE task with nuts and bolts. Patient worked on getting more of forward lean to assist with sit to stand with reaching activity. Patient worked on sit to stand from wheelchair to standing with walker x 5 with gradually requiring less and less assistance from mod assist to steady assist by the fifth try. Patient sit to stand and stand pivot with RW wheelchair to bed with steady assist. Patient min assist for sit to supine but max assist to scoot up to head of bed. Patient left in bed with all items in reach. O2sats monitored throughout session and remained in upper 90's.  Therapy Documentation Precautions:  Precautions Precautions: Fall Restrictions Weight Bearing Restrictions: No  Pain: Pain Assessment Pain Assessment: No/denies pain  Locomotion : Ambulation Ambulation/Gait Assistance: 4: Min guard Wheelchair Mobility Distance: 100   See FIM for current functional status  Therapy/Group: Individual Therapy  Elder Love M 07/03/2014, 11:35 AM

## 2014-07-03 NOTE — Progress Notes (Signed)
69 y.o. 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 [name] and recently stopped Coumadin secondary to wall no ulcer was significant bleeding treated at [facility] in August 2014. Patient lives with his medically disabled wife and daughter used a cane prior to admission. Presented 06/14/2014 to [facility] complaining of shortness of breath with cardiogenic shock found to have severe aortic valve insufficiency and was transferred to [facility] for further evaluation. Placed on intravenous heparin. TEE completed showing ejection fraction of 60%. Only one of the leaflets could be visualized as moving movement was significantly reduced. There was severe aortic insufficiency. 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 Cardiac catheterization showed no significant CAD. Cardiothoracic surgery consulted for prosthetic valve dysfunction and need for surgical intervention. Hospital course complicated by GI bleed/melena with gastroenterology consulted 06/17/2014. EGD completed 06/18/2014 showing 2 gastric cardia erosions without active bleeding. Patient has been transfused during hospital course. On 06/21/2014 with increased loose tarry stools EGD repeated 06/21/2014 showing large gastric blood clot with erosion visible inferior to the GE junction. Course complicated 71/85/5015 after venous Doppler study showed acute DVT left brachial and axillary veins Subjective/Complaints: Per OT patient was able to ambulate 80 feet with O2 sats in the mid 90s yesterday Patient denies any chest pains or shortness of breath currently he is sitting.  Review of systems negative for blood in bowel movements or abdominal pain, patient does have left greater than right knee pain. He doesn't know the cause of this. He has had problems with this at home as well,  no recent falls Objective: Vital Signs: Blood pressure 132/89, pulse 90, temperature 98.4 F (36.9 C), temperature source Oral, resp. rate 18, height _0  (1.727 m), weight 104.6 kg (230 lb 9.6 oz), SpO2 100 %. No results found. Results for orders placed or performed during the hospital encounter of 06/29/14 (from the past 72 hour(s))  Heparin level (unfractionated)     Status: Abnormal   Collection Time: 07/01/14  5:37 AM  Result Value Ref Range   Heparin Unfractionated 0.13 (L) 0.30 - 0.70 IU/mL    Comment:        IF HEPARIN RESULTS ARE BELOW EXPECTED VALUES, AND PATIENT DOSAGE HAS BEEN CONFIRMED, SUGGEST FOLLOW UP TESTING OF ANTITHROMBIN III LEVELS.   CBC     Status: Abnormal   Collection Time: 07/01/14  5:37 AM  Result Value Ref Range   WBC 7.9 4.0 - 10.5 K/uL   RBC 2.78 (L) 4.22 - 5.81 MIL/uL   Hemoglobin 7.9 (L) 13.0 - 17.0 g/dL   HCT 24.7 (L) 39.0 - 52.0 %   MCV 88.8 78.0 - 100.0 fL   MCH 28.4 26.0 - 34.0 pg   MCHC 32.0 30.0 - 36.0 g/dL   RDW 17.6 (H) 11.5 - 15.5 %   Platelets 381 150 - 400 K/uL  Heparin level (unfractionated)     Status: None   Collection Time: 07/01/14  1:40 PM  Result Value Ref Range   Heparin Unfractionated 0.46 0.30 - 0.70 IU/mL    Comment:        IF HEPARIN RESULTS ARE BELOW EXPECTED VALUES, AND PATIENT DOSAGE HAS BEEN CONFIRMED, SUGGEST FOLLOW UP TESTING OF ANTITHROMBIN III LEVELS.   CBC WITH DIFFERENTIAL     Status: Abnormal   Collection Time: 07/02/14  4:25 AM  Result  Value Ref Range   WBC 7.6 4.0 - 10.5 K/uL   RBC 2.78 (L) 4.22 - 5.81 MIL/uL   Hemoglobin 8.1 (L) 13.0 - 17.0 g/dL   HCT 25.0 (L) 39.0 - 52.0 %   MCV 89.9 78.0 - 100.0 fL   MCH 29.1 26.0 - 34.0 pg   MCHC 32.4 30.0 - 36.0 g/dL   RDW 17.7 (H) 11.5 - 15.5 %   Platelets 379 150 - 400 K/uL   Neutrophils Relative % 60 43 - 77 %   Neutro Abs 4.6 1.7 - 7.7 K/uL   Lymphocytes Relative 20 12 - 46 %   Lymphs Abs 1.5 0.7 - 4.0 K/uL   Monocytes Relative 16 (H) 3 - 12 %    Monocytes Absolute 1.2 (H) 0.1 - 1.0 K/uL   Eosinophils Relative 3 0 - 5 %   Eosinophils Absolute 0.3 0.0 - 0.7 K/uL   Basophils Relative 1 0 - 1 %   Basophils Absolute 0.0 0.0 - 0.1 K/uL  Comprehensive metabolic panel     Status: Abnormal   Collection Time: 07/02/14  4:25 AM  Result Value Ref Range   Sodium 135 135 - 145 mmol/L   Potassium 4.0 3.5 - 5.1 mmol/L   Chloride 104 101 - 111 mmol/L   CO2 23 22 - 32 mmol/L   Glucose, Bld 100 (H) 65 - 99 mg/dL   BUN 8 6 - 20 mg/dL   Creatinine, Ser 1.12 0.61 - 1.24 mg/dL   Calcium 8.9 8.9 - 10.3 mg/dL   Total Protein 6.3 (L) 6.5 - 8.1 g/dL   Albumin 2.5 (L) 3.5 - 5.0 g/dL   AST 21 15 - 41 U/L   ALT 14 (L) 17 - 63 U/L   Alkaline Phosphatase 49 38 - 126 U/L   Total Bilirubin 0.8 0.3 - 1.2 mg/dL   GFR calc non Af Amer >60 >60 mL/min   GFR calc Af Amer >60 >60 mL/min    Comment: (NOTE) The eGFR has been calculated using the CKD EPI equation. This calculation has not been validated in all clinical situations. eGFR's persistently <60 mL/min signify possible Chronic Kidney Disease.    Anion gap 8 5 - 15  Heparin level (unfractionated)     Status: None   Collection Time: 07/02/14  4:25 AM  Result Value Ref Range   Heparin Unfractionated 0.55 0.30 - 0.70 IU/mL    Comment:        IF HEPARIN RESULTS ARE BELOW EXPECTED VALUES, AND PATIENT DOSAGE HAS BEEN CONFIRMED, SUGGEST FOLLOW UP TESTING OF ANTITHROMBIN III LEVELS.   Heparin level (unfractionated)     Status: None   Collection Time: 07/03/14  5:30 AM  Result Value Ref Range   Heparin Unfractionated 0.30 0.30 - 0.70 IU/mL    Comment:        IF HEPARIN RESULTS ARE BELOW EXPECTED VALUES, AND PATIENT DOSAGE HAS BEEN CONFIRMED, SUGGEST FOLLOW UP TESTING OF ANTITHROMBIN III LEVELS.   CBC     Status: Abnormal   Collection Time: 07/03/14  5:30 AM  Result Value Ref Range   WBC 7.2 4.0 - 10.5 K/uL   RBC 2.92 (L) 4.22 - 5.81 MIL/uL   Hemoglobin 8.1 (L) 13.0 - 17.0 g/dL   HCT 25.9 (L)  39.0 - 52.0 %   MCV 88.7 78.0 - 100.0 fL   MCH 27.7 26.0 - 34.0 pg   MCHC 31.3 30.0 - 36.0 g/dL   RDW 17.6 (H) 11.5 - 15.5 %  Platelets 420 (H) 150 - 400 K/uL     HEENT: normal Cardio: no murmur Resp: CTA B/L and unlabored GI: BS positive and nontender, nondistended Extremity:  Pulses positive and No Edema Skin:   Intact Neuro: Flat, Normal Sensory and Abnormal Motor 3 minus bilateral deltoid 4 minus bilateral biceps triceps and grip 2 minus bilateral hip flexors 3 minus knee extensors 4 at the ankle dorsiflexor plantar flexor Musc/Skel: no evidence of knee effusion, no tenderness along the patellar tendon bilaterally no tenderness along the medial or lateral joint line bilaterally no pain with knee flexion and extension pain with right shoulder and left shoulder assisted range of motion   Assessment/Plan: 1. Functional deficits secondary to critical illness myopathy, aortic valve insufficiency which require 3+ hours per day of interdisciplinary therapy in a comprehensive inpatient rehab setting. Physiatrist is providing close team supervision and 24 hour management of active medical problems listed below. Physiatrist and rehab team continue to assess barriers to discharge/monitor patient progress toward functional and medical goals. FIM: FIM - Bathing Bathing Steps Patient Completed: Chest, Right Arm, Left Arm, Abdomen, Buttocks, Front perineal area Bathing: 3: Mod-Patient completes 5-7 [id number] parts or 50-74%  FIM - Upper Body Dressing/Undressing Upper body dressing/undressing steps patient completed: Thread/unthread left sleeve of pullover shirt/dress, Pull shirt over trunk Upper body dressing/undressing: 3: Mod-Patient completed 50-74% of tasks FIM - Lower Body Dressing/Undressing Lower body dressing/undressing: 1: Total-Patient completed less than 25% of tasks  FIM - Toileting Toileting: 1: Total-Patient completed zero steps, helper did all 3  FIM - TSport and exercise psychologistDevices: Elevated toilet seat, Grab bars Toilet Transfers: 2-From toilet/BSC: Max A (lift and lower assist)  FIM - BControl and instrumentation engineerDevices: Walker (parallel bars) Bed/Chair Transfer: 3: Bed > Chair or W/C: Mod A (lift or lower assist)  FIM - Locomotion: Wheelchair Distance: 100 Locomotion: Wheelchair: 5: Travels 150 ft or more: maneuvers on rugs and over door sills with supervision, cueing or coaxing FIM - Locomotion: Ambulation Locomotion: Ambulation Assistive Devices: WAdministratorAmbulation/Gait Assistance: 4: Min assist Locomotion: Ambulation: 2: Travels 50 - 149 ft with minimal assistance (Pt.>75%)  Comprehension Comprehension Mode: Auditory Comprehension: 5-Follows basic conversation/direction: With no assist  Expression Expression Mode: Verbal Expression: 5-Expresses basic needs/ideas: With no assist  Social Interaction Social Interaction: 4-Interacts appropriately 75 - 89% of the time - Needs redirection for appropriate language or to initiate interaction.  Problem Solving Problem Solving: 4-Solves basic 75 - 89% of the time/requires cueing 10 - 24% of the time  Memory Memory: 3-Recognizes or recalls 50 - 74% of the time/requires cueing 25 - 49% of the time  Medical Problem List and Plan: 1. Functional deficits secondary to debilitation due to aortic valve insufficiency, multi-medical. Cardiothoracic surgery plan follow-up for surgical intervention 2. DVT Prophylaxis/Anticoagulation: Acute DVT left brachial and axillary veins. Currently on intravenous heparin. Await plan for long-term anticoagulation 3. Pain Management: Tylenol as needed, left greater than right knee pain suspect osteoarthritis will check x-ray of the left knee, consider Voltaren gel 4. GI bleed. Follow-up gastroenterology services. Transfuse if symptomatic. Continue PPI, recheck hemoglobin 5. Neuropsych: This patient is capable of making decisions  on his own behalf. 6. Skin/Wound Care: Routine skin checks 7. Fluids/Electrolytes/Nutrition: Routine I&O with follow-up chemistries. 8. Chronic atrial fibrillation. Rate controlled. Intravenous heparin for now for DVT. Further anticoagulation on hold due to GI bleed 9. Acute blood loss anemia, hemoglobin 8.1 again today we'll need to monitor closely, transfusion also  needs to be judicious given his aortic valve insufficiency  LOS (Days) 4 A FACE TO FACE EVALUATION WAS PERFORMED  KIRSTEINS,ANDREW E 07/03/2014, 10:32 AM

## 2014-07-03 NOTE — Consult Note (Signed)
DENTAL CONSULTATION  Date of Consultation:  07/03/2014   Patient Name:   [name] Date of Birth:   [birth year] Medical Record Number: [id number]  VITALS: BP 132/89 mmHg  Pulse 90  Temp(Src) 98.4 F (36.9 C) (Oral)  Resp 18  Ht 5\' 8"  (1.727 m)  Wt 230 lb 9.6 oz (104.6 kg)  BMI 35.07 kg/m2  SpO2 100%  CHIEF COMPLAINT: Patient is referred by Dr. Tharon Aquas Trigt for dental consultation.   HPI: [name] is a 69 year old male with history of failed aortic valve replacement. Patient with anticipated redo of the aortic valve replacement with Dr. Tharon Aquas Trigt. Patient is now seen as part of a pre-heart valve surgery dental protocol evaluation to rule out dental infection that may affect the patient's systemic health and anticipated heart valve surgery.  The patient currently denies acute toothaches, swellings, or abscesses. Patient has not seen a dentist for "a long time". This may be 10-20 years by report. Patient denies having a regular primary dentist. Patient does not seek regular dental care. Patient denies having any partial dentures.  PROBLEM LIST: Patient Active Problem List   Diagnosis Date Noted  . 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 Debility 06/29/2014  . Primary gout   . Gastric erosion with bleeding   . Acute respiratory failure, unspecified whether with hypoxia or hypercapnia   . Altered mental status   . Accelerated hypertension   . Aortic insufficiency   . Aortic stenosis   . Melena   . Gastric erosion   . Aortic valve insufficiency 06/14/2014  . Cardiogenic shock, possible 06/14/2014  . Hx of aortic aneurysm repair 06/14/2014  . Atrial fibrillation, do not know length of time 06/14/2014  . Elevated troponin 06/14/2014  . Positive D dimer 06/14/2014  . CHF exacerbation   . Encounter for screening colonoscopy 11/30/2012  . Duodenal ulcer 11/30/2012  . AORTIC VALVE DISORDERS 01/13/2010  . 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 OTHER CHEST  PAIN 01/13/2010  . OTHER ACQUIRED ABSENCE OF ORGAN 01/13/2010    PMH: Past Medical History  Diagnosis Date  . Chest pain   . Hypertension   . Aortic aneurysm 1993  . 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 Pneumonia   . Aortic valve replaced      05/2014 transfered from [place] to [facility] for HF in the setting of failed valve repair. 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 CT surgery considering AVR, delayed due to GI bleed  . Hx of aortic aneurysm repair 06/14/2014  . Atrial fibrillation 05/2014  . Gastritis and gastroduodenitis with hemorrhage 05/2014    severe.   . Tubular adenoma of colon 12/2012  . DVT of upper extremity (deep vein thrombosis) 05/2014    left  . Acute blood loss anemia 05/2014   . CAD (coronary artery disease)     minimal CAD on cath 05/2014    PSH: Past Surgical History  Procedure Laterality Date  . Abdominal aortic aneurysm repair  1993    At [facility], Unspecified  . Splenectomy    . Cardiac valve replacement      At [facility]  . Esophagogastroduodenoscopy  Aug 2014    [facility]: large duodenal ulcer with visible vessel, s/p clip and epi, erosive gastritis. +h.pylori serology, treated with amoxicillin and biaxin.   . Colonoscopy N/A 12/27/2012    Procedure: COLONOSCOPY;  Surgeon: Danie Binder, MD;  Location: [facility];  Service:  Endoscopy;  Laterality: N/A;  8:45-moved to 855   . Tee without cardioversion N/A 06/15/2014    Procedure: TRANSESOPHAGEAL ECHOCARDIOGRAM (TEE);  Surgeon: Sueanne Margarita, MD;  Location: [facility] ENDOSCOPY;  Service: Cardiovascular;  Laterality: N/A;  . Cardiac catheterization N/A 06/15/2014    Procedure: Fluoroscopy Guidance;  Surgeon: Josue Hector, MD;  Location: [place] CV LAB;  Service: Cardiovascular;  Laterality: N/A;  . Esophagogastroduodenoscopy N/A 06/18/2014    Procedure: ESOPHAGOGASTRODUODENOSCOPY (EGD);  Surgeon: Jerene Bears, MD;  Location: [facility] ENDOSCOPY;  Service: Endoscopy;   Laterality: N/A;  . Peripheral vascular catheterization  06/19/2014    Procedure: Aortic Arch Angiography;  Surgeon: Jettie Booze, MD;  Location: [place] CV LAB;  Service: Cardiovascular;;  . Esophagogastroduodenoscopy N/A 06/21/2014    Procedure: ESOPHAGOGASTRODUODENOSCOPY (EGD);  Surgeon: Jerene Bears, MD;  Location: [facility] ENDOSCOPY;  Service: Endoscopy;  Laterality: N/A;  . Esophagogastroduodenoscopy N/A 06/21/2014    Procedure: ESOPHAGOGASTRODUODENOSCOPY (EGD);  Surgeon: Jerene Bears, MD;  Location: [place];  Service: Gastroenterology;  Laterality: N/A;    ALLERGIES: No Known Allergies  MEDICATIONS: Current Facility-Administered Medications  Medication Dose Route Frequency Provider Last Rate Last Dose  . acetaminophen (TYLENOL) tablet 650 mg  650 mg Oral Q6H PRN Lavon Paganini Angiulli, PA-C      . antiseptic oral rinse (CPC / CETYLPYRIDINIUM CHLORIDE 0.05%) solution 7 mL  7 mL Mouth Rinse q12n4p Daniel J Angiulli, PA-C   7 mL at 07/02/14 1730  . heparin ADULT infusion 100 units/mL (25000 units/250 mL)  1,400 Units/hr Intravenous Continuous Leodis Sias, RPH 14 mL/hr at 07/03/14 0131 1,400 Units/hr at 07/03/14 0131  . ondansetron (ZOFRAN) tablet 4 mg  4 mg Oral Q6H PRN Lavon Paganini Angiulli, PA-C       Or  . ondansetron (ZOFRAN) injection 4 mg  4 mg Intravenous Q6H PRN Lavon Paganini Angiulli, PA-C      . pantoprazole (PROTONIX) injection 40 mg  40 mg Intravenous Q12H Daniel J Angiulli, PA-C   40 mg at 07/02/14 2034  . sodium chloride 0.9 % injection 10-40 mL  10-40 mL Intracatheter PRN Charlett Blake, MD   30 mL at 06/30/14 2256  . sodium chloride 0.9 % injection 10-40 mL  10-40 mL Intracatheter PRN Charlett Blake, MD   30 mL at 07/02/14 0424  . sorbitol 70 % solution 30 mL  30 mL Oral Daily PRN Lavon Paganini Angiulli, PA-C       Facility-Administered Medications Ordered in Other Encounters  Medication Dose Route Frequency Provider Last Rate Last Dose  . fentaNYL (SUBLIMAZE) injection     Anesthesia Intra-op Durwin Glaze Flowers, CRNA   50 mcg at 06/21/14 0700  . lactated ringers infusion    Continuous PRN Rokoshi T Flowers, Immunologist      . midazolam (VERSED) 5 MG/5ML injection    Anesthesia Intra-op Rokoshi T Flowers, CRNA   1 mg at 06/21/14 0700  . phenylephrine (NEO-SYNEPHRINE) injection    Anesthesia Intra-op Ollen Bowl, CRNA   40 mcg at 06/21/14 0725    LABS: Lab Results  Component Value Date   WBC 7.2 07/03/2014   HGB 8.1* 07/03/2014   HCT 25.9* 07/03/2014   MCV 88.7 07/03/2014   PLT 420* 07/03/2014      Component Value Date/Time   NA 135 07/02/2014 0425   K 4.0 07/02/2014 0425   CL 104 07/02/2014 0425   CO2 23 07/02/2014 0425   GLUCOSE 100* 07/02/2014 0425   BUN  8 07/02/2014 0425   CREATININE 1.12 07/02/2014 0425   CALCIUM 8.9 07/02/2014 0425   GFRNONAA >60 07/02/2014 0425   GFRAA >60 07/02/2014 0425   Lab Results  Component Value Date   INR 1.37 06/21/2014   INR 1.67* 06/15/2014   No results found for: PTT  SOCIAL HISTORY: History   Social History  . Marital Status: Married    Spouse Name: N/A  . Number of Children: 3  . Years of Education: N/A   Occupational History  . Retired    Social History Main Topics  . Smoking status: Former Research scientist (life sciences)  . Smokeless tobacco: Not on file  . Alcohol Use: Yes     Comment: 3-4 beers per day  . Drug Use: No  . Sexual Activity: Not on file   Other Topics Concern  . Not on file   Social History Narrative   Patient has two daughters and one son in good health    FAMILY HISTORY: Family History  Problem Relation Age of Onset  . Hypertension Father 12  . Diabetes Mother 57  . Colon cancer Neg Hx     REVIEW OF SYSTEMS: Reviewed from chart for this admission.  DENTAL HISTORY: CHIEF COMPLAINT: Patient is referred by Dr. Tharon Aquas Trigt for dental consultation.   HPI: [name] is a 69 year old male with history of failed aortic valve replacement. Patient with anticipated redo of the  aortic valve replacement Dr. Tharon Aquas Trigt. Patient is now seen as part of a pre-heart valve surgery dental protocol evaluation to rule out dental infection that may affect the patient's systemic health and anticipated heart valve surgery.  The patient currently denies acute toothaches, swellings, or abscesses. Patient has not seen a dentist for "a long time". This may be 10-20 years by report. Patient denies having a regular primary dentist. Patient does not seek regular dental care. Patient denies having any partial dentures.   DENTAL EXAMINATION: GENERAL: The patient is a well-developed, well-nourished male in no acute distress. HEAD AND NECK: There is no palpable submandibular lymphadenopathy. The patient denies acute TMJ symptoms. INTRAORAL EXAM: Patient has normal saliva. I do not see any evidence of oral abscess formation. DENTITION: Patient with multiple missing teeth numbers 1, 2, 7, 8, 15, 16, 17, 18, 19, 20, 30, 31, and 32. There are retained roots in the area tooth numbers 4 and 5. PERIODONTAL: The patient has chronic periodontitis with plaque and calculus accumulations, gingival recession, and tooth mobility. There is moderate to severe bone loss noted. DENTAL CARIES/SUBOPTIMAL RESTORATIONS: Multiple dental caries are noted. ENDODONTIC: Patient currently denies acute pulpitis symptoms. The patient has periapical pathology associated with tooth numbers 4 and 5 and 13 and 14. CROWN AND BRIDGE: There are no crown or bridge restorations noted. PROSTHODONTIC: Patient denies having partial dentures. OCCLUSION: Patient has a poor occlusal scheme secondary to multiple missing teeth, supra-eruption and drifting of the unopposed teeth into the edentulous areas, and lack of replacement of missing teeth with dental prostheses.  RADIOGRAPHIC INTERPRETATION: An orthopantogram was taken on 06/19/2014. There are multiple missing teeth. There are multiple retained root segments. There is moderate to  severe bone loss. There multiple areas of periapical pathology and radiolucency. Multiple diastemas are noted. There is supra-eruption and drifting of the unopposed teeth into the edentulous areas. Radiographic calculus is noted.   ASSESSMENTS: 1. Failed aortic valve replacement with anticipated redo of the aortic valve replacement with Dr. Tharon Aquas Trigt. 2. Anticoagulant therapy with risk for bleeding  with invasive dental procedures 3. Pre-heart valve surgery dental protocol examination 4. Chronic apical periodontitis 5. Multiple retained root segments 6. Dental caries 7. Chronic periodontitis with bone loss 8. Gingival recession 9. Accretions 10. Tooth mobility 11. Multiple missing teeth 12. Supra-eruption and drifting of the unopposed teeth into the edentulous areas 13. No history of partial dentures 14. Poor occlusal scheme and malocclusion 15. Significant risk for complications up to and including death with invasive dental procedures due to overall cardiovascular compromise  PLAN/RECOMMENDATIONS: 1. I discussed the risks, benefits, and complications of various treatment options with the patient in relationship to his medical and dental conditions, redo aortic valve replacement, and risk for endocarditis and bleeding complications. We discussed various treatment options to include no treatment, total and subtotal extractions with alveoloplasty, pre-prosthetic surgery as indicated, periodontal therapy, dental restorations, root canal therapy, crown and bridge therapy, implant therapy, and replacement of missing teeth as indicated. The patient currently wishes to proceed with multiple extraction of indicated teeth with alveoloplasty and pre-prosthetic surgery as indicated along with gross debridement of remaining dentition in the operating room with general anesthesia. This has been scheduled for this coming Thursday, 08/04/2014 at 7:15 AM. Heparin therapy will be discontinued 6 hours prior  to invasive dental procedures and withheld hopefully until Friday morning before restarting Heparin with NO BOLUS.  Patient will then have aortic valve replacement scheduled at the discretion of Dr. Tharon Aquas Trigt.  The patient understands that he will need to follow-up with a general dentist for exam, radiographs, and evaluation for other dental treatment needs once he is medically stable. Patient understands that he will need antibiotic premedication prior to invasive dental procedures per American Heart Association guidelines.  2. Discussion of findings with medical team and coordination of future medical and dental care as needed.   [name], DDS

## 2014-07-03 NOTE — Progress Notes (Signed)
Occupational Therapy Session Note  Patient Details  Name: [name] MRN: [id number] Date of Birth: [birth year]  Today's Date: 07/03/2014 OT Individual Time: 1030-1140 OT Individual Time Calculation (min): 70 min    Short Term Goals: Week 1:  OT Short Term Goal 1 (Week 1): Pt. will be mod assist with bathing OT Short Term Goal 2 (Week 1): Pt will be mod assist with dressing OT Short Term Goal 3 (Week 1): Pt. will transferr to [facility] with mod assit OT Short Term Goal 4 (Week 1): Pt will be mod assist with sit to stand  Skilled Therapeutic Interventions/Progress Updates:    Pt seen this session to focus on functional mobility and activity tolerance with ADLs. Pt was already dressed this am, pt worked on grooming at the sink and then standing at sink. Pt stood up using BUE on arm rests with min A then stood at sink with weight shifting activities with close S. Pt repeated this several times. Pt worked on AROM of B shoulders which are now limited to 90 degrees, functional distal grasp.  Pt did not need to toilet, but agreeable to working on ambulation into bathroom and toilet transfers. Ambulated with RW with steady A and used B grab bars to lower onto toilet. Pt then needed to toilet. Stood and pulled pants down. Brief partially soiled. Pt worked on cleansing self in standing and was able to tolerate standing for 8 minutes to thoroughly complete cleansing. Ambulated back to w/c. Pt set up in chair with quick release belt and call light.   Therapy Documentation Precautions:  Precautions Precautions: Fall Restrictions Weight Bearing Restrictions: No   Pain: Pain Assessment Pain Assessment: No/denies pain ADL:  See FIM for current functional status  Therapy/Group: Individual Therapy  [place] 07/03/2014, 12:11 PM

## 2014-07-04 ENCOUNTER — Inpatient Hospital Stay (HOSPITAL_COMMUNITY): Payer: Commercial Managed Care - HMO | Admitting: Occupational Therapy

## 2014-07-04 ENCOUNTER — Inpatient Hospital Stay (HOSPITAL_COMMUNITY): Payer: Medicare PPO

## 2014-07-04 DIAGNOSIS — Z01818 Encounter for other preprocedural examination: Secondary | ICD-10-CM

## 2014-07-04 DIAGNOSIS — I350 Nonrheumatic aortic (valve) stenosis: Secondary | ICD-10-CM

## 2014-07-04 LAB — CBC
HCT: 25.8 % — ABNORMAL LOW (ref 39.0–52.0)
Hemoglobin: 8.1 g/dL — ABNORMAL LOW (ref 13.0–17.0)
MCH: 27.6 pg (ref 26.0–34.0)
MCHC: 31.4 g/dL (ref 30.0–36.0)
MCV: 88.1 fL (ref 78.0–100.0)
Platelets: 411 10*3/uL — ABNORMAL HIGH (ref 150–400)
RBC: 2.93 MIL/uL — ABNORMAL LOW (ref 4.22–5.81)
RDW: 17.6 % — AB (ref 11.5–15.5)
WBC: 7.8 10*3/uL (ref 4.0–10.5)

## 2014-07-04 LAB — HEPARIN LEVEL (UNFRACTIONATED): Heparin Unfractionated: 0.39 IU/mL (ref 0.30–0.70)

## 2014-07-04 MED ORDER — CEFAZOLIN SODIUM-DEXTROSE 2-3 GM-% IV SOLR
2.0000 g | INTRAVENOUS | Status: AC
  Administered 2014-07-05: 2 g via INTRAVENOUS
  Filled 2014-07-04: qty 50

## 2014-07-04 MED ORDER — DICLOFENAC SODIUM 1 % TD GEL
2.0000 g | Freq: Four times a day (QID) | TRANSDERMAL | Status: DC
Start: 2014-07-04 — End: 2014-07-05
  Administered 2014-07-04 (×2): 2 g via TOPICAL
  Filled 2014-07-04: qty 100

## 2014-07-04 NOTE — Progress Notes (Signed)
Occupational Therapy Session Note  Patient Details  Name: [name] MRN: [id number] Date of Birth: January 18, 1946  Today's Date: 07/04/2014 OT Individual Time: 0160-1093 OT Individual Time Calculation (min): 42 min    Short Term Goals: Week 1:  OT Short Term Goal 1 (Week 1): Pt. will be mod assist with bathing OT Short Term Goal 2 (Week 1): Pt will be mod assist with dressing OT Short Term Goal 3 (Week 1): Pt. will transferr to [facility] with mod assit OT Short Term Goal 4 (Week 1): Pt will be mod assist with sit to stand  Skilled Therapeutic Interventions/Progress Updates:    Pt seen for BADL retraining of grooming, bathing, dressing with a focus on activity tolerance, sit to stand and standing balance. Pt received in W/c. Pt stated he was slightly tired from PT, but otherwise was ready to participate in OT. Pt completed grooming from sitting in w/c at sink and bathing UB from sitting. Pt able to reach forward to don pants over feet and don shoes. Pt was able stand up from w/c at least 5x this session all with min A to facilitate forward lean and wt shift. Pt able to tolerate standing for several minutes at a time to thoroughly bathe his bottom and to pull pants over hips and adjust his belt. Pt did not express fatigue. Pt adjusted in w/c with quick release belt and call light in reach.  Therapy Documentation Precautions:  Precautions Precautions: Fall Restrictions Weight Bearing Restrictions: No    Vital Signs: Therapy Vitals Pulse Rate: (!) 125 Patient Position (if appropriate): Sitting Oxygen Therapy SpO2: (!) 75 % (after gait) O2 Device: Not Delivered Pain: Pain Assessment Pain Assessment: No/denies pain ADL:  See FIM for current functional status  Therapy/Group: Individual Therapy  [place] 07/04/2014, 10:54 AM

## 2014-07-04 NOTE — Progress Notes (Signed)
Physical Therapy Session Note  Patient Details  Name: [name] MRN: [id number] Date of Birth: 1945/07/25  Today's Date: 07/04/2014 PT Individual Time: 0800-0900; 1300-1435 PT Individual Time Calculation (min): 60 min , 95 min  Short Term Goals: Week 1:  PT Short Term Goal 1 (Week 1): Pt will ambulate 10' with RW req min A  PT Short Term Goal 2 (Week 1): Pt will demonstrate sit to stand with 1 person assist, consistently.  PT Short Term Goal 3 (Week 1): Pt will initiate stair training for therapeutic strengthening during PT.  PT Short Term Goal 4 (Week 1): Pt will self propel w/c x 150' req SBA PT Short Term Goal 5 (Week 1): Pt will demonstrate bed mobility req min A  Skilled Therapeutic Interventions/Progress Updates:  tx 1: focused on bed mobility, transfers, activity tolerance and gait  Bed mobility for PT to don brief, with HOB elevated, rolling L><R using rails- supervision.  Supine> sit with min assist from L to elevate trunk; limited by pain L elbow due to bone chip, per pt.  Sit> stand from raised bed, min guard assist; with RW step pivot to R to w/c with close supervision.  Poor eccentric control during descent requiring assist.  W/c propulsion for activity tolerance, using bil LEs, x 100' with 1 rest break, supervision.  Gait with RW x 123' with min guard/supervision, with 1 standing rest break, min assist sit> stand from w/c at 20" height.  O2 sats dropped to 75% and slowly rose with deep breathing cues to 90% on room air..  RN informed at end of session.     Pt returned to room , resting in w/c with all needs within reach, quick release belt applied.  Tx2;  O2 sats at rest = 94%  therapeutic exs in supported sitting for LE and core strengthening, long arc quad knee ext, marching, ankle pumps, calf raises bil hip add against resistance x 3 second hold, all 10 x 1 each. Standing in parallel bars, pt performed 10 x 1 each L and R hip abd, 5 x 1 calf raises and toe raises  with 3 second holds; w/c propulsion for activity tolerance x 40' using bil LEs  Therapeutic activity in supported sitting : pelvic dissociation scooting forward/backward in w/c during transfers, rolling large therapy ball forward/backward  to facilitate forward wt shift and hip flexion for sit> stand  Sit> stand from w/c with min guard assist.  Gait with RW, ACE on L knee with min guard assist, x 90'.  Pt desaturated as in AM, O2 sats 75% after gait, slowly rising to >90% in about 2 min. Up/down 8 (3" high) steps bil rails, ascending forwards, descending backwards, step to method.   Sit> stand improved during session, as pt is less fearful of leaning forward, and elicits hip flexion and anterior pelvic tilt .  Pt required numerous sitting rest breaks throughout session due to DOE 3/4. O2 sats are extremely difficult to assess with fingertip oximeter or Dynamap.   Pt returned to room, quick release belt applied and all needs left within reach.   Therapy Documentation Precautions:  Precautions Precautions: Fall Restrictions Weight Bearing Restrictions: No   Pain: Pain Assessment Pain Assessment: No/denies pain   Locomotion : Ambulation Ambulation/Gait Assistance: 4: Min guard;5: Supervision Wheelchair Mobility Distance: 100    See FIM for current functional status  Therapy/Group: Individual Therapy  Josslynn Mentzer 07/04/2014, 1:01 PM

## 2014-07-04 NOTE — Progress Notes (Signed)
Consent in pt chart filled out (procedure and reason) but not signed by pt. RN did not review procedure with pt. RN unaware of exact procedure and did not discuss risks and benefits with pt. Consent needs to be completed by MD/surgen prior to surgery.

## 2014-07-04 NOTE — Progress Notes (Signed)
PRE-OPERATIVE NOTE:  07/04/2014   Assunta Found [id number]  VITALS: BP 128/76 mmHg  Pulse 107  Temp(Src) 98.5 F (36.9 C) (Oral)  Resp 16  Ht 5\' 8"  (1.727 m)  Wt 227 lb 11.2 oz (103.284 kg)  BMI 34.63 kg/m2  SpO2 97%  Lab Results  Component Value Date   WBC 7.8 07/04/2014   HGB 8.1* 07/04/2014   HCT 25.8* 07/04/2014   MCV 88.1 07/04/2014   PLT 411* 07/04/2014   BMET    Component Value Date/Time   NA 135 07/02/2014 0425   K 4.0 07/02/2014 0425   CL 104 07/02/2014 0425   CO2 23 07/02/2014 0425   GLUCOSE 100* 07/02/2014 0425   BUN 8 07/02/2014 0425   CREATININE 1.12 07/02/2014 0425   CALCIUM 8.9 07/02/2014 0425   GFRNONAA >60 07/02/2014 0425   GFRAA >60 07/02/2014 0425    Lab Results  Component Value Date   INR 1.37 06/21/2014   INR 1.67* 06/15/2014   No results found for: PTT   Assunta Found is seen for discussion of treatment options and the risks, benefits, complications of the proposed surgery.  I discussed the risks, benefits, and complications of various treatment options to include no treatment, total and subtotal extractions with alveoloplasty, alveoloplasty, pre-prosthetic surgery as needed, periodontal therapy, dental restorations, root canal therapy, chronic bridge therapy, implant therapy, and replacement of missing teeth is indicated after adequate healing and once medically stable from the anticipated heart valve surgery. The patient currently wishes to proceed with selective extraction of tooth numbers 3, 4, 5, 13, 14, 23, 24, 25, and 26 with alveoloplasty and gross debridement of remaining dentition in the operating room with general anesthesia.  Heparin therapy will be discontinued prior to the dental operating room procedure and then restarted on Friday morning following the surgery. The patient is aware of the risk for bleeding, bruising, swelling, infection, pain, nerve damage, soft tissue damage, damage to adjacent teeth, sinus involvement, root  tip fracture, mandible fracture, and the risk of general anesthesia. Patient is aware of the potential complications up to and including death due to his overall cardiovascular compromise.  SUBJECTIVE: The patient denies any acute medical or dental changes and agrees to proceed with treatment as planned.  EXAM: No sign of acute dental changes.  ASSESSMENT: Patient is affected by chronic apical periodontitis, retained root segments, dental caries, tooth mobility, chronic periodontitis, accretions, and malocclusion.  PLAN: 1. Patient agrees to proceed with treatment as planned in the operating room tomorrow morning at 7:15 AM with general anesthesia. 2. Heparin therapy will be discontinued on 07/22/2014 at 1:15 AM. Heparin therapy will then be held until Friday morning. 3. I also had a telephone discussion with the daughter , Zavon Hyson, concerning plan of care and potential for complications. She understands and is agreeable to the plan of care as described above.  [name], DDS

## 2014-07-04 NOTE — Patient Care Conference (Signed)
Inpatient RehabilitationTeam Conference and Plan of Care Update Date: 07/04/2014   Time: 11;10 AM    Patient Name: [name]      Medical Record Number: [id number]  Date of Birth: 1945/06/26 Sex: Male         Room/Bed: MCPO/NONE Payor Info: Payor: HU[employer] MEDICARE / Plan: [employer] GOLD PLUS HMO THN/NTSP / Product Type: *No Product type* /    Admitting Diagnosis: debility cardiac AORTIC STENOSIS CHRONIC PERIODONTITIS    Admit Date/Time:  06/29/2014  5:13 PM Admission Comments: No comment available   Primary Diagnosis:  Critical illness myopathy Principal Problem: Critical illness myopathy  Patient Active Problem List   Diagnosis Date Noted  . Chronic periodontitis 07/22/2014  . 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 Debility 06/29/2014  . Primary gout   . Gastric erosion with bleeding   . Acute respiratory failure, unspecified whether with hypoxia or hypercapnia   . Altered mental status   . Accelerated hypertension   . Aortic insufficiency   . Aortic stenosis   . Melena   . Gastric erosion   . Aortic valve insufficiency 06/14/2014  . Cardiogenic shock, possible 06/14/2014  . Hx of aortic aneurysm repair 06/14/2014  . Atrial fibrillation, do not know length of time 06/14/2014  . Elevated troponin 06/14/2014  . Positive D dimer 06/14/2014  . CHF exacerbation   . Encounter for screening colonoscopy 11/30/2012  . Duodenal ulcer 11/30/2012  . AORTIC VALVE DISORDERS 01/13/2010  . 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 OTHER CHEST PAIN 01/13/2010  . OTHER ACQUIRED ABSENCE OF ORGAN 01/13/2010    Expected Discharge Date: Expected Discharge Date: 07/11/14  Team Members Present: Physician leading conference: Dr. Alysia Penna Social Worker Present: Ovidio Kin, LCSW Nurse Present: Heather Roberts, RN PT Present: Raylene Everts, PT;Caroline Lacinda Axon, PT;Other (comment) Rudene Christians Tygielski-PT) OT Present: Simonne Come, Dorothyann Gibbs, OT SLP Present: Windell Moulding, SLP PPS Coordinator  present : Daiva Nakayama, RN, CRRN     Current Status/Progress Goal Weekly Team Focus  Medical   undergoing dental extractions in am, d/c heparin  Get strong enough for redo AVR  Coordinate   Bowel/Bladder   pt continent of bowel and bladder. LBM 6-7  manage bowel and bladder Mod I  contiune POC   Swallow/Nutrition/ Hydration     na        ADL's   mod A with LB dressing and bathing, mod A toilet transfers and toileting  min A with bathing, dressing, toilet transfers; mod A toileting  ADL training, activity tolerance, general strengthening   Mobility   mod assist basic transfers, min assist gait x 122' with RW, supervision w/c x 100', min guard 8 (3" high ) steps 2 rails; desats on room air to 75% with exertion, slowly returning to >90%  min assist overall tranfers and gait x 50', supervision w/c x 150', mod assist 4 or less steps  sit>< stands, transfers, activity tolerance, locomotion   Communication     na        Safety/Cognition/ Behavioral Observations    no unsafe behaviors        Pain   no complaints of pain. no PRNs         Skin   right IJ triple lumen. feet dry flakey skin.   remain free of skin breakdown min assist  assess skin q shift      *See Care Plan and progress notes for long and short-term goals.  Barriers to Discharge: no 24/7 care available  per daughter, unemployed son    Possible Resolutions to Barriers:  planning    Discharge Planning/Teaching Needs:  Family wants to take home, but unsure how will provide 24 hr care. Daughter works, tyring to piece together care for bedbound wife      Team Discussion:  Endurance better-knee's limit pt due to arthritis. Uses suction for secretions and to clear his throat. Dental extraction tomorrow. Goals-min level at times de-sats into the 80's. Surgery next week-MD to contact surgeon  Revisions to Treatment Plan:  Unsure if plan to return to CIR-does not have 24 hr care at home   Continued Need for Acute Rehabilitation  Level of Care: The patient requires daily medical management by a physician with specialized training in physical medicine and rehabilitation for the following conditions: Daily direction of a multidisciplinary physical rehabilitation program to ensure safe treatment while eliciting the highest outcome that is of practical value to the patient.: Yes Daily medical management of patient stability for increased activity during participation in an intensive rehabilitation regime.: Yes Daily analysis of laboratory values and/or radiology reports with any subsequent need for medication adjustment of medical intervention for : Neurological problems;Other;Cardiac problems  [name], [name] 07/19/2014, 1:51 PM

## 2014-07-04 NOTE — Progress Notes (Signed)
Social Work Patient ID: [name], male   DOB: 09-Jun-1945, 69 y.o.   MRN: [id number] Met with pt to inform of team conference and goals of min level, he realizes he will need 24 hr care at home and is unsure who would provide this. His son is there but not always Dependable. He has made progress while here, but is having teeth extracted tomorrow and then surgery next week. Daughter is aware Dad will provide care at home. She does work and is Trying to assist with pt's wife-her mom.  Unsure if after surgery pt should go to NH for longer and more rehab. Will continue to follow and assist with plans.

## 2014-07-04 NOTE — Progress Notes (Signed)
ANTICOAGULATION CONSULT NOTE - FOLLOW UP  Pharmacy Consult for Heparin Indication: DVT  No Known Allergies  Patient Measurements: Height: 5\' 8"  (172.7 cm) Weight: 227 lb 11.2 oz (103.284 kg) IBW/kg (Calculated) : 68.4 Heparin Dosing Weight: 90 kg   Vital Signs: Temp: 98.3 F (36.8 C) (06/08 0436) Temp Source: Oral (06/08 0436) BP: 137/75 mmHg (06/08 0436) Pulse Rate: 105 (06/08 1102)  Labs:  Recent Labs  07/02/14 0425 07/03/14 0530 07/04/14 0503  HGB 8.1* 8.1* 8.1*  HCT 25.0* 25.9* 25.8*  PLT 379 420* 411*  HEPARINUNFRC 0.55 0.30 0.39  CREATININE 1.12  --   --     Estimated Creatinine Clearance: 74.6 mL/min (by C-G formula based on Cr of 1.12).   Assessment: 70 YOM with history of AVR admitted with AV thrombus after stopping his warfarin 3 months ago - on his own. While being treated with heparin and integrilin for the clot, he developed GIB.  Anticoagulation was held and PRBC and PPI given.  He subsequently developed LUE DVT and now continuing on IV heparin with reduced heparin level goal given recent GIB. Heparin level 0.39, at goal, on 1450 units/hr. Hgb 8.1, stable, plt wnl. No further bleeding reported per chart. Plan for teeth extraction on 6/9 at 0700. Per Dr. Thea Silversmith, dentist note heparin to be discontinued 6 hrs prior to procedure, and restart Friday morning with no bolus. Aortic valve replacement on 6/27.  Goal of Therapy:  Heparin level 0.3-0.5 units/ml Monitor platelets by anticoagulation protocol: Yes    Plan:  - Continue heparin infusion 1450 units/hr. - Stop heparin at 0100 on 6/9 - F/u after dental procedure and restart heparin with no bolus Friday morning is no complications.  - F/u plans for long-term anticoagulation after AVR  Maryanna Shape, PharmD, BCPS  Clinical Pharmacist  Pager: [phone]  07/04/2014, 12:26 PM

## 2014-07-04 NOTE — Progress Notes (Signed)
New orders noted from DDS re: surgery, teeth extraction.  Reviewed with Dr. Letta Pate and Algis Liming, [facility]. Need to verify dates of orders vs. DDS progress note, procedure for consent.  PAC to follow up with Dr. Enrique Sack.

## 2014-07-05 ENCOUNTER — Inpatient Hospital Stay (HOSPITAL_COMMUNITY): Payer: Medicare PPO

## 2014-07-05 ENCOUNTER — Encounter (HOSPITAL_COMMUNITY): Payer: Self-pay

## 2014-07-05 ENCOUNTER — Inpatient Hospital Stay (HOSPITAL_COMMUNITY)
Admission: RE | Admit: 2014-07-05 | Discharge: 2014-07-27 | DRG: 981 | Disposition: E | Payer: Commercial Managed Care - HMO | Source: Ambulatory Visit | Attending: Internal Medicine | Admitting: Internal Medicine

## 2014-07-05 ENCOUNTER — Inpatient Hospital Stay (HOSPITAL_COMMUNITY): Payer: Commercial Managed Care - HMO | Admitting: Anesthesiology

## 2014-07-05 ENCOUNTER — Encounter (HOSPITAL_COMMUNITY)
Admission: AD | Disposition: A | Discharge status: Other Acute Inpatient Hospital | Payer: Commercial Managed Care - HMO | Source: Intra-hospital | Attending: Physical Medicine & Rehabilitation

## 2014-07-05 DIAGNOSIS — R570 Cardiogenic shock: Secondary | ICD-10-CM | POA: Diagnosis not present

## 2014-07-05 DIAGNOSIS — N179 Acute kidney failure, unspecified: Secondary | ICD-10-CM | POA: Diagnosis not present

## 2014-07-05 DIAGNOSIS — E871 Hypo-osmolality and hyponatremia: Secondary | ICD-10-CM | POA: Diagnosis not present

## 2014-07-05 DIAGNOSIS — K029 Dental caries, unspecified: Secondary | ICD-10-CM | POA: Diagnosis present

## 2014-07-05 DIAGNOSIS — I82622 Acute embolism and thrombosis of deep veins of left upper extremity: Secondary | ICD-10-CM | POA: Diagnosis present

## 2014-07-05 DIAGNOSIS — J189 Pneumonia, unspecified organism: Secondary | ICD-10-CM | POA: Diagnosis present

## 2014-07-05 DIAGNOSIS — K253 Acute gastric ulcer without hemorrhage or perforation: Secondary | ICD-10-CM | POA: Diagnosis not present

## 2014-07-05 DIAGNOSIS — Z952 Presence of prosthetic heart valve: Secondary | ICD-10-CM

## 2014-07-05 DIAGNOSIS — R633 Feeding difficulties, unspecified: Secondary | ICD-10-CM | POA: Diagnosis present

## 2014-07-05 DIAGNOSIS — E870 Hyperosmolality and hypernatremia: Secondary | ICD-10-CM | POA: Diagnosis present

## 2014-07-05 DIAGNOSIS — I129 Hypertensive chronic kidney disease with stage 1 through stage 4 chronic kidney disease, or unspecified chronic kidney disease: Secondary | ICD-10-CM | POA: Diagnosis present

## 2014-07-05 DIAGNOSIS — R52 Pain, unspecified: Secondary | ICD-10-CM

## 2014-07-05 DIAGNOSIS — Y95 Nosocomial condition: Secondary | ICD-10-CM | POA: Diagnosis present

## 2014-07-05 DIAGNOSIS — D62 Acute posthemorrhagic anemia: Secondary | ICD-10-CM | POA: Diagnosis present

## 2014-07-05 DIAGNOSIS — I10 Essential (primary) hypertension: Secondary | ICD-10-CM | POA: Diagnosis not present

## 2014-07-05 DIAGNOSIS — Z66 Do not resuscitate: Secondary | ICD-10-CM | POA: Diagnosis not present

## 2014-07-05 DIAGNOSIS — Z01818 Encounter for other preprocedural examination: Secondary | ICD-10-CM | POA: Diagnosis not present

## 2014-07-05 DIAGNOSIS — I330 Acute and subacute infective endocarditis: Principal | ICD-10-CM | POA: Diagnosis present

## 2014-07-05 DIAGNOSIS — Z960 Presence of urogenital implants: Secondary | ICD-10-CM | POA: Diagnosis not present

## 2014-07-05 DIAGNOSIS — Z86718 Personal history of other venous thrombosis and embolism: Secondary | ICD-10-CM | POA: Diagnosis not present

## 2014-07-05 DIAGNOSIS — J9601 Acute respiratory failure with hypoxia: Secondary | ICD-10-CM | POA: Diagnosis not present

## 2014-07-05 DIAGNOSIS — I4891 Unspecified atrial fibrillation: Secondary | ICD-10-CM | POA: Diagnosis present

## 2014-07-05 DIAGNOSIS — Z9114 Patient's other noncompliance with medication regimen: Secondary | ICD-10-CM | POA: Diagnosis present

## 2014-07-05 DIAGNOSIS — D649 Anemia, unspecified: Secondary | ICD-10-CM | POA: Diagnosis present

## 2014-07-05 DIAGNOSIS — B952 Enterococcus as the cause of diseases classified elsewhere: Secondary | ICD-10-CM | POA: Diagnosis present

## 2014-07-05 DIAGNOSIS — T45515A Adverse effect of anticoagulants, initial encounter: Secondary | ICD-10-CM | POA: Diagnosis present

## 2014-07-05 DIAGNOSIS — R319 Hematuria, unspecified: Secondary | ICD-10-CM | POA: Diagnosis present

## 2014-07-05 DIAGNOSIS — K921 Melena: Secondary | ICD-10-CM | POA: Diagnosis not present

## 2014-07-05 DIAGNOSIS — N183 Chronic kidney disease, stage 3 (moderate): Secondary | ICD-10-CM | POA: Diagnosis present

## 2014-07-05 DIAGNOSIS — K036 Deposits [accretions] on teeth: Secondary | ICD-10-CM | POA: Diagnosis present

## 2014-07-05 DIAGNOSIS — I350 Nonrheumatic aortic (valve) stenosis: Secondary | ICD-10-CM | POA: Diagnosis not present

## 2014-07-05 DIAGNOSIS — J9600 Acute respiratory failure, unspecified whether with hypoxia or hypercapnia: Secondary | ICD-10-CM | POA: Diagnosis not present

## 2014-07-05 DIAGNOSIS — E873 Alkalosis: Secondary | ICD-10-CM | POA: Diagnosis not present

## 2014-07-05 DIAGNOSIS — Z9119 Patient's noncompliance with other medical treatment and regimen: Secondary | ICD-10-CM | POA: Diagnosis not present

## 2014-07-05 DIAGNOSIS — Z9889 Other specified postprocedural states: Secondary | ICD-10-CM

## 2014-07-05 DIAGNOSIS — K045 Chronic apical periodontitis: Secondary | ICD-10-CM | POA: Diagnosis present

## 2014-07-05 DIAGNOSIS — R1310 Dysphagia, unspecified: Secondary | ICD-10-CM | POA: Diagnosis present

## 2014-07-05 DIAGNOSIS — I472 Ventricular tachycardia, unspecified: Secondary | ICD-10-CM

## 2014-07-05 DIAGNOSIS — Z8679 Personal history of other diseases of the circulatory system: Secondary | ICD-10-CM | POA: Diagnosis not present

## 2014-07-05 DIAGNOSIS — E43 Unspecified severe protein-calorie malnutrition: Secondary | ICD-10-CM | POA: Diagnosis not present

## 2014-07-05 DIAGNOSIS — G9340 Encephalopathy, unspecified: Secondary | ICD-10-CM | POA: Diagnosis present

## 2014-07-05 DIAGNOSIS — N189 Chronic kidney disease, unspecified: Secondary | ICD-10-CM | POA: Diagnosis present

## 2014-07-05 DIAGNOSIS — R34 Anuria and oliguria: Secondary | ICD-10-CM | POA: Diagnosis not present

## 2014-07-05 DIAGNOSIS — K920 Hematemesis: Secondary | ICD-10-CM | POA: Diagnosis present

## 2014-07-05 DIAGNOSIS — I130 Hypertensive heart and chronic kidney disease with heart failure and stage 1 through stage 4 chronic kidney disease, or unspecified chronic kidney disease: Secondary | ICD-10-CM | POA: Diagnosis present

## 2014-07-05 DIAGNOSIS — R1312 Dysphagia, oropharyngeal phase: Secondary | ICD-10-CM | POA: Diagnosis not present

## 2014-07-05 DIAGNOSIS — R7881 Bacteremia: Secondary | ICD-10-CM | POA: Diagnosis present

## 2014-07-05 DIAGNOSIS — I482 Chronic atrial fibrillation, unspecified: Secondary | ICD-10-CM | POA: Diagnosis present

## 2014-07-05 DIAGNOSIS — I469 Cardiac arrest, cause unspecified: Secondary | ICD-10-CM | POA: Diagnosis present

## 2014-07-05 DIAGNOSIS — N289 Disorder of kidney and ureter, unspecified: Secondary | ICD-10-CM | POA: Diagnosis not present

## 2014-07-05 DIAGNOSIS — Z7901 Long term (current) use of anticoagulants: Secondary | ICD-10-CM

## 2014-07-05 DIAGNOSIS — R4182 Altered mental status, unspecified: Secondary | ICD-10-CM | POA: Diagnosis not present

## 2014-07-05 DIAGNOSIS — K259 Gastric ulcer, unspecified as acute or chronic, without hemorrhage or perforation: Secondary | ICD-10-CM | POA: Diagnosis present

## 2014-07-05 DIAGNOSIS — D689 Coagulation defect, unspecified: Secondary | ICD-10-CM | POA: Diagnosis present

## 2014-07-05 DIAGNOSIS — Z79899 Other long term (current) drug therapy: Secondary | ICD-10-CM

## 2014-07-05 DIAGNOSIS — J690 Pneumonitis due to inhalation of food and vomit: Secondary | ICD-10-CM | POA: Diagnosis present

## 2014-07-05 DIAGNOSIS — Z8249 Family history of ischemic heart disease and other diseases of the circulatory system: Secondary | ICD-10-CM | POA: Diagnosis not present

## 2014-07-05 DIAGNOSIS — E872 Acidosis: Secondary | ICD-10-CM | POA: Diagnosis present

## 2014-07-05 DIAGNOSIS — Z98818 Other dental procedure status: Secondary | ICD-10-CM | POA: Diagnosis not present

## 2014-07-05 DIAGNOSIS — R531 Weakness: Secondary | ICD-10-CM | POA: Diagnosis not present

## 2014-07-05 DIAGNOSIS — IMO0002 Reserved for concepts with insufficient information to code with codable children: Secondary | ICD-10-CM | POA: Diagnosis present

## 2014-07-05 DIAGNOSIS — I351 Nonrheumatic aortic (valve) insufficiency: Secondary | ICD-10-CM

## 2014-07-05 DIAGNOSIS — R69 Illness, unspecified: Secondary | ICD-10-CM | POA: Diagnosis not present

## 2014-07-05 DIAGNOSIS — T85598D Other mechanical complication of other gastrointestinal prosthetic devices, implants and grafts, subsequent encounter: Secondary | ICD-10-CM

## 2014-07-05 DIAGNOSIS — I493 Ventricular premature depolarization: Secondary | ICD-10-CM | POA: Diagnosis not present

## 2014-07-05 DIAGNOSIS — K922 Gastrointestinal hemorrhage, unspecified: Secondary | ICD-10-CM | POA: Diagnosis present

## 2014-07-05 DIAGNOSIS — Z9081 Acquired absence of spleen: Secondary | ICD-10-CM | POA: Diagnosis present

## 2014-07-05 DIAGNOSIS — I481 Persistent atrial fibrillation: Secondary | ICD-10-CM | POA: Diagnosis present

## 2014-07-05 DIAGNOSIS — Z515 Encounter for palliative care: Secondary | ICD-10-CM | POA: Diagnosis not present

## 2014-07-05 DIAGNOSIS — E8770 Fluid overload, unspecified: Secondary | ICD-10-CM | POA: Insufficient documentation

## 2014-07-05 DIAGNOSIS — E876 Hypokalemia: Secondary | ICD-10-CM | POA: Diagnosis not present

## 2014-07-05 DIAGNOSIS — Z959 Presence of cardiac and vascular implant and graft, unspecified: Secondary | ICD-10-CM | POA: Diagnosis not present

## 2014-07-05 DIAGNOSIS — I272 Other secondary pulmonary hypertension: Secondary | ICD-10-CM | POA: Diagnosis present

## 2014-07-05 DIAGNOSIS — Z87891 Personal history of nicotine dependence: Secondary | ICD-10-CM | POA: Diagnosis not present

## 2014-07-05 DIAGNOSIS — J811 Chronic pulmonary edema: Secondary | ICD-10-CM

## 2014-07-05 DIAGNOSIS — T85598A Other mechanical complication of other gastrointestinal prosthetic devices, implants and grafts, initial encounter: Secondary | ICD-10-CM

## 2014-07-05 DIAGNOSIS — K0530 Chronic periodontitis, unspecified: Secondary | ICD-10-CM | POA: Diagnosis present

## 2014-07-05 DIAGNOSIS — Z954 Presence of other heart-valve replacement: Secondary | ICD-10-CM | POA: Diagnosis not present

## 2014-07-05 DIAGNOSIS — Z91199 Patient's noncompliance with other medical treatment and regimen due to unspecified reason: Secondary | ICD-10-CM | POA: Diagnosis present

## 2014-07-05 DIAGNOSIS — I5033 Acute on chronic diastolic (congestive) heart failure: Secondary | ICD-10-CM | POA: Diagnosis present

## 2014-07-05 DIAGNOSIS — R0602 Shortness of breath: Secondary | ICD-10-CM

## 2014-07-05 DIAGNOSIS — I7100 Dissection of unspecified site of aorta: Secondary | ICD-10-CM | POA: Diagnosis present

## 2014-07-05 DIAGNOSIS — I2510 Atherosclerotic heart disease of native coronary artery without angina pectoris: Secondary | ICD-10-CM | POA: Diagnosis present

## 2014-07-05 DIAGNOSIS — I5032 Chronic diastolic (congestive) heart failure: Secondary | ICD-10-CM | POA: Diagnosis present

## 2014-07-05 DIAGNOSIS — R739 Hyperglycemia, unspecified: Secondary | ICD-10-CM | POA: Diagnosis not present

## 2014-07-05 DIAGNOSIS — N390 Urinary tract infection, site not specified: Secondary | ICD-10-CM | POA: Diagnosis present

## 2014-07-05 DIAGNOSIS — J9690 Respiratory failure, unspecified, unspecified whether with hypoxia or hypercapnia: Secondary | ICD-10-CM

## 2014-07-05 DIAGNOSIS — Z4659 Encounter for fitting and adjustment of other gastrointestinal appliance and device: Secondary | ICD-10-CM

## 2014-07-05 DIAGNOSIS — I82629 Acute embolism and thrombosis of deep veins of unspecified upper extremity: Secondary | ICD-10-CM | POA: Diagnosis present

## 2014-07-05 DIAGNOSIS — I4729 Other ventricular tachycardia: Secondary | ICD-10-CM | POA: Diagnosis present

## 2014-07-05 DIAGNOSIS — K083 Retained dental root: Secondary | ICD-10-CM | POA: Diagnosis present

## 2014-07-05 DIAGNOSIS — Z452 Encounter for adjustment and management of vascular access device: Secondary | ICD-10-CM

## 2014-07-05 DIAGNOSIS — G7281 Critical illness myopathy: Secondary | ICD-10-CM | POA: Diagnosis not present

## 2014-07-05 HISTORY — PX: MULTIPLE EXTRACTIONS WITH ALVEOLOPLASTY: SHX5342

## 2014-07-05 LAB — TYPE AND SCREEN
ABO/RH(D): A POS
ANTIBODY SCREEN: NEGATIVE
UNIT DIVISION: 0
UNIT DIVISION: 0
Unit division: 0
Unit division: 0

## 2014-07-05 LAB — BASIC METABOLIC PANEL
ANION GAP: 10 (ref 5–15)
BUN: 12 mg/dL (ref 6–20)
CHLORIDE: 103 mmol/L (ref 101–111)
CO2: 20 mmol/L — ABNORMAL LOW (ref 22–32)
CREATININE: 1.28 mg/dL — AB (ref 0.61–1.24)
Calcium: 8.8 mg/dL — ABNORMAL LOW (ref 8.9–10.3)
GFR calc Af Amer: >60 mL/min (ref >60)
GFR calc non Af Amer: 56 mL/min — ABNORMAL LOW (ref >60)
Glucose, Bld: 119 mg/dL — ABNORMAL HIGH (ref 65–99)
Potassium: 4.5 mmol/L (ref 3.5–5.1)
SODIUM: 133 mmol/L — AB (ref 135–145)

## 2014-07-05 LAB — PREPARE RBC (CROSSMATCH)

## 2014-07-05 LAB — CBC
HEMATOCRIT: 24.7 % — AB (ref 39.0–52.0)
HEMOGLOBIN: 7.7 g/dL — AB (ref 13.0–17.0)
MCH: 27.6 pg (ref 26.0–34.0)
MCHC: 31.2 g/dL (ref 30.0–36.0)
MCV: 88.5 fL (ref 78.0–100.0)
Platelets: 376 10*3/uL (ref 150–400)
RBC: 2.79 MIL/uL — ABNORMAL LOW (ref 4.22–5.81)
RDW: 17.8 % — ABNORMAL HIGH (ref 11.5–15.5)
WBC: 4.8 10*3/uL (ref 4.0–10.5)

## 2014-07-05 LAB — HEMOGLOBIN AND HEMATOCRIT, BLOOD
HCT: 24.4 % — ABNORMAL LOW (ref 39.0–52.0)
Hemoglobin: 7.7 g/dL — ABNORMAL LOW (ref 13.0–17.0)

## 2014-07-05 LAB — MRSA PCR SCREENING: MRSA BY PCR: NEGATIVE

## 2014-07-05 SURGERY — MULTIPLE EXTRACTION WITH ALVEOLOPLASTY
Anesthesia: General

## 2014-07-05 MED ORDER — PROPOFOL 10 MG/ML IV BOLUS
INTRAVENOUS | Status: AC
  Filled 2014-07-05: qty 20

## 2014-07-05 MED ORDER — NEOSTIGMINE METHYLSULFATE 10 MG/10ML IV SOLN
INTRAVENOUS | Status: AC
  Filled 2014-07-05: qty 1

## 2014-07-05 MED ORDER — ROCURONIUM BROMIDE 50 MG/5ML IV SOLN
INTRAVENOUS | Status: AC
  Filled 2014-07-05: qty 1

## 2014-07-05 MED ORDER — LIDOCAINE HCL (CARDIAC) 20 MG/ML IV SOLN
INTRAVENOUS | Status: DC | PRN
  Administered 2014-07-05: 60 mg via INTRATRACHEAL

## 2014-07-05 MED ORDER — ONDANSETRON HCL 4 MG/2ML IJ SOLN: 4.0000 mg | Freq: Four times a day (QID) | INTRAMUSCULAR | Status: DC | PRN

## 2014-07-05 MED ORDER — ETOMIDATE 2 MG/ML IV SOLN
INTRAVENOUS | Status: DC | PRN
  Administered 2014-07-05: 12 mg via INTRAVENOUS

## 2014-07-05 MED ORDER — FENTANYL CITRATE (PF) 250 MCG/5ML IJ SOLN
INTRAMUSCULAR | Status: AC
  Filled 2014-07-05: qty 5

## 2014-07-05 MED ORDER — PHENYLEPHRINE HCL 10 MG/ML IJ SOLN
10.0000 mg | INTRAMUSCULAR | Status: DC | PRN
  Administered 2014-07-05: 50 ug/min via INTRAVENOUS

## 2014-07-05 MED ORDER — SODIUM CHLORIDE 0.9 % IJ SOLN
3.0000 mL | Freq: Two times a day (BID) | INTRAMUSCULAR | Status: DC
Start: 2014-07-05 — End: 2014-07-09
  Administered 2014-07-05 – 2014-07-06 (×2): 3 mL via INTRAVENOUS

## 2014-07-05 MED ORDER — SODIUM CHLORIDE 0.9 % IJ SOLN
3.0000 mL | Freq: Two times a day (BID) | INTRAMUSCULAR | Status: DC
  Administered 2014-07-05 – 2014-07-06 (×3): 3 mL via INTRAVENOUS

## 2014-07-05 MED ORDER — ACETAMINOPHEN 650 MG RE SUPP
650.0000 mg | Freq: Four times a day (QID) | RECTAL | Status: DC | PRN
  Administered 2014-07-05: 650 mg via RECTAL
  Filled 2014-07-05: qty 1

## 2014-07-05 MED ORDER — SODIUM CHLORIDE 0.9 % IV SOLN
250.0000 mL | INTRAVENOUS | Status: DC | PRN
  Administered 2014-07-06: 250 mL via INTRAVENOUS

## 2014-07-05 MED ORDER — HEMOSTATIC AGENTS (NO CHARGE) OPTIME
TOPICAL | Status: DC | PRN
  Administered 2014-07-05 (×2): 1 via TOPICAL

## 2014-07-05 MED ORDER — ETOMIDATE 2 MG/ML IV SOLN
INTRAVENOUS | Status: AC
  Filled 2014-07-05: qty 10

## 2014-07-05 MED ORDER — ROCURONIUM BROMIDE 100 MG/10ML IV SOLN
INTRAVENOUS | Status: DC | PRN
  Administered 2014-07-05: 20 mg via INTRAVENOUS
  Administered 2014-07-05: 30 mg via INTRAVENOUS

## 2014-07-05 MED ORDER — ONDANSETRON HCL 4 MG/2ML IJ SOLN
INTRAMUSCULAR | Status: AC
  Filled 2014-07-05: qty 2

## 2014-07-05 MED ORDER — FERROUS SULFATE 325 (65 FE) MG PO TBEC
325.0000 mg | DELAYED_RELEASE_TABLET | Freq: Every day | ORAL | Status: DC
  Administered 2014-07-07 – 2014-07-11 (×4): 325 mg via ORAL
  Filled 2014-07-05 (×7): qty 1

## 2014-07-05 MED ORDER — SODIUM CHLORIDE 0.9 % IV SOLN: Freq: Once | INTRAVENOUS | Status: DC

## 2014-07-05 MED ORDER — ACETAMINOPHEN 325 MG PO TABS
650.0000 mg | ORAL_TABLET | Freq: Four times a day (QID) | ORAL | Status: DC | PRN
Start: 2014-07-05 — End: 2014-07-06

## 2014-07-05 MED ORDER — WHITE PETROLATUM GEL
Status: AC
  Administered 2014-07-06: 0.2
  Filled 2014-07-05: qty 1

## 2014-07-05 MED ORDER — ONDANSETRON HCL 4 MG PO TABS: 4.0000 mg | ORAL_TABLET | Freq: Four times a day (QID) | ORAL | Status: DC | PRN

## 2014-07-05 MED ORDER — ONDANSETRON HCL 4 MG/2ML IJ SOLN
INTRAMUSCULAR | Status: DC | PRN
  Administered 2014-07-05: 4 mg via INTRAVENOUS

## 2014-07-05 MED ORDER — HYDROCODONE-ACETAMINOPHEN 5-325 MG PO TABS
1.0000 | ORAL_TABLET | ORAL | Status: DC | PRN
  Filled 2014-07-05: qty 2

## 2014-07-05 MED ORDER — ALBUMIN HUMAN 5 % IV SOLN
12.5000 g | Freq: Once | INTRAVENOUS | Status: AC
  Administered 2014-07-05: 12.5 g via INTRAVENOUS

## 2014-07-05 MED ORDER — STERILE WATER FOR INJECTION IJ SOLN
INTRAMUSCULAR | Status: AC
  Filled 2014-07-05: qty 10

## 2014-07-05 MED ORDER — GLYCOPYRROLATE 0.2 MG/ML IJ SOLN
INTRAMUSCULAR | Status: AC
  Filled 2014-07-05: qty 3

## 2014-07-05 MED ORDER — PHENYLEPHRINE 40 MCG/ML (10ML) SYRINGE FOR IV PUSH (FOR BLOOD PRESSURE SUPPORT)
PREFILLED_SYRINGE | INTRAVENOUS | Status: AC
  Filled 2014-07-05: qty 10

## 2014-07-05 MED ORDER — ALBUTEROL SULFATE (2.5 MG/3ML) 0.083% IN NEBU: 2.5000 mg | INHALATION_SOLUTION | RESPIRATORY_TRACT | Status: DC | PRN

## 2014-07-05 MED ORDER — BUPIVACAINE-EPINEPHRINE (PF) 0.5% -1:200000 IJ SOLN
INTRAMUSCULAR | Status: AC
  Filled 2014-07-05: qty 3.6

## 2014-07-05 MED ORDER — LIDOCAINE-EPINEPHRINE 2 %-1:100000 IJ SOLN
INTRAMUSCULAR | Status: AC
  Filled 2014-07-05: qty 10.2

## 2014-07-05 MED ORDER — FENTANYL CITRATE (PF) 100 MCG/2ML IJ SOLN
INTRAMUSCULAR | Status: DC | PRN
  Administered 2014-07-05 (×2): 50 ug via INTRAVENOUS

## 2014-07-05 MED ORDER — MIDAZOLAM HCL 2 MG/2ML IJ SOLN
INTRAMUSCULAR | Status: AC
  Filled 2014-07-05: qty 2

## 2014-07-05 MED ORDER — SUCCINYLCHOLINE CHLORIDE 20 MG/ML IJ SOLN
INTRAMUSCULAR | Status: AC
  Filled 2014-07-05: qty 1

## 2014-07-05 MED ORDER — ARTIFICIAL TEARS OP OINT
TOPICAL_OINTMENT | OPHTHALMIC | Status: AC
  Filled 2014-07-05: qty 3.5

## 2014-07-05 MED ORDER — ALBUMIN HUMAN 5 % IV SOLN
INTRAVENOUS | Status: AC
  Administered 2014-07-05: 12.5 g via INTRAVENOUS
  Filled 2014-07-05: qty 250

## 2014-07-05 MED ORDER — GLYCOPYRROLATE 0.2 MG/ML IJ SOLN
INTRAMUSCULAR | Status: DC | PRN
  Administered 2014-07-05: 0.6 mg via INTRAVENOUS

## 2014-07-05 MED ORDER — MORPHINE SULFATE 2 MG/ML IJ SOLN
1.0000 mg | INTRAMUSCULAR | Status: DC | PRN
  Administered 2014-07-05 – 2014-07-06 (×2): 1 mg via INTRAVENOUS
  Filled 2014-07-05 (×2): qty 1

## 2014-07-05 MED ORDER — SUCCINYLCHOLINE CHLORIDE 20 MG/ML IJ SOLN
INTRAMUSCULAR | Status: DC | PRN
  Administered 2014-07-05: 100 mg via INTRAVENOUS

## 2014-07-05 MED ORDER — BUPIVACAINE-EPINEPHRINE 0.5% -1:200000 IJ SOLN
INTRAMUSCULAR | Status: DC | PRN
  Administered 2014-07-05: 3.6 mL

## 2014-07-05 MED ORDER — LACTATED RINGERS IV SOLN
INTRAVENOUS | Status: DC | PRN
  Administered 2014-07-05 (×2): via INTRAVENOUS

## 2014-07-05 MED ORDER — 0.9 % SODIUM CHLORIDE (POUR BTL) OPTIME
TOPICAL | Status: DC | PRN
  Administered 2014-07-05: 1000 mL

## 2014-07-05 MED ORDER — CHLORHEXIDINE GLUCONATE CLOTH 2 % EX PADS
6.0000 | MEDICATED_PAD | Freq: Every day | CUTANEOUS | Status: DC
  Administered 2014-07-05: 6 via TOPICAL

## 2014-07-05 MED ORDER — PANTOPRAZOLE SODIUM 40 MG IV SOLR
40.0000 mg | Freq: Two times a day (BID) | INTRAVENOUS | Status: DC
  Administered 2014-07-05 – 2014-07-09 (×8): 40 mg via INTRAVENOUS
  Filled 2014-07-05 (×10): qty 40

## 2014-07-05 MED ORDER — EPHEDRINE SULFATE 50 MG/ML IJ SOLN
INTRAMUSCULAR | Status: AC
  Filled 2014-07-05: qty 1

## 2014-07-05 MED ORDER — LIDOCAINE-EPINEPHRINE 2 %-1:100000 IJ SOLN
INTRAMUSCULAR | Status: DC | PRN
  Administered 2014-07-05: 6.8 mL via INTRADERMAL

## 2014-07-05 MED ORDER — NEOSTIGMINE METHYLSULFATE 10 MG/10ML IV SOLN
INTRAVENOUS | Status: DC | PRN
  Administered 2014-07-05: 5 mg via INTRAVENOUS

## 2014-07-05 MED ORDER — SODIUM CHLORIDE 0.9 % IJ SOLN: 3.0000 mL | INTRAMUSCULAR | Status: DC | PRN

## 2014-07-05 MED ORDER — LIDOCAINE HCL (CARDIAC) 20 MG/ML IV SOLN
INTRAVENOUS | Status: AC
  Filled 2014-07-05: qty 5

## 2014-07-05 MED ORDER — AMINOCAPROIC ACID SOLUTION 5% (50 MG/ML)
10.0000 mL | ORAL | Status: DC
  Administered 2014-07-05: 10 mL via ORAL
  Filled 2014-07-05: qty 100

## 2014-07-05 MED ORDER — OXYMETAZOLINE HCL 0.05 % NA SOLN
NASAL | Status: AC
  Filled 2014-07-05: qty 15

## 2014-07-05 MED ORDER — HYDROMORPHONE HCL 1 MG/ML IJ SOLN: 0.2500 mg | INTRAMUSCULAR | Status: DC | PRN

## 2014-07-05 SURGICAL SUPPLY — 33 items
ALCOHOL 70% 16 OZ (MISCELLANEOUS) ×3 IMPLANT
ATTRACTOMAT 16X20 MAGNETIC DRP (DRAPES) ×3 IMPLANT
BLADE SURG 15 STRL LF DISP TIS (BLADE) ×2 IMPLANT
BLADE SURG 15 STRL SS (BLADE) ×4
COVER SURGICAL LIGHT HANDLE (MISCELLANEOUS) ×3 IMPLANT
GAUZE PACKING FOLDED 2  STR (GAUZE/BANDAGES/DRESSINGS) ×2
GAUZE PACKING FOLDED 2 STR (GAUZE/BANDAGES/DRESSINGS) ×1 IMPLANT
GAUZE SPONGE 4X4 16PLY XRAY LF (GAUZE/BANDAGES/DRESSINGS) ×3 IMPLANT
GLOVE BIOGEL PI IND STRL 6 (GLOVE) ×1 IMPLANT
GLOVE BIOGEL PI INDICATOR 6 (GLOVE) ×2
GLOVE SURG ORTHO 8.0 STRL STRW (GLOVE) ×3 IMPLANT
GLOVE SURG SS PI 6.0 STRL IVOR (GLOVE) ×3 IMPLANT
GOWN STRL REUS W/ TWL LRG LVL3 (GOWN DISPOSABLE) ×1 IMPLANT
GOWN STRL REUS W/TWL 2XL LVL3 (GOWN DISPOSABLE) ×3 IMPLANT
GOWN STRL REUS W/TWL LRG LVL3 (GOWN DISPOSABLE) ×2
HEMOSTAT SURGICEL 2X14 (HEMOSTASIS) ×3 IMPLANT
KIT BASIN OR (CUSTOM PROCEDURE TRAY) ×3 IMPLANT
KIT ROOM TURNOVER OR (KITS) ×3 IMPLANT
MANIFOLD NEPTUNE WASTE (CANNULA) ×3 IMPLANT
NEEDLE BLUNT 16X1.5 OR ONLY (NEEDLE) ×3 IMPLANT
NS IRRIG 1000ML POUR BTL (IV SOLUTION) ×3 IMPLANT
PACK EENT II TURBAN DRAPE (CUSTOM PROCEDURE TRAY) ×3 IMPLANT
PAD ARMBOARD 7.5X6 YLW CONV (MISCELLANEOUS) ×3 IMPLANT
SPONGE SURGIFOAM ABS GEL 100 (HEMOSTASIS) ×3 IMPLANT
SPONGE SURGIFOAM ABS GEL 12-7 (HEMOSTASIS) IMPLANT
SPONGE SURGIFOAM ABS GEL SZ50 (HEMOSTASIS) IMPLANT
SUCTION FRAZIER TIP 10 FR DISP (SUCTIONS) ×3 IMPLANT
SUT CHROMIC 3 0 PS 2 (SUTURE) ×9 IMPLANT
SYR 50ML SLIP (SYRINGE) ×3 IMPLANT
TOWEL OR 17X26 10 PK STRL BLUE (TOWEL DISPOSABLE) ×3 IMPLANT
TUBE CONNECTING 12'X1/4 (SUCTIONS) ×1
TUBE CONNECTING 12X1/4 (SUCTIONS) ×2 IMPLANT
YANKAUER SUCT BULB TIP NO VENT (SUCTIONS) ×3 IMPLANT

## 2014-07-05 NOTE — Anesthesia Postprocedure Evaluation (Signed)
  Anesthesia Post-op Note  Patient: [name]  Procedure(s) Performed: Procedure(s): Extraction of tooth #'s 3,4,5,13,14,21,23,24,25,26, with alveoloplasty and gross debridement of remaining teeth. (N/A)  Patient Location: PACU  Anesthesia Type:General  Level of Consciousness: awake and alert   Airway and Oxygen Therapy: Patient Spontanous Breathing  Post-op Pain: none  Post-op Assessment: Post-op Vital signs reviewed, Patient's Cardiovascular Status Stable and Respiratory Function Stable  Post-op Vital Signs: Reviewed  Filed Vitals:   07/02/2014 1237  BP: 100/71  Pulse: 82  Temp: 36.5 C  Resp: 22    Complications: No apparent anesthesia complications

## 2014-07-05 NOTE — Anesthesia Procedure Notes (Signed)
Procedure Name: Intubation Date/Time: 07/16/2014 8:05 AM Performed by: [place] Pink Pre-anesthesia Checklist: Patient identified, Emergency Drugs available, Suction available, Patient being monitored and Timeout performed Patient Re-evaluated:Patient Re-evaluated prior to inductionOxygen Delivery Method: Circle system utilized Preoxygenation: Pre-oxygenation with 100% oxygen Intubation Type: IV induction and Rapid sequence Laryngoscope Size: Mac and 4 Grade View: Grade I Tube type: Oral Tube size: 7.0 mm Number of attempts: 1 Airway Equipment and Method: Stylet and LTA kit utilized Placement Confirmation: ETT inserted through vocal cords under direct vision,  positive ETCO2 and breath sounds checked- equal and bilateral Secured at: 22 cm Tube secured with: Tape Dental Injury: Teeth and Oropharynx as per pre-operative assessment

## 2014-07-05 NOTE — Progress Notes (Signed)
Pt not coming back to CIR according to Dan-PA-after teeth extraction going to step-down unit to be monitored. Have updated [employer] and left message for daughter. Discharge plan was uncertain family trying to come up with 24 hr care, daughter working and already trying to care for mom at home who is homebound. Will talk with [facility] regarding plans.

## 2014-07-05 NOTE — Anesthesia Preprocedure Evaluation (Signed)
Anesthesia Evaluation  Patient identified by MRN, date of birth, ID band Patient awake    Reviewed: Allergy & Precautions, H&P , NPO status , Patient's Chart, lab work & pertinent test results  Airway Mallampati: III  TM Distance: >3 FB Neck ROM: Full    Dental no notable dental hx. (+) Poor Dentition, Dental Advisory Given   Pulmonary neg pulmonary ROS, former smoker,  breath sounds clear to auscultation  Pulmonary exam normal       Cardiovascular hypertension, Pt. on medications + CAD, + Peripheral Vascular Disease and +CHF Atrial Fibrillation + Valvular Problems/Murmurs AS and AI Rhythm:Regular Rate:Normal     Neuro/Psych negative neurological ROS  negative psych ROS   GI/Hepatic negative GI ROS, Neg liver ROS,   Endo/Other  negative endocrine ROS  Renal/GU negative Renal ROS  negative genitourinary   Musculoskeletal   Abdominal   Peds  Hematology negative hematology ROS (+) anemia ,   Anesthesia Other Findings   Reproductive/Obstetrics negative OB ROS                             Anesthesia Physical Anesthesia Plan  ASA: IV  Anesthesia Plan: General   Post-op Pain Management:    Induction: Intravenous  Airway Management Planned: Oral ETT  Additional Equipment: Arterial line  Intra-op Plan:   Post-operative Plan: Extubation in OR  Informed Consent: I have reviewed the patients History and Physical, chart, labs and discussed the procedure including the risks, benefits and alternatives for the proposed anesthesia with the patient or authorized representative who has indicated his/her understanding and acceptance.   Dental advisory given  Plan Discussed with: CRNA  Anesthesia Plan Comments:         Anesthesia Quick Evaluation

## 2014-07-05 NOTE — Op Note (Signed)
OPERATIVE REPORT  Patient:            [name] Date of Birth:  1945-11-14 MRN:                [id number]   DATE OF PROCEDURE:  07/22/2014  PREOPERATIVE DIAGNOSES: 1.   Aortic valve disorder 2.   Pre-aortic valve replacement dental protocol 3.   Anticoagulation 4.  Chronic apical periodontitis 5.  Dental caries 6.  Multiple retained roots segments 7.  Chronic periodontitis 8.  Accretions  POSTOPERATIVE DIAGNOSES: 1.   Aortic valve disorder 2.   Pre-aortic valve replacement dental protocol 3.   Anticoagulation 4.  Chronic apical periodontitis 5.  Dental caries 6.  Multiple retained roots segments 7.  Chronic periodontitis 8.  Accretions  OPERATIONS: 1. Multiple extraction of tooth numbers 3, 4, 5, 13, 14, 21, 23, 24, 25, and 26.  2. 4 Quadrants of alveoloplasty 3. Gross debridement of remaining dentition   SURGEON: [name], DDS  ASSISTANT: Camie Patience, (dental assistant)  ANESTHESIA: General anesthesia via oral endotracheal tube.  MEDICATIONS: 1. Ancef 2 g IV prior to invasive dental procedures. 2. Local anesthesia with a total utilization of 4 carpules each containing 34 mg of lidocaine with 0.017 mg of epinephrine as well as 2 carpules each containing 9 mg of bupivacaine with 0.009 mg of epinephrine.  SPECIMENS: There are 10 teeth that were discarded.  DRAINS: None  CULTURES: None  COMPLICATIONS: None   ESTIMATED BLOOD LOSS: 100 mLs.  INTRAVENOUS FLUIDS: 1000 mLs of Lactated ringers solution.  INDICATIONS: The patient was recently diagnosed with a failed aortic valve replacement. A medically necessary dental consultation was then requested to evaluate poor dentition as part of a pre-heart valve surgery dental protocol.  The patient was examined and treatment planned for multiple extractions with alveoloplasty and gross debridement of remaining dentition.  This treatment plan was formulated to decrease the risks and complications  associated with dental infection from affecting the patient's systemic health and anticipated aortic valve replacement surgery.  OPERATIVE FINDINGS: Patient was examined in operating room number 11.  The teeth were identified for extraction. It was determined that tooth numbers 3, 4, 5, 13, 14, 21, 23, 24, 25, and 26 would need extraction at this time. The patient was offered the choice of removing all remaining teeth at the time treatment options were discussed, but the patient adamantly refused this option. The patient was noted be affected by chronic periodontitis, accretions, chronic apical periodontitis, dental caries, and multiple retained root segments.   DESCRIPTION OF PROCEDURE: Patient was brought to the main operating room number 11. Patient was then placed in the supine position on the operating table. General anesthesia was then induced per the anesthesia team. The patient was then prepped and draped in the usual manner for dental medicine procedure. A timeout was performed. The patient was identified and procedures were verified. A throat pack was placed at this time. The oral cavity was then thoroughly examined with the findings noted above. The patient was then ready for dental medicine procedure as follows:  Local anesthesia was then administered sequentially with a total utilization of 4 carpules each containing 34 mg of lidocaine with 0.017 mg of epinephrine as well as 2 carpules  each containing 9 mg bupivacaine with 0.009 mg of epinephrine.  The Maxillary left and right quadrants first approached. Anesthesia was then delivered utilizing infiltration with lidocaine with epinephrine. A #15 blade incision was then made from the maxillary  right tuberosity and extended to the distal of #6.  A  surgical flap was then carefully reflected. Appropriate amounts of buccal and interseptal bone were then removed utilizing a surgical handpiece and bur and copious amounts of sterile water around  tooth numbers 3, 4, and 5.  The teeth were then subluxated with a series of straight elevators. Tooth numbers 3, 4, and 5 are then removed with a 150 forceps without complications. Alveoloplasty was then performed utilizing a rongeur and bone file. The tissues were approximated and trimmed appropriately. The surgical site was then irrigated with copious pulses sterile saline. A piece of Surgicel was placed in the extraction sockets. A piece of Surgifoam was then placed in the residual surgical site as indicated. The surgical site was then closed from the maxillary right tuberosity and extended to the distal of #6 utilizing 3-0 chromic gut suture in a continuous interrupted suture technique 1.  At this point time the maxillary left surgical site was approached. 15 blade incision was then made from the distal of #15 extended to the mesial of #12. A surgical flap was then carefully reflected. Tooth numbers 13 and 14 was subluxated with a series straight elevators. These teeth were then removed with a 150 forceps without complications. Alveoloplasty was then performed utilizing a ronguers and bone file. The surgical site was then irrigated with copious amounts of sterile saline. A piece of Surgicel placed in the extraction sockets appropriately. A piece of Surgifoam was then placed additionally in the extraction site as needed. The maxillary left surgical site was then closed from the distal of #15 and extended to the distal of #12 utilizing 3-0 chromic gut suture in a continuous interrupted suture technique 1.   At this point time, the mandibular quadrants were approached. The patient was given bilateral inferior alveolar nerve blocks and long buccal nerve blocks utilizing the bupivacaine with epinephrine. Further infiltration was then achieved utilizing the lidocaine with epinephrine. A 15 blade incision was then made from the distal of number 20 and extended to the distal of #22.  A second 15 blade incision was  extended to the mesial #22 and extended to the mesial of #27. The surgical flaps were then carefully reflected. The lower teeth were then subluxated with a series of straight elevators. Tooth numbers 21, 23, 24, 25, and 26 were then removed without complications with a 341 forceps.  Alveoloplasty was then performed utilizing a rongeur and bone file. The surgical sites were then irrigated with copious amounts of sterile saline. A piece of Surgicel was then placed in each extraction socket appropriately. The mandibular left surgical site was then closed from the distal of #20 and extended to the distal of #22 utilizing 3-0 chromic gut suture in a continuous interrupted suture technique 1. The maxillary anterior segment was then closed from the mesial #22 and extended to the mesial #27 utilizing 3-0 chromic gut suture in a continuous interrupted suture technique 1. 2 individual interrupted sutures are then placed to further close the surgical site as needed.  At this point in time, the remaining dentition was approached. A sonic scaler was used to remove significant accretions. A series of hand curettes were utilized to further remove accretions. A sonic scaler was then again used to further refine removal of accretions. This completed the gross debridement procedure.   At this point time, the entire mouth was irrigated with copious amounts of sterile saline. The patient was examined for complications, seeing none, the dental medicine procedure  was deemed to be complete. The throat pack was removed at this time. An oral airway was then placed at the request of the anesthesia team. A series of 4 x 4 gauze moistened with Amicar 5% rinse were placed in the mouth to aid hemostasis. The patient was then handed over to the anesthesia team for final disposition. After an appropriate amount of time, the patient was extubated and taken to the postanesthsia care unit in good condition.  All counts were correct for the  dental medicine procedure. The patient is to continue Amicar 5% oral rinses postoperatively. Patient is to rinse with 10 ML's every hour for the next 10 hours and then as needed for persistent oral oozing. Patient is to use in a swish and spit manner. Do not swallow. Heparin therapy will be restarted tomorrow morning if no significant oral oozing. Patient will take hydrocodone/acetaminophen 5/325 for pain medication. Patient is to use one to 2 tablets every 6 hours as needed for pain. Additional pain medication may be prescribed by the medical team as indicated for severe pain. Patient will be scheduled for aortic valve replacement at the discretion of Dr. Tharon Aquas Trigt.   [name], DDS.

## 2014-07-05 NOTE — H&P (Addendum)
[facility] History and Physical  [name] ERD:[id number] DOB: February 17, 1945 DOA: 07/19/2014  Referring physician: Dr Enrique Sack PCP: [name], MD   Chief Complaint: s/p teeth extraction, pain.   HPI: [name] is a 69 y.o. 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 Plan to have AVR redo by Dr Nils Pyle at the end of June. He underwent Multiple extraction of tooth numbers 3, 4, 5, 13, 14, 21, 23, 24, 25, and 26. , 4 Quadrants of alveoloplasty, Gross debridement of remaining dentition in preparation of aortic valve replacement 6-09. Patient will be admitted to step down unit for observation. Patient has been receiving heparin Gtt while in CIR for Mechanical Valve. Heparin was stop prior to procedure. Dr Enrique Sack discussed care with Dr Nils Pyle, plan is to hold heparin until tomorrow morning.   Patient is mildly sedated, he is able to follow commands, and answer questions. He denies chest pain or dyspnea, no abdominal pain. Complaining of pain in his mouth.    Review of Systems:  Negative, except as per HPI.   Past Medical History  Diagnosis Date  . Chest pain   . Hypertension   . Aortic aneurysm 1993  . 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 Pneumonia   . Aortic valve replaced      05/2014 transfered from [place] to [facility] for HF in the setting of failed valve repair. 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 CT surgery considering AVR, delayed due to GI bleed  . Hx of aortic aneurysm repair 06/14/2014  . Atrial fibrillation 05/2014  . Gastritis and gastroduodenitis with hemorrhage 05/2014    severe.   . Tubular adenoma of colon 12/2012  . DVT of upper extremity (deep vein  thrombosis) 05/2014    left  . Acute blood loss anemia 05/2014   . CAD (coronary artery disease)     minimal CAD on cath 05/2014   Past Surgical History  Procedure Laterality Date  . Abdominal aortic aneurysm repair  1993    At [facility], Unspecified  . Splenectomy    . Cardiac valve replacement      At [facility]  . Esophagogastroduodenoscopy  Aug 2014    [facility]: large duodenal ulcer with visible vessel, s/p clip and epi, erosive gastritis. +h.pylori serology, treated with amoxicillin and biaxin.   . Colonoscopy N/A 12/27/2012    Procedure: COLONOSCOPY;  Surgeon: Danie Binder, MD;  Location: [facility];  Service: Endoscopy;  Laterality: N/A;  8:45-moved to 855   . Tee without cardioversion N/A 06/15/2014    Procedure: TRANSESOPHAGEAL ECHOCARDIOGRAM (TEE);  Surgeon: Sueanne Margarita, MD;  Location: [facility] ENDOSCOPY;  Service: Cardiovascular;  Laterality: N/A;  . Cardiac catheterization N/A 06/15/2014    Procedure: Fluoroscopy Guidance;  Surgeon: Josue Hector, MD;  Location: [place] CV LAB;  Service: Cardiovascular;  Laterality: N/A;  . Esophagogastroduodenoscopy N/A 06/18/2014    Procedure: ESOPHAGOGASTRODUODENOSCOPY (EGD);  Surgeon: Jerene Bears, MD;  Location: [facility] ENDOSCOPY;  Service: Endoscopy;  Laterality: N/A;  . Peripheral vascular catheterization  06/19/2014    Procedure: Aortic Arch Angiography;  Surgeon: Jettie Booze, MD;  Location: [place] CV LAB;  Service: Cardiovascular;;  . Esophagogastroduodenoscopy N/A 06/21/2014  Procedure: ESOPHAGOGASTRODUODENOSCOPY (EGD);  Surgeon: Jerene Bears, MD;  Location: [facility] ENDOSCOPY;  Service: Endoscopy;  Laterality: N/A;  . Esophagogastroduodenoscopy N/A 06/21/2014    Procedure: ESOPHAGOGASTRODUODENOSCOPY (EGD);  Surgeon: Jerene Bears, MD;  Location: [place];  Service: Gastroenterology;  Laterality: N/A;   Social History:  reports that he has quit smoking. He does not have any smokeless tobacco history on file. He reports  that he drinks alcohol. He reports that he does not use illicit drugs.  No Known Allergies  Family History  Problem Relation Age of Onset  . Hypertension Father 71  . Diabetes Mother 84  . Colon cancer Neg Hx     Prior to Admission medications   Medication Sig Start Date End Date Taking? Authorizing Provider  ferrous sulfate 325 (65 FE) MG EC tablet Take 325 mg by mouth every other day.    Yes Historical Provider, MD  heparin 100-0.45 UNIT/ML-% infusion Inject 1,300 Units/hr into the vein continuous. 06/29/14   Almyra Deforest, PA  ondansetron (ZOFRAN) 4 MG/2ML SOLN injection Inject 2 mLs (4 mg total) into the vein every 6 (six) hours as needed for nausea or vomiting. 06/29/14   Almyra Deforest, PA  pantoprazole (PROTONIX) 40 MG injection Inject 40 mg into the vein every 12 (twelve) hours. 06/29/14   Almyra Deforest, PA   Physical Exam: Filed Vitals:   06/27/2014 1718 07/08/2014 1754  BP: 155/84   Pulse: 78   Temp: 98.6 F (37 C) 99.3 F (37.4 C)  TempSrc: Oral Oral  Resp: 22   SpO2: 100%     Wt Readings from Last 3 Encounters:  07/03/2014 102.513 kg (226 lb)  06/29/14 99.8 kg (220 lb 0.3 oz)  12/27/12 101.152 kg (223 lb)    General:  Appears calm and comfortable, mildly sedated.  Eyes: PERRL, normal lids, irises & conjunctiva ENT: grossly normal hearing, lips & tongue dry, dry blood mouth. Multiple teeth missing.  Neck: no LAD, masses or thyromegaly Cardiovascular: IRR, no /r/g, positive for murmur. . No LE edema. Respiratory: CTA bilaterally, no w/r/r. Normal respiratory effort. Abdomen: soft, ntnd Skin: no rash or induration seen on limited exam Musculoskeletal: grossly normal tone BUE/BLE. Left Upper extremity with edema.  Neurologic: grossly non-focal. Alert follows command. Able to move all 4 extremity, difficulty moving left arm, he relates is heavy and chronic problem. Speech appears clear.           Labs on Admission:  Basic Metabolic Panel:  Recent Labs Lab 06/29/14 0435  07/02/14 0425  NA 133* 135  K 3.7 4.0  CL 102 104  CO2 22 23  GLUCOSE 97 100*  BUN 10 8  CREATININE 1.04 1.12  CALCIUM 8.6* 8.9   Liver Function Tests:  Recent Labs Lab 06/29/14 0435 07/02/14 0425  AST 25 21  ALT 17 14*  ALKPHOS 49 49  BILITOT 0.5 0.8  PROT 6.3* 6.3*  ALBUMIN 2.5* 2.5*   No results for input(s): LIPASE, AMYLASE in the last 168 hours. No results for input(s): AMMONIA in the last 168 hours. CBC:  Recent Labs Lab 07/01/14 0537 07/02/14 0425 07/03/14 0530 07/04/14 0503 06/29/2014 0633 07/12/2014 1115  WBC 7.9 7.6 7.2 7.8 4.8  --   NEUTROABS  --  4.6  --   --   --   --   HGB 7.9* 8.1* 8.1* 8.1* 7.7* 7.7*  HCT 24.7* 25.0* 25.9* 25.8* 24.7* 24.4*  MCV 88.8 89.9 88.7 88.1 88.5  --   PLT 381 379 420*  411* 376  --    Cardiac Enzymes: No results for input(s): CKTOTAL, CKMB, CKMBINDEX, TROPONINI in the last 168 hours.  BNP (last 3 results)  Recent Labs  06/14/14 2105  BNP 1438.6*    ProBNP (last 3 results) No results for input(s): PROBNP in the last 8760 hours.  CBG: No results for input(s): GLUCAP in the last 168 hours.  Radiological Exams on Admission: Dg Knee 1-2 Views Left  07/03/2014   CLINICAL DATA:  69 year old male with generalized left knee pain and swelling progressive the past few days.  EXAM: LEFT KNEE - 1-2 VIEW  COMPARISON:  None.  FINDINGS: Suspect small suprapatellar joint effusion. No evidence acute fracture or malalignment. There is narrowing of the lateral compartmental joint space with subchondral sclerosis and subtle irregularity of the cortical surface of the lateral femoral condyle and lateral tibial plateau. Mild osteophyte formation in the patellofemoral compartment. No evidence of acute fracture or malalignment. No focal soft tissue abnormality.  IMPRESSION: 1. Small suprapatellar knee joint effusion which may be degenerative, or infectious. 2. Degenerative changes relatively focal to the lateral compartments with significant  narrowing of the joint space and subtle irregularity of the cortical margin of the lateral femoral condyle and lateral tibial plateau. This is favored to represent advanced degenerative change with chondromalacia and secondary bony changes.   Electronically Signed   By: Jacqulynn Cadet [name]   On: 07/03/2014 18:43    EKG: Independently reviewed. Will order EKG  Assessment/Plan Active Problems:   Hx of aortic aneurysm repair   Atrial fibrillation, do not know length of time   Gastric erosion   Acute respiratory failure, unspecified whether with hypoxia or hypercapnia   Aortic insufficiency   Anemia  1-Aortic valve insufficiency, Mechanical Aortic valve with malfunction.  Holding heparin post surgical dental procedure. I have discussed care with Dr Enrique Sack. He discussed care with Dr Nils Pyle ok to hold heparin Gtt until tomorrow morning, resume heparin low rate.  -Order for heparin need to be place in Am.   2-Multiple dental extraction;  Management per Dr Enrique Sack.  Amicar Mouth wash every hour. I have discussed this with nurse.   3-Anemia; Acute blood loss, post dental procedure.  Repeat Labs in am/  Patient receiving one unit of PRBC.   4-History of DVT Upper extremity; left brachial and axillary vein [place] 5-29 Need to resume heparin in am.   5-History of A fib; rate controlled. I have ask pharmacy to do med rec.   6-gastric cardia erosion, diagnosed during last admission. Will resume Protonix, IV while NPO.   Code Status: full code.  DVT Prophylaxis:SCD for now, resume Heparin in am with no bolus.  Family Communication: none at bedside at time of evaluation.  Disposition Plan: expect 2 to 3 days inpatient.   Time spent: 75 minutes.   Niel Hummer A [facility] Pager [phone]

## 2014-07-05 NOTE — Discharge Summary (Signed)
Discharge summary job [id number]

## 2014-07-05 NOTE — Progress Notes (Signed)
[name], j (dob 20-Apr-1945) is sent for direct admission from PACU after teeth extraction by Dr. Enrique Sack. Patient was recently discharged from [facility] to [facility] after two weeks of hospitalization with dysfunctional mechanical aortic valve/cardiogenic shock/afib/GI bleed (arterial bleed s/p clipping, requiring repeated prbc transfusion), plan to have ReDo AVR by Dr. Nils Pyle at the end of June, today he is here for teeth extraction, CIR prefers patient to be admitted to the hospital post procedure to ensure stabilization.  Patient was discharged to CIR oh heparin drip which was stopped prior to teeth extraction, anticoagulation can be restarted tomorrow per Dr. Enrique Sack. Per Dr. Nils Pyle, can use lovenox for chronic anticoagulation piror to AVR Redo, need to monitor h/h, transfuse prn.

## 2014-07-05 NOTE — Discharge Instructions (Signed)

## 2014-07-05 NOTE — Progress Notes (Signed)
PRE-OPERATIVE NOTE:  07/19/2014 [name] [id number]  VITALS: BP 108/65 mmHg  Pulse 99  Temp(Src) 99.8 F (37.7 C) (Oral)  Resp 18  Ht 5\' 8"  (1.727 m)  Wt 226 lb (102.513 kg)  BMI 34.37 kg/m2  SpO2 97%  Lab Results  Component Value Date   WBC 4.8 07/17/2014   HGB 7.7* 06/28/2014   HCT 24.7* 07/22/2014   MCV 88.5 07/04/2014   PLT 376 07/13/2014   BMET    Component Value Date/Time   NA 135 07/02/2014 0425   K 4.0 07/02/2014 0425   CL 104 07/02/2014 0425   CO2 23 07/02/2014 0425   GLUCOSE 100* 07/02/2014 0425   BUN 8 07/02/2014 0425   CREATININE 1.12 07/02/2014 0425   CALCIUM 8.9 07/02/2014 0425   GFRNONAA >60 07/02/2014 0425   GFRAA >60 07/02/2014 0425    Lab Results  Component Value Date   INR 1.37 06/21/2014   INR 1.67* 06/15/2014   No results [name] for: PTT   [name] presents for dental procedures in the operating room.   SUBJECTIVE: The patient denies any acute medical or dental changes and agrees to proceed with treatment as planned.  EXAM: No sign of acute dental changes.  ASSESSMENT: Patient is affected by chronic apical periodontitis, multiple retained root segments, dental caries, chronic periodontitis, and accretions.  PLAN: Patient agrees to proceed with treatment as planned in the operating room as previously discussed and accepts the risks, benefits, and complications of the proposed treatment. Patient is aware of the risk for bleeding, bruising, swelling, infection, pain, nerve damage, soft tissue damage, damage to adjacent teeth, sinus involvement, root tip fracture, mandible fracture, and the risks of complications associated with the anesthesia. Patient is aware of the potential competitions up to and including death to his overall cardiovascular compromise. Patient also is aware of the potential for other complications not mentioned above.   [name], DDS

## 2014-07-05 NOTE — Progress Notes (Signed)
Pt not coming back to CIR postoperatively. I wil follow up with pt and MD to assist with dispo. [phone]

## 2014-07-05 NOTE — Progress Notes (Signed)
Pt sent to OR with consent form unsigned. When trying to get consent pt asked RN what the terms meant. OR is aware that consent form is not signed and that dentist needs to clarify procedure for pt. CHG bath given. Pts glasses sent with pt to OR for him to see to sign consent form. Will give report to primary day nurse. Kennieth Francois, RN

## 2014-07-05 NOTE — Progress Notes (Signed)
Pt last void volume was unknown and pt unable to void after 8 hours. Pt said he didn't feel the urge to void, but still attempted. Bladder scan with a volume of 396 and I&O cath for 400 at 0450 this am. Day shift RN is aware of last cath time and will monitor. Kennieth Francois, RN

## 2014-07-05 NOTE — Transfer of Care (Signed)
Immediate Anesthesia Transfer of Care Note  Patient: BARNIE SOPKO  Procedure(s) Performed: Procedure(s): Extraction of tooth #'s 3,4,5,13,14,21,23,24,25,26, with alveoloplasty and gross debridement of remaining teeth. (N/A)  Patient Location: PACU  Anesthesia Type:General  Level of Consciousness: sedated  Airway & Oxygen Therapy: Patient placed on Ventilator (see vital sign flow sheet for setting) and pt remains intubated  Post-op Assessment: Report given to RN and Post -op Vital signs reviewed and stable  Post vital signs: Reviewed and stable  Last Vitals:  Filed Vitals:   07/26/2014 1005  BP:   Pulse: 117  Temp:   Resp: 27    Complications: No apparent anesthesia complications

## 2014-07-06 ENCOUNTER — Inpatient Hospital Stay (HOSPITAL_COMMUNITY): Payer: Commercial Managed Care - HMO | Admitting: Critical Care Medicine

## 2014-07-06 ENCOUNTER — Inpatient Hospital Stay (HOSPITAL_COMMUNITY): Payer: Commercial Managed Care - HMO

## 2014-07-06 ENCOUNTER — Encounter (HOSPITAL_COMMUNITY): Payer: Self-pay | Admitting: Dentistry

## 2014-07-06 DIAGNOSIS — I472 Ventricular tachycardia, unspecified: Secondary | ICD-10-CM

## 2014-07-06 DIAGNOSIS — J9600 Acute respiratory failure, unspecified whether with hypoxia or hypercapnia: Secondary | ICD-10-CM

## 2014-07-06 DIAGNOSIS — Z9889 Other specified postprocedural states: Secondary | ICD-10-CM

## 2014-07-06 DIAGNOSIS — Z01818 Encounter for other preprocedural examination: Secondary | ICD-10-CM

## 2014-07-06 DIAGNOSIS — I481 Persistent atrial fibrillation: Secondary | ICD-10-CM

## 2014-07-06 DIAGNOSIS — I350 Nonrheumatic aortic (valve) stenosis: Secondary | ICD-10-CM

## 2014-07-06 DIAGNOSIS — I351 Nonrheumatic aortic (valve) insufficiency: Secondary | ICD-10-CM

## 2014-07-06 LAB — BLOOD GAS, ARTERIAL
ACID-BASE DEFICIT: 5.2 mmol/L — AB (ref 0.0–2.0)
BICARBONATE: 19.8 meq/L — AB (ref 20.0–24.0)
DRAWN BY: 129711
FIO2: 0.6 %
MECHVT: 550 mL
O2 Saturation: 99.7 %
PATIENT TEMPERATURE: 98.6
PEEP: 5 cmH2O
PH ART: 7.316 — AB (ref 7.350–7.450)
RATE: 14 resp/min
TCO2: 21.1 mmol/L (ref 0–100)
pCO2 arterial: 40 mmHg (ref 35.0–45.0)
pO2, Arterial: 182 mmHg — ABNORMAL HIGH (ref 80.0–100.0)

## 2014-07-06 LAB — COMPREHENSIVE METABOLIC PANEL
ALBUMIN: 2.6 g/dL — AB (ref 3.5–5.0)
ALBUMIN: 2.7 g/dL — AB (ref 3.5–5.0)
ALK PHOS: 47 U/L (ref 38–126)
ALT: 12 U/L — ABNORMAL LOW (ref 17–63)
ALT: 14 U/L — AB (ref 17–63)
AST: 21 U/L (ref 15–41)
AST: 27 U/L (ref 15–41)
Alkaline Phosphatase: 47 U/L (ref 38–126)
Anion gap: 9 (ref 5–15)
Anion gap: 9 (ref 5–15)
BILIRUBIN TOTAL: 0.7 mg/dL (ref 0.3–1.2)
BUN: 12 mg/dL (ref 6–20)
BUN: 12 mg/dL (ref 6–20)
CO2: 23 mmol/L (ref 22–32)
CO2: 21 mmol/L — ABNORMAL LOW (ref 22–32)
CREATININE: 1.22 mg/dL (ref 0.61–1.24)
CREATININE: 1.46 mg/dL — AB (ref 0.61–1.24)
Calcium: 8.7 mg/dL — ABNORMAL LOW (ref 8.9–10.3)
Calcium: 8.8 mg/dL — ABNORMAL LOW (ref 8.9–10.3)
Chloride: 102 mmol/L (ref 101–111)
Chloride: 103 mmol/L (ref 101–111)
GFR calc Af Amer: >60 mL/min (ref >60)
GFR calc Af Amer: 56 mL/min — ABNORMAL LOW (ref >60)
GFR calc non Af Amer: 60 mL/min — ABNORMAL LOW (ref >60)
GFR calc non Af Amer: 48 mL/min — ABNORMAL LOW (ref >60)
GLUCOSE: 119 mg/dL — AB (ref 65–99)
Glucose, Bld: 128 mg/dL — ABNORMAL HIGH (ref 65–99)
POTASSIUM: 4.6 mmol/L (ref 3.5–5.1)
POTASSIUM: 4.7 mmol/L (ref 3.5–5.1)
SODIUM: 134 mmol/L — AB (ref 135–145)
Sodium: 133 mmol/L — ABNORMAL LOW (ref 135–145)
Total Bilirubin: 1 mg/dL (ref 0.3–1.2)
Total Protein: 6.4 g/dL — ABNORMAL LOW (ref 6.5–8.1)
Total Protein: 6.7 g/dL (ref 6.5–8.1)

## 2014-07-06 LAB — TYPE AND SCREEN
ABO/RH(D): A POS
Antibody Screen: NEGATIVE
Unit division: 0

## 2014-07-06 LAB — CBC
HCT: 28.3 % — ABNORMAL LOW (ref 39.0–52.0)
HCT: 28.5 % — ABNORMAL LOW (ref 39.0–52.0)
HEMOGLOBIN: 8.9 g/dL — AB (ref 13.0–17.0)
Hemoglobin: 8.9 g/dL — ABNORMAL LOW (ref 13.0–17.0)
MCH: 27.9 pg (ref 26.0–34.0)
MCH: 27.9 pg (ref 26.0–34.0)
MCHC: 31.4 g/dL (ref 30.0–36.0)
MCHC: 31.2 g/dL (ref 30.0–36.0)
MCV: 88.7 fL (ref 78.0–100.0)
MCV: 89.3 fL (ref 78.0–100.0)
Platelets: 303 10*3/uL (ref 150–400)
Platelets: 256 10*3/uL (ref 150–400)
RBC: 3.19 MIL/uL — ABNORMAL LOW (ref 4.22–5.81)
RBC: 3.19 MIL/uL — AB (ref 4.22–5.81)
RDW: 17.6 % — AB (ref 11.5–15.5)
RDW: 17.5 % — ABNORMAL HIGH (ref 11.5–15.5)
WBC: 7.8 10*3/uL (ref 4.0–10.5)
WBC: 9.7 10*3/uL (ref 4.0–10.5)

## 2014-07-06 LAB — GLUCOSE, CAPILLARY
GLUCOSE-CAPILLARY: 120 mg/dL — AB (ref 65–99)
Glucose-Capillary: 141 mg/dL — ABNORMAL HIGH (ref 65–99)
Glucose-Capillary: 80 mg/dL (ref 65–99)
Glucose-Capillary: 95 mg/dL (ref 65–99)

## 2014-07-06 LAB — URINE MICROSCOPIC-ADD ON

## 2014-07-06 LAB — URINALYSIS, ROUTINE W REFLEX MICROSCOPIC
GLUCOSE, UA: NEGATIVE mg/dL
HGB URINE DIPSTICK: NEGATIVE
Ketones, ur: NEGATIVE mg/dL
Leukocytes, UA: NEGATIVE
NITRITE: NEGATIVE
PROTEIN: 30 mg/dL — AB
SPECIFIC GRAVITY, URINE: 1.02 (ref 1.005–1.030)
Urobilinogen, UA: 1 mg/dL (ref 0.0–1.0)
pH: 5 (ref 5.0–8.0)

## 2014-07-06 LAB — TROPONIN I
TROPONIN I: 0.07 ng/mL — AB (ref <0.031)
Troponin I: 0.06 ng/mL — ABNORMAL HIGH (ref <0.031)

## 2014-07-06 LAB — HEPARIN LEVEL (UNFRACTIONATED): Heparin Unfractionated: 0.21 IU/mL — ABNORMAL LOW (ref 0.30–0.70)

## 2014-07-06 LAB — MAGNESIUM: Magnesium: 1.8 mg/dL (ref 1.7–2.4)

## 2014-07-06 MED ORDER — ACETAMINOPHEN 160 MG/5ML PO SOLN: 650.0000 mg | Freq: Four times a day (QID) | ORAL | Status: DC | PRN

## 2014-07-06 MED ORDER — MIDAZOLAM HCL 2 MG/2ML IJ SOLN: 1.0000 mg | INTRAMUSCULAR | Status: DC | PRN

## 2014-07-06 MED ORDER — SODIUM CHLORIDE 0.9 % IJ SOLN: 10.0000 mL | INTRAMUSCULAR | Status: DC | PRN

## 2014-07-06 MED ORDER — HEPARIN (PORCINE) IN NACL 100-0.45 UNIT/ML-% IJ SOLN
1650.0000 [IU]/h | INTRAMUSCULAR | Status: DC
  Administered 2014-07-06: 1450 [IU]/h via INTRAVENOUS
  Administered 2014-07-07 (×2): 1650 [IU]/h via INTRAVENOUS
  Filled 2014-07-06 (×6): qty 250

## 2014-07-06 MED ORDER — ENOXAPARIN SODIUM 60 MG/0.6ML ~~LOC~~ SOLN: 60.0000 mg | Freq: Two times a day (BID) | SUBCUTANEOUS | Status: DC

## 2014-07-06 MED ORDER — FENTANYL CITRATE (PF) 100 MCG/2ML IJ SOLN: 50.0000 ug | INTRAMUSCULAR | Status: DC | PRN

## 2014-07-06 MED ORDER — MORPHINE SULFATE 2 MG/ML IJ SOLN: 1.0000 mg | INTRAMUSCULAR | Status: DC | PRN

## 2014-07-06 MED ORDER — SODIUM CHLORIDE 0.9 % IJ SOLN
10.0000 mL | Freq: Two times a day (BID) | INTRAMUSCULAR | Status: DC
  Administered 2014-07-06: 20 mL
  Administered 2014-07-06 – 2014-07-07 (×3): 10 mL
  Administered 2014-07-09: 20 mL
  Administered 2014-07-09 – 2014-07-11 (×4): 10 mL

## 2014-07-06 MED ORDER — FENTANYL CITRATE (PF) 100 MCG/2ML IJ SOLN
50.0000 ug | INTRAMUSCULAR | Status: AC | PRN
  Administered 2014-07-06 (×3): 50 ug via INTRAVENOUS
  Filled 2014-07-06 (×2): qty 2

## 2014-07-06 MED ORDER — AMIODARONE LOAD VIA INFUSION
150.0000 mg | Freq: Once | INTRAVENOUS | Status: DC
  Administered 2014-07-06: 150 mg via INTRAVENOUS
  Filled 2014-07-06: qty 83.34

## 2014-07-06 MED ORDER — CETYLPYRIDINIUM CHLORIDE 0.05 % MT LIQD
7.0000 mL | Freq: Four times a day (QID) | OROMUCOSAL | Status: DC
  Administered 2014-07-06 – 2014-07-08 (×9): 7 mL via OROMUCOSAL

## 2014-07-06 MED ORDER — FENTANYL BOLUS VIA INFUSION
25.0000 ug | INTRAVENOUS | Status: DC | PRN
  Filled 2014-07-06: qty 25

## 2014-07-06 MED ORDER — FENTANYL CITRATE (PF) 100 MCG/2ML IJ SOLN: 50.0000 ug | Freq: Once | INTRAMUSCULAR | Status: DC

## 2014-07-06 MED ORDER — CHLORHEXIDINE GLUCONATE 0.12 % MT SOLN
15.0000 mL | Freq: Two times a day (BID) | OROMUCOSAL | Status: DC
  Administered 2014-07-06 – 2014-07-08 (×5): 15 mL via OROMUCOSAL
  Filled 2014-07-06 (×4): qty 15

## 2014-07-06 MED ORDER — SODIUM CHLORIDE 0.9 % IV SOLN
25.0000 ug/h | INTRAVENOUS | Status: DC
  Administered 2014-07-06: 50 ug/h via INTRAVENOUS
  Filled 2014-07-06: qty 50

## 2014-07-06 MED ORDER — CETYLPYRIDINIUM CHLORIDE 0.05 % MT LIQD
7.0000 mL | Freq: Two times a day (BID) | OROMUCOSAL | Status: DC
  Administered 2014-07-06: 7 mL via OROMUCOSAL

## 2014-07-06 MED ORDER — AMIODARONE IV BOLUS ONLY 150 MG/100ML
150.0000 mg | Freq: Once | INTRAVENOUS | Status: AC
  Administered 2014-07-06: 150 mg via INTRAVENOUS
  Filled 2014-07-06: qty 100

## 2014-07-06 MED FILL — Medication: Qty: 1 | Status: AC

## 2014-07-06 NOTE — Progress Notes (Addendum)
I have contacted Dr. Lucianne Lei Trigt's scheduler, Thurmond Butts, and await return call from MD to discuss overall plan of care and timing of pt's pending surgery. [employer] had approved pt admission to inpt rehab 06/29/14 for rehab prior to his pending cardiac surgery. Dental extractions yesterday planned by Dr. Prescott Gum and overall plan was for pt to return to inpt rehab postoperatively to complete his rehab with discharge planned for 6/15. Now that pt has been readmitted to acute hospital, [employer] is requiring definitive plan for surgery to be outlined. I have discussed with Dr. Thereasa Solo, Dr. Letta Pate, and Dr. Naaman Plummer. I await call from Dr. Prescott Gum to then pursue options with [facility]. [phone] Noted Surgery plans. I have contacted [facility] Medicare and Dr. Reynaldo Minium to facilitate readmission of pt to inpt rehab with pending surgery 6/17.

## 2014-07-06 NOTE — Progress Notes (Signed)
ANTICOAGULATION CONSULT NOTE  Pharmacy Consult for Heparin Indication: DVT  No Known Allergies  Patient Measurements: Height: 5\' 8"  (172.7 cm) Weight: 228 lb 2.8 oz (103.5 kg) IBW/kg (Calculated) : 68.4 Heparin Dosing Weight: 92 kg  Vital Signs: Temp: 98.9 F (37.2 C) (06/10 1949) Temp Source: Oral (06/10 1949) BP: 134/87 mmHg (06/10 2100) Pulse Rate: 69 (06/10 2008)  Labs:  Recent Labs  07/04/14 0503 07/15/2014 6659 07/06/2014 1115 07/13/2014 1838 07/06/14 0519 07/06/14 1440 07/06/14 2003 07/06/14 2130  HGB 8.1* 7.7* 7.7*  --  8.9* 8.9*  --   --   HCT 25.8* 24.7* 24.4*  --  28.3* 28.5*  --   --   PLT 411* 376  --   --  303 256  --   --   HEPARINUNFRC 0.39  --   --   --   --   --   --  0.21*  CREATININE  --   --   --  1.28* 1.22 1.46*  --   --   TROPONINI  --   --   --   --   --  0.06* 0.07*  --     Estimated Creatinine Clearance: 57.2 mL/min (by C-G formula based on Cr of 1.46).   Assessment: 69 yo male admitted with SOB, AV insufficiency.  Scheduled for AVR 5/26 but developed recurrent melena, s/p EGD with clipping of gastric erosion 5/26.  Course complicated by DVT.  On 6/7 taken for multiple tooth extractions, and anticoagulation was held.  Now s/p VT arrest. Spoke to Dr Halford Chessman, he and cardiologist feel that anticoagulation is necessary and the benefit outweighs the risk of bleeding.  Previously therapeutic on heparin at 1450 units/hr.    PM HL = 0.21  Goal of Therapy:  Heparin level 0.3-0.5 units/ml - will target lower end of goal range due to risk of bleeding. Monitor platelets by anticoagulation protocol: Yes   Plan:  Increase heparin to 1650 units / hr Follow up AM labs  Thank you Anette Guarneri, PharmD [phone]  07/06/2014 9:47 PM

## 2014-07-06 NOTE — Discharge Summary (Signed)
[name], [name] NO.:  [id number]  MEDICAL RECORD NO.:  [id number]  LOCATION:  [facility]                         FACILITY:  [place]  PHYSICIAN:  Charlett Blake, M.D.DATE OF BIRTH:  1945-07-17  DATE OF ADMISSION:  06/29/2014 DATE OF DISCHARGE:  07/22/2014                              DISCHARGE SUMMARY   DISCHARGE DIAGNOSES: 1. Functional deficit secondary to debilitation related to aortic     valve insufficiency. 2. Acute deep venous thrombosis, left brachial and axillary veins. 3. Chronic atrial fibrillation. 4. Acute blood loss anemia. 5. Poor dentition.  HISTORY OF PRESENT ILLNESS:  A 69 year old right-handed male with history of chronic atrial fibrillation, aortic valve repair in the setting of aortic aneurysm in 1990s, had recently been on Coumadin, stopped due to a GI bleed, treated at [facility].  The patient lives with his medically-disabled wife.  Presented on Jun 14, 2014, with shortness of breath, cardiogenic shock, found to have severe aortic valve insufficiency, transferred to [facility], placed on intravenous heparin.  TEE completed showing ejection fraction of 60%, only one of the leaflets could be visualized as moving and was significantly reduced.  Noted severe aortic insufficiency.  Appeared to be aortic dissection flap in the descending thoracic aorta.  Cardiac catheterization showed no significant CAD.  Cardiothoracic Surgery consulted.  Hospital course complicated by GI bleed.  Gastroenterology consulted.  EGD completed showing two gastric erosions without active bleeding.  He was transfused.  Noted loose tarry stools.  EGD repeated showing large gastric blood clot with erosion visible inferior to the GE junction.  Course complicated by DVT, left brachium axillary vein, placed on heparin therapy.  Plan was for redo of aortic root replacement once medically stable.  The patient was admitted for comprehensive  rehab program.  PAST MEDICAL HISTORY:  See discharge diagnoses.  SOCIAL HISTORY:  Lives with medically-disabled wife.  FUNCTIONAL STATUS:  Upon admission to [facility], was minimal assist to ambulate 44 feet with the rolling walker, minimal assist sit to stand, minimal assist activities of daily living.  PHYSICAL EXAMINATION:  VITAL SIGNS:  Blood pressure 128/87, pulse 87, temperature 98, respirations 17. GENERAL:  This was an alert male, oriented x3. LUNGS:  Clear to auscultation. CARDIAC:  Rate controlled. ABDOMEN:  Soft, nontender.  Good bowel sounds. EXTREMITIES:  Moving all extremities.  REHABILITATION HOSPITAL COURSE:  The patient was admitted to Inpatient Rehab Services with therapies initiated on a 3-hour daily basis consisting of physical therapy, occupational therapy, and rehabilitation nursing.  The following issues were addressed during the patient's rehabilitation stay.  Pertaining to [name], debilitation related to aortic valve insufficiency.  The patient was receiving inpatient rehab therapies, awaiting cardiothoracic surgery, plan for redo of aortic valve.  He remained on heparin therapy for acute DVT, awaiting plan for long-term anticoagulation.  No further bleeding episodes, close monitoring of hemoglobin and hematocrit.  Cardiac rate remained controlled.  Blood pressure monitored.  The patient was attending full therapies, ambulating 90 feet, minimal assist with rolling walker, propelling his wheelchair with supervision.  He can ambulate up and down eight 3-inch steps, bilateral hand rails with minimal assist.  Strength and endurance continued to  improve.  He could gather his belongings for activities of daily living and homemaking.  Consultation obtained with Dental Services for multiple dental caries, felt needed multiple extractions due to aortic valve with upcoming plan for aortic valve replacement and repair.  The patient underwent procedure for  multiple extractions on July 05, 2014, tolerated well.  He was discharged to Acute Care Services, awaiting plan for aortic valve repair.  All medication changes made as per attendings.     [name], P.A.   ______________________________ Charlett Blake, [name]    DA/MEDQ  D:  07/04/2014  T:  07/06/2014  Job:  [id number]  cc:   Ivin Poot, [name]

## 2014-07-06 NOTE — Progress Notes (Signed)
eLink Physician-Brief Progress Note Patient Name: [name] DOB: [birth year] MRN: [id number]   Date of Service  07/06/2014  HPI/Events of Note  Best Practice  eICU Interventions  CBG monitoring q4 hours      Intervention Category Minor Interventions: Routine modifications to care plan (e.g. PRN medications for pain, fever)  DETERDING,ELIZABETH 07/06/2014, 11:15 PM

## 2014-07-06 NOTE — Consult Note (Signed)
Patient ID: [name] MRN: [id number], DOB/AGE: [birth year]   Admit date: 07/24/2014   Primary Physician: [name], MD Primary Cardiologist: Dr. Meda Coffee  Pt. 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 Suffered VT arrest earlier today after multiple dental extractions.   Problem List  Past Medical History  Diagnosis Date  . Chest pain   . Hypertension   . Aortic aneurysm 1993  . 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 Pneumonia   . Aortic valve replaced      05/2014 transfered from [place] to [facility] for HF in the setting of failed valve repair. 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 CT surgery considering AVR, delayed due to GI bleed  . Hx of aortic aneurysm repair 06/14/2014  . Atrial fibrillation 05/2014  . Gastritis and gastroduodenitis with hemorrhage 05/2014    severe.   . Tubular adenoma of colon 12/2012  . DVT of upper extremity (deep vein thrombosis) 05/2014    left  . Acute blood loss anemia 05/2014   . CAD (coronary artery disease)     minimal CAD on cath 05/2014    Past Surgical History  Procedure Laterality Date  . Abdominal aortic aneurysm repair  1993    At [facility], Unspecified  . Splenectomy    . Cardiac valve replacement      At [facility]  . Esophagogastroduodenoscopy  Aug 2014    [facility]: large duodenal ulcer with visible vessel, s/p clip and epi, erosive gastritis. +h.pylori serology, treated with amoxicillin and biaxin.   . Colonoscopy N/A 12/27/2012    Procedure: COLONOSCOPY;  Surgeon: Danie Binder, MD;  Location: [facility] ENDO SUITE;  Service: Endoscopy;  Laterality: N/A;  8:45-moved to 855   . Tee without cardioversion N/A 06/15/2014    Procedure: TRANSESOPHAGEAL ECHOCARDIOGRAM (TEE);  Surgeon: Sueanne Margarita, MD;  Location: [facility] ENDOSCOPY;  Service: Cardiovascular;   Laterality: N/A;  . Cardiac catheterization N/A 06/15/2014    Procedure: Fluoroscopy Guidance;  Surgeon: Josue Hector, MD;  Location: [place] CV LAB;  Service: Cardiovascular;  Laterality: N/A;  . Esophagogastroduodenoscopy N/A 06/18/2014    Procedure: ESOPHAGOGASTRODUODENOSCOPY (EGD);  Surgeon: Jerene Bears, MD;  Location: [facility] ENDOSCOPY;  Service: Endoscopy;  Laterality: N/A;  . Peripheral vascular catheterization  06/19/2014    Procedure: Aortic Arch Angiography;  Surgeon: Jettie Booze, MD;  Location: [place] CV LAB;  Service: Cardiovascular;;  . Esophagogastroduodenoscopy N/A 06/21/2014    Procedure: ESOPHAGOGASTRODUODENOSCOPY (EGD);  Surgeon: Jerene Bears, MD;  Location: [facility] ENDOSCOPY;  Service: Endoscopy;  Laterality: N/A;  . Esophagogastroduodenoscopy N/A 06/21/2014    Procedure: ESOPHAGOGASTRODUODENOSCOPY (EGD);  Surgeon: Jerene Bears, MD;  Location: [place];  Service: Gastroenterology;  Laterality: N/A;  . Multiple extractions with alveoloplasty N/A 07/09/2014    Procedure: Extraction of tooth #'s 3,4,5,13,14,21,23,24,25,26, with alveoloplasty and gross debridement of remaining teeth.;  Surgeon: [name], DDS;  Location: [place];  Service: Oral Surgery;  Laterality: N/A;     Allergies  No Known Allergies  HPI  69 y/o AAM with history of ascending aortic root dissection with aortic insuffiencey in 1993, s/p emergent aortic root replacement with a 25 mm St. Jude mechanical valve-conduit. The coronary reconstruction was with a 8 mm Gore-Tex graft Cabral type repair. He was followed in the past by Dr. Dannielle Burn. He was  seen recently by Dr. Meda Coffee when he initially presented from [facility] 06/14/14 after presenting with acute CHF in the setting of  severe AV stenosis + insuffiencey, confirmed by TEE. This was after he had self-discontinued warfarin for 2-3 months for unclear reasons. Fluoroscopy of the aortic valve showed 1 mechanical leaflet immobile and the other  leaflet moving slightly less than normal. CT surgery recommended redo AVR. Preoperative coronary angiogram on 06/19/14 revealed no significant coronary artery disease in the major epicardial vessels (only 10% stenosis of the mid LAD). It was somewhat difficult to see the distal RCA system. He was initially scheduled for redo AVR on 06/21/14, however developed an acute anemia secondary to a GIB. He underwent transfusion and EGD clipping of a gastric erosion. This was followed by development of a LUE DVT. He was placed on IV heparin and transferred to CIR until scheduled AVR, tentatively scheduled for 07/13/14. Given dental carries, he was ordered to undergo multiple dental extractions. This was performed earlier today. During recovery, he suffered VT arrest and required 2 minutes of CPR, Epi x 1 and intubation. Admitted by Critical Care and now stable in the ICU.    K normal at 4.6. Mg pending. Troponin pending. Post arrest EKG shows atrial fibrillation with a controlled ventricular response. Patient remains intubated.    Home Medications  Prior to Admission medications   Medication Sig Start Date End Date Taking? Authorizing Provider  ferrous sulfate 325 (65 FE) MG EC tablet Take 325 mg by mouth every other day.    Yes Historical Provider, MD  heparin 100-0.45 UNIT/ML-% infusion Inject 1,300 Units/hr into the vein continuous. 06/29/14   Almyra Deforest, PA  ondansetron (ZOFRAN) 4 MG/2ML SOLN injection Inject 2 mLs (4 mg total) into the vein every 6 (six) hours as needed for nausea or vomiting. 06/29/14   Almyra Deforest, PA  pantoprazole (PROTONIX) 40 MG injection Inject 40 mg into the vein every 12 (twelve) hours. 06/29/14   Almyra Deforest, PA    Family History  Family History  Problem Relation Age of Onset  . Hypertension Father 65  . Diabetes Mother 15  . Colon cancer Neg Hx     Social History  History   Social History  . Marital Status: Married    Spouse Name: N/A  . Number of Children: 3  . Years of  Education: N/A   Occupational History  . Retired    Social History Main Topics  . Smoking status: Former Research scientist (life sciences)  . Smokeless tobacco: Not on file  . Alcohol Use: Yes     Comment: 3-4 beers per day  . Drug Use: No  . Sexual Activity: Not on file   Other Topics Concern  . Not on file   Social History Narrative   Patient has two daughters and one son in good health     Review of Systems General:  No chills, fever, night sweats or weight changes.  Cardiovascular:  No chest pain, dyspnea on exertion, edema, orthopnea, palpitations, paroxysmal nocturnal dyspnea. Dermatological: No rash, lesions/masses Respiratory: No cough, dyspnea Urologic: No hematuria, dysuria Abdominal:   No nausea, vomiting, diarrhea, bright red blood per rectum, melena, or hematemesis Neurologic:  No visual changes, wkns, changes in mental status. All other systems reviewed and are otherwise negative except as noted above.  Physical Exam  Blood pressure 132/78, pulse 90, temperature 99.1 F (37.3 C), temperature source Oral, resp. rate 18, height 5\' 8"  (1.727 m), weight 228 lb 2.8 oz (103.5 kg),  SpO2 100 %.  General: Pleasant, NAD, intubated Psych: Normal affect. Neuro: Alert. Intubated.  HEENT: Normal  Neck: Supple without bruits or JVD. Lungs:  Resp regular and unlabored, CTA. Heart: irregularly irregular rhythm, regular rate, audible mechanical valve sound, 2/3 AI murmur Abdomen: Soft, non-tender, non-distended, BS + x 4.  Extremities: No clubbing, cyanosis or edema. DP/PT/Radials 2+ and equal bilaterally.  Labs  Troponin (Point of Care Test) No results for input(s): TROPIPOC in the last 72 hours.  Recent Labs  07/06/14 1440  TROPONINI 0.06*   Lab Results  Component Value Date   WBC 9.7 07/06/2014   HGB 8.9* 07/06/2014   HCT 28.5* 07/06/2014   MCV 89.3 07/06/2014   PLT 256 07/06/2014     Recent Labs Lab 07/06/14 1440  NA 133*  K 4.7  CL 103  CO2 21*  BUN 12  CREATININE 1.46*    CALCIUM 8.8*  PROT 6.7  BILITOT 1.0  ALKPHOS 47  ALT 14*  AST 27  GLUCOSE 128*   Lab Results  Component Value Date   TRIG 61 06/21/2014   No results found for: DDIMER   Radiology/Studies  Dg Orthopantogram  06/19/2014   CLINICAL DATA:  Aortic stenosis, preop  EXAM: ORTHOPANTOGRAM/PANORAMIC  COMPARISON:  None available  FINDINGS: Multiple missing teeth and restorations. Caries involving teeth 3 and 4. Periapical lucency/erosion involving tooth number 13.  IMPRESSION: 1. Dental caries and periapical disease as above.   Electronically Signed   By: Lucrezia Europe [name]   On: 06/19/2014 16:52   Dg Knee 1-2 Views Left  07/03/2014   CLINICAL DATA:  69 year old male with generalized left knee pain and swelling progressive the past few days.  EXAM: LEFT KNEE - 1-2 VIEW  COMPARISON:  None.  FINDINGS: Suspect small suprapatellar joint effusion. No evidence acute fracture or malalignment. There is narrowing of the lateral compartmental joint space with subchondral sclerosis and subtle irregularity of the cortical surface of the lateral femoral condyle and lateral tibial plateau. Mild osteophyte formation in the patellofemoral compartment. No evidence of acute fracture or malalignment. No focal soft tissue abnormality.  IMPRESSION: 1. Small suprapatellar knee joint effusion which may be degenerative, or infectious. 2. Degenerative changes relatively focal to the lateral compartments with significant narrowing of the joint space and subtle irregularity of the cortical margin of the lateral femoral condyle and lateral tibial plateau. This is favored to represent advanced degenerative change with chondromalacia and secondary bony changes.   Electronically Signed   By: Jacqulynn Cadet [name]   On: 07/03/2014 18:43   Ct Head Wo Contrast  06/19/2014   CLINICAL DATA:  Patient with cognitive deficits and expressive aphasia.  EXAM: CT HEAD WITHOUT CONTRAST  TECHNIQUE: Contiguous axial images were obtained from the base  of the skull through the vertex without intravenous contrast.  COMPARISON:  None.  FINDINGS: Ventricles and sulci are appropriate for patient's age. No evidence for acute cortically based infarct, intracranial hemorrhage, mass lesion or mass-effect. Orbits are unremarkable. Polypoid mucosal thickening bilateral maxillary sinuses. Frontal sinus and ethmoid air cells are unremarkable. Mastoid air cells are well aerated. Calvarium is intact. Zygomatic arches are unremarkable.  IMPRESSION: No acute intracranial process.  Polypoid mucosal thickening paranasal sinuses.   Electronically Signed   By: Lovey Newcomer [name]   On: 06/19/2014 15:30   Dg Chest Port 1 View  06/24/2014   CLINICAL DATA:  Shortness of breath.  Subsequent exam.  EXAM: PORTABLE CHEST - 1 VIEW  COMPARISON:  06/21/2014  FINDINGS: Since the previous exam, the endotracheal tube has been removed. Right internal jugular central venous line is stable and well positioned.  Stable cardiomegaly. Minor basilar lung opacity consistent with atelectasis. No convincing pneumonia and no pulmonary edema. No pneumothorax.  Stable elevation the right hemidiaphragm.  IMPRESSION: 1. No acute cardiopulmonary disease. 2. Stable cardiomegaly. Minor lung base atelectasis. Right IJ central venous line is stable in well positioned.   Electronically Signed   By: Lajean Manes [name]   On: 06/24/2014 08:49   Dg Chest Portable 1 View  06/21/2014   CLINICAL DATA:  Status post EGD for upper GI bleed, evaluate ETT  EXAM: PORTABLE CHEST - 1 VIEW  COMPARISON:  06/16/2014  FINDINGS: Endotracheal tube terminates 4 cm above the carina.  Mild blunting of the left costophrenic angle. Mild left basilar atelectasis. Lungs otherwise clear. No pleural effusion or pneumothorax.  Cardiomegaly.  Right IJ venous catheter terminates at the cavoatrial junction.  IMPRESSION: Endotracheal tube terminates 4 cm above the carina.  Right IJ venous catheter terminates at the cavoatrial junction.  No  pneumothorax.   Electronically Signed   By: Julian Hy [name]   On: 06/21/2014 16:25   Dg Chest Port 1 View  06/16/2014   CLINICAL DATA:  Central line placement.  EXAM: PORTABLE CHEST - 1 VIEW  COMPARISON:  06/14/2014  FINDINGS: New right jugular central line is been placed with the tip in the upper right atrium. No pneumothorax. Stable volume loss of the right lung with slightly more prominent mid lung atelectasis. Mild interstitial edema suspected. No pleural effusions. Stable cardiac enlargement.  IMPRESSION: Central line tip in upper right atrium. No pneumothorax. Slightly more prominent right mid lung atelectasis. Mild interstitial edema suspected.   Electronically Signed   By: Aletta Edouard [name]   On: 06/16/2014 13:58   Dg Chest Port 1 View  06/14/2014   CLINICAL DATA:  CHF exacerbation, history hypertension and prior AVR  EXAM: PORTABLE CHEST - 1 VIEW  COMPARISON:  Portable exam 2010 hours compared to 06/13/2014  FINDINGS: Enlargement of cardiac silhouette post median sternotomy.  Tortuous aorta.  Mediastinal contours and pulmonary vascularity normal.  Minimal bibasilar atelectasis.  No gross infiltrate, pleural effusion or pneumothorax.  IMPRESSION: Enlargement of cardiac silhouette with minimal bibasilar atelectasis.   Electronically Signed   By: Lavonia Dana [name]   On: 06/14/2014 20:50    ECG  VT 149 bpm Post arrest EKG: atrial fibrillation with CVR 89 bpm    ASSESSMENT AND PLAN  Principal Problem:   Ventricular tachycardia Active Problems:   Hx of aortic aneurysm repair   Atrial fibrillation, do not know length of time   Gastric erosion   Acute respiratory failure, unspecified whether with hypoxia or hypercapnia   Aortic insufficiency   Anemia   1. VT Arrest: s/p successful resuscitation with Epi and CPR. Now intubated and stable. Recent LHC as part of preoperative w/u for redo AVR showed no significant CAD with only 10% mid LAD stenosis. However, given history of  dissected root with a false lumen, this may have been ischemically mediated. Cardiac enzymes pending. Start IV heparin today. Initiate IV amiodarone. Monitor on telemetry. Check Mg and repleat as needed. Keep K~ 4.0 and Mg ~2.0. Redo AVR  tentatively planned for 6/17. CT surgery to be notified. Will see if surgery date can be moved to an earlier date. Will also need EP recommendations regarding need for ICD.    Janee Morn, PA-C 07/06/2014, 3:43 PM

## 2014-07-06 NOTE — Progress Notes (Signed)
eLink Physician-Brief Progress Note Patient Name: [name] DOB: [birth year] MRN: [id number]   Date of Service  07/06/2014  HPI/Events of Note  Patient unable to void. Bladder scan with 550 mL residual.   eICU Interventions  Place Foley Catheter.      Intervention Category Minor Interventions: Routine modifications to care plan (e.g. PRN medications for pain, fever)  Keniel Ralston Cornelia Copa 07/06/2014, 7:22 PM

## 2014-07-06 NOTE — Progress Notes (Signed)
Peripherally Inserted Central Catheter/Midline Placement  The IV Nurse has discussed with the patient and/or persons authorized to consent for the patient, the purpose of this procedure and the potential benefits and risks involved with this procedure.  The benefits include less needle sticks, lab draws from the catheter and patient may be discharged home with the catheter.  Risks include, but not limited to, infection, bleeding, blood clot (thrombus formation), and puncture of an artery; nerve damage and irregular heat beat.  Alternatives to this procedure were also discussed.  PICC/Midline Placement Documentation        [name] 07/06/2014, 3:17 PM

## 2014-07-06 NOTE — Progress Notes (Signed)
ANTICOAGULATION CONSULT NOTE - Initial Consult  Pharmacy Consult for Heparin Indication: DVT  No Known Allergies  Patient Measurements: Height: 5\' 8"  (172.7 cm) Weight: 228 lb 2.8 oz (103.5 kg) IBW/kg (Calculated) : 68.4 Heparin Dosing Weight: 92 kg  Vital Signs: Temp: 99.1 F (37.3 C) (06/10 1328) Temp Source: Oral (06/10 1328) BP: 132/97 mmHg (06/10 1500) Pulse Rate: 87 (06/10 1500)  Labs:  Recent Labs  07/04/14 0503 06/30/2014 8768 07/13/2014 1115 07/13/2014 1838 07/06/14 0519 07/06/14 1440  HGB 8.1* 7.7* 7.7*  --  8.9* 8.9*  HCT 25.8* 24.7* 24.4*  --  28.3* 28.5*  PLT 411* 376  --   --  303 256  HEPARINUNFRC 0.39  --   --   --   --   --   CREATININE  --   --   --  1.28* 1.22  --     Estimated Creatinine Clearance: 68.5 mL/min (by C-G formula based on Cr of 1.22).   Medical History: Past Medical History  Diagnosis Date  . Chest pain   . Hypertension   . Aortic aneurysm 1993  . 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 Pneumonia   . Aortic valve replaced      05/2014 transfered from [place] to [facility] for HF in the setting of failed valve repair. 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 CT surgery considering AVR, delayed due to GI bleed  . Hx of aortic aneurysm repair 06/14/2014  . Atrial fibrillation 05/2014  . Gastritis and gastroduodenitis with hemorrhage 05/2014    severe.   . Tubular adenoma of colon 12/2012  . DVT of upper extremity (deep vein thrombosis) 05/2014    left  . Acute blood loss anemia 05/2014   . CAD (coronary artery disease)     minimal CAD on cath 05/2014    Assessment: 69 yo male admitted with SOB, AV insufficiency.  Scheduled for AVR 5/26 but developed recurrent melena, s/p EGD with clipping of gastric erosion 5/26.  Course complicated by DVT.  On 6/7 taken for multiple tooth extractions, and anticoagulation was held.  Now s/p VT arrest. Spoke to Dr Halford Chessman, he and cardiologist feel that anticoagulation is  necessary and the benefit outweighs the risk of bleeding.  Previously therapeutic on heparin at 1450 units/hr.    Goal of Therapy:  Heparin level 0.3-0.5 units/ml - will target lower end of goal range due to risk of bleeding. Monitor platelets by anticoagulation protocol: Yes   Plan:  1. Start IV heparin gtt at 1450 units/hr with no bolus. 2. Check heparin level 6 hrs after gtt starts. 3. Daily heparin level and CBC. 4. Monitor closely for oral and GI bleeding.  Uvaldo Rising, BCPS  Clinical Pharmacist Pager [phone]  07/06/2014 3:20 PM

## 2014-07-06 NOTE — Progress Notes (Signed)
  Subjective: Comfortable after dental extractions yesterday- removal of infected teeth prior to redo AVR/root replacement. 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  He was making progress with PT in CIR and will need to regain conditioning prior to high risk redo heart surgery- I would expect he will need  At least7-10 days before he is ready- will follow and appreciatite [facility]  Medical care Objective: Vital signs in last 24 hours: Temp:  [97.7 F (36.5 C)-100.1 F (37.8 C)] 98 F (36.7 C) (06/10 0809) Pulse Rate:  [67-165] 105 (06/10 0809) Cardiac Rhythm:  [-] Atrial fibrillation (06/10 0800) Resp:  [18-30] 28 (06/10 0809) BP: (84-166)/(39-104) 161/104 mmHg (06/10 0800) SpO2:  [94 %-100 %] 99 % (06/10 0809) Arterial Line BP: (73-93)/(47-58) 90/58 mmHg (06/09 1109) FiO2 (%):  [3 %] 3 % (06/09 1927) Weight:  [240 lb 1.3 oz (108.9 kg)] 240 lb 1.3 oz (108.9 kg) (06/09 2100)  Hemodynamic parameters for last 24 hours:  abebrile  Intake/Output from previous day: 06/09 0701 - 06/10 0700 In: 1000 [I.V.:1000] Out: 600 [Urine:500; Blood:100] Intake/Output this shift:    Wet cough No edema normal BP Right IJ out of date and needs replacement with PICC in R arm  (ordered)  Lab Results:  Recent Labs  07/12/2014 0633 07/14/2014 1115 07/06/14 0519  WBC 4.8  --  7.8  HGB 7.7* 7.7* 8.9*  HCT 24.7* 24.4* 28.3*  PLT 376  --  303   BMET:  Recent Labs  07/11/2014 1838 07/06/14 0519  NA 133* 134*  K 4.5 4.6  CL 103 102  CO2 20* 23  GLUCOSE 119* 119*  BUN 12 12  CREATININE 1.28* 1.22  CALCIUM 8.8* 8.7*    PT/INR: No results for input(s): LABPROT, INR in the last 72 hours. ABG    Component Value Date/Time   PHART 7.440 06/19/2014 0808   HCO3 22.3 06/19/2014 0823   TCO2 23 06/19/2014 0823   ACIDBASEDEF 2.0 06/19/2014 0823   O2SAT 59.3 06/20/2014 0500   CBG (last 3)  No results for input(s): GLUCAP in the last 72 hours.  Assessment/Plan: S/P   Resume anticoagulation tomorrow as outluined Will follow as he prepares for redo Aortic root replacement   LOS: 1 day    [name] 07/06/2014

## 2014-07-06 NOTE — Progress Notes (Signed)
Noted medical issues. I will follow up on Monday. [phone]

## 2014-07-06 NOTE — Progress Notes (Signed)
  [place] TEAM 1 - Stepdown/ICU TEAM  Complete history was reviewed and the patient was interviewed/examined in his room at approximately 12:20 PM today.  At that time he was alert and interactive and complaining that he had not yet been given something to eat.  He denied chest pain shortness of breath fevers chills nausea or vomiting.  He did complain of modest pain at the site of his recent dental surgery.  His family was at the bedside and was updated on his status.  They agreed that he appeared to be reasonably comfortable and otherwise stable.  Physical exam was unrevealing at the time of my visit.  Recent labs and x-rays were reviewed.  Approximately 34 minutes later a page was sent to my pager that I did not immediately see informing me that the patient was being coded.  Upon interviewing the nurse who is attending to his care I was informed that the unit received notification from the telemetry monitor that it appeared the patient was in ventricular tachycardia.  The nurse entered the patient's room to check on him and found him initially alert.  She then glanced at the monitor and noted a heart rate of 150 at which time she turned her attention back to the patient and found that he had become unconscious and was slumped in bed.  She reported that he did not have a pulse.  She immediately began CPR and initiated a code.  Anesthesia arrived and the patient was intubated.  The initial rhythm was felt to be pulseless ventricular tachycardia.  The patient was given one dose of epinephrine and then recovered.  He reportedly did not require defibrillation or further medical therapies.  He was then transferred to the medical ICU at which time I was paged to alert me of his transfer (1:20 PM). It was this second page that caused me to find the initial missed page.    I presented immediately to the intensive care unit and found the patient alert and obviously uncomfortable on the ventilator.  Blood  pressure and heart rate were stable and oxygenation was 100%.  His respiratory rate was surprisingly controlled at approximately 24.  His physical exam was unchanged from my earlier visit with exception to the obvious presence of an ET tube.  His cardiac murmur had not changed nor had his pulmonary exam.  I then spoke with the patient's family in the waiting room and alerted them to the sequence of events and that he was presently stable.  PCCM has been updated on the sequence of events and is now assuming care of the patient.  Cardiology is to be consulted by Critical Care.  Cherene Altes, MD [facility] For Consults/Admissions - Flow Manager [phone] Office  [phone]  Contact MD directly via text page:      [email or web address]      password [facility]

## 2014-07-06 NOTE — Code Documentation (Signed)
  Patient Name: [name]   MRN: [id number]   Date of Birth/ Sex: 11/24/1945 , male      Admission Date: 07/04/2014  Attending Provider: Cherene Altes, MD  Primary Diagnosis: <principal problem not specified>   Indication: Pt was in his usual state of health until this PM, when he was noted to be in ventricular tachycardia on telemetry. Code blue was subsequently called. At the time of arrival on scene, ACLS protocol was underway.   Technical Description:  - CPR performance duration:  2  minutes  - Was defibrillation or cardioversion used? No   - Was external pacer placed? No  - Was patient intubated pre/post CPR? Yes   Medications Administered: Y = Yes; Blank = No Amiodarone  x1  Atropine    Calcium    Epinephrine  x1  Lidocaine    Magnesium    Norepinephrine    Phenylephrine    Sodium bicarbonate    Vasopressin     Post CPR evaluation:  - Final Status - Was patient successfully resuscitated ? Yes - What is current rhythm? Ventricular tachycardia - What is current hemodynamic status? stable  Miscellaneous Information:  - Labs sent, including: none  - Primary team notified?  Yes  - Family Notified? Yes  - Additional notes/ transfer status: To MICU     Kelby Aline, MD  07/06/2014, 1:44 PM

## 2014-07-06 NOTE — Consult Note (Signed)
PULMONARY / CRITICAL CARE MEDICINE   Name: [name] MRN: [id number] DOB: 17-Jan-1946    ADMISSION DATE:  07/02/2014 CONSULTATION DATE:  07/06/14  REFERRING MD :  Dr. Thereasa Solo   CHIEF COMPLAINT:  Cardiac Arrest   INITIAL PRESENTATION: 69 y/o M initially admitted to [facility] on 5/19 with SOB in the setting of AV insufficiency after developing a partially thrombosed mechanical AV after stopping coumadin for unclear reasons.  He was scheduled for an AVR on 5/26 but developed recurrent melena.  He underwent an EGD with clipping of gastric erosion on 5/26.  Course was complicated by DVT of the LUE, anemia, dental caries and deconditioning of critical illness.  He was transferred to [place] on 6/3 for planned rehab prior to AVR.  6/7 the patient was taken for multiple tooth extraction.  6/10 afternoon, he suffered a VT arrest requiring ~ 2 min of CPR.  He was intubated post CPR and PCCM consulted for evaluation.    STUDIES:    SIGNIFICANT EVENTS: 5/19-6/03  Admit to [facility] for cardiogenic shock secondary to AV insufficiency  5/20  Cardiac Cath >> non-obstructive disease, 10% stenosis in LAD & L circumflex  6/03-6/09  Admit to [facility] CIR for rehab.  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 [name] who initially presented to [facility] on 06/13/2014 complaining of SOB. While at [facility], he was found to have severe aortic insufficiency and transferred to [facility] for further evaluation.    Work up was consistent with a partially thrombosed mechanical valve after coumadin was stopped for unclear reasons (coumadin was stopped 3 months prior to 5/19 admit).  He was treated with intravenous  heparin. TEE completed showing ejection fraction of 60%. Only one of the leaflets could be visualized as moving movement was significantly reduced. There was severe aortic insufficiency. 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 Cardiac catheterization showed no significant CAD. Cardiothoracic surgery consulted for prosthetic valve dysfunction and need for surgical intervention. He was planned for AVR revision after rehab.  Hospital course complicated by GI bleed/melena with gastroenterology consulted 06/17/2014. EGD completed 06/18/2014 showing 2 gastric erosions without active bleeding. Patient required transfusion during hospital course. On 06/21/2014 with increased loose tarry stools EGD was repeated showing large gastric blood clot with erosion visible inferior to the GE junction. Hospital course was further complicated by an acute DVT in the left brachial and axillary veins (dx'd 06/24/2014).   He was transferred to [facility] on 6/3 for rehab efforts prior to redo aortic root replacement.  He was seen by Dr. Enrique Sack on 6/7 for dental carries and underwent extraction on 6/9 of multiple teeth (tooth numbers 3, 4, 5, 13, 14, 21, 23, 24, 25, and 26).  He was discharged from CIR back to [facility] post-operatively for close observation.  Anticoagulation was held for procedure and through 6/10.  Notes reflected planned AVR for 7-10 days post extraction.  On 6/10 early afternoon, he was noted to have a wide complex / VT arrest requiring approximately 2 minutes  of CPR.      PAST MEDICAL HISTORY :   has a past medical history of Chest pain; Hypertension; Aortic aneurysm (1993); History of splenectomy (1994); Pneumonia; Aortic valve replaced ( ); aortic aneurysm repair (06/14/2014); Atrial fibrillation (05/2014); Gastritis and gastroduodenitis with hemorrhage (05/2014); Tubular adenoma of colon (12/2012); DVT of upper extremity (deep  vein thrombosis) (05/2014); Acute blood loss anemia (05/2014 ); and CAD (coronary artery disease).  has past surgical history that includes Abdominal aortic aneurysm repair (1993); Splenectomy; Cardiac valve replacement; Esophagogastroduodenoscopy (Aug 2014); Colonoscopy (N/A, 12/27/2012); TEE without cardioversion (N/A, 06/15/2014); Cardiac catheterization (N/A, 06/15/2014); Esophagogastroduodenoscopy (N/A, 06/18/2014); Cardiac catheterization (06/19/2014); Esophagogastroduodenoscopy (N/A, 06/21/2014); Esophagogastroduodenoscopy (N/A, 06/21/2014); and Multiple extractions with alveoloplasty (N/A, 07/12/2014).   Prior to Admission medications   Medication Sig Start Date End Date Taking? Authorizing Provider  ferrous sulfate 325 (65 FE) MG EC tablet Take 325 mg by mouth every other day.    Yes Historical Provider, MD  heparin 100-0.45 UNIT/ML-% infusion Inject 1,300 Units/hr into the vein continuous. 06/29/14   Almyra Deforest, PA  ondansetron (ZOFRAN) 4 MG/2ML SOLN injection Inject 2 mLs (4 mg total) into the vein every 6 (six) hours as needed for nausea or vomiting. 06/29/14   Almyra Deforest, PA  pantoprazole (PROTONIX) 40 MG injection Inject 40 mg into the vein every 12 (twelve) hours. 06/29/14   Almyra Deforest, PA   No Known Allergies  FAMILY HISTORY:  indicated that his mother is deceased.    SOCIAL HISTORY:  reports that he has quit smoking. He does not have any smokeless tobacco history on file. He reports that he drinks alcohol. He reports that he does not use illicit drugs.  REVIEW OF SYSTEMS:  Unable to complete as patient is on MV.    SUBJECTIVE:   VITAL SIGNS: Temp:  [98 F (36.7 C)-100.1 F (37.8 C)] 99.1 F (37.3 C) (06/10 1328) Pulse Rate:  [67-165] 89 (06/10 1320) Resp:  [20-30] 21 (06/10 1320) BP: (133-166)/(60-104) 150/87 mmHg (06/10 1320) SpO2:  [94 %-100 %] 98 % (06/10 1133) FiO2 (%):  [3 %-60 %] 60 % (06/10 1320) Weight:  [228 lb 2.8 oz (103.5 kg)-240 lb 1.3 oz (108.9 kg)] 228 lb 2.8 oz (103.5 kg)  (06/10 1336)   HEMODYNAMICS:     VENTILATOR SETTINGS: Vent Mode:  [-] PRVC FiO2 (%):  [3 %-60 %] 60 % Set Rate:  [14 bmp] 14 bmp Vt Set:  [550 mL] 550 mL PEEP:  [5 cmH20] 5 cmH20 Plateau Pressure:  [20 cmH20] 20 cmH20   INTAKE / OUTPUT:  Intake/Output Summary (Last 24 hours) at 07/06/14 1403 Last data filed at 07/06/14 0100  Gross per 24 hour  Intake      0 ml  Output    500 ml  Net   -500 ml    PHYSICAL EXAMINATION: General:  Chronically ill adult male in NAD on vent  Neuro:  Arouses to voice, awake/ answers appropriately  HEENT:  MM pink/moist, no jvd Cardiovascular:  s1s2 irr irr, rate in 80's, mechanical valve click, 4-2/5 murmur heard best 2nd ICS RSB Lungs:  resp's even/non-labored, lungs bilaterally coarse Abdomen:  Obese/soft, NTND, hernia / soft Musculoskeletal:  No acute deformities  Skin:  Warm/dry, BUE 1-2+ edema, trace lower  LABS:  CBC  Recent Labs Lab 07/04/14 0503 07/25/2014 0633 07/17/2014 1115 07/06/14 0519  WBC 7.8 4.8  --  7.8  HGB 8.1* 7.7* 7.7* 8.9*  HCT 25.8* 24.7* 24.4* 28.3*  PLT 411* 376  --  303   Coag's No results for input(s): APTT, INR in the last 168 hours.   BMET  Recent Labs Lab 07/02/14 0425 06/29/2014 1838 07/06/14 0519  NA 135 133* 134*  K 4.0 4.5 4.6  CL 104 103 102  CO2 23 20* 23  BUN 8 12 12   CREATININE 1.12 1.28* 1.22  GLUCOSE 100* 119* 119*   Electrolytes  Recent Labs Lab 07/02/14 0425 07/06/2014 1838 07/06/14 0519  CALCIUM 8.9 8.8* 8.7*   Sepsis Markers No results for input(s): LATICACIDVEN, PROCALCITON, O2SATVEN in the last 168 hours.   ABG No results for input(s): PHART, PCO2ART, PO2ART in the last 168 hours.   Liver Enzymes  Recent Labs Lab 07/02/14 0425 07/06/14 0519  AST 21 21  ALT 14* 12*  ALKPHOS 49 47  BILITOT 0.8 0.7  ALBUMIN 2.5* 2.6*   Cardiac Enzymes No results for input(s): TROPONINI, PROBNP in the last 168 hours.   Glucose  Recent Labs Lab 07/06/14 1325  GLUCAP 141*     Imaging No results found.   ASSESSMENT / PLAN:  PULMONARY OETT A: Acute Respiratory Failure - in setting of cardiac arrest P:   MV support, 8 cc/kg Wean PEEP / FiO2 for sats > 92% Trend CXR  ABG in one hour  VAP prevention measures   CARDIOVASCULAR CVL R IJ >> d/c'd 6/10 RUE PICC 6/10 >>  A:  VT Cardiac Arrest - suspected ischemic / low output state as cause of cardiac arrest  AV Insufficiency s/p AVR with need for revision Atrial Fibrillation  Hx of HTN, AVR P:  Tele monitoring  Assess troponin, EKG  Begin heparin gtt with concern for thrombosed valve (discussed with Dr. Johnsie Cancel) Amiodarone per Cardiology Cardiology consulted, appreciate input  RENAL A:   At Risk AKI - s/p cardiac arrest P:   Trend BMP / UOP  Replace electrolytes as indicated   GASTROINTESTINAL A:   Recent GIB / Gastric Erosion s/p Clipping  Protein Calorie Malnutrition Hyponatremia   P:   PPI  NPO OGT, consider TF in am 6/11 if remains intubated  PRN zofran  Monitor for bleeding with anticoagulation and recent bleed   HEMATOLOGIC A:   Anemia - s/p transfusion 6/9 DVT LUE  P:  Trend CBC SCD's for DVT prophylaxis   INFECTIOUS A:  Dental Carries s/p Extraction - 6/9, 10 teeth removed P:   BCx2 6/10 >>   Trend fever curve / leukocytosis  ENDOCRINE A:   Mild Hyperglycemia   P:   Monitor glucose on BMP  NEUROLOGIC A:   Pain - post CPR / dental extraction.  Mental status appears to be at baseline after arrest  P:   RASS goal:  -1 PRN versed / Fentanyl Serial neuro exams  FAMILY  - Updates: Family updated at bedside.   - Inter-disciplinary family meet or Palliative Care meeting due by:  6/17  Noe Gens, NP-C [place] Pgr: [phone] or [phone]  07/06/2014, 2:03 PM   Reviewed above, examined.  69 yo male was admitted on 06/14/14 for dyspnea and assessment of aortic valve.  He had type 1 aortic dissection s/p repair in 1993.  He was  lost to follow up and not on anti-coagulation as outpt.  His hospital course was complicated by afib with RVR, PUD with bleeding >> seen by GI and surgery, and Lt arm DVT.  He was previously intubated for EGD.  He eventually stabilized and was transferred to rehab.  He returned to acute  care side for dental extraction on 6/09 in preparation of re-do AVR.  He was recovering in step down unit w/o specific complaints.  He was noted to have VT on tele monitor.  Medical staff came to room >> he was reported to be awake initially, but then lost consciousness and no pulse felt.  CPR started, he was intubated, and given epi x 1.  He returned to a fib and regained pulse.  He also regained his mental status.  He was transferred to ICU.    He currently denies chest pain, dyspnea, or abdominal pain.  His is alert, follows commands, moves extremities, ETT in place, heart rate irregular with metallic click, no wheeze, Lt arm edema, abd soft with reducible ventral hernia, no lower extremity edema.  Labs, ECG, rhythm strips from 6/10 reviewed.  Also reviewed previous cardiac cath, Echo, spirometry results.  Will continue full vent support until cardiac status more stable.  F/u labs, including cardiac enzymes.  Will start heparin gtt.  Continue BID protonix.  Amiodarone gtt to be started by cardiology, and they will d/c cardiac surgery about planning for AVR re-do.  Will check blood cultures also since he just had dental extraction in setting of periodontal disease.  D/w Dr. Johnsie Cancel.  Updated pt's family at bedside.  CC time by me independent of APP time is 45 minutes.  Chesley Mires, MD [facility] Pulmonary/Critical Care 07/06/2014, 3:04 PM Pager:  [phone] After 3pm call: [phone]

## 2014-07-06 NOTE — Progress Notes (Signed)
POST OPERATIVE NOTE: POST OP DAY #1  07/06/2014   [place] Found [id number]  VITALS: BP 138/82 mmHg  Pulse 88  Temp(Src) 98.6 F (37 C) (Oral)  Resp 24  Ht 5\' 8"  (1.727 m)  Wt 240 lb 1.3 oz (108.9 kg)  BMI 36.51 kg/m2  SpO2 95%  LABS:  Lab Results  Component Value Date   WBC 7.8 07/06/2014   HGB 8.9* 07/06/2014   HCT 28.3* 07/06/2014   MCV 88.7 07/06/2014   PLT 303 07/06/2014   BMET    Component Value Date/Time   NA 134* 07/06/2014 0519   K 4.6 07/06/2014 0519   CL 102 07/06/2014 0519   CO2 23 07/06/2014 0519   GLUCOSE 119* 07/06/2014 0519   BUN 12 07/06/2014 0519   CREATININE 1.22 07/06/2014 0519   CALCIUM 8.7* 07/06/2014 0519   GFRNONAA 60* 07/06/2014 0519   GFRAA >60 07/06/2014 0519    Lab Results  Component Value Date   INR 1.37 06/21/2014   INR 1.67* 06/15/2014   No results found for: PTT   [place] Found is status post multiple extractions with alveoloplasty and gross debridement of remaining dentition in the operating room with general anesthesia yesterday. Patient was admitted to stepdown unit for further evaluation postoperatively at the request of the anesthesia team. Patient is now followed by the hospitalist team.   SUBJECTIVE: Patient feels "fair". Patient denies having significant oral discomfort.   EXAM: There is no sign of oral infection, heme, or ooze. Sutures are intact. Clots are present. Generalized primary closure is noted. Intraoral and extraoral swelling and bruising are noted.   ASSESSMENT: Post operative course is consistent with dental procedures performed in the operating room.   PLAN: 1. Chlorhexidine gluconate rinses twice daily after breakfast and at bedtime.  2. Salt water rinses every 2 hours while awake in between the chlorhexidine gluconate rinses.  3. Consider use of Amicar 5% oral rinses rinsing with 10 mL's every hour as needed for persistent oozing bleeding starts after anticoagulation therapy is  restarted. 4. Sutures will dissolve on their own. We will follow for suture removal as indicated. 5. Surgery with Dr. Tharon Aquas Trigt as indicated per his discretion.  [name], DDS

## 2014-07-06 NOTE — Progress Notes (Signed)
Chaplain responded to code.   Pt's sister and sister's granddaughter witnessed.   Chaplain provided emotional support to pt's sister who is visibly overwhelmed.  Delford Field, Chaplain 07/06/2014

## 2014-07-07 ENCOUNTER — Inpatient Hospital Stay (HOSPITAL_COMMUNITY): Payer: Commercial Managed Care - HMO

## 2014-07-07 DIAGNOSIS — K253 Acute gastric ulcer without hemorrhage or perforation: Secondary | ICD-10-CM

## 2014-07-07 LAB — CBC
HCT: 28.3 % — ABNORMAL LOW (ref 39.0–52.0)
Hemoglobin: 8.8 g/dL — ABNORMAL LOW (ref 13.0–17.0)
MCH: 27.8 pg (ref 26.0–34.0)
MCHC: 31.1 g/dL (ref 30.0–36.0)
MCV: 89.3 fL (ref 78.0–100.0)
Platelets: 286 10*3/uL (ref 150–400)
RBC: 3.17 MIL/uL — AB (ref 4.22–5.81)
RDW: 17.6 % — ABNORMAL HIGH (ref 11.5–15.5)
WBC: 9.9 10*3/uL (ref 4.0–10.5)

## 2014-07-07 LAB — BLOOD GAS, ARTERIAL
Acid-base deficit: 4.8 mmol/L — ABNORMAL HIGH (ref 0.0–2.0)
Bicarbonate: 20 mEq/L (ref 20.0–24.0)
DRAWN BY: 280981
FIO2: 0.4 %
O2 Saturation: 99.5 %
PCO2 ART: 38.1 mmHg (ref 35.0–45.0)
PEEP: 5 cmH2O
PH ART: 7.34 — AB (ref 7.350–7.450)
Patient temperature: 98.6
Pressure support: 5 cmH2O
TCO2: 21.1 mmol/L (ref 0–100)
pO2, Arterial: 142 mmHg — ABNORMAL HIGH (ref 80.0–100.0)

## 2014-07-07 LAB — COMPREHENSIVE METABOLIC PANEL
ALT: 14 U/L — ABNORMAL LOW (ref 17–63)
AST: 24 U/L (ref 15–41)
Albumin: 2.6 g/dL — ABNORMAL LOW (ref 3.5–5.0)
Alkaline Phosphatase: 44 U/L (ref 38–126)
Anion gap: 8 (ref 5–15)
BUN: 18 mg/dL (ref 6–20)
CO2: 22 mmol/L (ref 22–32)
Calcium: 8.9 mg/dL (ref 8.9–10.3)
Chloride: 106 mmol/L (ref 101–111)
Creatinine, Ser: 2.22 mg/dL — ABNORMAL HIGH (ref 0.61–1.24)
GFR calc Af Amer: 34 mL/min — ABNORMAL LOW (ref >60)
GFR calc non Af Amer: 29 mL/min — ABNORMAL LOW (ref >60)
Glucose, Bld: 90 mg/dL (ref 65–99)
Potassium: 4.7 mmol/L (ref 3.5–5.1)
Sodium: 136 mmol/L (ref 135–145)
Total Bilirubin: 0.7 mg/dL (ref 0.3–1.2)
Total Protein: 6.1 g/dL — ABNORMAL LOW (ref 6.5–8.1)

## 2014-07-07 LAB — GLUCOSE, CAPILLARY
GLUCOSE-CAPILLARY: 66 mg/dL (ref 65–99)
GLUCOSE-CAPILLARY: 85 mg/dL (ref 65–99)
GLUCOSE-CAPILLARY: 65 mg/dL (ref 65–99)
GLUCOSE-CAPILLARY: 83 mg/dL (ref 65–99)
GLUCOSE-CAPILLARY: 114 mg/dL — AB (ref 65–99)
Glucose-Capillary: 130 mg/dL — ABNORMAL HIGH (ref 65–99)
Glucose-Capillary: 66 mg/dL (ref 65–99)
Glucose-Capillary: 68 mg/dL (ref 65–99)
Glucose-Capillary: 85 mg/dL (ref 65–99)
Glucose-Capillary: 96 mg/dL (ref 65–99)

## 2014-07-07 LAB — HEPARIN LEVEL (UNFRACTIONATED)
Heparin Unfractionated: 0.45 IU/mL (ref 0.30–0.70)
Heparin Unfractionated: 0.5 IU/mL (ref 0.30–0.70)

## 2014-07-07 LAB — TROPONIN I: TROPONIN I: 0.08 ng/mL — AB (ref <0.031)

## 2014-07-07 LAB — MAGNESIUM: Magnesium: 2 mg/dL (ref 1.7–2.4)

## 2014-07-07 MED ORDER — PRO-STAT SUGAR FREE PO LIQD
30.0000 mL | Freq: Every day | ORAL | Status: DC
  Administered 2014-07-07 – 2014-07-08 (×2): 30 mL
  Filled 2014-07-07 (×3): qty 30

## 2014-07-07 MED ORDER — PIPERACILLIN-TAZOBACTAM 3.375 G IVPB
3.3750 g | Freq: Three times a day (TID) | INTRAVENOUS | Status: DC
  Administered 2014-07-07 – 2014-07-08 (×3): 3.375 g via INTRAVENOUS
  Filled 2014-07-07 (×5): qty 50

## 2014-07-07 MED ORDER — DEXTROSE 50 % IV SOLN
25.0000 mL | Freq: Once | INTRAVENOUS | Status: AC
  Administered 2014-07-07: 25 mL via INTRAVENOUS

## 2014-07-07 MED ORDER — VITAL HIGH PROTEIN PO LIQD
1000.0000 mL | ORAL | Status: DC
  Administered 2014-07-07 – 2014-07-08 (×2): 1000 mL
  Filled 2014-07-07 (×4): qty 1000

## 2014-07-07 MED ORDER — DEXTROSE 50 % IV SOLN
1.0000 | Freq: Once | INTRAVENOUS | Status: AC
  Administered 2014-07-07: 50 mL via INTRAVENOUS

## 2014-07-07 MED ORDER — VANCOMYCIN HCL 10 G IV SOLR
1250.0000 mg | INTRAVENOUS | Status: DC
  Administered 2014-07-08: 1250 mg via INTRAVENOUS
  Filled 2014-07-07: qty 1250

## 2014-07-07 MED ORDER — DEXTROSE 50 % IV SOLN
INTRAVENOUS | Status: AC
  Administered 2014-07-07: 25 mL via INTRAVENOUS
  Filled 2014-07-07: qty 50

## 2014-07-07 MED ORDER — DEXTROSE 50 % IV SOLN
INTRAVENOUS | Status: AC
  Administered 2014-07-07: 50 mL via INTRAVENOUS
  Filled 2014-07-07: qty 50

## 2014-07-07 MED ORDER — VANCOMYCIN HCL 10 G IV SOLR
2000.0000 mg | Freq: Once | INTRAVENOUS | Status: AC
  Administered 2014-07-07: 2000 mg via INTRAVENOUS
  Filled 2014-07-07: qty 2000

## 2014-07-07 NOTE — Progress Notes (Signed)
ANTICOAGULATION CONSULT NOTE - Follow Up Consult  Pharmacy Consult for Heparin  Indication: DVT  No Known Allergies  Patient Measurements: Height: 5\' 8"  (172.7 cm) Weight: 231 lb 4.2 oz (104.9 kg) IBW/kg (Calculated) : 68.4  Vital Signs: Temp: 98.8 F (37.1 C) (06/11 0752) Temp Source: Oral (06/11 0752) BP: 143/87 mmHg (06/11 1200) Pulse Rate: 99 (06/11 1200)  Labs:  Recent Labs  07/06/14 0519 07/06/14 1440 07/06/14 2003 07/06/14 2130 07/07/14 0202 07/07/14 0400 07/07/14 1113  HGB 8.9* 8.9*  --   --   --  8.8*  --   HCT 28.3* 28.5*  --   --   --  28.3*  --   PLT 303 256  --   --   --  286  --   HEPARINUNFRC  --   --   --  0.21*  --  0.45 0.50  CREATININE 1.22 1.46*  --   --   --  2.22*  --   TROPONINI  --  0.06* 0.07*  --  0.08*  --   --     Estimated Creatinine Clearance: 37.9 mL/min (by C-G formula based on Cr of 2.22).   Assessment: 69 yo male admitted with SOB, AV insufficiency. Scheduled for AVR 5/26 but developed recurrent melena, s/p EGD with clipping of gastric erosion 5/26. Course complicated by DVT. On 6/7 taken for multiple tooth extractions, and anticoagulation was held. Now s/p VT arrest. Spoke to Dr Halford Chessman, he and cardiologist feel that anticoagulation is necessary and the benefit outweighs the risk of bleeding.  Heparin level currently therapeutic x 2 on 1650 units/hr.  No bleeding or complications noted.  Goal of Therapy:  Heparin level 0.3-0.5 units/ml Monitor platelets by anticoagulation protocol: Yes   Plan:  -Continue heparin at 1650 units/hr -Daily CBC/HL -Monitor for bleeding  Uvaldo Rising, BCPS  Clinical Pharmacist Pager [phone]  07/07/2014 12:16 PM

## 2014-07-07 NOTE — Progress Notes (Signed)
ANTIBIOTIC CONSULT NOTE - INITIAL  Pharmacy Consult for Vancomycin, Zosyn Indication: bacteremia  No Known Allergies  Patient Measurements: Height: 5\' 8"  (172.7 cm) Weight: 231 lb 4.2 oz (104.9 kg) IBW/kg (Calculated) : 68.4 Adjusted Body Weight: n/a  Vital Signs: Temp: 98.8 F (37.1 C) (06/11 0752) Temp Source: Oral (06/11 0752) BP: 142/83 mmHg (06/11 1000) Pulse Rate: 86 (06/11 1000) Intake/Output from previous day: 06/10 0701 - 06/11 0700 In: 981.3 [I.V.:981.3] Out: 455 [Urine:455] Intake/Output from this shift: Total I/O In: 96.2 [I.V.:96.2] Out: 75 [Urine:75]  Labs:  Recent Labs  07/06/14 0519 07/06/14 1440 07/07/14 0400  WBC 7.8 9.7 9.9  HGB 8.9* 8.9* 8.8*  PLT 303 256 286  CREATININE 1.22 1.46* 2.22*   Estimated Creatinine Clearance: 37.9 mL/min (by C-G formula based on Cr of 2.22). No results for input(s): VANCOTROUGH, VANCOPEAK, VANCORANDOM, GENTTROUGH, GENTPEAK, GENTRANDOM, TOBRATROUGH, TOBRAPEAK, TOBRARND, AMIKACINPEAK, AMIKACINTROU, AMIKACIN in the last 72 hours.   Microbiology: Recent Results (from the past 720 hour(s))  MRSA PCR Screening     Status: None   Collection Time: 06/14/14  6:56 PM  Result Value Ref Range Status   MRSA by PCR NEGATIVE NEGATIVE Final    Comment:        The GeneXpert MRSA Assay (FDA approved for NASAL specimens only), is one component of a comprehensive MRSA colonization surveillance program. It is not intended to diagnose MRSA infection nor to guide or monitor treatment for MRSA infections.   Surgical pcr screen     Status: None   Collection Time: 06/15/14  1:46 PM  Result Value Ref Range Status   MRSA, PCR NEGATIVE NEGATIVE Final   Staphylococcus aureus NEGATIVE NEGATIVE Final    Comment:        The Xpert SA Assay (FDA approved for NASAL specimens in patients over 27 years of age), is one component of a comprehensive surveillance program.  Test performance has been validated by [facility] for patients  greater than or equal to 91 year old. It is not intended to diagnose infection nor to guide or monitor treatment.   Surgical pcr screen     Status: None   Collection Time: 06/20/14  9:33 PM  Result Value Ref Range Status   MRSA, PCR NEGATIVE NEGATIVE Final   Staphylococcus aureus NEGATIVE NEGATIVE Final    Comment:        The Xpert SA Assay (FDA approved for NASAL specimens in patients over 68 years of age), is one component of a comprehensive surveillance program.  Test performance has been validated by [facility] for patients greater than or equal to 72 year old. It is not intended to diagnose infection nor to guide or monitor treatment.   Clostridium Difficile by PCR     Status: None   Collection Time: 06/27/14 10:25 AM  Result Value Ref Range Status   C difficile by pcr NEGATIVE NEGATIVE Final  MRSA PCR Screening     Status: None   Collection Time: 07/25/2014  5:00 PM  Result Value Ref Range Status   MRSA by PCR NEGATIVE NEGATIVE Final    Comment:        The GeneXpert MRSA Assay (FDA approved for NASAL specimens only), is one component of a comprehensive MRSA colonization surveillance program. It is not intended to diagnose MRSA infection nor to guide or monitor treatment for MRSA infections.   Culture, blood (routine x 2)     Status: None (Preliminary result)   Collection Time: 07/06/14  4:20 PM  Result Value Ref Range Status   Specimen Description BLOOD CENTRAL LINE  Final   Special Requests BOTTLES DRAWN AEROBIC ONLY 10CC  Final   Culture   Final    GRAM POSITIVE COCCI IN PAIRS AND CHAINS Note: Gram Stain Report Called to,Read Back By and Verified With: A AFATSAWO 07/07/14 1055 BY SMITHERSJ Performed at [facility]    Report Status PENDING  Incomplete  Culture, blood (routine x 2)     Status: None (Preliminary result)   Collection Time: 07/06/14  4:25 PM  Result Value Ref Range Status   Specimen Description BLOOD CENTRAL LINE  Final   Special  Requests BOTTLES DRAWN AEROBIC ONLY 10CC  Final   Culture   Final    GRAM POSITIVE COCCI IN PAIRS AND CHAINS Note: Gram Stain Report Called to,Read Back By and Verified With: A AFATSAWO 07/07/14 1055 BY SMITHERSJ Performed at [facility]    Report Status PENDING  Incomplete    Medical History: Past Medical History  Diagnosis Date  . Chest pain   . Hypertension   . Aortic aneurysm 1993  . 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 Pneumonia   . Aortic valve replaced      05/2014 transfered from [place] to [facility] for HF in the setting of failed valve repair. 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 CT surgery considering AVR, delayed due to GI bleed  . Hx of aortic aneurysm repair 06/14/2014  . Atrial fibrillation 05/2014  . Gastritis and gastroduodenitis with hemorrhage 05/2014    severe.   . Tubular adenoma of colon 12/2012  . DVT of upper extremity (deep vein thrombosis) 05/2014    left  . Acute blood loss anemia 05/2014   . CAD (coronary artery disease)     minimal CAD on cath 05/2014    Assessment: 69 yo male s/p cardiac arrest yesterday, awaiting AVR/root replacement.  Now with 2/2 blood cultures with GPC in pairs and chains.  WBC WNL and afebrile.  Pharmacy asked to start empiric vancomycin and zosyn.  Will need to monitor closely given rising Scr, and poor UOP.  Goal of Therapy:  Vancomycin trough level 15-20 mcg/ml  Plan:  1. Zosyn 3.375g IV q 8 hrs. 2. Vancomycin 2g x 1 now, then 1250 q 24 hrs. 3. Watch renal function closely for dosing adjustments. 4. F/u culture results.  Uvaldo Rising, BCPS  Clinical Pharmacist Pager [phone]  07/07/2014 11:13 AM

## 2014-07-07 NOTE — Progress Notes (Signed)
Pt urine output has been minimal per hour (15-30 mls/hr) since insertion of foley catheter. Notified MD Deterding. MD Deterding ordered to monitor for now. Will continue to monitor and assess.

## 2014-07-07 NOTE — Progress Notes (Signed)
CRITICAL VALUE ALERT  Critical value received:  Positive blood culture   Date of notification:  07/07/14  Time of notification:  7858  Critical value read back:Yes.    Nurse who received alert:  Leaman Abe, RN   MD notified (1st page):  1055  Time of first page:  1055  Responding MD:  Minor, NP  Time MD responded:  1055

## 2014-07-07 NOTE — Progress Notes (Signed)
ANTICOAGULATION CONSULT NOTE - Follow Up Consult  Pharmacy Consult for Heparin  Indication: DVT  No Known Allergies  Patient Measurements: Height: 5\' 8"  (172.7 cm) Weight: 231 lb 4.2 oz (104.9 kg) IBW/kg (Calculated) : 68.4  Vital Signs: Temp: 98 F (36.7 C) (06/11 0301) Temp Source: Oral (06/11 0301) BP: 142/92 mmHg (06/11 0200) Pulse Rate: 87 (06/11 0355)  Labs:  Recent Labs  07/04/14 0503 07/20/2014 3536 07/25/2014 1115 07/13/2014 1838 07/06/14 0519 07/06/14 1440 07/06/14 2003 07/06/14 2130 07/07/14 0202 07/07/14 0400  HGB 8.1* 7.7* 7.7*  --  8.9* 8.9*  --   --   --   --   HCT 25.8* 24.7* 24.4*  --  28.3* 28.5*  --   --   --   --   PLT 411* 376  --   --  303 256  --   --   --   --   HEPARINUNFRC 0.39  --   --   --   --   --   --  0.21*  --  0.45  CREATININE  --   --   --  1.28* 1.22 1.46*  --   --   --   --   TROPONINI  --   --   --   --   --  0.06* 0.07*  --  0.08*  --     Estimated Creatinine Clearance: 57.6 mL/min (by C-G formula based on Cr of 1.46).   Assessment: Therapeutic heparin level, note low goal  Goal of Therapy:  Heparin level 0.3-0.5 units/ml Monitor platelets by anticoagulation protocol: Yes   Plan:  -Continue heparin at 1650 units/hr -1100 HL -Daily CBC/HL -Monitor for bleeding  Mantaj, Chamberlin 07/07/2014,4:28 AM

## 2014-07-07 NOTE — Progress Notes (Signed)
PS decreased from 14 to 8 by MD.  Pt tolerating well.  Spoke with MD and he would like to dec PS to 5.  Pt is now on 5/5 PSV tolerating well.  Will get ABG X28mins to evaluate for extubation today.  RT will continue to monitor.

## 2014-07-07 NOTE — Progress Notes (Signed)
Patient ID: [name], male   DOB: [birth year], 69 y.o.   MRN: [id number]    Subjective:  Intubated/sedated   Objective:  Filed Vitals:   07/07/14 0600 07/07/14 0700 07/07/14 0752 07/07/14 0811  BP: 133/82 141/92  133/86  Pulse:    92  Temp:   98.8 F (37.1 C)   TempSrc:   Oral   Resp: 14 14  24   Height:      Weight:      SpO2:    100%    Intake/Output from previous day:  Intake/Output Summary (Last 24 hours) at 07/07/14 0827 Last data filed at 07/07/14 0400  Gross per 24 hour  Intake 931.81 ml  Output    455 ml  Net 476.81 ml    Physical Exam: Intubated  Chronically ill black male  HEENT: normal Neck supple with no adenopathy JVP normal no bruits no thyromegaly Lungs clear with no wheezing and good diaphragmatic motion Heart:  S1/S2click SEM and loud AR  murmur, no rub, gallop or click PMI normal Abdomen: benighn, BS positve, no tenderness, no AAA no bruit.  No HSM or HJR Distal pulses intact with no bruits No edema Neuro non-focal Skin warm and dry No muscular weakness   Lab Results: Basic Metabolic Panel:  Recent Labs  07/06/14 1440 07/07/14 0400  NA 133* 136  K 4.7 4.7  CL 103 106  CO2 21* 22  GLUCOSE 128* 90  BUN 12 18  CREATININE 1.46* 2.22*  CALCIUM 8.8* 8.9  MG 1.8 2.0   Liver Function Tests:  Recent Labs  07/06/14 1440 07/07/14 0400  AST 27 24  ALT 14* 14*  ALKPHOS 47 44  BILITOT 1.0 0.7  PROT 6.7 6.1*  ALBUMIN 2.7* 2.6*  CBC:  Recent Labs  07/06/14 1440 07/07/14 0400  WBC 9.7 9.9  HGB 8.9* 8.8*  HCT 28.5* 28.3*  MCV 89.3 89.3  PLT 256 286   Cardiac Enzymes:  Recent Labs  07/06/14 1440 07/06/14 2003 07/07/14 0202  TROPONINI 0.06* 0.07* 0.08*    Imaging: Dg Chest Port 1 View  07/07/2014   CLINICAL DATA:  Respiratory failure  EXAM: PORTABLE CHEST - 1 VIEW  COMPARISON:  Six hundred ten  FINDINGS: Endotracheal tube is 4.2 cm above the carina. The nasogastric tube extends into the stomach. There is a  satisfactorily positioned right upper extremity PICC line with tip in the SVC. There is no pneumothorax. There is improvement, with partial clearance of basilar opacities and with better definition of diaphragmatic contours. Moderate opacity persists in the left base.  IMPRESSION: Support equipment appears satisfactorily positioned.  Improved, with partial clearance of basilar opacities.   Electronically Signed   By: Andreas Newport [name]   On: 07/07/2014 06:37   Dg Chest Port 1 View  07/06/2014   CLINICAL DATA:  Respiratory failure.  EXAM: PORTABLE CHEST - 1 VIEW  COMPARISON:  06/24/2014 .  FINDINGS: Endotracheal tube noted with its tip 1.1 cm above the carina. Right PICC line noted with tip at the cavoatrial junction. Right IJ line in stable position with tip projected over the cavoatrial junction. Cardiomegaly normal pulmonary vascularity. Bibasilar atelectasis and/or infiltrates with small bilateral pleural effusions. No pneumothorax .  IMPRESSION: 1. Interim placement of endotracheal tube, its tip is 1.1 cm above the carina. Proximal repositioning of approximately 2 cm should be considered. 2. Right IJ line and right PICC line in good anatomic position. 3. Stable cardiomegaly. 4. Bibasilar atelectasis and/or infiltrates with possible small  bilateral pleural effusions. Critical Value/emergent results were called by telephone at the time of interpretation on 07/06/2014 at 3:59 pm to nurse Amy , who verbally acknowledged these results.   Electronically Signed   By: Marcello Moores  Register   On: 07/06/2014 16:06   Dg Abd Portable 1v  07/06/2014   CLINICAL DATA:  Nasogastric catheter placement  EXAM: PORTABLE ABDOMEN - 1 VIEW  COMPARISON:  None.  FINDINGS: Scattered large and small bowel gas is noted. A nasogastric catheter is noted within the distal aspect of the stomach in satisfactory position.  IMPRESSION: Nasogastric catheter in satisfactory position.   Electronically Signed   By: Inez Catalina [name]   On:  07/06/2014 19:43    Cardiac Studies:  ECG:  afib low voltage old IMI    Telemetry: Afib  no further VT   Echo:   Medications:   . antiseptic oral rinse  7 mL Mouth Rinse QID  . chlorhexidine  15 mL Mouth Rinse BID  . fentaNYL (SUBLIMAZE) injection  50 mcg Intravenous Once  . ferrous sulfate  325 mg Oral Q breakfast  . pantoprazole  40 mg Intravenous Q12H  . sodium chloride  10-40 mL Intracatheter Q12H  . sodium chloride  3 mL Intravenous Q12H  . sodium chloride  3 mL Intravenous Q12H     . fentaNYL infusion INTRAVENOUS 100 mcg/hr (07/06/14 1900)  . heparin 1,650 Units/hr (07/06/14 2147)    Assessment/Plan:  VT Arrest:  Likely low output from failing AVR or ischemic from tortuous RCA limb of [place]raft.  Dr Prescott Gum aware   Had some Iv amiodarone yesterday not on drip now would resume for any recurrence of NSVT  CAD:  Native arteries ok but distal RCA not well seen at cath from [place].  Will need reimplantation of cors at time of AVR/root replacement Issues with true/false lumens in ascending root may also be playing a rold  AVR:  Long standing non compliance with anticoagution.  One leaflet non functioning and other limited motion leading to AS and AR.  Repeat Surgery when possible  Back on heparin after dental procedure.  Jenkins Rouge 07/07/2014, 8:27 AM

## 2014-07-07 NOTE — Progress Notes (Signed)
Hypoglycemic Event  CBG: 66   Treatment: D50 IV 50 mL  Symptoms: None  Follow-up CBG: Time:  1142 CBG Result: 130  Possible Reasons for Event: Inadequate meal intake  Comments/MD notified: May extubate pt later today and start feeding him    Prajna Vanderpool Djigbodi  Remember to initiate Hypoglycemia Order Set & complete

## 2014-07-07 NOTE — Consult Note (Signed)
PULMONARY / CRITICAL CARE MEDICINE   Name: [name] MRN: [id number] DOB: 07/09/1945    ADMISSION DATE:  07/13/2014 CONSULTATION DATE:  07/06/14  REFERRING MD :  Dr. Thereasa Solo   CHIEF COMPLAINT:  Cardiac Arrest   INITIAL PRESENTATION: 69 y/o M initially admitted to [facility] on 5/19 with SOB in the setting of AV insufficiency after developing a partially thrombosed mechanical AV after stopping coumadin for unclear reasons.  He was scheduled for an AVR on 5/26 but developed recurrent melena.  He underwent an EGD with clipping of gastric erosion on 5/26.  Course was complicated by DVT of the LUE, anemia, dental caries and deconditioning of critical illness.  He was transferred to [place] on 6/3 for planned rehab prior to AVR.  6/7 the patient was taken for multiple tooth extraction.  6/10 afternoon, he suffered a VT arrest requiring ~ 2 min of CPR.  He was intubated post CPR and PCCM consulted for evaluation.    STUDIES:    SIGNIFICANT EVENTS: 5/19-6/03  Admit to [facility] for cardiogenic shock secondary to AV insufficiency  5/20  Cardiac Cath >> non-obstructive disease, 10% stenosis in LAD & L circumflex  6/03-6/09  Admit to [facility] CIR for rehab.  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 [name] who initially presented to [facility] on 06/13/2014 complaining of SOB. While at [facility], he was found to have severe aortic insufficiency and transferred to [facility] for further evaluation.    Work up was consistent with a partially thrombosed mechanical valve after coumadin was stopped for unclear reasons (coumadin was stopped 3 months prior to 5/19 admit).  He was treated  with intravenous heparin. TEE completed showing ejection fraction of 60%. Only one of the leaflets could be visualized as moving movement was significantly reduced. There was severe aortic insufficiency. 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 Cardiac catheterization showed no significant CAD. Cardiothoracic surgery consulted for prosthetic valve dysfunction and need for surgical intervention. He was planned for AVR revision after rehab.  Hospital course complicated by GI bleed/melena with gastroenterology consulted 06/17/2014. EGD completed 06/18/2014 showing 2 gastric erosions without active bleeding. Patient required transfusion during hospital course. On 06/21/2014 with increased loose tarry stools EGD was repeated showing large gastric blood clot with erosion visible inferior to the GE junction. Hospital course was further complicated by an acute DVT in the left brachial and axillary veins (dx'd 06/24/2014).   He was transferred to [facility] on 6/3 for rehab efforts prior to redo aortic root replacement.  He was seen by Dr. Enrique Sack on 6/7 for dental carries and underwent extraction on 6/9 of multiple teeth (tooth numbers 3, 4, 5, 13, 14, 21, 23, 24, 25, and 26).  He was discharged from CIR back to [facility] post-operatively for close observation.  Anticoagulation was held for procedure and through 6/10.  Notes reflected planned AVR for 7-10 days post extraction.  On 6/10 early afternoon, he was noted to have a wide complex / VT arrest requiring  approximately 2 minutes of CPR.       SUBJECTIVE:   VITAL SIGNS: Temp:  [98 F (36.7 C)-99.3 F (37.4 C)] 98.8 F (37.1 C) (06/11 0752) Pulse Rate:  [69-106] 92 (06/11 0811) Resp:  [14-29] 24 (06/11 0811) BP: (111-166)/(60-97) 133/86 mmHg (06/11 0811) SpO2:  [96 %-100 %] 100 % (06/11 0811) FiO2 (%):  [40 %-60 %] 40 % (06/11 0811) Weight:  [228 lb 2.8 oz (103.5 kg)-231 lb  4.2 oz (104.9 kg)] 231 lb 4.2 oz (104.9 kg) (06/11 0352)   HEMODYNAMICS:     VENTILATOR SETTINGS: Vent Mode:  [-] PSV FiO2 (%):  [40 %-60 %] 40 % Set Rate:  [14 bmp] 14 bmp Vt Set:  [550 mL] 550 mL PEEP:  [5 cmH20] 5 cmH20 Pressure Support:  [14 cmH20] 14 cmH20 Plateau Pressure:  [12 cmH20-22 cmH20] 22 cmH20   INTAKE / OUTPUT:  Intake/Output Summary (Last 24 hours) at 07/07/14 0830 Last data filed at 07/07/14 0400  Gross per 24 hour  Intake 931.81 ml  Output    455 ml  Net 476.81 ml    PHYSICAL EXAMINATION: General:  Chronically ill adult male in NAD on vent  Neuro:  Arouses to voice, awake/ answers appropriately  HEENT:  MM pink/moist, no jvd Cardiovascular:  s1s2 irr irr, rate in 90's, mechanical valve click, 5-1/7 murmur heard best 2nd ICS RSB Lungs:  resp's even/non-labored, lungs bilaterally coarse Abdomen:  Obese/soft, NTND, hernia / soft Musculoskeletal:  No acute deformities  Skin:  Warm/dry, BUE 1-2+ edema, trace lower  LABS:  CBC  Recent Labs Lab 07/06/14 0519 07/06/14 1440 07/07/14 0400  WBC 7.8 9.7 9.9  HGB 8.9* 8.9* 8.8*  HCT 28.3* 28.5* 28.3*  PLT 303 256 286   Coag's No results for input(s): APTT, INR in the last 168 hours.   BMET  Recent Labs Lab 07/06/14 0519 07/06/14 1440 07/07/14 0400  NA 134* 133* 136  K 4.6 4.7 4.7  CL 102 103 106  CO2 23 21* 22  BUN 12 12 18   CREATININE 1.22 1.46* 2.22*  GLUCOSE 119* 128* 90   Electrolytes  Recent Labs Lab 07/06/14 0519 07/06/14 1440 07/07/14 0400  CALCIUM 8.7* 8.8* 8.9  MG  --  1.8 2.0   Sepsis Markers No results for input(s): LATICACIDVEN, PROCALCITON, O2SATVEN in the last 168 hours.   ABG  Recent Labs Lab 07/06/14 1420  PHART 7.316*  PCO2ART 40.0  PO2ART 182*     Liver Enzymes  Recent Labs Lab 07/06/14 0519 07/06/14 1440 07/07/14 0400  AST 21 27 24   ALT 12* 14* 14*  ALKPHOS 47 47 44  BILITOT 0.7 1.0 0.7  ALBUMIN 2.6* 2.7* 2.6*   Cardiac Enzymes  Recent  Labs Lab 07/06/14 1440 07/06/14 2003 07/07/14 0202  TROPONINI 0.06* 0.07* 0.08*     Glucose  Recent Labs Lab 07/06/14 1556 07/06/14 1952 07/06/14 1953 07/06/14 2344 07/07/14 0341 07/07/14 0750  GLUCAP 120* 80 95 96 85 66    Imaging Dg Chest Port 1 View  07/07/2014   CLINICAL DATA:  Respiratory failure  EXAM: PORTABLE CHEST - 1 VIEW  COMPARISON:  Six hundred ten  FINDINGS: Endotracheal tube is 4.2 cm above the carina. The nasogastric tube extends into the stomach. There is a satisfactorily positioned right upper extremity PICC line with tip in the SVC. There is no pneumothorax. There is improvement, with partial clearance of basilar opacities and with better definition of diaphragmatic contours. Moderate opacity persists in the left base.  IMPRESSION: Support equipment appears satisfactorily positioned.  Improved, with partial clearance of basilar opacities.   Electronically Signed   By: Andreas Newport [name]   On: 07/07/2014 06:37   Dg Chest Port 1 View  07/06/2014   CLINICAL DATA:  Respiratory failure.  EXAM: PORTABLE CHEST - 1 VIEW  COMPARISON:  06/24/2014 .  FINDINGS: Endotracheal tube noted with its tip 1.1 cm above the carina. Right PICC line noted with tip at the cavoatrial junction. Right IJ line in stable position with tip projected over the cavoatrial junction. Cardiomegaly normal pulmonary vascularity. Bibasilar atelectasis and/or infiltrates with small bilateral pleural effusions. No pneumothorax .  IMPRESSION: 1. Interim placement of endotracheal tube, its tip is 1.1 cm above the carina. Proximal repositioning of approximately 2 cm should be considered. 2. Right IJ line and right PICC line in good anatomic position. 3. Stable cardiomegaly. 4. Bibasilar atelectasis and/or infiltrates with possible small bilateral pleural effusions. Critical Value/emergent results were called by telephone at the time of interpretation on 07/06/2014 at 3:59 pm to nurse [name] , who verbally  acknowledged these results.   Electronically Signed   By: Marcello Moores  Register   On: 07/06/2014 16:06   Dg Abd Portable 1v  07/06/2014   CLINICAL DATA:  Nasogastric catheter placement  EXAM: PORTABLE ABDOMEN - 1 VIEW  COMPARISON:  None.  FINDINGS: Scattered large and small bowel gas is noted. A nasogastric catheter is noted within the distal aspect of the stomach in satisfactory position.  IMPRESSION: Nasogastric catheter in satisfactory position.   Electronically Signed   By: Inez Catalina [name]   On: 07/06/2014 19:43   ASSESSMENT / PLAN:  PULMONARY OETT A: Acute Respiratory Failure - in setting of cardiac arrest P:   MV support, 8 cc/kg Wean PEEP / FiO2 for sats > 92% Trend CXR  VAP prevention measures  6/11 will push weaning. He will need AVR redo soon. Valvular issues may impede weaning along with renal insuffiencey.   CARDIOVASCULAR CVL R IJ >> d/c'd 6/10 RUE PICC 6/10 >>  A:  VT Cardiac Arrest - suspected ischemic / low output state as cause of cardiac arrest  AV Insufficiency s/p AVR with need for revision Atrial Fibrillation  Hx of HTN, AVR P:  Tele monitoring  Assess troponin, EKG  Begin heparin gtt with concern for thrombosed valve (discussed with Dr. Johnsie Cancel) Amiodarone per Cardiology Cardiology consulted, appreciate input  RENAL Lab Results  Component Value Date   CREATININE 2.22* 07/07/2014   CREATININE 1.46* 07/06/2014   CREATININE 1.22 07/06/2014    A:    AKI - s/p cardiac arrest P:   Trend BMP / UOP  Replace electrolytes as indicated   GASTROINTESTINAL A:   Recent GIB / Gastric Erosion s/p Clipping  Protein Calorie Malnutrition Hyponatremia   P:   PPI  NPO OGT, consider TF in am 6/11 if remains intubated , pushing wean 6/11 PRN zofran  Monitor for bleeding with anticoagulation and recent bleed   HEMATOLOGIC A:   Anemia - s/p transfusion 6/9 DVT LUE  P:  Trend CBC SCD's for DVT prophylaxis   INFECTIOUS A:  Dental Carries s/p Extraction -  6/9, 10 teeth removed P:   BCx2 6/10 >>  No abx Trend fever curve / leukocytosis  ENDOCRINE A:   Mild Hyperglycemia   P:   Monitor glucose on BMP  NEUROLOGIC A:   Pain - post CPR / dental extraction.  Mental status appears to be at baseline after arrest  P:  RASS goal:  -1 PRN versed / Fentanyl Serial neuro exams  FAMILY  - Updates: Family updated at bedside.   - Inter-disciplinary family meet or Palliative Care meeting due by:  6/17  [name] ACNP [name] PCCM Pager [phone] till 3 pm If no answer page [phone] 07/07/2014, 8:33 AM  Patient is more responsive this AM, tolerating weaning well.  Will SBT now and if ok will extubate.  Lungs are clear to auscultation.  Keep fluid even at this time.  Will attempt extubation after SBT.  The patient is critically ill with multiple organ systems failure and requires high complexity decision making for assessment and support, frequent evaluation and titration of therapies, application of advanced monitoring technologies and extensive interpretation of multiple databases.   Critical Care Time devoted to patient care services described in this note is  35  Minutes. This time reflects time of care of this signee Dr Jennet Maduro. This critical care time does not reflect procedure time, or teaching time or supervisory time of PA/NP/Med student/Med Resident etc but could involve care discussion time.  Rush Farmer, [name] [facility] Pulmonary/Critical Care Medicine. Pager: [phone]. After hours pager: [phone].

## 2014-07-07 NOTE — Progress Notes (Signed)
Initial Nutrition Assessment   INTERVENTION: Initiate Vital 1.2 via OGT.  Day 1 of TF protocol start feed at 25 mL/hr for the remainder of the day.  Add Prostat 30 ml daily.  Day 2 of TF protocol at 0600 start new goal rate of 1200 mL (50 mL/hr) run from 0600-05:59.    Tube feeding regimen provides 1540 kcal, 105 grams of protein, and 973 ml of H2O.    NUTRITION DIAGNOSIS:  Inadequate oral intake related to inability to eat as evidenced by vent status   GOAL: Hypocaloric feeding to meet 100% protein and 70% energy needs.  MONITOR: respiratory status,  tolerance of tube feeding and transititon to oral feeding    REASON FOR ASSESSMENT:  Consult Enteral/tube feeding initiation and management  ASSESSMENT: Patient is currently intubated on ventilator support MV: 11.9 L/min Temp (24hrs), Avg:98.8 F (37.1 C), Min:98 F (36.7 C), Max:99.3 F (37.4 C)  Height:  Ht Readings from Last 1 Encounters:  07/14/2014 5\' 8"  (1.727 m)    Weight:  Wt Readings from Last 1 Encounters:  07/07/14 231 lb 4.2 oz (104.9 kg)    Ideal Body Weight:  70 kg  Wt Readings from Last 10 Encounters:  07/07/14 231 lb 4.2 oz (104.9 kg)  06/29/2014 226 lb (102.513 kg)  06/29/14 220 lb 0.3 oz (99.8 kg)  12/27/12 223 lb (101.152 kg)  11/30/12 223 lb 12.8 oz (101.515 kg)  01/13/10 236 lb (107.049 kg)    BMI:  Body mass index is 35.17 kg/(m^2).  Estimated Nutritional Needs:  Kcal:  2103  Protein:  105  Fluid:  >/= 1500 ml daily  Skin:   WDL  Diet Order:  Diet NPO time specified  EDUCATION NEEDS: none at this time     Intake/Output Summary (Last 24 hours) at 07/07/14 1357 Last data filed at 07/07/14 1344  Gross per 24 hour  Intake 3080.98 ml  Output    640 ml  Net 2440.98 ml    Last BM:  6/8  Colman Cater MS,RD,CSG,LDN Office: [phone] Pager: [phone]

## 2014-07-08 ENCOUNTER — Inpatient Hospital Stay (HOSPITAL_COMMUNITY): Payer: Commercial Managed Care - HMO

## 2014-07-08 DIAGNOSIS — Z952 Presence of prosthetic heart valve: Secondary | ICD-10-CM

## 2014-07-08 DIAGNOSIS — N289 Disorder of kidney and ureter, unspecified: Secondary | ICD-10-CM

## 2014-07-08 DIAGNOSIS — R7881 Bacteremia: Secondary | ICD-10-CM | POA: Diagnosis not present

## 2014-07-08 DIAGNOSIS — I10 Essential (primary) hypertension: Secondary | ICD-10-CM

## 2014-07-08 DIAGNOSIS — R4182 Altered mental status, unspecified: Secondary | ICD-10-CM

## 2014-07-08 LAB — BLOOD GAS, ARTERIAL
Acid-base deficit: 2.9 mmol/L — ABNORMAL HIGH (ref 0.0–2.0)
Bicarbonate: 21.5 mEq/L (ref 20.0–24.0)
DRAWN BY: 280981
FIO2: 0.4 %
O2 SAT: 98.9 %
PEEP: 5 cmH2O
PO2 ART: 114 mmHg — AB (ref 80.0–100.0)
PRESSURE SUPPORT: 5 cmH2O
Patient temperature: 98.6
TCO2: 22.7 mmol/L (ref 0–100)
pCO2 arterial: 38.1 mmHg (ref 35.0–45.0)
pH, Arterial: 7.371 (ref 7.350–7.450)

## 2014-07-08 LAB — GLUCOSE, CAPILLARY
GLUCOSE-CAPILLARY: 110 mg/dL — AB (ref 65–99)
GLUCOSE-CAPILLARY: 93 mg/dL (ref 65–99)
GLUCOSE-CAPILLARY: 95 mg/dL (ref 65–99)
Glucose-Capillary: 108 mg/dL — ABNORMAL HIGH (ref 65–99)
Glucose-Capillary: 115 mg/dL — ABNORMAL HIGH (ref 65–99)
Glucose-Capillary: 114 mg/dL — ABNORMAL HIGH (ref 65–99)

## 2014-07-08 LAB — HEPARIN LEVEL (UNFRACTIONATED)
Heparin Unfractionated: 0.7 IU/mL (ref 0.30–0.70)
Heparin Unfractionated: 0.61 IU/mL (ref 0.30–0.70)

## 2014-07-08 LAB — CBC
HCT: 26.3 % — ABNORMAL LOW (ref 39.0–52.0)
HEMOGLOBIN: 8.4 g/dL — AB (ref 13.0–17.0)
MCH: 27.9 pg (ref 26.0–34.0)
MCHC: 31.9 g/dL (ref 30.0–36.0)
MCV: 87.4 fL (ref 78.0–100.0)
PLATELETS: 262 10*3/uL (ref 150–400)
RBC: 3.01 MIL/uL — AB (ref 4.22–5.81)
RDW: 17.7 % — ABNORMAL HIGH (ref 11.5–15.5)
WBC: 8.6 10*3/uL (ref 4.0–10.5)

## 2014-07-08 LAB — BASIC METABOLIC PANEL
Anion gap: 7 (ref 5–15)
BUN: 22 mg/dL — AB (ref 6–20)
CALCIUM: 8.6 mg/dL — AB (ref 8.9–10.3)
CO2: 23 mmol/L (ref 22–32)
Chloride: 104 mmol/L (ref 101–111)
Creatinine, Ser: 2.18 mg/dL — ABNORMAL HIGH (ref 0.61–1.24)
GFR calc non Af Amer: 30 mL/min — ABNORMAL LOW (ref >60)
GFR, EST AFRICAN AMERICAN: 34 mL/min — AB (ref >60)
Glucose, Bld: 113 mg/dL — ABNORMAL HIGH (ref 65–99)
Potassium: 4.1 mmol/L (ref 3.5–5.1)
Sodium: 134 mmol/L — ABNORMAL LOW (ref 135–145)

## 2014-07-08 LAB — MAGNESIUM: Magnesium: 2 mg/dL (ref 1.7–2.4)

## 2014-07-08 LAB — PHOSPHORUS: Phosphorus: 4.2 mg/dL (ref 2.5–4.6)

## 2014-07-08 MED ORDER — HEPARIN (PORCINE) IN NACL 100-0.45 UNIT/ML-% IJ SOLN
1500.0000 [IU]/h | INTRAMUSCULAR | Status: DC
  Administered 2014-07-08: 1500 [IU]/h via INTRAVENOUS
  Filled 2014-07-08 (×2): qty 250

## 2014-07-08 MED ORDER — CETYLPYRIDINIUM CHLORIDE 0.05 % MT LIQD: 7.0000 mL | Freq: Two times a day (BID) | OROMUCOSAL | Status: DC

## 2014-07-08 MED ORDER — SODIUM CHLORIDE 0.9 % IV SOLN
2.0000 g | Freq: Three times a day (TID) | INTRAVENOUS | Status: DC
  Administered 2014-07-08 – 2014-07-10 (×6): 2 g via INTRAVENOUS
  Filled 2014-07-08 (×8): qty 2000

## 2014-07-08 MED ORDER — CHLORHEXIDINE GLUCONATE 0.12 % MT SOLN
15.0000 mL | Freq: Two times a day (BID) | OROMUCOSAL | Status: DC
  Filled 2014-07-08: qty 15

## 2014-07-08 MED ORDER — CEFTRIAXONE SODIUM IN DEXTROSE 40 MG/ML IV SOLN
2.0000 g | INTRAVENOUS | Status: DC
  Administered 2014-07-08: 2 g via INTRAVENOUS
  Filled 2014-07-08 (×2): qty 50

## 2014-07-08 MED ORDER — HEPARIN (PORCINE) IN NACL 100-0.45 UNIT/ML-% IJ SOLN
1250.0000 [IU]/h | INTRAMUSCULAR | Status: DC
  Administered 2014-07-09: 1250 [IU]/h via INTRAVENOUS
  Filled 2014-07-08 (×4): qty 250

## 2014-07-08 MED ORDER — WHITE PETROLATUM GEL
Status: AC
  Administered 2014-07-08: 0.2
  Filled 2014-07-08: qty 1

## 2014-07-08 NOTE — Evaluation (Signed)
Physical Therapy Evaluation Patient Details Name: [name] MRN: [id number] DOB: 06-22-1945 Today's Date: 07/08/2014   History of Present Illness  69 y.o. 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 Plan to have AVR redo by Dr Nils Pyle at the end of June. He underwent Multiple tooth extractions on 07/22/2014.  Post op complicated by VT arrest requiring ~ 2 min of CPR. Intubated 07/06/14.  Extubated 07/08/14.  Clinical Impression  Pt admitted with above diagnosis. Pt currently with functional limitations due to the deficits listed below (see PT Problem List).  Pt will benefit from skilled PT to increase their independence and safety with mobility.  Recommend patient return to CIR for continued therapy for hopeful return to home.      Follow Up Recommendations CIR    Equipment Recommendations  Rolling walker with 5" wheels;3in1 (PT)    Recommendations for Other Services OT consult     Precautions / Restrictions Precautions Precautions: Fall Restrictions Weight Bearing Restrictions: No      Mobility  Bed Mobility Overal bed mobility: Needs Assistance Bed Mobility: Supine to Sit     Supine to sit: Mod assist     General bed mobility comments: sat EOB x 5 minutes with supervision, no loss of balance  Transfers                    Ambulation/Gait                Stairs            Wheelchair Mobility    Modified Rankin (Stroke Patients Only)       Balance   Sitting-balance support: No upper extremity supported Sitting balance-Leahy Scale: Good                                       Pertinent Vitals/Pain Pain Assessment: Faces Pain Score: 3  Pain Location: mouth Pain  Intervention(s): Monitored during session    Home Living     Available Help at Discharge: Family;Available 24 hours/day Type of Home: House Home Access: Ramped entrance     Home Layout: One level   Additional Comments: Wife is bedridden, per patient    Prior Function Level of Independence: Independent with assistive device(s)               Hand Dominance   Dominant Hand: Right    Extremity/Trunk Assessment   Upper Extremity Assessment: Defer to OT evaluation             RLE Deficits / Details: able to lift legs off bed and long kick while sitting EOB. LLE Deficits / Details: able to lift legs off bed and long kick while sitting EOB.     Communication   Communication: Other (comment) (slow to respond at times and speaks softly)  Cognition Arousal/Alertness: Awake/alert Behavior During Therapy: Flat affect Overall Cognitive Status: No family/caregiver present to determine baseline cognitive functioning                      General Comments      Exercises        Assessment/Plan    PT Assessment  Patient needs continued PT services  PT Diagnosis Generalized weakness   PT Problem List Decreased strength;Decreased activity tolerance;Decreased balance;Decreased mobility;Decreased knowledge of use of DME;Cardiopulmonary status limiting activity  PT Treatment Interventions DME instruction;Gait training;Stair training;Functional mobility training;Therapeutic activities;Therapeutic exercise;Balance training;Patient/family education   PT Goals (Current goals can be found in the Care Plan section) Acute Rehab PT Goals Patient Stated Goal: none stated today PT Goal Formulation: With patient Time For Goal Achievement: 07/22/14 Potential to Achieve Goals: Good    Frequency Min 3X/week   Barriers to discharge   per chart, wife is disabled and is being cared for at home by children.    Co-evaluation               End of Session   Activity  Tolerance: Patient tolerated treatment well Patient left: in bed;with call bell/phone within reach;with nursing/sitter in room Nurse Communication: Mobility status         Time: 3382-5053 PT Time Calculation (min) (ACUTE ONLY): 22 min   Charges:   PT Evaluation $Initial PT Evaluation Tier I: 1 Procedure     PT G CodesShanna Cisco 07/08/2014, 3:36 PM  07/08/2014 [place], [place]

## 2014-07-08 NOTE — Procedures (Signed)
Extubation Procedure Note  Patient Details:   Name: JAXTEN BROSH DOB: 1945/11/06 MRN: [id number]   Airway Documentation:  Pt extubated at this time per Dr. Nelda Marseille at this time.  Placed on 4L [place] tolerating well.  No distress noted at this time.  Evaluation  O2 sats: stable throughout Complications: No apparent complications Patient did tolerate procedure well. Bilateral Breath Sounds: Clear, Diminished Suctioning: Airway Yes  Ciro Backer 07/08/2014, 11:28 AM

## 2014-07-08 NOTE — Progress Notes (Signed)
ANTICOAGULATION CONSULT NOTE - Follow Up Consult  Pharmacy Consult for Heparin  Indication: DVT  No Known Allergies  Patient Measurements: Height: 5\' 8"  (172.7 cm) Weight: 233 lb 0.4 oz (105.7 kg) IBW/kg (Calculated) : 68.4  Vital Signs: Temp: 98.3 F (36.8 C) (06/12 1954) Temp Source: Oral (06/12 1954) BP: 141/96 mmHg (06/12 1900) Pulse Rate: 101 (06/12 1900)  Labs:  Recent Labs  07/06/14 1440 07/06/14 2003  07/07/14 0202 07/07/14 0400 07/07/14 1113 07/08/14 0243 07/08/14 0400 07/08/14 1823  HGB 8.9*  --   --   --  8.8*  --   --  8.4*  --   HCT 28.5*  --   --   --  28.3*  --   --  26.3*  --   PLT 256  --   --   --  286  --   --  262  --   HEPARINUNFRC  --   --   < >  --  0.45 0.50 0.70  --  0.61  CREATININE 1.46*  --   --   --  2.22*  --   --  2.18*  --   TROPONINI 0.06* 0.07*  --  0.08*  --   --   --   --   --   < > = values in this interval not displayed.  Estimated Creatinine Clearance: 38.7 mL/min (by C-G formula based on Cr of 2.18).   Assessment: 69 yo male admitted with SOB, AV insufficiency. Scheduled for AVR 5/26 but developed recurrent melena, s/p EGD with clipping of gastric erosion 5/26. Course complicated by DVT. On 6/7 taken for multiple tooth extractions, and anticoagulation was held. Now s/p VT arrest. Spoke to Dr Halford Chessman, he and cardiologist feel that anticoagulation is necessary and the benefit outweighs the risk of bleeding.  Heparin level currently above lower goal range on 1500 units/hr.  No bleeding or complications noted.  Goal of Therapy:  Heparin level 0.3-0.5 units/ml Monitor platelets by anticoagulation protocol: Yes   Plan:  -Decrease IV heparin to 1400 units/hr. -Recheck heparin level in 6 hrs. (drawing levels from foot stick since heparin infusing in central line) -Daily CBC/HL -Monitor for bleeding  Uvaldo Rising, BCPS  Clinical Pharmacist Pager [phone]  07/08/2014 8:20 PM

## 2014-07-08 NOTE — Consult Note (Signed)
[name]    Date of Admission:  07/17/2014   Day 2 vancomycin       Day 2 piperacillin tazobactam       Reason for Consult: Automatic consultation for enterococcal bacteremia   Principal Problem:   Positive blood cultures Active Problems:   Aortic valve insufficiency   Ventricular tachycardia   H/O aortic valve replacement   Acute renal insufficiency   Hx of aortic aneurysm repair   Atrial fibrillation, do not know length of time   Gastric erosion   Acute respiratory failure, unspecified whether with hypoxia or hypercapnia   Anemia   Essential hypertension   . antiseptic oral rinse  7 mL Mouth Rinse QID  . chlorhexidine  15 mL Mouth Rinse BID  . feeding supplement (PRO-STAT SUGAR FREE 64)  30 mL Per Tube Daily  . feeding supplement (VITAL HIGH PROTEIN)  1,000 mL Per Tube Q24H  . fentaNYL (SUBLIMAZE) injection  50 mcg Intravenous Once  . ferrous sulfate  325 mg Oral Q breakfast  . pantoprazole  40 mg Intravenous Q12H  . piperacillin-tazobactam (ZOSYN)  IV  3.375 g Intravenous 3 times per day  . sodium chloride  10-40 mL Intracatheter Q12H  . sodium chloride  3 mL Intravenous Q12H  . sodium chloride  3 mL Intravenous Q12H  . vancomycin  1,250 mg Intravenous Q24H    Recommendations: 1. Change in her buttocks to IV ampicillin and ceftriaxone   Assessment: It is impossible to know if his positive blood cultures reflect contamination from his central line or whether he actually had true bacteremia. I favor the former but given that he is at very high risk for prosthetic valve endocarditis I will continue to drug therapy for presumed enterococcal bacteremia. Given that he has acute renal insufficiency I will treat him with ampicillin and ceftriaxone pending final susceptibility results.    HPI: [name] is a 69 y.o. male who was hospitalized on 06/14/2014 with atrial fibrillation and heart failure due to dysfunction of his [place] aortic valve and aortic insufficiency. Hospitalization has been complicated further by a DVT and upper GI bleeding. He was transferred to rehabilitation on 06/29/2014 to continue physical therapy in anticipation of aortic valve replacement once he was stronger. He underwent multiple dental extractions on 07/21/2014. He developed ventricular tachycardia and arrested on 07/06/2014 requiring 2 minutes of CPR. Apparently during or shortly after the arrest he had blood cultures obtained through his central line that had been placed on 06/16/2014. Both blood cultures are growing enterococcus. He has not had any fever. His central line has been removed.   Review of Systems: Pertinent items are noted in HPI.  Past Medical History  Diagnosis Date  . Chest pain   . Hypertension   . Aortic aneurysm 1993  . 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 Pneumonia   . Aortic valve replaced      05/2014 transfered [facility] [place] to [facility] for HF in the setting of failed valve repair. 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 CT surgery considering AVR, delayed due to GI bleed  . Hx of aortic aneurysm repair 06/14/2014  . Atrial fibrillation 05/2014  . Gastritis and gastroduodenitis with hemorrhage 05/2014    severe.   . Tubular adenoma of colon 12/2012  . DVT of upper extremity (deep vein thrombosis) 05/2014    left  . Acute  blood loss anemia 05/2014   . CAD (coronary artery disease)     minimal CAD on cath 05/2014    History  Substance Use Topics  . Smoking status: Former Research scientist (life sciences)  . Smokeless tobacco: Not on file  . Alcohol Use: Yes     Comment: 3-4 beers per day    Family History  Problem Relation Age of Onset  . Hypertension Father 53  . Diabetes Mother 47  . Colon cancer Neg Hx    No Known Allergies  OBJECTIVE: Blood pressure 127/91, pulse 25, temperature 98 F (36.7 C), temperature source Oral, resp. rate 18, height 5\' 8"  (1.727 m), weight 233 lb 0.4 oz  (105.7 kg), SpO2 100 %. General: He is very lethargic postextubation earlier this morning. He is alert and suctioning himself Skin: No rash. No splinter or conjunctival hemorrhages Lungs: Clear anteriorly Cor: Irregular S1 and S2 with systolic and diastolic murmurs Abdomen: Obese, soft and nontender. He has an old surgical incision across his abdomen. He has a large nontender hernia on his right abdomen Neuro: He is alert and follows commands but does not answer questions  Lab Results Lab Results  Component Value Date   WBC 8.6 07/08/2014   HGB 8.4* 07/08/2014   HCT 26.3* 07/08/2014   MCV 87.4 07/08/2014   PLT 262 07/08/2014    Lab Results  Component Value Date   CREATININE 2.18* 07/08/2014   BUN 22* 07/08/2014   NA 134* 07/08/2014   K 4.1 07/08/2014   CL 104 07/08/2014   CO2 23 07/08/2014    Lab Results  Component Value Date   ALT 14* 07/07/2014   AST 24 07/07/2014   ALKPHOS 44 07/07/2014   BILITOT 0.7 07/07/2014     Microbiology: Recent Results (from the past 240 hour(s))  MRSA PCR Screening     Status: None   Collection Time: 07/13/2014  5:00 PM  Result Value Ref Range Status   MRSA by PCR NEGATIVE NEGATIVE Final    Comment:        The GeneXpert MRSA Assay (FDA approved for NASAL specimens only), is one component of a comprehensive MRSA colonization surveillance program. It is not intended to diagnose MRSA infection nor to guide or monitor treatment for MRSA infections.   Culture, blood (routine x 2)     Status: None (Preliminary result)   Collection Time: 07/06/14  4:20 PM  Result Value Ref Range Status   Specimen Description BLOOD CENTRAL LINE  Final   Special Requests BOTTLES DRAWN AEROBIC ONLY 10CC  Final   Culture   Final    ENTEROCOCCUS SPECIES Note: Gram Stain Report Called to,Read Back By and Verified With: A AFATSAWO 07/07/14 1055 BY [email or web address] Performed at [facility]    Report Status PENDING  Incomplete  Culture, blood (routine x 2)      Status: None (Preliminary result)   Collection Time: 07/06/14  4:25 PM  Result Value Ref Range Status   Specimen Description BLOOD CENTRAL LINE  Final   Special Requests BOTTLES DRAWN AEROBIC ONLY 10CC  Final   Culture   Final    ENTEROCOCCUS SPECIES Note: Gram Stain Report Called to,Read Back By and Verified With: A AFATSAWO 07/07/14 1055 BY SMITHERSJ Performed at [facility]    Report Status PENDING  Incomplete    Michel Bickers, [facility] [employer] [phone] pager   [phone] cell 07/08/2014, 1:59 PM

## 2014-07-08 NOTE — Progress Notes (Signed)
Hypoglycemic Event  CBG: 65  Treatment: 15 GM carbohydrate snack  Symptoms: None  Follow-up CBG: Time: 1848 CBG Result: 83  Possible Reasons for Event: Inadequate meal intake   [name]  Remember to initiate Hypoglycemia Order Set & complete

## 2014-07-08 NOTE — Progress Notes (Signed)
Patient has heparin running through the PICC line. Phlebotomist unable to draw blood for the heparin level. Will send a second person to try.

## 2014-07-08 NOTE — Progress Notes (Signed)
ANTICOAGULATION CONSULT NOTE - Follow Up Consult  Pharmacy Consult for Heparin  Indication: DVT  No Known Allergies  Patient Measurements: Height: 5\' 8"  (172.7 cm) Weight: 233 lb 0.4 oz (105.7 kg) IBW/kg (Calculated) : 68.4  Vital Signs: Temp: 97.9 F (36.6 C) (06/12 0816) Temp Source: Oral (06/12 0816) BP: 118/84 mmHg (06/12 0800) Pulse Rate: 91 (06/12 0729)  Labs:  Recent Labs  07/06/14 1440 07/06/14 2003  07/07/14 0202 07/07/14 0400 07/07/14 1113 07/08/14 0243 07/08/14 0400  HGB 8.9*  --   --   --  8.8*  --   --  8.4*  HCT 28.5*  --   --   --  28.3*  --   --  26.3*  PLT 256  --   --   --  286  --   --  262  HEPARINUNFRC  --   --   < >  --  0.45 0.50 0.70  --   CREATININE 1.46*  --   --   --  2.22*  --   --  2.18*  TROPONINI 0.06* 0.07*  --  0.08*  --   --   --   --   < > = values in this interval not displayed.  Estimated Creatinine Clearance: 38.7 mL/min (by C-G formula based on Cr of 2.18).   Assessment: 69 yo male admitted with SOB, AV insufficiency. Scheduled for AVR 5/26 but developed recurrent melena, s/p EGD with clipping of gastric erosion 5/26. Course complicated by DVT. On 6/7 taken for multiple tooth extractions, and anticoagulation was held. Now s/p VT arrest. Spoke to Dr Halford Chessman, he and cardiologist feel that anticoagulation is necessary and the benefit outweighs the risk of bleeding.  Heparin level currently above lower goal range on 1650 units/hr.  No bleeding or complications noted.  Goal of Therapy:  Heparin level 0.3-0.5 units/ml Monitor platelets by anticoagulation protocol: Yes   Plan:  -Decrease IV heparin to 1500 units/hr -Recheck heparin level in 6 hrs. (drawing levels from foot stick since heparin infusing in central line) -Daily CBC/HL -Monitor for bleeding  Uvaldo Rising, BCPS  Clinical Pharmacist Pager [phone]  07/08/2014 10:13 AM

## 2014-07-08 NOTE — Progress Notes (Signed)
Patient ID: [name], male   DOB: 1945-12-26, 69 y.o.   MRN: [id number]    Subjective:  Intubated/sedated  Spoke with daughter   Objective:  Filed Vitals:   07/08/14 0800 07/08/14 0816 07/08/14 0900 07/08/14 1000  BP: 118/84  119/75 123/76  Pulse:   86   Temp:  97.9 F (36.6 C)    TempSrc:  Oral    Resp: 13  14 17   Height:      Weight:      SpO2:   100%     Intake/Output from previous day:  Intake/Output Summary (Last 24 hours) at 07/08/14 1043 Last data filed at 07/08/14 1000  Gross per 24 hour  Intake 1705.51 ml  Output    940 ml  Net 765.51 ml    Physical Exam: Intubated  Chronically ill black male  HEENT: normal Neck supple with no adenopathy JVP normal no bruits no thyromegaly Lungs clear with no wheezing and good diaphragmatic motion Heart:  S1/S2click SEM and loud AR  murmur, no rub, gallop or click PMI normal Abdomen: benighn, BS positve, no tenderness, no AAA no bruit.  No HSM or HJR Distal pulses intact with no bruits No edema Neuro non-focal Skin warm and dry No muscular weakness   Lab Results: Basic Metabolic Panel:  Recent Labs  07/07/14 0400 07/08/14 0400  NA 136 134*  K 4.7 4.1  CL 106 104  CO2 22 23  GLUCOSE 90 113*  BUN 18 22*  CREATININE 2.22* 2.18*  CALCIUM 8.9 8.6*  MG 2.0 2.0  PHOS  --  4.2   Liver Function Tests:  Recent Labs  07/06/14 1440 07/07/14 0400  AST 27 24  ALT 14* 14*  ALKPHOS 47 44  BILITOT 1.0 0.7  PROT 6.7 6.1*  ALBUMIN 2.7* 2.6*  CBC:  Recent Labs  07/07/14 0400 07/08/14 0400  WBC 9.9 8.6  HGB 8.8* 8.4*  HCT 28.3* 26.3*  MCV 89.3 87.4  PLT 286 262   Cardiac Enzymes:  Recent Labs  07/06/14 1440 07/06/14 2003 07/07/14 0202  TROPONINI 0.06* 0.07* 0.08*    Imaging: Dg Chest Port 1 View  07/08/2014   CLINICAL DATA:  69 year old male with respiratory failure  EXAM: PORTABLE CHEST - 1 VIEW  COMPARISON:  Prior chest x-ray 07/07/2014  FINDINGS: The endotracheal tube tip is 4 cm  above the carina. A right upper extremity PICC is present. The catheter tip projects over the superior cavoatrial junction. A nasogastric tube is present. The tip of the tube lies below the diaphragm off the field of view but likely within the stomach. Stable cardiomegaly and aneurysmal dilatation of the aorta. No pneumothorax. Small right layering pleural effusion again noted. Overall, improved inspiratory volumes with decreased pulmonary vascular congestion and decreasing bibasilar atelectasis.  IMPRESSION: 1. Improved inspiratory volumes with decreasing pulmonary vascular congestion and improving bibasilar atelectasis. 2. Stable and satisfactory support apparatus. 3. Stable cardiomegaly and descending thoracic aortic aneurysm.   Electronically Signed   By: Jacqulynn Cadet [name]   On: 07/08/2014 08:36   Dg Chest Port 1 View  07/07/2014   CLINICAL DATA:  Respiratory failure  EXAM: PORTABLE CHEST - 1 VIEW  COMPARISON:  Six hundred ten  FINDINGS: Endotracheal tube is 4.2 cm above the carina. The nasogastric tube extends into the stomach. There is a satisfactorily positioned right upper extremity PICC line with tip in the SVC. There is no pneumothorax. There is improvement, with partial clearance of basilar opacities and with better  definition of diaphragmatic contours. Moderate opacity persists in the left base.  IMPRESSION: Support equipment appears satisfactorily positioned.  Improved, with partial clearance of basilar opacities.   Electronically Signed   By: Andreas Newport [name]   On: 07/07/2014 06:37   Dg Chest Port 1 View  07/06/2014   CLINICAL DATA:  Respiratory failure.  EXAM: PORTABLE CHEST - 1 VIEW  COMPARISON:  06/24/2014 .  FINDINGS: Endotracheal tube noted with its tip 1.1 cm above the carina. Right PICC line noted with tip at the cavoatrial junction. Right IJ line in stable position with tip projected over the cavoatrial junction. Cardiomegaly normal pulmonary vascularity. Bibasilar atelectasis  and/or infiltrates with small bilateral pleural effusions. No pneumothorax .  IMPRESSION: 1. Interim placement of endotracheal tube, its tip is 1.1 cm above the carina. Proximal repositioning of approximately 2 cm should be considered. 2. Right IJ line and right PICC line in good anatomic position. 3. Stable cardiomegaly. 4. Bibasilar atelectasis and/or infiltrates with possible small bilateral pleural effusions. Critical Value/emergent results were called by telephone at the time of interpretation on 07/06/2014 at 3:59 pm to nurse Amy , who verbally acknowledged these results.   Electronically Signed   By: Marcello Moores  Register   On: 07/06/2014 16:06   Dg Abd Portable 1v  07/06/2014   CLINICAL DATA:  Nasogastric catheter placement  EXAM: PORTABLE ABDOMEN - 1 VIEW  COMPARISON:  None.  FINDINGS: Scattered large and small bowel gas is noted. A nasogastric catheter is noted within the distal aspect of the stomach in satisfactory position.  IMPRESSION: Nasogastric catheter in satisfactory position.   Electronically Signed   By: Inez Catalina [name]   On: 07/06/2014 19:43    Cardiac Studies:  ECG:  afib low voltage old IMI    Telemetry: Afib  no further VT   Echo:   Medications:   . antiseptic oral rinse  7 mL Mouth Rinse QID  . chlorhexidine  15 mL Mouth Rinse BID  . feeding supplement (PRO-STAT SUGAR FREE 64)  30 mL Per Tube Daily  . feeding supplement (VITAL HIGH PROTEIN)  1,000 mL Per Tube Q24H  . fentaNYL (SUBLIMAZE) injection  50 mcg Intravenous Once  . ferrous sulfate  325 mg Oral Q breakfast  . pantoprazole  40 mg Intravenous Q12H  . piperacillin-tazobactam (ZOSYN)  IV  3.375 g Intravenous 3 times per day  . sodium chloride  10-40 mL Intracatheter Q12H  . sodium chloride  3 mL Intravenous Q12H  . sodium chloride  3 mL Intravenous Q12H  . vancomycin  1,250 mg Intravenous Q24H     . fentaNYL infusion INTRAVENOUS Stopped (07/07/14 0840)  . heparin 1,500 Units/hr (07/08/14 1012)     Assessment/Plan:  VT Arrest:  Likely low output from failing AVR or ischemic from tortuous RCA limb of [place] Graft.  Dr Prescott Gum aware  IV amiodarone Off can resume for any NSVT  CAD:  Native arteries ok but distal RCA not well seen at cath from [facility] Graft.  Will need reimplantation of cors at time of AVR/root replacement Issues with true/false lumens in ascending root may also be playing a rold  AVR:  Long standing non compliance with anticoagution.  One leaflet non functioning and other limited motion leading to AS and AR.  Repeat Surgery when possible  Back on heparin after dental procedure.  Difficult timing of redo AVR given current comorbidities  Jenkins Rouge 07/08/2014, 10:43 AM

## 2014-07-08 NOTE — Progress Notes (Signed)
Talked to Mickel Baas with speech. SLP will see patient tomorrow.

## 2014-07-08 NOTE — Progress Notes (Signed)
PULMONARY / CRITICAL CARE MEDICINE   Name: [name] MRN: [id number] DOB: 1946/01/21    ADMISSION DATE:  07/14/2014 CONSULTATION DATE:  07/06/14  REFERRING MD :  Dr. Thereasa Solo   CHIEF COMPLAINT:  Cardiac Arrest   INITIAL PRESENTATION: 69 y/o M initially admitted to [facility] on 5/19 with SOB in the setting of AV insufficiency after developing a partially thrombosed mechanical AV after stopping coumadin for unclear reasons.  He was scheduled for an AVR on 5/26 but developed recurrent melena.  He underwent an EGD with clipping of gastric erosion on 5/26.  Course was complicated by DVT of the LUE, anemia, dental caries and deconditioning of critical illness.  He was transferred to [place] on 6/3 for planned rehab prior to AVR.  6/7 the patient was taken for multiple tooth extraction.  6/10 afternoon, he suffered a VT arrest requiring ~ 2 min of CPR.  He was intubated post CPR and PCCM consulted for evaluation.    STUDIES:    SIGNIFICANT EVENTS: 5/19-6/03  Admit to [facility] for cardiogenic shock secondary to AV insufficiency  5/20  Cardiac Cath >> non-obstructive disease, 10% stenosis in LAD & L circumflex  6/03-6/09  Admit to [facility] CIR for rehab.  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 [name] who initially presented to [facility] on 06/13/2014 complaining of SOB. While at [facility], he was found to have severe aortic insufficiency and transferred to [facility] for further evaluation.    Work up was consistent with a partially thrombosed mechanical valve after coumadin was stopped for unclear reasons (coumadin was stopped 3 months prior to 5/19 admit).  He was treated  with intravenous heparin. TEE completed showing ejection fraction of 60%. Only one of the leaflets could be visualized as moving movement was significantly reduced. There was severe aortic insufficiency. 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 Cardiac catheterization showed no significant CAD. Cardiothoracic surgery consulted for prosthetic valve dysfunction and need for surgical intervention. He was planned for AVR revision after rehab.  Hospital course complicated by GI bleed/melena with gastroenterology consulted 06/17/2014. EGD completed 06/18/2014 showing 2 gastric erosions without active bleeding. Patient required transfusion during hospital course. On 06/21/2014 with increased loose tarry stools EGD was repeated showing large gastric blood clot with erosion visible inferior to the GE junction. Hospital course was further complicated by an acute DVT in the left brachial and axillary veins (dx'd 06/24/2014).   He was transferred to [facility] on 6/3 for rehab efforts prior to redo aortic root replacement.  He was seen by Dr. Enrique Sack on 6/7 for dental carries and underwent extraction on 6/9 of multiple teeth (tooth numbers 3, 4, 5, 13, 14, 21, 23, 24, 25, and 26).  He was discharged from CIR back to [facility] post-operatively for close observation.  Anticoagulation was held for procedure and through 6/10.  Notes reflected planned AVR for 7-10 days post extraction.  On 6/10 early afternoon, he was noted to have a wide complex / VT arrest requiring  approximately 2 minutes of CPR.       SUBJECTIVE:   VITAL SIGNS: Temp:  [97.9 F (36.6 C)-98.9 F (37.2 C)] 97.9 F (36.6 C) (06/12 0816) Pulse Rate:  [76-103] 91 (06/12 0729) Resp:  [13-24] 13 (06/12 0800) BP: (118-160)/(74-109) 118/84 mmHg (06/12 0800) SpO2:  [100 %] 100 % (06/12 0729) FiO2 (%):  [40 %] 40 % (06/12 0729) Weight:  [233 lb 0.4 oz (105.7 kg)] 233 lb 0.4  oz (105.7 kg) (06/12 0500)   HEMODYNAMICS:     VENTILATOR SETTINGS: Vent Mode:  [-] PSV FiO2 (%):  [40 %] 40 % Set Rate:  [14 bmp] 14 bmp Vt Set:  [550 mL] 550 mL PEEP:  [5 cmH20] 5 cmH20 Pressure Support:  [5 cmH20-8 cmH20] 5 cmH20 Plateau Pressure:  [16 cmH20-20 cmH20] 16 cmH20   INTAKE / OUTPUT:  Intake/Output Summary (Last 24 hours) at 07/08/14 0902 Last data filed at 07/08/14 0800  Gross per 24 hour  Intake 1719.01 ml  Output    780 ml  Net 939.01 ml    PHYSICAL EXAMINATION: General:  Chronically ill adult male in NAD on vent  Neuro:  Arouses to voice, awake/ answers appropriately  HEENT:  MM pink/moist, no jvd Cardiovascular:  s1s2 irr irr, rate in 90's, mechanical valve click, 6-6/0 murmur heard best 2nd ICS RSB Lungs:  resp's even/non-labored, lungs bilaterally coarse Abdomen:  Obese/soft, NTND, hernia / soft Musculoskeletal:  No acute deformities  Skin:  Warm/dry, BUE 1-2+ edema, trace lower  LABS:  CBC  Recent Labs Lab 07/06/14 1440 07/07/14 0400 07/08/14 0400  WBC 9.7 9.9 8.6  HGB 8.9* 8.8* 8.4*  HCT 28.5* 28.3* 26.3*  PLT 256 286 262   Coag's No results for input(s): APTT, INR in the last 168 hours.   BMET  Recent Labs Lab 07/06/14 1440 07/07/14 0400 07/08/14 0400  NA 133* 136 134*  K 4.7 4.7 4.1  CL 103 106 104  CO2 21* 22 23  BUN 12 18 22*  CREATININE 1.46* 2.22* 2.18*  GLUCOSE 128* 90 113*   Electrolytes  Recent Labs Lab 07/06/14 1440 07/07/14 0400 07/08/14 0400  CALCIUM 8.8* 8.9 8.6*  MG 1.8 2.0 2.0  PHOS  --   --  4.2   Sepsis Markers No results for input(s): LATICACIDVEN, PROCALCITON, O2SATVEN in the last 168 hours.   ABG  Recent Labs Lab 07/06/14 1420 07/07/14 1115 07/08/14 0815  PHART 7.316* 7.340* 7.371  PCO2ART 40.0 38.1 38.1  PO2ART 182* 142* 114*     Liver Enzymes  Recent Labs Lab 07/06/14 0519 07/06/14 1440 07/07/14 0400  AST 21 27 24   ALT 12* 14* 14*  ALKPHOS 47 47 44  BILITOT 0.7 1.0 0.7   ALBUMIN 2.6* 2.7* 2.6*   Cardiac Enzymes  Recent Labs Lab 07/06/14 1440 07/06/14 2003 07/07/14 0202  TROPONINI 0.06* 0.07* 0.08*     Glucose  Recent Labs Lab 07/07/14 1818 07/07/14 1848 07/07/14 1957 07/07/14 2345 07/08/14 0352 07/08/14 0814  GLUCAP 65 83 114* 110* 108* 115*    Imaging Dg Chest Port 1 View  07/08/2014   CLINICAL DATA:  69 year old male with respiratory failure  EXAM: PORTABLE CHEST - 1 VIEW  COMPARISON:  Prior chest x-ray 07/07/2014  FINDINGS: The endotracheal tube tip is 4 cm above the carina. A right upper extremity PICC is present. The catheter tip projects over the superior cavoatrial junction. A nasogastric tube is present. The tip of the tube lies below the diaphragm off the field  of view but likely within the stomach. Stable cardiomegaly and aneurysmal dilatation of the aorta. No pneumothorax. Small right layering pleural effusion again noted. Overall, improved inspiratory volumes with decreased pulmonary vascular congestion and decreasing bibasilar atelectasis.  IMPRESSION: 1. Improved inspiratory volumes with decreasing pulmonary vascular congestion and improving bibasilar atelectasis. 2. Stable and satisfactory support apparatus. 3. Stable cardiomegaly and descending thoracic aortic aneurysm.   Electronically Signed   By: Jacqulynn Cadet [name]   On: 07/08/2014 08:36   ASSESSMENT / PLAN:  PULMONARY OETT A: Acute Respiratory Failure - in setting of cardiac arrest P:   MV support, 8 cc/kg Wean PEEP / FiO2 for sats > 92% Trend CXR  VAP prevention measures  6/11 will push weaning. He will need AVR redo soon. Valvular issues may impede weaning along with renal insuffiencey.  6/12 abgs ok, may be weanable but his cardiac issues will hamper progress.  CARDIOVASCULAR CVL R IJ >> d/c'd 6/10 RUE PICC 6/10 >>  A:  VT Cardiac Arrest - suspected ischemic / low output state as cause of cardiac arrest  AV Insufficiency s/p AVR with need for  revision Atrial Fibrillation  Hx of HTN, AVR P:  Tele monitoring  Assess troponin, EKG  Begin heparin gtt with concern for thrombosed valve (discussed with Dr. Johnsie Cancel) Amiodarone per Cardiology Cardiology consulted, appreciate input  RENAL Lab Results  Component Value Date   CREATININE 2.18* 07/08/2014   CREATININE 2.22* 07/07/2014   CREATININE 1.46* 07/06/2014    A:    AKI - s/p cardiac arrest P:   Trend BMP / UOP  Replace electrolytes as indicated   GASTROINTESTINAL A:   Recent GIB / Gastric Erosion s/p Clipping  Protein Calorie Malnutrition Hyponatremia   P:   PPI  NPO OGT, consider TF in am 6/11 if remains intubated , pushing wean 6/11 PRN zofran  Monitor for bleeding with anticoagulation and recent bleed   HEMATOLOGIC A:   Anemia - s/p transfusion 6/9 DVT LUE  P:  Trend CBC SCD's for DVT prophylaxis   INFECTIOUS A:  Dental Carries s/p Extraction - 6/9, 10 teeth removed P:   BCx2 6/10 >>  No abx Trend fever curve / leukocytosis  ENDOCRINE CBG (last 3)   Recent Labs  07/07/14 2345 07/08/14 0352 07/08/14 0814  GLUCAP 110* 108* 115*     A:   Mild Hyperglycemia   P:   Monitor glucose on BMP  NEUROLOGIC A:   Pain - post CPR / dental extraction.  Mental status appears to be at baseline after arrest  P:   RASS goal:  -1 PRN versed / Fentanyl Serial neuro exams  FAMILY  - Updates: Family updated at bedside.   - Inter-disciplinary family meet or Palliative Care meeting due by:  6/17  Richardson Landry Minor ACNP Maryanna Shape PCCM Pager [phone] till 3 pm If no answer page [phone] 07/08/2014, 77:55 AM  69 year old male with extensive PMH, intubated post cardiac arrest.  Follows all commands this AM and able to take in 650 ml Tv on 0/5.  Will extubate.  Only concern is that he is not lifting his head of the pillow effectively.  Spoke with wife, will trial extubate then re-evaluate needs.  OOB to chair, PT ordered, titration of O2, IS and flutter valve  ordered.  The patient is critically ill with multiple organ systems failure and requires high complexity decision making for assessment and support, frequent evaluation and titration of therapies, application of advanced monitoring technologies and  extensive interpretation of multiple databases.   Critical Care Time devoted to patient care services described in this note is  35  Minutes. This time reflects time of care of this signee Dr Jennet Maduro. This critical care time does not reflect procedure time, or teaching time or supervisory time of PA/NP/Med student/Med Resident etc but could involve care discussion time.  Rush Farmer, [name] [facility] Pulmonary/Critical Care Medicine. Pager: [phone]. After hours pager: [phone].

## 2014-07-09 DIAGNOSIS — D62 Acute posthemorrhagic anemia: Secondary | ICD-10-CM

## 2014-07-09 DIAGNOSIS — B952 Enterococcus as the cause of diseases classified elsewhere: Secondary | ICD-10-CM

## 2014-07-09 DIAGNOSIS — Z959 Presence of cardiac and vascular implant and graft, unspecified: Secondary | ICD-10-CM

## 2014-07-09 DIAGNOSIS — N289 Disorder of kidney and ureter, unspecified: Secondary | ICD-10-CM

## 2014-07-09 DIAGNOSIS — R319 Hematuria, unspecified: Secondary | ICD-10-CM

## 2014-07-09 DIAGNOSIS — I482 Chronic atrial fibrillation: Secondary | ICD-10-CM

## 2014-07-09 DIAGNOSIS — Z98818 Other dental procedure status: Secondary | ICD-10-CM

## 2014-07-09 DIAGNOSIS — Z960 Presence of urogenital implants: Secondary | ICD-10-CM

## 2014-07-09 DIAGNOSIS — I350 Nonrheumatic aortic (valve) stenosis: Secondary | ICD-10-CM

## 2014-07-09 DIAGNOSIS — R7881 Bacteremia: Secondary | ICD-10-CM

## 2014-07-09 DIAGNOSIS — I351 Nonrheumatic aortic (valve) insufficiency: Secondary | ICD-10-CM | POA: Diagnosis present

## 2014-07-09 DIAGNOSIS — Z01818 Encounter for other preprocedural examination: Secondary | ICD-10-CM

## 2014-07-09 LAB — CULTURE, BLOOD (ROUTINE X 2)

## 2014-07-09 LAB — HEPARIN LEVEL (UNFRACTIONATED)
HEPARIN UNFRACTIONATED: 0.51 [IU]/mL (ref 0.30–0.70)
Heparin Unfractionated: 0.66 IU/mL (ref 0.30–0.70)

## 2014-07-09 LAB — CBC
HEMATOCRIT: 27.1 % — AB (ref 39.0–52.0)
Hemoglobin: 8.6 g/dL — ABNORMAL LOW (ref 13.0–17.0)
MCH: 27.7 pg (ref 26.0–34.0)
MCHC: 31.7 g/dL (ref 30.0–36.0)
MCV: 87.4 fL (ref 78.0–100.0)
Platelets: 242 10*3/uL (ref 150–400)
RBC: 3.1 MIL/uL — AB (ref 4.22–5.81)
RDW: 17.6 % — ABNORMAL HIGH (ref 11.5–15.5)
WBC: 7.7 10*3/uL (ref 4.0–10.5)

## 2014-07-09 LAB — TYPE AND SCREEN
ABO/RH(D): A POS
ANTIBODY SCREEN: NEGATIVE

## 2014-07-09 LAB — BLOOD PRODUCT ORDER (VERBAL) VERIFICATION

## 2014-07-09 LAB — GLUCOSE, CAPILLARY
GLUCOSE-CAPILLARY: 83 mg/dL (ref 65–99)
Glucose-Capillary: 68 mg/dL (ref 65–99)
Glucose-Capillary: 81 mg/dL (ref 65–99)
Glucose-Capillary: 83 mg/dL (ref 65–99)
Glucose-Capillary: 99 mg/dL (ref 65–99)

## 2014-07-09 MED ORDER — PANTOPRAZOLE SODIUM 40 MG PO TBEC: 40.0000 mg | DELAYED_RELEASE_TABLET | Freq: Every day | ORAL | Status: DC

## 2014-07-09 MED ORDER — CEFTRIAXONE SODIUM IN DEXTROSE 40 MG/ML IV SOLN
2.0000 g | Freq: Two times a day (BID) | INTRAVENOUS | Status: DC
  Administered 2014-07-09 – 2014-07-11 (×4): 2 g via INTRAVENOUS
  Filled 2014-07-09 (×6): qty 50

## 2014-07-09 MED ORDER — AMINOCAPROIC ACID SOLUTION 5% (50 MG/ML)
10.0000 mL | ORAL | Status: DC | PRN
  Filled 2014-07-09: qty 100

## 2014-07-09 MED ORDER — METOPROLOL TARTRATE 25 MG PO TABS
25.0000 mg | ORAL_TABLET | Freq: Two times a day (BID) | ORAL | Status: DC
  Administered 2014-07-09 – 2014-07-10 (×2): 25 mg via ORAL
  Filled 2014-07-09 (×3): qty 1

## 2014-07-09 MED ORDER — AMINOCAPROIC ACID SOLUTION 5% (50 MG/ML)
10.0000 mL | ORAL | Status: AC
  Administered 2014-07-09 (×10): 10 mL via ORAL
  Filled 2014-07-09: qty 100

## 2014-07-09 MED ORDER — METOPROLOL TARTRATE 12.5 MG HALF TABLET
12.5000 mg | ORAL_TABLET | Freq: Once | ORAL | Status: AC
  Administered 2014-07-09: 12.5 mg via ORAL
  Filled 2014-07-09: qty 1

## 2014-07-09 MED ORDER — FENTANYL CITRATE (PF) 100 MCG/2ML IJ SOLN: 25.0000 ug | INTRAMUSCULAR | Status: DC | PRN

## 2014-07-09 MED ORDER — METOPROLOL TARTRATE 12.5 MG HALF TABLET
12.5000 mg | ORAL_TABLET | Freq: Two times a day (BID) | ORAL | Status: DC
  Administered 2014-07-09: 12.5 mg via ORAL
  Filled 2014-07-09 (×2): qty 1

## 2014-07-09 NOTE — Progress Notes (Addendum)
No distress No new complaints Has gauze in mouth to stanch bleeding from gums  Filed Vitals:   07/09/14 1100 07/09/14 1147 07/09/14 1200 07/09/14 1300  BP: 147/96  147/92 165/95  Pulse: 86  93 95  Temp:  97.5 F (36.4 C)    TempSrc:  Oral    Resp: 21  21 19   Height:      Weight:      SpO2: 100%  100% 98%    NAD on RA Chest clear IRIR, rate controlled NABS Ext warm without edema  BMET    Component Value Date/Time   NA 134* 07/08/2014 0400   K 4.1 07/08/2014 0400   CL 104 07/08/2014 0400   CO2 23 07/08/2014 0400   GLUCOSE 113* 07/08/2014 0400   BUN 22* 07/08/2014 0400   CREATININE 2.18* 07/08/2014 0400   CALCIUM 8.6* 07/08/2014 0400   GFRNONAA 30* 07/08/2014 0400   GFRAA 34* 07/08/2014 0400    CBC    Component Value Date/Time   WBC 7.7 07/09/2014 0500   RBC 3.10* 07/09/2014 0500   HGB 8.6* 07/09/2014 0500   HGB 7.4 11/10/2012   HCT 27.1* 07/09/2014 0500   HCT 24 11/10/2012   PLT 242 07/09/2014 0500   MCV 87.4 07/09/2014 0500   MCH 27.7 07/09/2014 0500   MCHC 31.7 07/09/2014 0500   RDW 17.6* 07/09/2014 0500   LYMPHSABS 1.5 07/02/2014 0425   MONOABS 1.2* 07/02/2014 0425   EOSABS 0.3 07/02/2014 0425   BASOSABS 0.0 07/02/2014 0425    No new CXR  IMPRESSION: Resp failure after ardiac arrest - resolved VT arrest CAF S/P AVR AKI post arrest, nonoliguric  Recent UGIB  Protein-calorie malnutrition Enterococcal bacteremia LUE DVT - newly diagnosed (06/21/14) Acute blood loss anemia s/p extraction of 10 teeth Gingival bleeding Full heparin  - mechanical heart valve  PLAN/REC: Cont current Rx Transfer to SDU 6/13 TRH to resume care as of AM 6/14 - discussed with Dr Thereasa Solo Cards/TCTS following Oral surgery following  Full iquids OK with Dr Enrique Sack Cont full dose heparin ID following and managing all abx   Merton Border, MD ; [facility] service Mobile [phone].  After 5:30 PM or weekends, call [phone]

## 2014-07-09 NOTE — Progress Notes (Signed)
POST OPERATIVE NOTE: Post op day #4  07/09/2014   [name] [id number]  VITALS: BP 152/89 mmHg  Pulse 91  Temp(Src) 97.9 F (36.6 C) (Axillary)  Resp 13  Ht 5\' 8"  (1.727 m)  Wt 227 lb 15.3 oz (103.4 kg)  BMI 34.67 kg/m2  SpO2 100%  LABS:  Lab Results  Component Value Date   WBC 7.7 07/09/2014   HGB 8.6* 07/09/2014   HCT 27.1* 07/09/2014   MCV 87.4 07/09/2014   PLT 242 07/09/2014   BMET    Component Value Date/Time   NA 134* 07/08/2014 0400   K 4.1 07/08/2014 0400   CL 104 07/08/2014 0400   CO2 23 07/08/2014 0400   GLUCOSE 113* 07/08/2014 0400   BUN 22* 07/08/2014 0400   CREATININE 2.18* 07/08/2014 0400   CALCIUM 8.6* 07/08/2014 0400   GFRNONAA 30* 07/08/2014 0400   GFRAA 34* 07/08/2014 0400    Lab Results  Component Value Date   INR 1.37 06/21/2014   INR 1.67* 06/15/2014   No results [name] for: PTT   [name] is status post multiple dental extractions with alveoloplasty and gross debridement of remaining dentition in the operating room on Thursday, July 05, 2014. Patient was admitted to the stepdown unit from PACU after the dental extraction procedures at the request of the anesthesiology team. Admission was coordinated with hospitalist team thru Dr. Tharon Aquas Trigt. Patient was seen on postop day 1 with minimal complaints and no sign of infection, heme, or ooze. The patient was seen postoperatively by Dr. Tharon Aquas Trigt with plan for future redo of the aortic valve replacement. The patient subsequently developed a ventricular tachycardia cardiac arrest and was intubated and then subsequently placed on heparin IV therapy. The patient was extubated recently. The patient is now seen for follow-up evaluation of bleeding from dental extraction sites.  SUBJECTIVE: Patient indicates that he is doing" fair". Patient denies having any significant oral discomfort. The nurse in the room indicates that he has not requested pain medication recently. The  patient currently is not using his Amicar rinses.  EXAM: Patient is oozing from his dental extraction sites. Patient is using bedside suction with a straw. Sutures are still intact. Some bruising and swelling are still noted.  ASSESSMENT: Post operative course is consistent with dental procedures performed in the operating room and current Heparin IV therapy. Postoperative bleeding from dental extraction sites secondary to restart of Heparin IV therapy.  PLAN: 1. Amicar 5% oral rinse. Patient is rinse with 10 ML's every hour for the next 10 hours and then as needed for persistent oozing. Patient is to use in a swish and spit manner. Do not swallow.  2. Use a series of 4 x 4 gauzes with pressure to extraction sites to maintain hemostasis.  3. Use of bedside suction by the patient will increase bleeding from dental extraction sites due to the pressure from the suction. Use of bedside suction by the patient was instructed to be discontinued. Nursing staff may use suction as needed however.  4. Will discontinue chlorhexidine rinses at this time. Patient is already on IV antibiotic therapy due to enterococcus bacteremia. We will consider use again once oral bleeding stops. 5. Per discussion with critical care team, diet may be advanced to full liquid diet at this time. May increase diet to soft mechanical diet with the aid of a nutritionist as per previous requested consultation.   [name], DDS

## 2014-07-09 NOTE — Progress Notes (Addendum)
SLP Cancellation Note  Patient Details Name: [name] MRN: [id number] DOB: [birth year]   Cancelled treatment:       Reason Eval/Treat Not Completed: Medical issues which prohibited therapy. Pt having quite a bit of bleeding from gums. RN reports he is receiving an oral rinse every hour to reduce bleeding, starting this am. Planned to return in pm, but order discontinued by MD. Will sign off  [name], Katherene Ponto 07/09/2014, 9:09 AM

## 2014-07-09 NOTE — Progress Notes (Signed)
eLink Physician-Brief Progress Note Patient Name: KENNETT SYMES DOB: 1945/10/15 MRN: [id number]   Date of Service  07/09/2014  HPI/Events of Note  Hypertension. BP = 170/100 and HR in low 100's.  eICU Interventions  Will give an extra dose of Metoprolol 12.5 mg PO now and increase Metoprolol to 25 mg PO BID.     Intervention Category Intermediate Interventions: Hypertension - evaluation and management  Antoninette Lerner Eugene 07/09/2014, 5:57 PM

## 2014-07-09 NOTE — Progress Notes (Signed)
I will follow pt's progress to assist with planning rehab venue options after surgery per Dr. Prescott Gum. [phone]

## 2014-07-09 NOTE — Progress Notes (Signed)
Notified Elink about increased BP 170s/100s. Waiting for new orders to be placed. Will continue to assess and monitor the pt closely.

## 2014-07-09 NOTE — Progress Notes (Signed)
Patient Name: [name] Date of Encounter: 07/09/2014  Primary Cardiologist: Dr. Meda Coffee - saw [facility] in the past   Principal Problem:   Positive blood cultures Active Problems:   Aortic valve insufficiency   Hx of aortic aneurysm repair   Atrial fibrillation, do not know length of time   Gastric erosion   Acute respiratory failure, unspecified whether with hypoxia or hypercapnia   Anemia   Ventricular tachycardia   Essential hypertension   H/O aortic valve replacement   Acute renal insufficiency    SUBJECTIVE  Denies any CP or SOB.   CURRENT MEDS . aminocaproic acid  10 mL Oral Q1H  . ampicillin (OMNIPEN) IV  2 g Intravenous 3 times per day  . cefTRIAXone (ROCEPHIN)  IV  2 g Intravenous Q24H  . ferrous sulfate  325 mg Oral Q breakfast  . [START ON 07/10/2014] pantoprazole  40 mg Oral Q1200  . sodium chloride  10-40 mL Intracatheter Q12H    OBJECTIVE  Filed Vitals:   07/09/14 0900 07/09/14 1000 07/09/14 1100 07/09/14 1147  BP: 143/91 144/101 147/96   Pulse: 90 88 86   Temp:    97.5 F (36.4 C)  TempSrc:    Oral  Resp: 19 18 21    Height:      Weight:      SpO2: 100% 100% 100%     Intake/Output Summary (Last 24 hours) at 07/09/14 1311 Last data filed at 07/09/14 1200  Gross per 24 hour  Intake  557.7 ml  Output    940 ml  Net -382.3 ml   Filed Weights   07/07/14 0352 07/08/14 0500 07/09/14 0459  Weight: 231 lb 4.2 oz (104.9 kg) 233 lb 0.4 oz (105.7 kg) 227 lb 15.3 oz (103.4 kg)    PHYSICAL EXAM  General: Pleasant, NAD. Neuro: Alert, follow command, recognize place and self, however slow to answer questions.  Moves all extremities spontaneously. Psych: Normal affect. HEENT:  Normal  Neck: Supple without bruits or JVD. Lungs:  Resp regular and unlabored, anterior exam CTA. Heart: irregular. no s3, s4, or murmurs. Abdomen: Soft, non-tender, non-distended, BS + x 4.  Extremities: No clubbing, cyanosis or edema. DP/PT/Radials 2+ and equal  bilaterally.  Accessory Clinical Findings  CBC  Recent Labs  07/08/14 0400 07/09/14 0500  WBC 8.6 7.7  HGB 8.4* 8.6*  HCT 26.3* 27.1*  MCV 87.4 87.4  PLT 262 096   Basic Metabolic Panel  Recent Labs  07/07/14 0400 07/08/14 0400  NA 136 134*  K 4.7 4.1  CL 106 104  CO2 22 23  GLUCOSE 90 113*  BUN 18 22*  CREATININE 2.22* 2.18*  CALCIUM 8.9 8.6*  MG 2.0 2.0  PHOS  --  4.2   Liver Function Tests  Recent Labs  07/06/14 1440 07/07/14 0400  AST 27 24  ALT 14* 14*  ALKPHOS 47 44  BILITOT 1.0 0.7  PROT 6.7 6.1*  ALBUMIN 2.7* 2.6*   Cardiac Enzymes  Recent Labs  07/06/14 1440 07/06/14 2003 07/07/14 0202  TROPONINI 0.06* 0.07* 0.08*    TELE A-fib with HR 70-80s, single episode of 6-7 beats run of NSVT, occasional PVCs    ECG  No new EKG  Echocardiogram 06/15/2014  LV EF: 60% -  65%  ------------------------------------------------------------------- Indications:   Aortic insufficiency 424.1.  ------------------------------------------------------------------- History:  PMH:  Chest pain. Atrial fibrillation. Congestive heart failure. Aortic valve disease, post repair.  ------------------------------------------------------------------- Study Conclusions  - Left ventricle: The cavity size  was normal. There was moderate concentric hypertrophy. Systolic function was normal. The estimated ejection fraction was in the range of 60% to 65%. Wall motion was normal; there were no regional wall motion abnormalities. - Aortic valve: A 25 mm St Jude mechanical conduit valve is present at the aortic position. There are imited views and it seems that only one leaflet is opening. Transaortic gradients are severeley elevated. There is moderate to severe aortic insufficiency, no paravalvular leak. There was severe stenosis. There was moderate to severe regurgitation. Mean gradient (S): 54 mm Hg. Peak gradient (S): 88 mm Hg.  Valve area (VTI): 0.42 cm^2. Valve area (Vmax): 0.36 cm^2. Valve area (Vmean): 0.37 cm^2. - Ascending aorta: The ascending aorta was normal in size. - Mitral valve: There was moderate regurgitation. - Left atrium: The atrium was severely dilated. - Right atrium: The atrium was moderately dilated. - Pulmonary arteries: Systolic pressure was moderately increased. PA peak pressure: 48 mm Hg (S).  Impressions:  - A 25 mm St Jude mechanical conduit valve is present at the aortic position. There are imited views and it seems that only one leaflet is opening. Transaortic gradients are severeley elevated. There is moderate to severe aortic insufficiency, no paravalvular leak.  LV size and function is normal. Filling pressures couldn&'t be estimated.  RV is moderately dilated with moderate systolic dysfunction and moderate pulmonary hypertension. RVSP 48 mmHg.    Radiology/Studies  Dg Orthopantogram  06/19/2014   CLINICAL DATA:  Aortic stenosis, preop  EXAM: ORTHOPANTOGRAM/PANORAMIC  COMPARISON:  None available  FINDINGS: Multiple missing teeth and restorations. Caries involving teeth 3 and 4. Periapical lucency/erosion involving tooth number 13.  IMPRESSION: 1. Dental caries and periapical disease as above.   Electronically Signed   By: Lucrezia Europe [name]   On: 06/19/2014 16:52   Dg Knee 1-2 Views Left  07/03/2014   CLINICAL DATA:  69 year old male with generalized left knee pain and swelling progressive the past few days.  EXAM: LEFT KNEE - 1-2 VIEW  COMPARISON:  None.  FINDINGS: Suspect small suprapatellar joint effusion. No evidence acute fracture or malalignment. There is narrowing of the lateral compartmental joint space with subchondral sclerosis and subtle irregularity of the cortical surface of the lateral femoral condyle and lateral tibial plateau. Mild osteophyte formation in the patellofemoral compartment. No evidence of acute fracture or malalignment. No focal soft  tissue abnormality.  IMPRESSION: 1. Small suprapatellar knee joint effusion which may be degenerative, or infectious. 2. Degenerative changes relatively focal to the lateral compartments with significant narrowing of the joint space and subtle irregularity of the cortical margin of the lateral femoral condyle and lateral tibial plateau. This is favored to represent advanced degenerative change with chondromalacia and secondary bony changes.   Electronically Signed   By: Jacqulynn Cadet [name]   On: 07/03/2014 18:43   Ct Head Wo Contrast  06/19/2014   CLINICAL DATA:  Patient with cognitive deficits and expressive aphasia.  EXAM: CT HEAD WITHOUT CONTRAST  TECHNIQUE: Contiguous axial images were obtained from the base of the skull through the vertex without intravenous contrast.  COMPARISON:  None.  FINDINGS: Ventricles and sulci are appropriate for patient's age. No evidence for acute cortically based infarct, intracranial hemorrhage, mass lesion or mass-effect. Orbits are unremarkable. Polypoid mucosal thickening bilateral maxillary sinuses. Frontal sinus and ethmoid air cells are unremarkable. Mastoid air cells are well aerated. Calvarium is intact. Zygomatic arches are unremarkable.  IMPRESSION: No acute intracranial process.  Polypoid mucosal thickening paranasal sinuses.  Electronically Signed   By: Lovey Newcomer [name]   On: 06/19/2014 15:30   Dg Chest Port 1 View  07/08/2014   CLINICAL DATA:  69 year old male with respiratory failure  EXAM: PORTABLE CHEST - 1 VIEW  COMPARISON:  Prior chest x-ray 07/07/2014  FINDINGS: The endotracheal tube tip is 4 cm above the carina. A right upper extremity PICC is present. The catheter tip projects over the superior cavoatrial junction. A nasogastric tube is present. The tip of the tube lies below the diaphragm off the field of view but likely within the stomach. Stable cardiomegaly and aneurysmal dilatation of the aorta. No pneumothorax. Small right layering pleural  effusion again noted. Overall, improved inspiratory volumes with decreased pulmonary vascular congestion and decreasing bibasilar atelectasis.  IMPRESSION: 1. Improved inspiratory volumes with decreasing pulmonary vascular congestion and improving bibasilar atelectasis. 2. Stable and satisfactory support apparatus. 3. Stable cardiomegaly and descending thoracic aortic aneurysm.   Electronically Signed   By: Jacqulynn Cadet [name]   On: 07/08/2014 08:36   Dg Chest Port 1 View  07/07/2014   CLINICAL DATA:  Respiratory failure  EXAM: PORTABLE CHEST - 1 VIEW  COMPARISON:  Six hundred ten  FINDINGS: Endotracheal tube is 4.2 cm above the carina. The nasogastric tube extends into the stomach. There is a satisfactorily positioned right upper extremity PICC line with tip in the SVC. There is no pneumothorax. There is improvement, with partial clearance of basilar opacities and with better definition of diaphragmatic contours. Moderate opacity persists in the left base.  IMPRESSION: Support equipment appears satisfactorily positioned.  Improved, with partial clearance of basilar opacities.   Electronically Signed   By: Andreas Newport [name]   On: 07/07/2014 06:37   Dg Chest Port 1 View  07/06/2014   CLINICAL DATA:  Respiratory failure.  EXAM: PORTABLE CHEST - 1 VIEW  COMPARISON:  06/24/2014 .  FINDINGS: Endotracheal tube noted with its tip 1.1 cm above the carina. Right PICC line noted with tip at the cavoatrial junction. Right IJ line in stable position with tip projected over the cavoatrial junction. Cardiomegaly normal pulmonary vascularity. Bibasilar atelectasis and/or infiltrates with small bilateral pleural effusions. No pneumothorax .  IMPRESSION: 1. Interim placement of endotracheal tube, its tip is 1.1 cm above the carina. Proximal repositioning of approximately 2 cm should be considered. 2. Right IJ line and right PICC line in good anatomic position. 3. Stable cardiomegaly. 4. Bibasilar atelectasis and/or  infiltrates with possible small bilateral pleural effusions. Critical Value/emergent results were called by telephone at the time of interpretation on 07/06/2014 at 3:59 pm to nurse Amy , who verbally acknowledged these results.   Electronically Signed   By: Eldridge   On: 07/06/2014 16:06   Dg Chest Port 1 View  06/24/2014   CLINICAL DATA:  Shortness of breath.  Subsequent exam.  EXAM: PORTABLE CHEST - 1 VIEW  COMPARISON:  06/21/2014  FINDINGS: Since the previous exam, the endotracheal tube has been removed. Right internal jugular central venous line is stable and well positioned.  Stable cardiomegaly. Minor basilar lung opacity consistent with atelectasis. No convincing pneumonia and no pulmonary edema. No pneumothorax.  Stable elevation the right hemidiaphragm.  IMPRESSION: 1. No acute cardiopulmonary disease. 2. Stable cardiomegaly. Minor lung base atelectasis. Right IJ central venous line is stable in well positioned.   Electronically Signed   By: Lajean Manes [name]   On: 06/24/2014 08:49   Dg Chest Portable 1 View  06/21/2014   CLINICAL DATA:  Status post EGD for upper GI bleed, evaluate ETT  EXAM: PORTABLE CHEST - 1 VIEW  COMPARISON:  06/16/2014  FINDINGS: Endotracheal tube terminates 4 cm above the carina.  Mild blunting of the left costophrenic angle. Mild left basilar atelectasis. Lungs otherwise clear. No pleural effusion or pneumothorax.  Cardiomegaly.  Right IJ venous catheter terminates at the cavoatrial junction.  IMPRESSION: Endotracheal tube terminates 4 cm above the carina.  Right IJ venous catheter terminates at the cavoatrial junction.  No pneumothorax.   Electronically Signed   By: Julian Hy [name]   On: 06/21/2014 16:25   Dg Chest Port 1 View  06/16/2014   CLINICAL DATA:  Central line placement.  EXAM: PORTABLE CHEST - 1 VIEW  COMPARISON:  06/14/2014  FINDINGS: New right jugular central line is been placed with the tip in the upper right atrium. No pneumothorax. Stable  volume loss of the right lung with slightly more prominent mid lung atelectasis. Mild interstitial edema suspected. No pleural effusions. Stable cardiac enlargement.  IMPRESSION: Central line tip in upper right atrium. No pneumothorax. Slightly more prominent right mid lung atelectasis. Mild interstitial edema suspected.   Electronically Signed   By: Aletta Edouard [name]   On: 06/16/2014 13:58   Dg Chest Port 1 View  06/14/2014   CLINICAL DATA:  CHF exacerbation, history hypertension and prior AVR  EXAM: PORTABLE CHEST - 1 VIEW  COMPARISON:  Portable exam 2010 hours compared to 06/13/2014  FINDINGS: Enlargement of cardiac silhouette post median sternotomy.  Tortuous aorta.  Mediastinal contours and pulmonary vascularity normal.  Minimal bibasilar atelectasis.  No gross infiltrate, pleural effusion or pneumothorax.  IMPRESSION: Enlargement of cardiac silhouette with minimal bibasilar atelectasis.   Electronically Signed   By: Lavonia Dana [name]   On: 06/14/2014 20:50   Dg Abd Portable 1v  07/06/2014   CLINICAL DATA:  Nasogastric catheter placement  EXAM: PORTABLE ABDOMEN - 1 VIEW  COMPARISON:  None.  FINDINGS: Scattered large and small bowel gas is noted. A nasogastric catheter is noted within the distal aspect of the stomach in satisfactory position.  IMPRESSION: Nasogastric catheter in satisfactory position.   Electronically Signed   By: Inez Catalina [name]   On: 07/06/2014 19:43    ASSESSMENT AND PLAN  69 yo male with PMH aortic root replacement with 55mm St Jude Mechanical AVR in the setting of type A aortic dissection and AI in 1993 by Dr. [name]. Intially presented to [facility] on 06/11/2014 complaining of SOB. 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 Also noted to be in a-fib. Evaluated by CT surgery for consideration of high risk AVR. Cath showed mild CAD however cannot visually distal  RCA system. Surgery delayed due to recurrent GI bleed treated with EGDx2 and clipping. I discharged the patient to [facility] on 6/3 to max out physical therapy before high risk AVR. Underwent dental extraction on 6/10, post op, had VT arrest, s/p CPR and intubation. ID consulted after blood cx came back with enteroccocal infection (likely contamination from central line)  1. VT arrest  - extubated, unclear timing of surgery, still has bleeding from gum after dental extraction and hematuria this morning. On heparin for upper extremity DVT, a-fib and mechanical St jude aortic valve  - recent cath shows no significant ischemia, however has h/o dissection, ?if dissection can lead to ischemia vs ischemia from turtuous RCA limb of Frederich Cha  graft.  - continue to have occasional NSVT, BP up, may tolerate low dose BB. Off amiodarone.  - further workup and surgical plan pending CT surgery decision  2. CAD: no significant CAD on L&RHC 06/19/2014, difficult to see distal RCA system  3. AVR: thrombosed AVR after noncompliant with coumadin for 3 month, planning for high risk AVR at some point  4. Acute on chronic renal insufficiency  5. Persistent atrial fibrillation:   - CHA2DS2-Vasc score of 3, on IV heparin  6. Recent GI bleed.  7. H/o Aortic dissection  8. Hematuria: hgb stable  Hilbert Corrigan PA-C Pager: [phone] Agree with note by Almyra Deforest PA-C  VT arrest post Dental extraction. Quick resuscitation. Nl LV fxn with dysfunctional St Jude AoV probably secondary to thrombosis from not taking coumadin AC. Afib with CVR. Has some PVCs and short runs of NSVT. Will add low dose BB. On IV hep. Bleeding from gums. Dr. PVT has seen. Ultimately will require re do AVR with bioprosthesis. Worry about medication non compliance. I don't know his baseline but it doesn't appear that he is totally cognitively intact.   Lorretta Harp, [name], [place], [facility], Laverta [place] [address] [place], [place]  [id number]  [phone] 07/09/2014 2:04 PM

## 2014-07-09 NOTE — Progress Notes (Signed)
PHARMACY NOTE  Pharmacy Consult :  69 y.o. male is currently on Heparin infusion for DVT, mechanical AV thrombus, and Afib.Marland Kitchen   Dosing Wt :  91.6 kg  LABS :  Recent Labs  07/07/14 0400 07/07/14 1113 07/08/14 0243 07/08/14 0400 07/08/14 1823 07/09/14 0130 07/09/14 0500 07/09/14 1620  HGB 8.8*  --   --  8.4*  --   --  8.6*  --   HCT 28.3*  --   --  26.3*  --   --  27.1*  --   PLT 286  --   --  262  --   --  242  --   HEPARINUNFRC 0.45 0.50 0.70  --  0.61 0.66  --  0.51  CREATININE 2.22*  --   --  2.18*  --   --   --   --     MEDICATION: Infusion[s]: Infusions:  . heparin 1,250 Units/hr (07/09/14 6378)   ASSESSMENT :  69 y.o. male is currently on Heparin infusion for  DVT, mechanical AV thrombus, and Afib.   Heparin infusing at 1250 units/hr.    Heparin level 0.51 units/ml. At upper end of reduced therapeutic goal.  No worsening of bleeding or oozing of gums observed by patient's nurse.  GOAL :  Heparin Level  0.3 - 0.5 units/ml  PLAN : 1. Continue Heparin at 1250 units/hr.  Will check Heparin level, CBC with AM Labs  2. Daily Heparin level, INR, CBC, and Monitor for bleeding complications.  Follow Plat[place] counts.  Marthenia Rolling,  Pharm.D   07/09/2014,  6:13 PM

## 2014-07-09 NOTE — Progress Notes (Addendum)
[facility] for Heparin  Indication: DVT (also mechanical AV thrombosis & Afib)  No Known Allergies  Patient Measurements: Height: 5\' 8"  (172.7 cm) Weight: 233 lb 0.4 oz (105.7 kg) IBW/kg (Calculated) : 68.4 Heparin dosing weight = 91.6 kg Vital Signs: Temp: 98 F (36.7 C) (06/12 2342) Temp Source: Oral (06/12 2342) BP: 130/98 mmHg (06/13 0000) Pulse Rate: 89 (06/13 0000)  Labs:  Recent Labs  07/06/14 1440 07/06/14 2003  07/07/14 0202 07/07/14 0400  07/08/14 0243 07/08/14 0400 07/08/14 1823 07/09/14 0130  HGB 8.9*  --   --   --  8.8*  --   --  8.4*  --   --   HCT 28.5*  --   --   --  28.3*  --   --  26.3*  --   --   PLT 256  --   --   --  286  --   --  262  --   --   HEPARINUNFRC  --   --   < >  --  0.45  < > 0.70  --  0.61 0.66  CREATININE 1.46*  --   --   --  2.22*  --   --  2.18*  --   --   TROPONINI 0.06* 0.07*  --  0.08*  --   --   --   --   --   --   < > = values in this interval not displayed.  Estimated Creatinine Clearance: 38.7 mL/min (by C-G formula based on Cr of 2.18).   Assessment: 69 yo male with Afib/AVR, new DVT, for heparin  Goal of Therapy:  Heparin level 0.3-0.7 Monitor platelets by anticoagulation protocol: Yes   Plan:  Continue Heparin at current rate   Phillis Knack, PharmD, BCPS   07/09/2014 2:45 AM   Addendum: IV Heparin for Upper extremity DVT.  Repeat Heparin level supra therapeutic for lower goal on 1400 units/hr. Levels are foot sticks. H/H is stable. Platelets are within normal limits.  RN reports oozing this AM from mouth and foley.  MD wants goal to be 0.3 to 0.5.   Plan: Decrease heparin to 1250 units/hr.  Recheck heparin level in 6 hours.  Heparin level and CBC daily.  Monitor for new signs and symptoms of bleeding.   Sloan Leiter, PharmD, BCPS Clinical Pharmacist [phone] 07/09/2014, 8:19 AM

## 2014-07-09 NOTE — Progress Notes (Signed)
[facility] for Infectious Disease    Date of Admission:  07/17/2014   Total days of antibiotics 3        Day 2 amp        Day 2 ceftriaxone           ID: [name] is a 69 y.o. male with AVR with hx of vt arrest following teeth extraction, found to have enterococcal bacteremia concern for PVE on ampicillin plus ceftriaxone Principal Problem:   Positive blood cultures Active Problems:   Aortic valve insufficiency   Hx of aortic aneurysm repair   Atrial fibrillation, do not know length of time   Gastric erosion   Acute respiratory failure, unspecified whether with hypoxia or hypercapnia   Anemia   Ventricular tachycardia   Essential hypertension   H/O aortic valve replacement   Acute renal insufficiency    Subjective: Remains afebrile. Still has oozing from gums. No leukocytosis  Medications:  . aminocaproic acid  10 mL Oral Q1H  . ampicillin (OMNIPEN) IV  2 g Intravenous 3 times per day  . cefTRIAXone (ROCEPHIN)  IV  2 g Intravenous Q24H  . ferrous sulfate  325 mg Oral Q breakfast  . pantoprazole  40 mg Oral Q1200  . sodium chloride  10-40 mL Intracatheter Q12H    Objective: Vital signs in last 24 hours: Temp:  [97.6 F (36.4 C)-98.3 F (36.8 C)] 97.7 F (36.5 C) (06/13 0843) Pulse Rate:  [25-103] 90 (06/13 0900) Resp:  [13-19] 19 (06/13 0900) BP: (126-152)/(81-102) 143/91 mmHg (06/13 0900) SpO2:  [80 %-100 %] 100 % (06/13 0900) Weight:  [227 lb 15.3 oz (103.4 kg)] 227 lb 15.3 oz (103.4 kg) (06/13 0459) Physical Exam  Constitutional: He is oriented to person, sitting in bed. He appears well-developed and well-nourished. No distress.  HENT:  Mouth/Throat: blood soaked gauze in mouth Cardiovascular: Normal rate, regular rhythm and normal heart sounds. +mechanical valve sounds Neck: jvd elevated to angle of jaw Pulmonary/Chest: Effort normal and breath sounds normal. No respiratory distress. He has no wheezes.  Abdominal: Soft. Bowel sounds are normal.  He exhibits no distension. There is no tenderness.  Lymphadenopathy:  He has no cervical adenopathy.  Skin: Skin is warm and dry. No rash noted. No erythema. No rash at new picc line placed on 6/12 at right arm   Lab Results  Recent Labs  07/07/14 0400 07/08/14 0400 07/09/14 0500  WBC 9.9 8.6 7.7  HGB 8.8* 8.4* 8.6*  HCT 28.3* 26.3* 27.1*  NA 136 134*  --   K 4.7 4.1  --   CL 106 104  --   CO2 22 23  --   BUN 18 22*  --   CREATININE 2.22* 2.18*  --    Liver Panel  Recent Labs  07/06/14 1440 07/07/14 0400  PROT 6.7 6.1*  ALBUMIN 2.7* 2.6*  AST 27 24  ALT 14* 14*  ALKPHOS 47 44  BILITOT 1.0 0.7   Sedimentation Rate No results for input(s): ESRSEDRATE in the last 72 hours. C-Reactive Protein No results for input(s): CRP in the last 72 hours.  Microbiology: 6/10 blood cx (from picc line x 2) amp S enterococcus  Studies/Results: Dg Chest Port 1 View  07/08/2014   CLINICAL DATA:  68 year old male with respiratory failure  EXAM: PORTABLE CHEST - 1 VIEW  COMPARISON:  Prior chest x-ray 07/07/2014  FINDINGS: The endotracheal tube tip is 4 cm above the carina. A right upper extremity PICC  is present. The catheter tip projects over the superior cavoatrial junction. A nasogastric tube is present. The tip of the tube lies below the diaphragm off the field of view but likely within the stomach. Stable cardiomegaly and aneurysmal dilatation of the aorta. No pneumothorax. Small right layering pleural effusion again noted. Overall, improved inspiratory volumes with decreased pulmonary vascular congestion and decreasing bibasilar atelectasis.  IMPRESSION: 1. Improved inspiratory volumes with decreasing pulmonary vascular congestion and improving bibasilar atelectasis. 2. Stable and satisfactory support apparatus. 3. Stable cardiomegaly and descending thoracic aortic aneurysm.   Electronically Signed   By: Jacqulynn Cadet [name]   On: 07/08/2014 08:36     Assessment/Plan: Enterococcal  bacteremia = presumed to be contaminant since it was isolated from picc line, though no concurrent blood cx taken. We will check repeat blood cx today. Continue on amp plus ceftriaxone. Is still positive, then pursue endocarditis work up, if negative will treat with 2 wk of ampicillin. Continue dual therapy for now  Hematuria = noted on in his foley unit. Will continue to monitor. Probably due to trauma with foley in place  If he has fevers, recommend to reculture peripheral blood cx only  [facility], [facility] for Infectious Diseases Cell: [phone] Pager: [phone]  07/09/2014, 10:59 AM

## 2014-07-10 ENCOUNTER — Inpatient Hospital Stay (HOSPITAL_COMMUNITY): Payer: Commercial Managed Care - HMO

## 2014-07-10 DIAGNOSIS — N189 Chronic kidney disease, unspecified: Secondary | ICD-10-CM

## 2014-07-10 DIAGNOSIS — N390 Urinary tract infection, site not specified: Secondary | ICD-10-CM

## 2014-07-10 DIAGNOSIS — Z7901 Long term (current) use of anticoagulants: Secondary | ICD-10-CM

## 2014-07-10 DIAGNOSIS — I351 Nonrheumatic aortic (valve) insufficiency: Secondary | ICD-10-CM

## 2014-07-10 DIAGNOSIS — Z91199 Patient's noncompliance with other medical treatment and regimen due to unspecified reason: Secondary | ICD-10-CM | POA: Diagnosis present

## 2014-07-10 DIAGNOSIS — I350 Nonrheumatic aortic (valve) stenosis: Secondary | ICD-10-CM

## 2014-07-10 DIAGNOSIS — Z9119 Patient's noncompliance with other medical treatment and regimen: Secondary | ICD-10-CM

## 2014-07-10 DIAGNOSIS — K920 Hematemesis: Secondary | ICD-10-CM

## 2014-07-10 DIAGNOSIS — I4729 Other ventricular tachycardia: Secondary | ICD-10-CM | POA: Diagnosis present

## 2014-07-10 DIAGNOSIS — B952 Enterococcus as the cause of diseases classified elsewhere: Secondary | ICD-10-CM | POA: Diagnosis present

## 2014-07-10 DIAGNOSIS — I472 Ventricular tachycardia: Secondary | ICD-10-CM | POA: Diagnosis present

## 2014-07-10 DIAGNOSIS — I469 Cardiac arrest, cause unspecified: Secondary | ICD-10-CM | POA: Diagnosis present

## 2014-07-10 DIAGNOSIS — I82622 Acute embolism and thrombosis of deep veins of left upper extremity: Secondary | ICD-10-CM

## 2014-07-10 DIAGNOSIS — N179 Acute kidney failure, unspecified: Secondary | ICD-10-CM | POA: Diagnosis present

## 2014-07-10 DIAGNOSIS — R110 Nausea: Secondary | ICD-10-CM

## 2014-07-10 DIAGNOSIS — Z954 Presence of other heart-valve replacement: Secondary | ICD-10-CM

## 2014-07-10 DIAGNOSIS — I82629 Acute embolism and thrombosis of deep veins of unspecified upper extremity: Secondary | ICD-10-CM | POA: Diagnosis present

## 2014-07-10 DIAGNOSIS — R7881 Bacteremia: Secondary | ICD-10-CM

## 2014-07-10 DIAGNOSIS — I482 Chronic atrial fibrillation, unspecified: Secondary | ICD-10-CM | POA: Diagnosis present

## 2014-07-10 DIAGNOSIS — Z01818 Encounter for other preprocedural examination: Secondary | ICD-10-CM

## 2014-07-10 LAB — CBC
HCT: 26.4 % — ABNORMAL LOW (ref 39.0–52.0)
Hemoglobin: 8.3 g/dL — ABNORMAL LOW (ref 13.0–17.0)
MCH: 27.8 pg (ref 26.0–34.0)
MCHC: 31.4 g/dL (ref 30.0–36.0)
MCV: 88.3 fL (ref 78.0–100.0)
PLATELETS: 313 10*3/uL (ref 150–400)
RBC: 2.99 MIL/uL — AB (ref 4.22–5.81)
RDW: 17.7 % — ABNORMAL HIGH (ref 11.5–15.5)
WBC: 6.9 10*3/uL (ref 4.0–10.5)

## 2014-07-10 LAB — BASIC METABOLIC PANEL
ANION GAP: 8 (ref 5–15)
BUN: 18 mg/dL (ref 6–20)
CO2: 24 mmol/L (ref 22–32)
Calcium: 9.4 mg/dL (ref 8.9–10.3)
Chloride: 109 mmol/L (ref 101–111)
Creatinine, Ser: 1.52 mg/dL — ABNORMAL HIGH (ref 0.61–1.24)
GFR calc Af Amer: 53 mL/min — ABNORMAL LOW (ref >60)
GFR, EST NON AFRICAN AMERICAN: 46 mL/min — AB (ref >60)
Glucose, Bld: 100 mg/dL — ABNORMAL HIGH (ref 65–99)
POTASSIUM: 4 mmol/L (ref 3.5–5.1)
SODIUM: 141 mmol/L (ref 135–145)

## 2014-07-10 LAB — GLUCOSE, CAPILLARY: Glucose-Capillary: 102 mg/dL — ABNORMAL HIGH (ref 65–99)

## 2014-07-10 LAB — HEPARIN LEVEL (UNFRACTIONATED)
HEPARIN UNFRACTIONATED: 0.22 [IU]/mL — AB (ref 0.30–0.70)
Heparin Unfractionated: 1.05 IU/mL — ABNORMAL HIGH (ref 0.30–0.70)

## 2014-07-10 MED ORDER — WHITE PETROLATUM GEL
Status: AC
  Administered 2014-07-10: 0.2
  Filled 2014-07-10: qty 1

## 2014-07-10 MED ORDER — ACETAMINOPHEN 160 MG/5ML PO SOLN: 325.0000 mg | ORAL | Status: DC | PRN

## 2014-07-10 MED ORDER — OXYCODONE HCL 5 MG/5ML PO SOLN: 5.0000 mg | ORAL | Status: DC | PRN

## 2014-07-10 MED ORDER — SODIUM CHLORIDE 0.9 % IV SOLN
INTRAVENOUS | Status: DC
  Administered 2014-07-10 – 2014-07-13 (×2): via INTRAVENOUS

## 2014-07-10 MED ORDER — AMPICILLIN SODIUM 2 G IJ SOLR
2.0000 g | INTRAMUSCULAR | Status: DC
  Administered 2014-07-10 – 2014-07-16 (×34): 2 g via INTRAVENOUS
  Filled 2014-07-10 (×40): qty 2000

## 2014-07-10 MED ORDER — METOPROLOL TARTRATE 1 MG/ML IV SOLN
5.0000 mg | INTRAVENOUS | Status: DC
  Administered 2014-07-10 – 2014-07-11 (×4): 5 mg via INTRAVENOUS
  Filled 2014-07-10 (×9): qty 5

## 2014-07-10 MED ORDER — PANTOPRAZOLE SODIUM 40 MG IV SOLR
40.0000 mg | Freq: Two times a day (BID) | INTRAVENOUS | Status: DC
  Administered 2014-07-10 – 2014-07-23 (×26): 40 mg via INTRAVENOUS
  Filled 2014-07-10 (×31): qty 40

## 2014-07-10 MED ORDER — HEPARIN (PORCINE) IN NACL 100-0.45 UNIT/ML-% IJ SOLN
1000.0000 [IU]/h | INTRAMUSCULAR | Status: DC
  Administered 2014-07-10: 1000 [IU]/h via INTRAVENOUS
  Filled 2014-07-10: qty 250

## 2014-07-10 MED ORDER — HYDRALAZINE HCL 20 MG/ML IJ SOLN
5.0000 mg | INTRAMUSCULAR | Status: DC | PRN
  Administered 2014-07-11 – 2014-07-16 (×6): 5 mg via INTRAVENOUS
  Filled 2014-07-10 (×8): qty 1

## 2014-07-10 MED ORDER — HEPARIN (PORCINE) IN NACL 100-0.45 UNIT/ML-% IJ SOLN
900.0000 [IU]/h | INTRAMUSCULAR | Status: DC
  Administered 2014-07-11: 1050 [IU]/h via INTRAVENOUS
  Administered 2014-07-12: 1400 [IU]/h via INTRAVENOUS
  Administered 2014-07-12: 1350 [IU]/h via INTRAVENOUS
  Administered 2014-07-13 – 2014-07-14 (×2): 1300 [IU]/h via INTRAVENOUS
  Administered 2014-07-15: 900 [IU]/h via INTRAVENOUS
  Administered 2014-07-15: 1300 [IU]/h via INTRAVENOUS
  Filled 2014-07-10 (×12): qty 250

## 2014-07-10 NOTE — Progress Notes (Signed)
PROGRESS NOTE: POST OPERATIVE DAY #5  07/10/2014 [name] [id number]  VITALS: BP 160/96 mmHg  Pulse 90  Temp(Src) 98.6 F (37 C) (Axillary)  Resp 20  Ht 5\' 8"  (1.727 m)  Wt 231 lb 0.7 oz (104.8 kg)  BMI 35.14 kg/m2  SpO2 99%  LABS:  Lab Results  Component Value Date   WBC 6.9 07/10/2014   HGB 8.3* 07/10/2014   HCT 26.4* 07/10/2014   MCV 88.3 07/10/2014   PLT 313 07/10/2014   BMET    Component Value Date/Time   NA 141 07/10/2014 0420   K 4.0 07/10/2014 0420   CL 109 07/10/2014 0420   CO2 24 07/10/2014 0420   GLUCOSE 100* 07/10/2014 0420   BUN 18 07/10/2014 0420   CREATININE 1.52* 07/10/2014 0420   CALCIUM 9.4 07/10/2014 0420   GFRNONAA 46* 07/10/2014 0420   GFRAA 53* 07/10/2014 0420    Lab Results  Component Value Date   INR 1.37 06/21/2014   INR 1.67* 06/15/2014   No results [name] for: PTT  Recent Labs  07/08/14 0400  07/09/14 0130 07/09/14 0500 07/09/14 1620 07/10/14 0420  HGB 8.4* --  --  8.6* --  --   HCT 26.3* --  --  27.1* --  --   PLT 262 --  --  242 --  --   HEPARINUNFRC --  < > 0.66 --  0.51 1.05*          [name] is status post multiple extractions with alveoloplasty and gross debridement of remaining teeth. Patient with anticipated redo of aortic valve replacement with Dr. Tharon Aquas Trigt. Patient now seen to evaluate for persistent bleeding from the extraction sites secondary to heparin therapy.  SUBJECTIVE: Patient again indicates that he is doing "fair".  Patient denies any significant oral discomfort.  EXAM:  There is no sign of infection. Copious amounts of heme tinted saliva is noted. Sutures are still intact. Clots are present.  ASSESSMENT: Persistent postoperative bleeding from dental extraction sites consistent with elevated unfractionated heparin levels.  PLAN: 1. Follow pharmacy dosing of heparin to minimize postoperative bleeding from dental extraction  sites.  2. 4 x 4 gauze to extraction sites as needed for persistent oozing. 3. Amicar rinses as needed for persistent oozing. 4. Follow-up as needed.   [name], DDS

## 2014-07-10 NOTE — Progress Notes (Signed)
Subjective:  Pt alert and oriented, NAD. Still bleeding from mouth secondary to teeth extraction  Objective:  Temp:  [97.5 F (36.4 C)-98.7 F (37.1 C)] 97.8 F (36.6 C) (06/14 0828) Pulse Rate:  [81-107] 95 (06/14 0853) Resp:  [17-28] 20 (06/14 0700) BP: (143-186)/(90-114) 160/96 mmHg (06/14 0700) SpO2:  [90 %-100 %] 96 % (06/14 0853) Weight:  [231 lb 0.7 oz (104.8 kg)] 231 lb 0.7 oz (104.8 kg) (06/14 0600) Weight change: 3 lb 1.4 oz (1.4 kg)  Intake/Output from previous day: 06/13 0701 - 06/14 0700 In: 485.6 [I.V.:235.6; IV Piggyback:250] Out: 640 [Urine:640]  Intake/Output from this shift:    Physical Exam: General appearance: alert and slowed mentation Neck: no adenopathy, no carotid bruit, no JVD, supple, symmetrical, trachea midline and thyroid not enlarged, symmetric, no tenderness/mass/nodules Lungs: clear to auscultation bilaterally Heart: irregularly irregular rhythm and Crisp VS, soft outflow murmur and diastolic murmur Extremities: extremities normal, atraumatic, no cyanosis or edema  Lab Results: Results for orders placed or performed during the hospital encounter of 07/07/2014 (from the past 48 hour(s))  Glucose, capillary     Status: Abnormal   Collection Time: 07/08/14 12:17 PM  Result Value Ref Range   Glucose-Capillary 114 (H) 65 - 99 mg/dL  Glucose, capillary     Status: None   Collection Time: 07/08/14  4:36 PM  Result Value Ref Range   Glucose-Capillary 93 65 - 99 mg/dL  Heparin level (unfractionated)     Status: None   Collection Time: 07/08/14  6:23 PM  Result Value Ref Range   Heparin Unfractionated 0.61 0.30 - 0.70 IU/mL    Comment:        IF HEPARIN RESULTS ARE BELOW EXPECTED VALUES, AND PATIENT DOSAGE HAS BEEN CONFIRMED, SUGGEST FOLLOW UP TESTING OF ANTITHROMBIN III LEVELS.   Glucose, capillary     Status: None   Collection Time: 07/08/14  7:54 PM  Result Value Ref Range   Glucose-Capillary 95 65 - 99 mg/dL  Glucose, capillary      Status: None   Collection Time: 07/08/14 11:42 PM  Result Value Ref Range   Glucose-Capillary 99 65 - 99 mg/dL  Heparin level (unfractionated)     Status: None   Collection Time: 07/09/14  1:30 AM  Result Value Ref Range   Heparin Unfractionated 0.66 0.30 - 0.70 IU/mL    Comment:        IF HEPARIN RESULTS ARE BELOW EXPECTED VALUES, AND PATIENT DOSAGE HAS BEEN CONFIRMED, SUGGEST FOLLOW UP TESTING OF ANTITHROMBIN III LEVELS.   Glucose, capillary     Status: None   Collection Time: 07/09/14  3:46 AM  Result Value Ref Range   Glucose-Capillary 83 65 - 99 mg/dL  CBC     Status: Abnormal   Collection Time: 07/09/14  5:00 AM  Result Value Ref Range   WBC 7.7 4.0 - 10.5 K/uL   RBC 3.10 (L) 4.22 - 5.81 MIL/uL   Hemoglobin 8.6 (L) 13.0 - 17.0 g/dL   HCT 27.1 (L) 39.0 - 52.0 %   MCV 87.4 78.0 - 100.0 fL   MCH 27.7 26.0 - 34.0 pg   MCHC 31.7 30.0 - 36.0 g/dL   RDW 17.6 (H) 11.5 - 15.5 %   Platelets 242 150 - 400 K/uL  Glucose, capillary     Status: None   Collection Time: 07/09/14  8:40 AM  Result Value Ref Range   Glucose-Capillary 68 65 - 99 mg/dL  Glucose, capillary  Status: None   Collection Time: 07/09/14  8:47 AM  Result Value Ref Range   Glucose-Capillary 81 65 - 99 mg/dL  Provider-confirm verbal Blood Bank order - Type & Screen; Order taken: 07/16/2014; 2:00 PM; STAT, Surgery     Status: None   Collection Time: 07/09/14  9:14 AM  Result Value Ref Range   Blood product order confirm MD AUTHORIZATION REQUESTED   Type and screen     Status: None   Collection Time: 07/09/14  9:55 AM  Result Value Ref Range   ABO/RH(D) A POS    Antibody Screen NEG    Sample Expiration 07/12/2014   Glucose, capillary     Status: None   Collection Time: 07/09/14 11:45 AM  Result Value Ref Range   Glucose-Capillary 83 65 - 99 mg/dL  Heparin level (unfractionated)     Status: None   Collection Time: 07/09/14  4:20 PM  Result Value Ref Range   Heparin Unfractionated 0.51 0.30 - 0.70 IU/mL     Comment:        IF HEPARIN RESULTS ARE BELOW EXPECTED VALUES, AND PATIENT DOSAGE HAS BEEN CONFIRMED, SUGGEST FOLLOW UP TESTING OF ANTITHROMBIN III LEVELS.   Culture, blood (routine x 2)     Status: None (Preliminary result)   Collection Time: 07/09/14  4:20 PM  Result Value Ref Range   Specimen Description BLOOD LEFT FOOT    Special Requests BOTTLES DRAWN AEROBIC ONLY 10CC    Culture             BLOOD CULTURE RECEIVED NO GROWTH TO DATE CULTURE WILL BE HELD FOR 5 DAYS BEFORE ISSUING A FINAL NEGATIVE REPORT Performed at [facility]    Report Status PENDING   Culture, blood (routine x 2)     Status: None (Preliminary result)   Collection Time: 07/09/14  4:30 PM  Result Value Ref Range   Specimen Description BLOOD RIGHT FOOT    Special Requests BOTTLES DRAWN AEROBIC ONLY 3CC    Culture             BLOOD CULTURE RECEIVED NO GROWTH TO DATE CULTURE WILL BE HELD FOR 5 DAYS BEFORE ISSUING A FINAL NEGATIVE REPORT Performed at [facility]    Report Status PENDING   CBC     Status: Abnormal   Collection Time: 07/10/14  4:20 AM  Result Value Ref Range   WBC 6.9 4.0 - 10.5 K/uL   RBC 2.99 (L) 4.22 - 5.81 MIL/uL   Hemoglobin 8.3 (L) 13.0 - 17.0 g/dL   HCT 26.4 (L) 39.0 - 52.0 %   MCV 88.3 78.0 - 100.0 fL   MCH 27.8 26.0 - 34.0 pg   MCHC 31.4 30.0 - 36.0 g/dL   RDW 17.7 (H) 11.5 - 15.5 %   Platelets 313 150 - 400 K/uL  Heparin level (unfractionated)     Status: Abnormal   Collection Time: 07/10/14  4:20 AM  Result Value Ref Range   Heparin Unfractionated 1.05 (H) 0.30 - 0.70 IU/mL    Comment:        IF HEPARIN RESULTS ARE BELOW EXPECTED VALUES, AND PATIENT DOSAGE HAS BEEN CONFIRMED, SUGGEST FOLLOW UP TESTING OF ANTITHROMBIN III LEVELS.   Basic metabolic panel     Status: Abnormal   Collection Time: 07/10/14  4:20 AM  Result Value Ref Range   Sodium 141 135 - 145 mmol/L   Potassium 4.0 3.5 - 5.1 mmol/L   Chloride 109 101 - 111 mmol/L  CO2 24 22 - 32  mmol/L   Glucose, Bld 100 (H) 65 - 99 mg/dL   BUN 18 6 - 20 mg/dL   Creatinine, Ser 1.52 (H) 0.61 - 1.24 mg/dL   Calcium 9.4 8.9 - 10.3 mg/dL   GFR calc non Af Amer 46 (L) >60 mL/min   GFR calc Af Amer 53 (L) >60 mL/min    Comment: (NOTE) The eGFR has been calculated using the CKD EPI equation. This calculation has not been validated in all clinical situations. eGFR's persistently <60 mL/min signify possible Chronic Kidney Disease.    Anion gap 8 5 - 15    Imaging: Imaging results have been reviewed  Assessment/Plan:   1. Principal Problem: 2.   Positive blood cultures 3. Active Problems: 4.   Aortic valve insufficiency 5.   Hx of aortic aneurysm repair 6.   Atrial fibrillation, do not know length of time 7.   Gastric erosion 8.   Acute respiratory failure, unspecified whether with hypoxia or hypercapnia 9.   Anemia 10.   Ventricular tachycardia 11.   Essential hypertension 12.   H/O aortic valve replacement 13.   Acute renal insufficiency 14.   AI (aortic insufficiency) 15.   Time Spent Directly with Patient:  20 minutes  Length of Stay:  LOS: 5 days   Tx from [name]---> 2H. Remains in AFIB with CVR on IV hep. VSS. Getting ATBX. S/P teeth extraction in preparation for redo AVR. Continues to bleed from his mouth. TCTS and ID continue to follow. Nothing further to add at this time.  Quay Burow 07/10/2014, 8:59 AM

## 2014-07-10 NOTE — Progress Notes (Signed)
Nutrition Follow-up  DOCUMENTATION CODES:  Obesity unspecified  INTERVENTION:   (RD to follow for diet advancement and/or initiation of nutrition support)   If pt starts enteral nutrition support, recommend:   Initiate Osmolite 1.2 @ 20 ml/hr and increase by 10 ml every 4 hours to goal rate of 65 ml/hr.    Tube feeding regimen provides 1872 kcal (100% of needs), 87 grams of protein, and 1279 ml of H2O.   NUTRITION DIAGNOSIS:  Inadequate oral intake related to dysphagia as evidenced by NPO status.  Ongoing  GOAL:  Patient will meet greater than or equal to 90% of their needs  Unmet  MONITOR:  Diet advancement, Labs, Weight trends, Skin, I & O's  REASON FOR ASSESSMENT:  Consult Enteral/tube feeding initiation and management  ASSESSMENT: 69 y/o M initially admitted to [facility] on 5/19 with SOB in the setting of AV insufficiency after developing a partially thrombosed mechanical AV after stopping coumadin for unclear reasons. He was scheduled for an AVR on 5/26 but developed recurrent melena. He underwent an EGD with clipping of gastric erosion on 5/26.  S/p Procedure(s) (LRB) on 07/10/14: BENTALL PROCEDURE (N/A) REDO STERNOTOMY (N/A) TRANSESOPHAGEAL ECHOCARDIOGRAM (TEE) (N/A)  Pt underwent multiple dental extractions on 07/10/2014.   Pt extubated on 07/08/14.   Per CTS note, pt has tested positive for enterococcal bactremia. ID following.   Pt was advanced to a full liquid diet. Pt continues with persistent post-operative bleeding in the mouth from dental extractions.   Per RN, PO's are being held right now due to concern for dysphagia. Plan for FEES later today.   Addendum: Pt s/p FEES which revealed severe oral phase dysphagia. SLP recommending temporary nutrition via alternative means. RD to provide TF recommendations.   Height:  Ht Readings from Last 1 Encounters:  07/06/2014 5\' 8"  (1.727 m)    Weight:  Wt Readings from Last 1 Encounters:  07/10/14 231 lb 0.7  oz (104.8 kg)    Ideal Body Weight:  70 kg  Wt Readings from Last 10 Encounters:  07/10/14 231 lb 0.7 oz (104.8 kg)  07/17/2014 226 lb (102.513 kg)  06/29/14 220 lb 0.3 oz (99.8 kg)  12/27/12 223 lb (101.152 kg)  11/30/12 223 lb 12.8 oz (101.515 kg)  01/13/10 236 lb (107.049 kg)    BMI:  Body mass index is 35.14 kg/(m^2).  Estimated Nutritional Needs:  Kcal:  1800-2000  Protein:  95-105 grams  Fluid:  1.8-2.0 L  Skin:  Reviewed, no issues  Diet Order:  Diet NPO time specified  EDUCATION NEEDS:  No education needs identified at this time   Intake/Output Summary (Last 24 hours) at 07/10/14 1712 Last data filed at 07/10/14 1638  Gross per 24 hour  Intake  587.5 ml  Output    360 ml  Net  227.5 ml    Last BM:  07/04/14  Gustavo Dispenza A. Jimmye Norman, RD, LDN, CDE Pager: [phone] After hours Pager: [phone]

## 2014-07-10 NOTE — Progress Notes (Signed)
I met with pt, Dr. Prescott Gum, and daughter, [name] at bedside this morning. We discussed the possibility of readmission to inpt rehab to improve pt functionally for possible 2 weeks, continue IV antibiotics prior to hopeful surgery at the end of the month in two weeks. I will discuss with Dr. Naaman Plummer and then proceed with [facility] insurance for their approval if Dr. Naaman Plummer is in agreement.  I will follow up tomorrow. [phone]

## 2014-07-10 NOTE — Progress Notes (Signed)
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 [name].   Presented to [facility] hospital on 06/11/2014 complaining of SOB. 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 Also noted to be in a-fib. Evaluated by CT surgery for consideration of high risk AVR. Cath showed mild CAD however cannot visually distal RCA system. Surgery delayed due to recurrent GI bleed treated with EGDx2 and clipping. I discharged the patient to CIR on 6/3 to max out physical therapy before high risk AVR. Underwent dental extraction on 6/10, post op, had VT arrest, s/p CPR and intubation. ID consulted after blood cx came back with enteroccocal infection (likely contamination from central line)  HPI/Subjective: 6/14 A/O x 4 States positive DOE, Positive sore mouth.   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 Review of findings from 5/24 cardiac catheterization does not mention thrombosed aortic valve. -AVR dysfunction secondary to one of the leaflets being immobile see TEE results 5/20 below -Heparin held today secondary to bleeding from dental extractions will be restarted by pharmacy tonight.  -CT surgery states patient is not currently a candidate for placement and tell completed 2 weeks antibiotics and a course of physical therapy at CIR  Noncompliant with Coumadin therapy for 3+ months -See thrombosed aortic valve  LUE  DVT -Heparin per pharmacy  NSVT/HTN -start metoprolol IV 5 mg q 4hr -Hydralazine IV PRN SBP> 160 or DBP> 100  Chronic atrial fibrillation:  - CHA2DS2-Vasc score of 3, 2dary to uncontrolled Bleeding from teeth extraction D/C  Heparin gtt. Spoke with Dr. [name] (Cardiology) & recommended we restart per Cardiology Pharmacist  -SCD  -Rate controlled with IV metoprolol   Acute on chronic renal failure -Cr improving, continue monitor closely  Hematemesis - 2dary  To uncontrolled bleeding from teeth extraction D/C Heparin gtt Restart per Cardiology Pharmacist   Enterococcal bacteremia -Possible and contaminant since it was isolated from picc line.  -CT surgery requests patient complete 2 weeks of antibiotics  -Continue on amp plus ceftriaxone.  -Recheck blood cultures, if negative will DC ceftriaxone and complete 2 weeks of antibiotics with ampicillin  Recent GI bleed.  H/o Aortic dissection  Hematuria: hgb stable   Code Status: FULL Family Communication: no family present at time of exam Disposition Plan: Resolution of bacteremia: CIR    Consultants: Dr.[name] (PCCM) Dr.[name] (cardiology) Dr.[name] (infectious disease) DDS [name] (dental) Dr.[name] (cardiothoracic surgery)   Procedure/Significant Events: 5/20 TEE'; - LVEF=55% to 60%.- Aortic valve: St. Jude Mechanical AVR is present. Only one of the leaflets visualized as moving; significantly reduced; other leaflet is immobile.  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 - Mitral valve: moderate regurgitation. - Left atrium: severely dilated. No thrombus in the atrial cavity or appendage. - Right atrium: moderately dilated. Nothrombus in the atrial cavity or appendage. -Tricuspid valve: moderate regurgitation.  5/24 upper extremity Doppler;acute  deep vein thrombosis involving axillary  and brachial veins LUE -Superficial thrombosis in Basilic vein.    Culture 6/9 MRSA by PCR negative 6/10 blood central line 2 positive enterococcus 6/13 blood left/right foot NGTD   Antibiotics: Ampicillin 6/12>> Ceftriaxone 6/12>>  DVT prophylaxis: Heparin   Devices    LINES / TUBES:      Continuous Infusions: . sodium chloride    . heparin      Objective: VITAL SIGNS: Temp: 97.3 F (36.3 C) (06/14 1900) Temp Source: Oral (06/14 1900) BP: 91/50 mmHg (06/14 1900) Pulse Rate: 84 (06/14 1900) SPO2; FIO2:   Intake/Output Summary (Last 24 hours) at 07/10/14 1943 Last data filed at 07/10/14 1800  Gross per 24 hour  Intake    685 ml  Output    660 ml  Net     25 ml     Exam: General: A/O x 4, NAD  No acute respiratory distress Eyes: Negative headache, eye pain, double vision, scotomas, floaters, negative retinal hemorrhage ENT: Negative Runny nose, positive gingival bleeding,Positive odynophagia Neck:  Negative scars, masses, torticollis, lymphadenopathy, JVD Lungs: Clear to auscultation bilaterally without wheezes or crackles Cardiovascular:  IRIR rate and rhythm with grade 4/6 systolic murmur (click), normal S1 and S2 Abdomen: negative abdominal pain, negative dysphagia, Nontender, nondistended, soft, bowel sounds positive, no rebound, no ascites, no appreciable mass Extremities: No significant cyanosis, clubbing, or edema bilateral lower extremities Psychiatric:  Negative depression, negative anxiety, negative fatigue, negative mania  Neurologic:  Cranial nerves II through XII intact, tongue/uvula midline, all extremities muscle strength 5/5, sensation intact throughout, positive dysarthria, negative expressive aphasia, negative receptive aphasia.  Endocrine;     Data Reviewed: Basic Metabolic Panel:  Recent Labs Lab 07/06/14 0519 07/06/14 1440 07/07/14 0400 07/08/14 0400 07/10/14 0420  NA 134* 133* 136 134* 141  K 4.6 4.7 4.7 4.1 4.0  CL  102 103 106 104 109  CO2 23 21* 22 23 24   GLUCOSE 119* 128* 90 113* 100*  BUN 12 12 18  22* 18  CREATININE 1.22 1.46* 2.22* 2.18* 1.52*  CALCIUM 8.7* 8.8* 8.9 8.6* 9.4  MG  --  1.8 2.0 2.0  --   PHOS  --   --   --  4.2  --    Liver Function Tests:  Recent Labs Lab 07/06/14 0519 07/06/14 1440 07/07/14 0400  AST 21 27 24   ALT 12* 14* 14*  ALKPHOS 47 47 44  BILITOT 0.7 1.0 0.7  PROT 6.4* 6.7 6.1*  ALBUMIN 2.6* 2.7* 2.6*   No results for input(s): LIPASE, AMYLASE in the last 168 hours. No results for input(s): AMMONIA in the last 168 hours. CBC:  Recent Labs Lab 07/06/14 1440 07/07/14 0400 07/08/14 0400 07/09/14 0500 07/10/14 0420  WBC 9.7 9.9 8.6 7.7 6.9  HGB 8.9* 8.8* 8.4* 8.6* 8.3*  HCT 28.5* 28.3* 26.3* 27.1* 26.4*  MCV 89.3 89.3 87.4 87.4 88.3  PLT 256 286 262 242 313   Cardiac Enzymes:  Recent Labs Lab 07/06/14 1440 07/06/14 2003 07/07/14 0202  TROPONINI 0.06* 0.07* 0.08*   BNP (last 3 results)  Recent Labs  06/14/14 2105  BNP 1438.6*    ProBNP (last 3 results) No results for input(s): PROBNP in the last 8760 hours.  CBG:  Recent Labs Lab 07/09/14 0346 07/09/14 0840 07/09/14 0847 07/09/14 1145 07/10/14 1620  GLUCAP 83 68 81 83 102*    Recent Results (from the past 240 hour(s))  MRSA PCR Screening     Status: None  Collection Time: 07/12/2014  5:00 PM  Result Value Ref Range Status   MRSA by PCR NEGATIVE NEGATIVE Final    Comment:        The GeneXpert MRSA Assay (FDA approved for NASAL specimens only), is one component of a comprehensive MRSA colonization surveillance program. It is not intended to diagnose MRSA infection nor to guide or monitor treatment for MRSA infections.   Culture, blood (routine x 2)     Status: None   Collection Time: 07/06/14  4:20 PM  Result Value Ref Range Status   Specimen Description BLOOD CENTRAL LINE  Final   Special Requests BOTTLES DRAWN AEROBIC ONLY 10CC  Final   Culture   Final     ENTEROCOCCUS SPECIES Note: SUSCEPTIBILITIES PERFORMED ON PREVIOUS CULTURE WITHIN THE LAST 5 DAYS. Note: Gram Stain Report Called to,Read Back By and Verified With: A AFATSAWO 07/07/14 1055 BY [email or web address] Performed at [facility]    Report Status 07/09/2014 FINAL  Final  Culture, blood (routine x 2)     Status: None   Collection Time: 07/06/14  4:25 PM  Result Value Ref Range Status   Specimen Description BLOOD CENTRAL LINE  Final   Special Requests BOTTLES DRAWN AEROBIC ONLY 10CC  Final   Culture   Final    ENTEROCOCCUS SPECIES Note: Gram Stain Report Called to,Read Back By and Verified With: A AFATSAWO 07/07/14 1055 BY SMITHERSJ Performed at [facility]    Report Status 07/09/2014 FINAL  Final   Organism ID, Bacteria ENTEROCOCCUS SPECIES  Final      Susceptibility   Enterococcus species - MIC*    AMPICILLIN <=2 SENSITIVE Sensitive     VANCOMYCIN 2 SENSITIVE Sensitive     * ENTEROCOCCUS SPECIES  Culture, blood (routine x 2)     Status: None (Preliminary result)   Collection Time: 07/09/14  4:20 PM  Result Value Ref Range Status   Specimen Description BLOOD LEFT FOOT  Final   Special Requests BOTTLES DRAWN AEROBIC ONLY 10CC  Final   Culture   Final           BLOOD CULTURE RECEIVED NO GROWTH TO DATE CULTURE WILL BE HELD FOR 5 DAYS BEFORE ISSUING A FINAL NEGATIVE REPORT Performed at [facility]    Report Status PENDING  Incomplete  Culture, blood (routine x 2)     Status: None (Preliminary result)   Collection Time: 07/09/14  4:30 PM  Result Value Ref Range Status   Specimen Description BLOOD RIGHT FOOT  Final   Special Requests BOTTLES DRAWN AEROBIC ONLY 3CC  Final   Culture   Final           BLOOD CULTURE RECEIVED NO GROWTH TO DATE CULTURE WILL BE HELD FOR 5 DAYS BEFORE ISSUING A FINAL NEGATIVE REPORT Performed at [facility]    Report Status PENDING  Incomplete     Studies:  Recent x-ray studies have been reviewed in detail by the  Attending Physician  Scheduled Meds:  Scheduled Meds: . ampicillin (OMNIPEN) IV  2 g Intravenous 6 times per day  . cefTRIAXone (ROCEPHIN)  IV  2 g Intravenous Q12H  . ferrous sulfate  325 mg Oral Q breakfast  . metoprolol  5 mg Intravenous Q4H  . pantoprazole (PROTONIX) IV  40 mg Intravenous Q12H  . sodium chloride  10-40 mL Intracatheter Q12H    Time spent on care of this patient: 40 mins   WOODS, Geraldo Docker , MD  [facility] Office  [phone] Pager - [phone]  On-Call/Text Page:      [email or web address]      password [email or web address]  If 7PM-7AM, please contact night-coverage [email or web address] Password [facility] 07/10/2014, 7:43 PM   LOS: 5 days   Care during the described time interval was provided by me .  I have reviewed this patient's available data, including medical history, events of note, physical examination, and all test results as part of my evaluation. I have personally reviewed and interpreted all radiology studies.   Dia Crawford, MD [phone] Pager

## 2014-07-10 NOTE — Progress Notes (Signed)
[place] for Heparin  Indication: DVT (also mechanical AV thrombosis & Afib)  No Known Allergies  Patient Measurements: Height: 5\' 8"  (172.7 cm) Weight: 227 lb 15.3 oz (103.4 kg) IBW/kg (Calculated) : 68.4 Heparin dosing weight = 91.6 kg Vital Signs: Temp: 98.4 F (36.9 C) (06/13 2300) Temp Source: Axillary (06/13 2300) BP: 166/98 mmHg (06/14 0000) Pulse Rate: 85 (06/14 0000)  Labs:  Recent Labs  07/08/14 0400  07/09/14 0130 07/09/14 0500 07/09/14 1620 07/10/14 0420  HGB 8.4*  --   --  8.6*  --   --   HCT 26.3*  --   --  27.1*  --   --   PLT 262  --   --  242  --   --   HEPARINUNFRC  --   < > 0.66  --  0.51 1.05*  CREATININE 2.18*  --   --   --   --  1.52*  < > = values in this interval not displayed.  Estimated Creatinine Clearance: 55 mL/min (by C-G formula based on Cr of 1.52).   Assessment: 69 yo male with Afib/AVR, new DVT, for heparin  Goal of Therapy:  Heparin level 0.3-0.5 Monitor platelets by anticoagulation protocol: Yes   Plan:  Hold heparin x 1 hours, then decrease heparin 1000 units/hr Check heparin level in 8 hours.    Phillis Knack, PharmD, BCPS   07/10/2014 5:08 AM

## 2014-07-10 NOTE — Evaluation (Signed)
Clinical/Bedside Swallow Evaluation Patient Details  Name: [name] MRN: [id number] Date of Birth: 1945/11/22  Today's Date: 07/10/2014 Time: SLP Start Time (ACUTE ONLY): 0850 SLP Stop Time (ACUTE ONLY): 0915 SLP Time Calculation (min) (ACUTE ONLY): 25 min  Past Medical History:  Past Medical History  Diagnosis Date  . Chest pain   . Hypertension   . Aortic aneurysm 1993  . 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 Pneumonia   . Aortic valve replaced      05/2014 transfered [place] to [facility] for HF in the setting of failed valve repair. 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 CT surgery considering AVR, delayed due to GI bleed  . Hx of aortic aneurysm repair 06/14/2014  . Atrial fibrillation 05/2014  . Gastritis and gastroduodenitis with hemorrhage 05/2014    severe.   . Tubular adenoma of colon 12/2012  . DVT of upper extremity (deep vein thrombosis) 05/2014    left  . Acute blood loss anemia 05/2014   . CAD (coronary artery disease)     minimal CAD on cath 05/2014   Past Surgical History:  Past Surgical History  Procedure Laterality Date  . Abdominal aortic aneurysm repair  1993    At [facility], Unspecified  . Splenectomy    . Cardiac valve replacement      At [facility]  . Esophagogastroduodenoscopy  Aug 2014    [facility]: large duodenal ulcer with visible vessel, s/p clip and epi, erosive gastritis. +h.pylori serology, treated with amoxicillin and biaxin.   . Colonoscopy N/A 12/27/2012    Procedure: COLONOSCOPY;  Surgeon: Danie Binder, MD;  [facility];  Service: Endoscopy;  Laterality: N/A;  8:45-moved to 855   . Tee without cardioversion N/A 06/15/2014    Procedure: TRANSESOPHAGEAL ECHOCARDIOGRAM (TEE);  Surgeon: Sueanne Margarita, MD;  Location: [name] ENDOSCOPY;  Service: Cardiovascular;  Laterality: N/A;  . Cardiac catheterization N/A 06/15/2014    Procedure: Fluoroscopy Guidance;  Surgeon: Josue Hector, MD;   Location: [place] CV LAB;  Service: Cardiovascular;  Laterality: N/A;  . Esophagogastroduodenoscopy N/A 06/18/2014    Procedure: ESOPHAGOGASTRODUODENOSCOPY (EGD);  Surgeon: Jerene Bears, MD;  Location: [facility] ENDOSCOPY;  Service: Endoscopy;  Laterality: N/A;  . Peripheral vascular catheterization  06/19/2014    Procedure: Aortic Arch Angiography;  Surgeon: Jettie Booze, MD;  Location: [place] CV LAB;  Service: Cardiovascular;;  . Esophagogastroduodenoscopy N/A 06/21/2014    Procedure: ESOPHAGOGASTRODUODENOSCOPY (EGD);  Surgeon: Jerene Bears, MD;  Location: [facility] ENDOSCOPY;  Service: Endoscopy;  Laterality: N/A;  . Esophagogastroduodenoscopy N/A 06/21/2014    Procedure: ESOPHAGOGASTRODUODENOSCOPY (EGD);  Surgeon: Jerene Bears, MD;  Location: [place];  Service: Gastroenterology;  Laterality: N/A;  . Multiple extractions with alveoloplasty N/A 07/18/2014    Procedure: Extraction of tooth #'s 3,4,5,13,14,21,23,24,25,26, with alveoloplasty and gross debridement of remaining teeth.;  Surgeon: [name], DDS;  Location: [place];  Service: Oral Surgery;  Laterality: N/A;   HPI:  69 y.o. 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 Plan to have AVR redo by Dr Nils Pyle at the end of June. He underwent Multiple tooth extractions on 07/06/2014. Post op complicated by VT arrest requiring ~ 2 min of CPR. Intubated  07/06/14. Extubated 07/08/14.Continues to have copious oral bleeding, pt is on heparin.    Assessment / Plan / Recommendation Clinical Impression  Pt demonstrates immediate signs of aspiration with thin liquids (reflexive coughing) as well as concern for weak oropahryngeal mechanism including decreased laryngeal elevation, mulitple swallows  and expectoration of POs post swallow. The pt also volitionally expectorates bloody mucous very frequently. FEES needed to determine objectively if pt is tolerating POs without high risk of aspiration. Will advise RN to hold off on most PO (except meds and water) until test complete.     Aspiration Risk  Moderate    Diet Recommendation NPO except meds   Medication Administration: Whole meds with liquid    Other  Recommendations Oral Care Recommendations: Oral care QID   Follow Up Recommendations       Frequency and Duration        Pertinent Vitals/Pain NA    SLP Swallow Goals     Swallow Study Prior Functional Status       General Other Pertinent Information: 69 y.o. 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 Plan to have AVR redo by Dr Nils Pyle at the end of June. He underwent Multiple tooth extractions on 07/24/2014. Post op complicated by VT arrest requiring ~ 2 min of CPR. Intubated 07/06/14. Extubated 07/08/14.Continues to have copious oral bleeding, pt is on heparin.  Type of Study: Bedside swallow evaluation Diet Prior to this Study: Thin liquids Temperature Spikes Noted: No Respiratory Status: Room air History of Recent Intubation: Yes Length of Intubations (days): 2 days Date extubated: 07/08/14 Behavior/Cognition: Alert;Cooperative;Requires cueing Oral Cavity - Dentition: Poor condition;Missing dentition (bleeding gums- copious) Self-Feeding Abilities: Able to feed self Patient Positioning: Upright in chair/Tumbleform Baseline Vocal Quality: Normal Volitional Cough: Strong Volitional Swallow: Able to elicit    Oral/Motor/Sensory Function Overall Oral Motor/Sensory Function: Appears within functional limits for  tasks assessed   Ice Chips     Thin Liquid Thin Liquid: Impaired Presentation: Cup;Straw Pharyngeal  Phase Impairments: Suspected delayed Swallow;Throat Clearing - Immediate;Cough - Immediate;Wet Vocal Quality;Multiple swallows    Nectar Thick Nectar Thick Liquid: Not tested   Honey Thick Honey Thick Liquid: Not tested   Puree Puree: Impaired Presentation: Spoon Oral Phase Functional Implications: Prolonged oral transit Pharyngeal Phase Impairments: Suspected delayed Swallow;Throat Clearing - Delayed;Multiple swallows   Solid   GO    Solid: Not tested      Herbie Baltimore, MA CCC-SLP [phone]   Lynann Beaver 07/10/2014,9:35 AM

## 2014-07-10 NOTE — Progress Notes (Signed)
ID: [name] is a 69 y/o male with a hx of AVR with St Jude valve. He underwent multiple dental extractions in preparation for an AVR revision due to malfunctioning valve. He subsequently went into VT arrest. Blood cultures reveal enterococcal bacteremia and he continues on Ampicillin and Ceftriaxone.   Subjective: remains afebrile, still oozing from recent teeth extraction  Objective: Vital signs in last 24 hours: Temp:  [97.8 F (36.6 C)-98.7 F (37.1 C)] 97.8 F (36.6 C) (06/14 0828) Pulse Rate:  [81-107] 86 (06/14 1200) Resp:  [17-28] 18 (06/14 1200) BP: (144-197)/(90-126) 163/114 mmHg (06/14 1200) SpO2:  [90 %-100 %] 95 % (06/14 1200) Weight:  [104.8 kg (231 lb 0.7 oz)] 104.8 kg (231 lb 0.7 oz) (06/14 0600)  Intake/Output from previous day: 06/13 0701 - 06/14 0700 In: 485.6 [I.V.:235.6; IV Piggyback:250] Out: 640 [Urine:640] Intake/Output this shift: Total I/O In: 110 [P.O.:50; I.V.:60] Out: 16 [Urine:60] gen = sitting in chair in NAD HEENT = edematous gum line line from teeth extraction pulm = CTAB, decrease at bases Cors = irreg, irreg, + diastolic murmur Skin = warm ,dry, no cyanosis Ext= no edema  Lab Results  Recent Labs  07/08/14 0400 07/09/14 0500 07/10/14 0420  WBC 8.6 7.7 6.9  HGB 8.4* 8.6* 8.3*  HCT 26.3* 27.1* 26.4*  NA 134*  --  141  K 4.1  --  4.0  CL 104  --  109  CO2 23  --  24  BUN 22*  --  18  CREATININE 2.18*  --  1.52*    Microbiology: Recent Results (from the past 240 hour(s))  MRSA PCR Screening     Status: None   Collection Time: 06/28/2014  5:00 PM  Result Value Ref Range Status   MRSA by PCR NEGATIVE NEGATIVE Final    Comment:        The GeneXpert MRSA Assay (FDA approved for NASAL specimens only), is one component of a comprehensive MRSA colonization surveillance program. It is not intended to diagnose MRSA infection nor to guide or monitor treatment for MRSA infections.   Culture, blood (routine x 2)     Status: None    Collection Time: 07/06/14  4:20 PM  Result Value Ref Range Status   Specimen Description BLOOD CENTRAL LINE  Final   Special Requests BOTTLES DRAWN AEROBIC ONLY 10CC  Final   Culture   Final    ENTEROCOCCUS SPECIES Note: SUSCEPTIBILITIES PERFORMED ON PREVIOUS CULTURE WITHIN THE LAST 5 DAYS. Note: Gram Stain Report Called to,Read Back By and Verified With: A AFATSAWO 07/07/14 1055 BY [email or web address] Performed at [facility]    Report Status 07/09/2014 FINAL  Final  Culture, blood (routine x 2)     Status: None   Collection Time: 07/06/14  4:25 PM  Result Value Ref Range Status   Specimen Description BLOOD CENTRAL LINE  Final   Special Requests BOTTLES DRAWN AEROBIC ONLY 10CC  Final   Culture   Final    ENTEROCOCCUS SPECIES Note: Gram Stain Report Called to,Read Back By and Verified With: A AFATSAWO 07/07/14 1055 BY SMITHERSJ Performed at [facility]    Report Status 07/09/2014 FINAL  Final   Organism ID, Bacteria ENTEROCOCCUS SPECIES  Final      Susceptibility   Enterococcus species - MIC*    AMPICILLIN <=2 SENSITIVE Sensitive     VANCOMYCIN 2 SENSITIVE Sensitive     * ENTEROCOCCUS SPECIES  Culture, blood (routine x 2)     Status:  None (Preliminary result)   Collection Time: 07/09/14  4:20 PM  Result Value Ref Range Status   Specimen Description BLOOD LEFT FOOT  Final   Special Requests BOTTLES DRAWN AEROBIC ONLY 10CC  Final   Culture   Final           BLOOD CULTURE RECEIVED NO GROWTH TO DATE CULTURE WILL BE HELD FOR 5 DAYS BEFORE ISSUING A FINAL NEGATIVE REPORT Performed at [facility]    Report Status PENDING  Incomplete  Culture, blood (routine x 2)     Status: None (Preliminary result)   Collection Time: 07/09/14  4:30 PM  Result Value Ref Range Status   Specimen Description BLOOD RIGHT FOOT  Final   Special Requests BOTTLES DRAWN AEROBIC ONLY 3CC  Final   Culture   Final           BLOOD CULTURE RECEIVED NO GROWTH TO DATE CULTURE WILL BE HELD FOR 5  DAYS BEFORE ISSUING A FINAL NEGATIVE REPORT Performed at [facility]    Report Status PENDING  Incomplete    Assessment/Plan: Enterococcus bacteremia = thought to only be isolated from picc line, that has now been removed. Due to concern for risk to his presnt prosthetic valve, will treat as simple bacteremia with 2 wks of ampicillin. Will adjust dose due to improved cr clearance. Will D/C Ceftriaxone. Use 6/13 as day 1 of 14.    LOS: 5 days    Tilden Fossa 07/10/2014, 12:26 PM   i have interviewed, examined patient with Ms. O'Neil. I agree with assessment and plan as outlined above.  Elzie Rings [place] for Infectious Diseases [phone]

## 2014-07-10 NOTE — Procedures (Cosign Needed)
Objective Swallowing Evaluation:  (FEES)  Patient Details  Name: [name] MRN: [id number] Date of Birth: 1945-11-17  Today's Date: 07/10/2014 Time: SLP Start Time (ACUTE ONLY): 1350-SLP Stop Time (ACUTE ONLY): 1415 SLP Time Calculation (min) (ACUTE ONLY): 25 min  Past Medical History:  Past Medical History  Diagnosis Date  . Chest pain   . Hypertension   . Aortic aneurysm 1993  . 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 Pneumonia   . Aortic valve replaced      05/2014 transfered from [place] to [facility] for HF in the setting of failed valve repair. 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 CT surgery considering AVR, delayed due to GI bleed  . Hx of aortic aneurysm repair 06/14/2014  . Atrial fibrillation 05/2014  . Gastritis and gastroduodenitis with hemorrhage 05/2014    severe.   . Tubular adenoma of colon 12/2012  . DVT of upper extremity (deep vein thrombosis) 05/2014    left  . Acute blood loss anemia 05/2014   . CAD (coronary artery disease)     minimal CAD on cath 05/2014   Past Surgical History:  Past Surgical History  Procedure Laterality Date  . Abdominal aortic aneurysm repair  1993    At [facility], Unspecified  . Splenectomy    . Cardiac valve replacement      At [facility]  . Esophagogastroduodenoscopy  Aug 2014    [facility]: large duodenal ulcer with visible vessel, s/p clip and epi, erosive gastritis. +h.pylori serology, treated with amoxicillin and biaxin.   . Colonoscopy N/A 12/27/2012    Procedure: COLONOSCOPY;  Surgeon: Danie Binder, MD;  [facility];  Service: Endoscopy;  Laterality: N/A;  8:45-moved to 855   . Tee without cardioversion N/A 06/15/2014    Procedure: TRANSESOPHAGEAL ECHOCARDIOGRAM (TEE);  Surgeon: Sueanne Margarita, MD;  Location: [facility] ENDOSCOPY;  Service: Cardiovascular;  Laterality: N/A;  . Cardiac catheterization N/A 06/15/2014    Procedure: Fluoroscopy Guidance;  Surgeon: Josue Hector, MD;   Location: [place] CV LAB;  Service: Cardiovascular;  Laterality: N/A;  . Esophagogastroduodenoscopy N/A 06/18/2014    Procedure: ESOPHAGOGASTRODUODENOSCOPY (EGD);  Surgeon: Jerene Bears, MD;  Location: [facility] ENDOSCOPY;  Service: Endoscopy;  Laterality: N/A;  . Peripheral vascular catheterization  06/19/2014    Procedure: Aortic Arch Angiography;  Surgeon: Jettie Booze, MD;  Location: [place] CV LAB;  Service: Cardiovascular;;  . Esophagogastroduodenoscopy N/A 06/21/2014    Procedure: ESOPHAGOGASTRODUODENOSCOPY (EGD);  Surgeon: Jerene Bears, MD;  Location: [facility] ENDOSCOPY;  Service: Endoscopy;  Laterality: N/A;  . Esophagogastroduodenoscopy N/A 06/21/2014    Procedure: ESOPHAGOGASTRODUODENOSCOPY (EGD);  Surgeon: Jerene Bears, MD;  Location: [place];  Service: Gastroenterology;  Laterality: N/A;  . Multiple extractions with alveoloplasty N/A 07/02/2014    Procedure: Extraction of tooth #'s 3,4,5,13,14,21,23,24,25,26, with alveoloplasty and gross debridement of remaining teeth.;  Surgeon: [name], DDS;  Location: [place];  Service: Oral Surgery;  Laterality: N/A;   HPI:  Other Pertinent Information: 69 y.o. 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 Plan to have AVR redo by Dr Nils Pyle at the end of June. He underwent Multiple tooth extractions on 07/10/2014. Post op complicated by VT arrest requiring ~  2 min of CPR. Intubated 07/06/14. Extubated 07/08/14.Continues to have copious oral bleeding, pt is on heparin.   No Data Recorded  Assessment / Plan / Recommendation CHL IP CLINICAL IMPRESSIONS 07/10/2014  Therapy Diagnosis Severe pharyngeal phase dysphagia  Clinical Impression Pt presents with a severe oropharyngeal dysphagia with  generalized weakness and deconditioning impacting bolus transit and secretion management. Pt has bloody standing secretions in the glottis and lateral channels with no initiation to clear. With max cues pt will cough and expectorate mucous. Swallow initiation with PO is delayed and hyolaryngeal mobility is generally weak. 50% of boluses remain the oropharynx and mix with standing secretions and are silently aspirated despite max cues for second swallows. After several trials pt expectorates stasis with a hard cough. Overall, oropharyngeal swallow is not functional due to weakness, secretions and decreased awareness and initiation. Recommend pt be NPO until there is obsevable improvement in secretion management.       CHL IP TREATMENT RECOMMENDATION 07/10/2014  Treatment Recommendations Therapy as outlined in treatment plan below     CHL IP DIET RECOMMENDATION 07/10/2014  SLP Diet Recommendations NPO;Alternative means - temporary  Liquid Administration via (None)  Medication Administration (None)  Compensations (None)  Postural Changes and/or Swallow Maneuvers (None)     CHL IP OTHER RECOMMENDATIONS 07/10/2014  Recommended Consults (None)  Oral Care Recommendations Oral care QID  Other Recommendations (None)     No flowsheet data found.   CHL IP FREQUENCY AND DURATION 07/10/2014  Speech Therapy Frequency (ACUTE ONLY) min 2x/week  Treatment Duration 2 weeks     Pertinent Vitals/Pain NA    SLP Swallow Goals No flowsheet data found.  No flowsheet data found.    CHL IP REASON FOR REFERRAL 07/10/2014  Reason for Referral Objectively evaluate swallowing function     CHL IP ORAL PHASE 07/10/2014  Lips (None)  Tongue (None)  Mucous membranes (None)  Nutritional status (None)  Other (None)  Oxygen therapy (None)  Oral Phase Impaired  Oral - Pudding Teaspoon (None)  Oral - Pudding Cup (None)  Oral - Honey Teaspoon (None)  Oral - Honey Cup (None)  Oral - Honey Syringe (None)  Oral -  Nectar Teaspoon (None)  Oral - Nectar Cup (None)  Oral - Nectar Straw (None)  Oral - Nectar Syringe (None)  Oral - Ice Chips (None)  Oral - Thin Teaspoon (None)  Oral - Thin Cup (None)  Oral - Thin Straw (None)  Oral - Thin Syringe (None)  Oral - Puree (None)  Oral - Mechanical Soft (None)  Oral - Regular (None)  Oral - Multi-consistency (None)  Oral - Pill (None)  Oral Phase - Comment (None)      CHL IP PHARYNGEAL PHASE 07/10/2014  Pharyngeal Phase Impaired  Pharyngeal - Pudding Teaspoon (None)  Penetration/Aspiration details (pudding teaspoon) (None)  Pharyngeal - Pudding Cup (None)  Penetration/Aspiration details (pudding cup) (None)  Pharyngeal - Honey Teaspoon (None)  Penetration/Aspiration details (honey teaspoon) (None)  Pharyngeal - Honey Cup (None)  Penetration/Aspiration details (honey cup) (None)  Pharyngeal - Honey Syringe (None)  Penetration/Aspiration details (honey syringe) (None)  Pharyngeal - Nectar Teaspoon (None)  Penetration/Aspiration details (nectar teaspoon) (None)  Pharyngeal - Nectar Cup (None)  Penetration/Aspiration details (nectar cup) (None)  Pharyngeal - Nectar Straw (None)  Penetration/Aspiration details (nectar straw) (None)  Pharyngeal - Nectar Syringe (None)  Penetration/Aspiration details (nectar syringe) (None)  Pharyngeal - Ice Chips (None)  Penetration/Aspiration details (ice chips) (None)  Pharyngeal - Thin Teaspoon (None)  Penetration/Aspiration details (  thin teaspoon) (None)  Pharyngeal - Thin Cup (None)  Penetration/Aspiration details (thin cup) (None)  Pharyngeal - Thin Straw (None)  Penetration/Aspiration details (thin straw) (None)  Pharyngeal - Thin Syringe (None)  Penetration/Aspiration details (thin syringe') (None)  Pharyngeal - Puree (None)  Penetration/Aspiration details (puree) (None)  Pharyngeal - Mechanical Soft (None)  Penetration/Aspiration details (mechanical soft) (None)  Pharyngeal - Regular (None)   Penetration/Aspiration details (regular) (None)  Pharyngeal - Multi-consistency (None)  Penetration/Aspiration details (multi-consistency) (None)  Pharyngeal - Pill (None)  Penetration/Aspiration details (pill) (None)  Pharyngeal Comment (None)      No flowsheet data found.  No flowsheet data found.        Herbie Baltimore, [place] CCC-SLP [phone]  Lynann Beaver 07/10/2014, 2:38 PM

## 2014-07-10 NOTE — Progress Notes (Signed)
Physical Therapy Treatment Patient Details Name: [name] MRN: [id number] DOB: 02-13-1945 Today's Date: 07/10/2014    History of Present Illness 69 y.o. 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 Plan to have AVR redo by Dr Nils Pyle at the end of June. He underwent Multiple tooth extractions on 07/26/2014.  Post op complicated by VT arrest requiring ~ 2 min of CPR. Intubated 07/06/14.  Extubated 07/08/14.    PT Comments    Pt familiar from prior admission and continues to benefit from encouragement to maximize activity and function. Pt able to transfer OOB today and begin ambulation. Encouraged increased mobility with nursing and HEP. Will continue to follow.   Follow Up Recommendations  CIR     Equipment Recommendations       Recommendations for Other Services       Precautions / Restrictions Precautions Precautions: Fall    Mobility  Bed Mobility Overal bed mobility: Needs Assistance   Rolling: Min guard Sidelying to sit: Min guard;HOB elevated       General bed mobility comments: cues for sequence, use of rail but no physical assist with increased time and good use of reciprocal scooting to EOB  Transfers Overall transfer level: Needs assistance   Transfers: Sit to/from Stand Sit to Stand: Mod assist;+2 physical assistance         General transfer comment: mod assist from bed and chair with cues for hand placement, foot positioning and anterior weight shift. trial from chair with less assist than from bed  Ambulation/Gait Ambulation/Gait assistance: Min assist;+2 safety/equipment Ambulation Distance (Feet): 15 Feet Assistive device: Rolling walker (2 wheeled) Gait Pattern/deviations: Step-through  pattern;Decreased stride length;Trunk flexed   Gait velocity interpretation: Below normal speed for age/gender General Gait Details: cues for posture, position in RW  with chair pulled behind pt and encouragement to increase distance. pt leaning left with cues to correct and tactile assist   Stairs            Wheelchair Mobility    Modified Rankin (Stroke Patients Only)       Balance Overall balance assessment: Needs assistance   Sitting balance-Leahy Scale: Good       Standing balance-Leahy Scale: Poor                      Cognition Arousal/Alertness: Awake/alert Behavior During Therapy: Flat affect Overall Cognitive Status: Within Functional Limits for tasks assessed                 General Comments: slow to answer questions and slow to move    Exercises      General Comments        Pertinent Vitals/Pain Pain Score: 3  Pain Location: mouth and bil LE Pain Descriptors / Indicators: Aching Pain Intervention(s): Repositioned  HR 95-125 with gait sats 96% on RA    Home Living                      Prior Function            PT Goals (current goals can now be found in the care plan section) Progress towards PT goals: Progressing toward goals    Frequency       PT Plan Current  plan remains appropriate    Co-evaluation             End of Session Equipment Utilized During Treatment: Gait belt Activity Tolerance: Patient tolerated treatment well Patient left: in chair;with call bell/phone within reach;with chair alarm set;with nursing/sitter in room     Time: 0820-0849 PT Time Calculation (min) (ACUTE ONLY): 29 min  Charges:  $Gait Training: 8-22 mins $Therapeutic Activity: 8-22 mins                    G Codes:      [name] 07-27-14, 8:57 AM [name], [place]

## 2014-07-10 NOTE — Progress Notes (Signed)
Procedure(s) (LRB): BENTALL PROCEDURE (N/A) REDO STERNOTOMY (N/A) TRANSESOPHAGEAL ECHOCARDIOGRAM (TEE) (N/A) Subjective: The patient has had an eventful course following dental extraction 5 days ago of several necrotic teeth prior to redo aVR. The patient is now 4 days post V. tach arrest requiring cardioversion. He is extubated. However he has not had any physical therapy and is now weaker. He has had no nutrition for the past few days because of his dental status. He now appears to have signs of aspiration and is undergoing evaluation by speech therapy.  Significantly he was found to have positive blood cultures with enterococcal bacteremia. He is placed on ampicillin and ceftriaxone and repeat blood cultures are pending. The etiology of the bacteremia is probably related to a central line in the neck which has been removed and replaced with a PICC line. He is in chronic atrial fibrillation. He is receiving IV heparin per pharmacy for his mechanical valve and atrial fib.  Objective: Vital signs in last 24 hours: Temp:  [97.8 F (36.6 C)-98.7 F (37.1 C)] 97.8 F (36.6 C) (06/14 1250) Pulse Rate:  [81-107] 86 (06/14 1200) Cardiac Rhythm:  [-] Atrial fibrillation (06/14 0800) Resp:  [17-28] 18 (06/14 1200) BP: (144-197)/(90-126) 163/114 mmHg (06/14 1200) SpO2:  [90 %-100 %] 95 % (06/14 1200) Weight:  [231 lb 0.7 oz (104.8 kg)] 231 lb 0.7 oz (104.8 kg) (06/14 0600)  Hemodynamic parameters for last 24 hours:   blood pressure stable afebrile  Intake/Output from previous day: 06/13 0701 - 06/14 0700 In: 485.6 [I.V.:235.6; IV Piggyback:250] Out: 640 [Urine:640] Intake/Output this shift: Total I/O In: 110 [P.O.:50; I.V.:60] Out: 60 [Urine:60]  Exam Patient up in chair spitting bloody oral secretions Breath sounds diminished Overall generally weak with wet cough  Lab Results:  Recent Labs  07/09/14 0500 07/10/14 0420  WBC 7.7 6.9  HGB 8.6* 8.3*  HCT 27.1* 26.4*  PLT 242 313    BMET:  Recent Labs  07/08/14 0400 07/10/14 0420  NA 134* 141  K 4.1 4.0  CL 104 109  CO2 23 24  GLUCOSE 113* 100*  BUN 22* 18  CREATININE 2.18* 1.52*  CALCIUM 8.6* 9.4    PT/INR: No results for input(s): LABPROT, INR in the last 72 hours. ABG    Component Value Date/Time   PHART 7.371 07/08/2014 0815   HCO3 21.5 07/08/2014 0815   TCO2 22.7 07/08/2014 0815   ACIDBASEDEF 2.9* 07/08/2014 0815   O2SAT 98.9 07/08/2014 0815   CBG (last 3)   Recent Labs  07/09/14 0840 07/09/14 0847 07/09/14 1145  GLUCAP 68 81 83    Assessment/Plan: S/P Procedure(s) (LRB): BENTALL PROCEDURE (N/A) REDO STERNOTOMY (N/A) TRANSESOPHAGEAL ECHOCARDIOGRAM (TEE) (N/A) The patient's GI bleeding has resolved However his clinical status has significantly deteriorated following the minor stress of dental extraction. He is currently not a candidate for redo aortic valve and aortic root replacement. The patient will need 2 weeks of IV antibiotic therapy  because of bacteremia and better physical conditioning with more PT prior to his cardiac surgery. If possible transfer back to the inpatient rehabilitation service would be optimal to again prepare him for surgery which he states he is still interested in having. Will follow.   LOS: 5 days    Tharon Aquas Trigt III 07/10/2014

## 2014-07-10 NOTE — Progress Notes (Signed)
Dr. Sherral Hammers notified of pts B/P

## 2014-07-10 NOTE — Progress Notes (Addendum)
PHARMACY NOTE  Pharmacy Consult :  69 y.o. male is currently on Heparin infusion for DVT, mechanical AV thrombus, and Afib.  Dosing Wt :  91.6 kg  LABS :  Recent Labs  07/07/14 0400 07/07/14 1113 07/08/14 0243 07/08/14 0400 07/08/14 1823 07/09/14 0130 07/09/14 0500 07/09/14 1620  HGB 8.8*  --   --  8.4*  --   --  8.6*  --   HCT 28.3*  --   --  26.3*  --   --  27.1*  --   PLT 286  --   --  262  --   --  242  --   HEPARINUNFRC 0.45 0.50 0.70  --  0.61 0.66  --  0.51  CREATININE 2.22*  --   --  2.18*  --   --   --   --     MEDICATION: Infusion[s]: Infusions:    ASSESSMENT : 69 YOM is currently on Heparin infusion for DVT, mechanical AV thrombus, and Afib. He had multiple dental extractions on 6/9, oozing/bleeding from mouth continues, plan to hold heparin for 7 hours, then restart tonight 6/14. Obtained heparin level through foot stick at 1500 before stopping heparin (stopped at 1628). The heparin level is 0.22, subtherapeutic   GOAL : Heparin Level  0.3 - 0.5 units/ml  PLAN : - Hold heparin now - Restart heparin 1050 units/hr at 2330 with no bolus - f/u 8 hr heparin level at 0800  Maryanna Shape, PharmD, BCPS  Clinical Pharmacist  Pager: [phone]   07/10/2014,  3:20 PM

## 2014-07-11 ENCOUNTER — Inpatient Hospital Stay (HOSPITAL_COMMUNITY): Payer: Commercial Managed Care - HMO

## 2014-07-11 DIAGNOSIS — Z01818 Encounter for other preprocedural examination: Secondary | ICD-10-CM

## 2014-07-11 DIAGNOSIS — Z7901 Long term (current) use of anticoagulants: Secondary | ICD-10-CM

## 2014-07-11 DIAGNOSIS — I350 Nonrheumatic aortic (valve) stenosis: Secondary | ICD-10-CM

## 2014-07-11 LAB — COMPREHENSIVE METABOLIC PANEL
ALT: 10 U/L — AB (ref 17–63)
AST: 21 U/L (ref 15–41)
Albumin: 2.6 g/dL — ABNORMAL LOW (ref 3.5–5.0)
Alkaline Phosphatase: 46 U/L (ref 38–126)
Anion gap: 12 (ref 5–15)
BUN: 18 mg/dL (ref 6–20)
CO2: 23 mmol/L (ref 22–32)
Calcium: 9.2 mg/dL (ref 8.9–10.3)
Chloride: 108 mmol/L (ref 101–111)
Creatinine, Ser: 1.58 mg/dL — ABNORMAL HIGH (ref 0.61–1.24)
GFR calc Af Amer: 51 mL/min — ABNORMAL LOW (ref >60)
GFR, EST NON AFRICAN AMERICAN: 44 mL/min — AB (ref >60)
Glucose, Bld: 101 mg/dL — ABNORMAL HIGH (ref 65–99)
Potassium: 3.7 mmol/L (ref 3.5–5.1)
SODIUM: 143 mmol/L (ref 135–145)
TOTAL PROTEIN: 6.9 g/dL (ref 6.5–8.1)
Total Bilirubin: 1 mg/dL (ref 0.3–1.2)

## 2014-07-11 LAB — CBC WITH DIFFERENTIAL/PLATELET
Basophils Absolute: 0.1 10*3/uL (ref 0.0–0.1)
Basophils Relative: 1 % (ref 0–1)
EOS ABS: 0.2 10*3/uL (ref 0.0–0.7)
Eosinophils Relative: 2 % (ref 0–5)
HCT: 28.1 % — ABNORMAL LOW (ref 39.0–52.0)
Hemoglobin: 8.6 g/dL — ABNORMAL LOW (ref 13.0–17.0)
Lymphocytes Relative: 14 % (ref 12–46)
Lymphs Abs: 0.9 10*3/uL (ref 0.7–4.0)
MCH: 26.8 pg (ref 26.0–34.0)
MCHC: 30.6 g/dL (ref 30.0–36.0)
MCV: 87.5 fL (ref 78.0–100.0)
MONOS PCT: 20 % — AB (ref 3–12)
Monocytes Absolute: 1.3 10*3/uL — ABNORMAL HIGH (ref 0.1–1.0)
Neutro Abs: 4.2 10*3/uL (ref 1.7–7.7)
Neutrophils Relative %: 63 % (ref 43–77)
PLATELETS: 316 10*3/uL (ref 150–400)
RBC: 3.21 MIL/uL — AB (ref 4.22–5.81)
RDW: 17.8 % — ABNORMAL HIGH (ref 11.5–15.5)
WBC: 6.6 10*3/uL (ref 4.0–10.5)

## 2014-07-11 LAB — CARBOXYHEMOGLOBIN
Carboxyhemoglobin: 1.3 % (ref 0.5–1.5)
Methemoglobin: 0.9 % (ref 0.0–1.5)
O2 Saturation: 50.7 %
Total hemoglobin: 8.9 g/dL — ABNORMAL LOW (ref 13.5–18.0)

## 2014-07-11 LAB — HEPARIN LEVEL (UNFRACTIONATED)
Heparin Unfractionated: <0.1 IU/mL — ABNORMAL LOW (ref 0.30–0.70)
Heparin Unfractionated: 0.21 IU/mL — ABNORMAL LOW (ref 0.30–0.70)

## 2014-07-11 LAB — MAGNESIUM: Magnesium: 1.7 mg/dL (ref 1.7–2.4)

## 2014-07-11 MED ORDER — METOPROLOL TARTRATE 1 MG/ML IV SOLN
10.0000 mg | INTRAVENOUS | Status: DC
  Administered 2014-07-11 – 2014-07-14 (×16): 10 mg via INTRAVENOUS
  Filled 2014-07-11 (×24): qty 10

## 2014-07-11 MED ORDER — HYDRALAZINE HCL 20 MG/ML IJ SOLN
10.0000 mg | Freq: Four times a day (QID) | INTRAMUSCULAR | Status: DC
  Administered 2014-07-11: 10 mg via INTRAVENOUS

## 2014-07-11 MED ORDER — METOPROLOL TARTRATE 1 MG/ML IV SOLN: 10.0000 mg | INTRAVENOUS | Status: DC

## 2014-07-11 MED ORDER — METOPROLOL TARTRATE 1 MG/ML IV SOLN: 5.0000 mg | INTRAVENOUS | Status: DC

## 2014-07-11 MED ORDER — CHLORHEXIDINE GLUCONATE 0.12 % MT SOLN
15.0000 mL | Freq: Two times a day (BID) | OROMUCOSAL | Status: DC
  Administered 2014-07-11 – 2014-07-23 (×22): 15 mL via OROMUCOSAL
  Filled 2014-07-11 (×24): qty 15

## 2014-07-11 MED ORDER — MORPHINE SULFATE 2 MG/ML IJ SOLN
1.0000 mg | INTRAMUSCULAR | Status: DC | PRN
  Administered 2014-07-13 (×2): 1 mg via INTRAVENOUS
  Administered 2014-07-13 – 2014-07-14 (×4): 2 mg via INTRAVENOUS
  Filled 2014-07-11 (×6): qty 1

## 2014-07-11 MED ORDER — OSMOLITE 1.2 CAL PO LIQD
1000.0000 mL | ORAL | Status: DC
  Administered 2014-07-11 – 2014-07-13 (×2): 1000 mL
  Filled 2014-07-11 (×4): qty 1000

## 2014-07-11 NOTE — Progress Notes (Signed)
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 [name] who presented to [facility] on 06/11/2014 complaining of SOB. TEE showed severe AI with failed St Jude Mechanical valve after patient stopped his coumadin for unknown reason for 3 months and valve thrombosed. Also noted to be in a-fib. Evaluated by CT surgery for consideration of high risk AVR. Cath showed mild CAD however the operator could not visualize the distal RCA system. Valve surgery had to be delayed due to recurrent GI bleeding treated with EGDx2 and clipping. The pt was d/c to CIR on 6/3 to max out physical therapy before high risk AVR. In preparation for surgery he underwent dental extraction on 6/10.  He tolerated the procedure well, but the following day he suffered a VT arrest requiring CPR, epi, and intubation.  HPI/Subjective: The patient is in bed with an emesis basin frequently spitting out mildly blood-tinged saliva.  He asked he denies any new complaints at the present time.  There is no evidence of frank significant bleeding from the mouth.  He denies chest pain or shortness of breath but does admit that he experiences difficulty swallowing.  I have discussed the need to provide him a nutrition and he agrees to have a small nasogastric feeding tube placed.  Assessment/Plan:  In-hospital VT arrest/cardiac arrest -rapidly recovered after CPR initiated, intubated, and epi given (defib was not immediately available)  Thrombosed St. Jude aortic valve -due to noncompliance w/ warfarin anticoag  -CT surgery states patient is not a candidate for valve replacement until he has completed 2 weeks of antibiotics and a course of physical therapy at CIR  Noncompliant  with Coumadin therapy for 3+ months -See thrombosed aortic valve  LUE DVT -Heparin per pharmacy - exam stable   Episodic Uncontrolled HTN -blood pressure is quite variable with systolics as low as 706 and as high as 213 - this is likely reflective of his malfunctioning aortic valve - gently adjust medical therapy and follow overall trend  Chronic atrial fibrillation -CHA2DS2-Vasc score of 3 but has 2 other very serious indications for ongoing anticog (thrombosed AVR, UE DVT) - continue heparin unless clear life-threatening bleed -Rate controlled    Acute on chronic renal failure -Cr is stable - follow   Severe pharyngeal phase dysphagia -due to weakness, secretions and decreased awareness and initiation - nutrition is key in preparing patient for eventual life-saving valve surgery - place NG feeding tube today and initiate feeding supplement - SLP to reevaluate intermittently  Hematemesis -due to bleeding from teeth extraction - presently resolved with only pink saliva being spit out - follow  Enterococcal bacteremia -thought to only have been isolated from CVL even though 2 cultures were sent (both from CVL?) -empirically treating as a simple bacteremia w/ planned 2 weeks of antibiotics  -CVL was removed and PICC placed, apparently on same day  -f/u blood cx (6/13) thus far no growth   Recent GI bleed -Hemoglobin stable  H/o Aortic dissection  Hematuria  Code Status: FULL Family Communication: no family present at time of exam Disposition Plan: Resolution of bacteremia - CIR  Consultants: Dr.[name] (PCCM) Dr.[name] (cardiology) Dr.[name] (infectious disease) DDS [name] (dental) Dr.Peter Lucianne Lei  Trigt (cardiothoracic surgery)  Procedure/Significant Events: 5/20 TEE EF 55% to 60% St. Jude Mechanical AVR is present. Only one of the leaflets visualized is moving; other leaflet is immobile -severe aortic insufficiency - Aorta: aortic  dissection flap in descending thoracic aorta that extends up to the arch - Mitral valve: moderate regurgitation - Left atrium: severely dilated. No thrombus in the atrial cavity or appendage - Right atrium: moderately dilated. Nothrombus in the atrial cavity or appendage -Tricuspid valve: moderate regurgitation.  5/24 upper extremity Doppler acute deep vein thrombosis involving axillary and brachial veins LUE -Superficial thrombosis in Basilic vein.  Antibiotics: Ampicillin 6/12 > Ceftriaxone 6/12 >  DVT prophylaxis: Heparin  Objective: Blood pressure 200/135, pulse 89, temperature 98.5 F (36.9 C), temperature source Oral, resp. rate 20, height 5\' 8"  (1.727 m), weight 104.4 kg (230 lb 2.6 oz), SpO2 96 %.  Intake/Output Summary (Last 24 hours) at 07/11/14 0834 Last data filed at 07/11/14 0700  Gross per 24 hour  Intake 638.83 ml  Output    565 ml  Net  73.83 ml   Exam: General: No acute respiratory distress - alert and oriented Lungs: Clear to auscultation bilaterally without wheezes or crackles Cardiovascular: Irregularly irregular with metallic click without appreciable rub or gallop Abdomen: Nontender, nondistended, soft, bowel sounds positive, no rebound, no ascites, no appreciable mass Extremities: No significant cyanosis, clubbing, or edema bilateral lower extremities - 2+ stable edema left upper extremity with 1+ radial pulse  Data Reviewed: Basic Metabolic Panel:  Recent Labs Lab 07/06/14 1440 07/07/14 0400 07/08/14 0400 07/10/14 0420 07/11/14 0310  NA 133* 136 134* 141 143  K 4.7 4.7 4.1 4.0 3.7  CL 103 106 104 109 108  CO2 21* 22 23 24 23   GLUCOSE 128* 90 113* 100* 101*  BUN 12 18 22* 18 18  CREATININE 1.46* 2.22* 2.18* 1.52* 1.58*  CALCIUM 8.8* 8.9 8.6* 9.4 9.2  MG 1.8 2.0 2.0  --  1.7  PHOS  --   --  4.2  --   --    Liver Function Tests:  Recent Labs Lab 07/06/14 0519 07/06/14 1440 07/07/14 0400 07/11/14 0310  AST 21 27 24 21   ALT 12* 14* 14*  10*  ALKPHOS 47 47 44 46  BILITOT 0.7 1.0 0.7 1.0  PROT 6.4* 6.7 6.1* 6.9  ALBUMIN 2.6* 2.7* 2.6* 2.6*   CBC:  Recent Labs Lab 07/07/14 0400 07/08/14 0400 07/09/14 0500 07/10/14 0420 07/11/14 0310  WBC 9.9 8.6 7.7 6.9 6.6  NEUTROABS  --   --   --   --  4.2  HGB 8.8* 8.4* 8.6* 8.3* 8.6*  HCT 28.3* 26.3* 27.1* 26.4* 28.1*  MCV 89.3 87.4 87.4 88.3 87.5  PLT 286 262 242 313 316   Cardiac Enzymes:  Recent Labs Lab 07/06/14 1440 07/06/14 2003 07/07/14 0202  TROPONINI 0.06* 0.07* 0.08*   BNP (last 3 results)  Recent Labs  06/14/14 2105  BNP 1438.6*   CBG:  Recent Labs Lab 07/09/14 0346 07/09/14 0840 07/09/14 0847 07/09/14 1145 07/10/14 1620  GLUCAP 83 68 81 83 102*    Recent Results (from the past 240 hour(s))  MRSA PCR Screening     Status: None   Collection Time: 07/16/2014  5:00 PM  Result Value Ref Range Status   MRSA by PCR NEGATIVE NEGATIVE Final    Comment:        The GeneXpert MRSA Assay (FDA approved for NASAL specimens only), is one component of a comprehensive MRSA  colonization surveillance program. It is not intended to diagnose MRSA infection nor to guide or monitor treatment for MRSA infections.   Culture, blood (routine x 2)     Status: None   Collection Time: 07/06/14  4:20 PM  Result Value Ref Range Status   Specimen Description BLOOD CENTRAL LINE  Final   Special Requests BOTTLES DRAWN AEROBIC ONLY 10CC  Final   Culture   Final    ENTEROCOCCUS SPECIES Note: SUSCEPTIBILITIES PERFORMED ON PREVIOUS CULTURE WITHIN THE LAST 5 DAYS. Note: Gram Stain Report Called to,Read Back By and Verified With: A AFATSAWO 07/07/14 1055 BY [email or web address] Performed at [facility]    Report Status 07/09/2014 FINAL  Final  Culture, blood (routine x 2)     Status: None   Collection Time: 07/06/14  4:25 PM  Result Value Ref Range Status   Specimen Description BLOOD CENTRAL LINE  Final   Special Requests BOTTLES DRAWN AEROBIC ONLY 10CC  Final    Culture   Final    ENTEROCOCCUS SPECIES Note: Gram Stain Report Called to,Read Back By and Verified With: A AFATSAWO 07/07/14 1055 BY SMITHERSJ Performed at [facility]    Report Status 07/09/2014 FINAL  Final   Organism ID, Bacteria ENTEROCOCCUS SPECIES  Final      Susceptibility   Enterococcus species - MIC*    AMPICILLIN <=2 SENSITIVE Sensitive     VANCOMYCIN 2 SENSITIVE Sensitive     * ENTEROCOCCUS SPECIES  Culture, blood (routine x 2)     Status: None (Preliminary result)   Collection Time: 07/09/14  4:20 PM  Result Value Ref Range Status   Specimen Description BLOOD LEFT FOOT  Final   Special Requests BOTTLES DRAWN AEROBIC ONLY 10CC  Final   Culture   Final           BLOOD CULTURE RECEIVED NO GROWTH TO DATE CULTURE WILL BE HELD FOR 5 DAYS BEFORE ISSUING A FINAL NEGATIVE REPORT Performed at [facility]    Report Status PENDING  Incomplete  Culture, blood (routine x 2)     Status: None (Preliminary result)   Collection Time: 07/09/14  4:30 PM  Result Value Ref Range Status   Specimen Description BLOOD RIGHT FOOT  Final   Special Requests BOTTLES DRAWN AEROBIC ONLY 3CC  Final   Culture   Final           BLOOD CULTURE RECEIVED NO GROWTH TO DATE CULTURE WILL BE HELD FOR 5 DAYS BEFORE ISSUING A FINAL NEGATIVE REPORT Performed at [facility]    Report Status PENDING  Incomplete     Studies:  Recent x-ray studies have been reviewed in detail by the Attending Physician  Scheduled Meds:  Scheduled Meds: . ampicillin (OMNIPEN) IV  2 g Intravenous 6 times per day  . cefTRIAXone (ROCEPHIN)  IV  2 g Intravenous Q12H  . ferrous sulfate  325 mg Oral Q breakfast  . metoprolol  5 mg Intravenous Q4H  . pantoprazole (PROTONIX) IV  40 mg Intravenous Q12H  . sodium chloride  10-40 mL Intracatheter Q12H    Time spent on care of this patient: 35 mins  Cherene Altes, MD [facility] For Consults/Admissions - Flow Manager - [phone] Office   [phone]  Contact MD directly via text page:      [email or web address]      password [facility]  07/11/2014, 8:34 AM   LOS: 6 days

## 2014-07-11 NOTE — Progress Notes (Signed)
[place] for Heparin  Indication: DVT (also mechanical AV thrombosis & Afib)  No Known Allergies  Patient Measurements: Height: 5\' 8"  (172.7 cm) Weight: 230 lb 2.6 oz (104.4 kg) IBW/kg (Calculated) : 68.4 Heparin dosing weight = 91.6 kg Vital Signs: Temp: 97.6 F (36.4 C) (06/15 1900) Temp Source: Oral (06/15 1900) BP: 132/96 mmHg (06/15 2000) Pulse Rate: 119 (06/15 2000)  Labs:  Recent Labs  07/09/14 0500  07/10/14 0420 07/10/14 1449 07/11/14 0310 07/11/14 0900 07/11/14 1755  HGB 8.6*  --  8.3*  --  8.6*  --   --   HCT 27.1*  --  26.4*  --  28.1*  --   --   PLT 242  --  313  --  316  --   --   HEPARINUNFRC  --   < > 1.05* 0.22*  --  <0.10* 0.21*  CREATININE  --   --  1.52*  --  1.58*  --   --   < > = values in this interval not displayed.  Estimated Creatinine Clearance: 53.1 mL/min (by C-G formula based on Cr of 1.58).   Assessment: 69 yo male with Afib/AVR, s/p tooth extraction last week.   Heparin held for most of yesterday d/t ongoing oral bleeding, restarted last night. Bleeding noted to be resolved, heparin level was undetectable and rate was increased. Not bolus d/t concern of restarting bleeding. CBC stable.   Heparin drip 1250 uts/hr HL 0.2 improved but still < goal.    Goal of Therapy:  Heparin level 0.3-0.5 Monitor platelets by anticoagulation protocol: Yes   Plan:  Increase heparin to 1400 units/hr Daily HL, CBC  Bonnita Nasuti Pharm.D. CPP, BCPS Clinical Pharmacist [phone] 07/11/2014 9:06 PM

## 2014-07-11 NOTE — Progress Notes (Signed)
I have discussed Dr. Lucianne Lei Trigt's plan of care with Dr. Letta Pate, inpatient rehabilitation Attending MD for this pt. The complexity of medical issues preclude pt from the ability to tolerate intense rehab, therefore pt not felt to be a candidate for readmission to inpt rehab. I contacted Dr. Lucianne Lei Trigt's office and Henrine Screws states he is on vacation today but I informed her that pt not a candidate for readmission.  I will contact Dr. Thereasa Solo, RN CM and SW as well as his daughter, [name]. I have informed pt at bedside. Please call me with any questions. [phone]

## 2014-07-11 NOTE — Progress Notes (Signed)
Speech Language Pathology Treatment: Dysphagia  Patient Details Name: [name] MRN: [id number] DOB: [birth year] Today's Date: 07/11/2014 Time: 5697-9480 SLP Time Calculation (min) (ACUTE ONLY): 12 min  Assessment / Plan / Recommendation Clinical Impression  Session concentrated on secretion management, cough initiation and skilled observation of thin and ice chips following po's. Pt with decreased vocal intensity and dysarthria. Trace bleeding on anterior gum (RN notified). Prior to entering room, pt heard coughing and expectoration of secretions into basin. PO trials resulted in signs of delayed swallow trigger, decreased palpable laryngeal elevation and delayed and frequent throat clearing, coughing. Min verbal cues for effortful cough. He is not ready to initiate po's (PANDA placed today and tube feedings to start soon per RN). ST to continue skilled treatment moving towards ability to initiate food/liquid.   HPI Other Pertinent Information: 69 y.o. 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 Plan to have AVR redo by Dr Nils Pyle at the end of June. He underwent Multiple tooth extractions on 07/02/2014. Post op complicated by VT arrest requiring ~ 2 min of CPR. Intubated 07/06/14. Extubated 07/08/14.Continues to have copious oral bleeding, pt is on heparin.    Pertinent Vitals Pain Assessment: No/denies pain  SLP Plan  Continue with current plan of care    Recommendations Diet recommendations: NPO Medication Administration: Via alternative means              Oral Care Recommendations: Oral care QID Follow up Recommendations: Skilled Nursing facility Plan: Continue with current plan of care    GO     Houston Siren 07/11/2014, 3:18 PM  Orbie Pyo Colvin Caroli.Ed [employer] [phone]

## 2014-07-11 NOTE — Progress Notes (Signed)
POST OPERATIVE NOTE: Post op day #5  07/11/2014 [name] [id number]  VITALS: BP 138/83 mmHg  Pulse 92  Temp(Src) 98.6 F (37 C) (Oral)  Resp 18  Ht 5\' 8"  (1.727 m)  Wt 230 lb 2.6 oz (104.4 kg)  BMI 35.00 kg/m2  SpO2 100%  LABS:  Lab Results  Component Value Date   WBC 6.6 07/11/2014   HGB 8.6* 07/11/2014   HCT 28.1* 07/11/2014   MCV 87.5 07/11/2014   PLT 316 07/11/2014   BMET    Component Value Date/Time   NA 143 07/11/2014 0310   K 3.7 07/11/2014 0310   CL 108 07/11/2014 0310   CO2 23 07/11/2014 0310   GLUCOSE 101* 07/11/2014 0310   BUN 18 07/11/2014 0310   CREATININE 1.58* 07/11/2014 0310   CALCIUM 9.2 07/11/2014 0310   GFRNONAA 44* 07/11/2014 0310   GFRAA 51* 07/11/2014 0310    Lab Results  Component Value Date   INR 1.37 06/21/2014   INR 1.67* 06/15/2014   No results [name] for: PTT   [name] is status post multiple dental extractions with alveoloplasty and gross debridement of remaining dentition in the OR as part of a pre-heart valve surgery dental protocol. Patient subsequently had a V. tach cardiac arrest the day after surgery. Patient with persistent postoperative bleeding secondary to heparin therapy. Dr. Tharon Aquas Trigt recently evaluated the patient and determined that further inpatient rehabilitation was required for his deconditioning prior to anticipated heart valve surgery in 2-4 weeks. Patient now seen to evaluate postop condition.  SUBJECTIVE: Patient continues to feel "fair".  EXAM: There is no sign of active bleeding, heme, or infection. Sutures are intact. Clots are present. Copious amounts of thick saliva are noted. Last unfractionated heparin level was 0.22. Heparin level for today is currently unavailable.  ASSESSMENT: Post operative course is consistent with dental procedures performed in the operating room with postoperative bleeding secondary to heparin therapy.   PLAN: 1. Restart Chlorhexidine gluconate rinses  twice daily after breakfast and at bedtime. 2. 4 x 4 gauze to bleeding sites as needed. 3. Advance diet as tolerated. 4. Brush teeth at least twice daily.   [name], DDS

## 2014-07-11 NOTE — Progress Notes (Signed)
[place] [name] for Heparin  Indication: DVT (also mechanical AV thrombosis & Afib)  No Known Allergies  Patient Measurements: Height: 5\' 8"  (172.7 cm) Weight: 230 lb 2.6 oz (104.4 kg) IBW/kg (Calculated) : 68.4 Heparin dosing weight = 91.6 kg Vital Signs: Temp: 98.5 F (36.9 C) (06/15 0825) Temp Source: Oral (06/15 0825) BP: 181/103 mmHg (06/15 1000) Pulse Rate: 100 (06/15 1000)  Labs:  Recent Labs  07/09/14 0500  07/10/14 0420 07/10/14 1449 07/11/14 0310 07/11/14 0900  HGB 8.6*  --  8.3*  --  8.6*  --   HCT 27.1*  --  26.4*  --  28.1*  --   PLT 242  --  313  --  316  --   HEPARINUNFRC  --   < > 1.05* 0.22*  --  <0.10*  CREATININE  --   --  1.52*  --  1.58*  --   < > = values in this interval not displayed.  Estimated Creatinine Clearance: 53.1 mL/min (by C-G formula based on Cr of 1.58).   Assessment: 69 yo male with Afib/AVR, s/p tooth extraction last week.   Heparin held for most of yesterday d/t ongoing oral bleeding, restarted last night. Bleeding noted to be resolved, heparin level undetectable will increase rate. Will not bolus d/t concern of restarting bleeding. CBC stable.  Goal of Therapy:  Heparin level 0.3-0.5 Monitor platelets by anticoagulation protocol: Yes   Plan:  Increase heparin to 1250 units/hr Check heparin level in 8 hours.    Erin Hearing PharmD., BCPS Clinical Pharmacist Pager [phone] 07/11/2014 10:32 AM

## 2014-07-12 ENCOUNTER — Inpatient Hospital Stay (HOSPITAL_COMMUNITY): Payer: Commercial Managed Care - HMO

## 2014-07-12 DIAGNOSIS — I330 Acute and subacute infective endocarditis: Secondary | ICD-10-CM | POA: Diagnosis present

## 2014-07-12 LAB — CBC WITH DIFFERENTIAL/PLATELET
BASOS ABS: 0.1 10*3/uL (ref 0.0–0.1)
Basophils Relative: 1 % (ref 0–1)
EOS PCT: 1 % (ref 0–5)
Eosinophils Absolute: 0.1 10*3/uL (ref 0.0–0.7)
HEMATOCRIT: 28.5 % — AB (ref 39.0–52.0)
Hemoglobin: 8.9 g/dL — ABNORMAL LOW (ref 13.0–17.0)
LYMPHS PCT: 14 % (ref 12–46)
Lymphs Abs: 1.1 10*3/uL (ref 0.7–4.0)
MCH: 27.1 pg (ref 26.0–34.0)
MCHC: 31.2 g/dL (ref 30.0–36.0)
MCV: 86.6 fL (ref 78.0–100.0)
MONO ABS: 1.5 10*3/uL — AB (ref 0.1–1.0)
Monocytes Relative: 20 % — ABNORMAL HIGH (ref 3–12)
Neutro Abs: 4.8 10*3/uL (ref 1.7–7.7)
Neutrophils Relative %: 64 % (ref 43–77)
Platelets: 310 10*3/uL (ref 150–400)
RBC: 3.29 MIL/uL — ABNORMAL LOW (ref 4.22–5.81)
RDW: 17.9 % — AB (ref 11.5–15.5)
WBC: 7.4 10*3/uL (ref 4.0–10.5)

## 2014-07-12 LAB — MAGNESIUM: MAGNESIUM: 1.8 mg/dL (ref 1.7–2.4)

## 2014-07-12 LAB — COMPREHENSIVE METABOLIC PANEL
ALBUMIN: 2.7 g/dL — AB (ref 3.5–5.0)
ALK PHOS: 44 U/L (ref 38–126)
ALT: 13 U/L — ABNORMAL LOW (ref 17–63)
AST: 21 U/L (ref 15–41)
Anion gap: 15 (ref 5–15)
BILIRUBIN TOTAL: 0.8 mg/dL (ref 0.3–1.2)
BUN: 22 mg/dL — ABNORMAL HIGH (ref 6–20)
CALCIUM: 9.2 mg/dL (ref 8.9–10.3)
CO2: 22 mmol/L (ref 22–32)
CREATININE: 1.59 mg/dL — AB (ref 0.61–1.24)
Chloride: 108 mmol/L (ref 101–111)
GFR, EST AFRICAN AMERICAN: 50 mL/min — AB (ref >60)
GFR, EST NON AFRICAN AMERICAN: 43 mL/min — AB (ref >60)
Glucose, Bld: 122 mg/dL — ABNORMAL HIGH (ref 65–99)
Potassium: 3.6 mmol/L (ref 3.5–5.1)
SODIUM: 145 mmol/L (ref 135–145)
Total Protein: 7 g/dL (ref 6.5–8.1)

## 2014-07-12 LAB — HEPARIN LEVEL (UNFRACTIONATED)
Heparin Unfractionated: 1.03 IU/mL — ABNORMAL HIGH (ref 0.30–0.70)
Heparin Unfractionated: 0.5 IU/mL (ref 0.30–0.70)

## 2014-07-12 LAB — CARBOXYHEMOGLOBIN
Carboxyhemoglobin: 1.3 % (ref 0.5–1.5)
Methemoglobin: 1 % (ref 0.0–1.5)
O2 Saturation: 89.1 %
Total hemoglobin: 9 g/dL — ABNORMAL LOW (ref 13.5–18.0)

## 2014-07-12 NOTE — Progress Notes (Signed)
Physical Therapy Treatment Patient Details Name: [name] MRN: [id number] DOB: Sep 21, 1945 Today's Date: 07/12/2014    History of Present Illness 69 y.o. 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 Plan to have AVR redo by Dr [name] at the end of June. He underwent Multiple tooth extractions on 07/04/2014.  Post op complicated by VT arrest requiring ~ 2 min of CPR. Intubated 07/06/14.  Extubated 07/08/14.    PT Comments    Pt able to increase gait distance slightly today. Perseverating on wanting food but does not exhibit understanding despite education of why he is at risk for aspiration. Pt with decreased motivation for mobility today with encouragement and reassurance given. Pt encouraged to be OOB throughout the day and continue HEP. Will follow.   Follow Up Recommendations  SNF     Equipment Recommendations  Rolling walker with 5" wheels;3in1 (PT)    Recommendations for Other Services       Precautions / Restrictions Precautions Precautions: Fall Restrictions Weight Bearing Restrictions: No    Mobility  Bed Mobility Overal bed mobility: Modified Independent             General bed mobility comments: increased time with rail   Transfers Overall transfer level: Needs assistance   Transfers: Sit to/from Stand Sit to Stand: Min assist;+2 physical assistance         General transfer comment: min assist with cues, hand placement and foot position from bed. Pt denied attempting second time from chair  Ambulation/Gait Ambulation/Gait assistance: Min assist;+2 safety/equipment Ambulation Distance (Feet): 23 Feet Assistive device: Rolling walker (2 wheeled) Gait Pattern/deviations: Step-through pattern;Decreased  stride length;Trunk flexed   Gait velocity interpretation: Below normal speed for age/gender General Gait Details: cues for posture, position in RW  with chair pulled behind pt and encouragement to increase distance. pt would not repeat additional trial   Stairs            Wheelchair Mobility    Modified Rankin (Stroke Patients Only)       Balance Overall balance assessment: Needs assistance   Sitting balance-Leahy Scale: Good       Standing balance-Leahy Scale: Poor                      Cognition Arousal/Alertness: Awake/alert Behavior During Therapy: Flat affect   Area of Impairment: Memory               General Comments: slow to answer questions and slow to move, pt states he recalls pulling out feeding tube but didnt' know what it was for, does not understand why he cant' eat, self limiting    Exercises General Exercises - Lower Extremity Long Arc Quad: AROM;Seated;Both;15 reps    General Comments        Pertinent Vitals/Pain Pain Assessment: No/denies pain  HR 84, sats 98% on RA BP 144/94    Home Living                      Prior Function            PT Goals (current goals can now be found in the care plan section) Progress towards PT goals: Progressing toward goals (slowly and pt self-limiting)    Frequency  PT Plan Discharge plan needs to be updated    Co-evaluation             End of Session Equipment Utilized During Treatment: Gait belt Activity Tolerance: Patient tolerated treatment well Patient left: in chair;with call bell/phone within [name];with chair alarm set     Time: 6754-4920 PT Time Calculation (min) (ACUTE ONLY): 30 min  Charges:  $Gait Training: 8-22 mins $Therapeutic Activity: 8-22 mins                    G Codes:      [name] July 25, 2014, 10:26 AM [name], [place]

## 2014-07-12 NOTE — Progress Notes (Signed)
[place] for Heparin  Indication: DVT (also mechanical AV thrombosis & Afib)  No Known Allergies  Patient Measurements: Height: 5\' 8"  (172.7 cm) Weight: 231 lb 7.7 oz (105 kg) IBW/kg (Calculated) : 68.4 Heparin dosing weight = 91.6 kg Vital Signs: Temp: 98.8 F (37.1 C) (06/16 0720) Temp Source: Oral (06/16 0720) BP: 168/119 mmHg (06/16 1100) Pulse Rate: 80 (06/16 1100)  Labs:  Recent Labs  07/10/14 0420  07/11/14 0310  07/11/14 1755 07/12/14 0400 07/12/14 0500 07/12/14 1025  HGB 8.3*  --  8.6*  --   --  8.9*  --   --   HCT 26.4*  --  28.1*  --   --  28.5*  --   --   PLT 313  --  316  --   --  310  --   --   HEPARINUNFRC 1.05*  < >  --   < > 0.21*  --  1.03* 0.50  CREATININE 1.52*  --  1.58*  --   --  1.59*  --   --   < > = values in this interval not displayed.  Estimated Creatinine Clearance: 52.9 mL/min (by C-G formula based on Cr of 1.59).   Assessment: 69 yo male with Afib/AVR, s/p tooth extraction last week.   Heparin held for most of 6/14 d/t ongoing oral bleeding this has resolved.  Numerous issues with lab draws as heparin is infusing through picc. HL this am was elevated, redraw is within therapeutic range (0.5) (drawn through picc after wasting). No further bleeding noted. CBC appears stable. Will reduce rate slightly as we are at upper limit of lower goal.  Goal of Therapy:  Heparin level 0.3-0.5 Monitor platelets by anticoagulation protocol: Yes   Plan:  Reduce heparin to 1350 units/hr Daily HL, CBC  Erin Hearing PharmD., BCPS Clinical Pharmacist Pager [phone] 07/12/2014 11:28 AM

## 2014-07-12 NOTE — Progress Notes (Signed)
Nutrition Follow-up  DOCUMENTATION CODES:  Obesity unspecified  INTERVENTION:   (RD to follow for diet advancement and/or initiation of nutrition support)   If pt starts enteral nutrition support, recommend:   Initiate Osmolite 1.2 @ 20 ml/hr and increase by 10 ml every 4 hours to goal rate of 65 ml/hr.    Tube feeding regimen provides 1872 kcal (100% of needs), 87 grams of protein, and 1279 ml of H2O.   NUTRITION DIAGNOSIS:  Inadequate oral intake related to dysphagia as evidenced by NPO status.  Ongoing  GOAL:  Patient will meet greater than or equal to 90% of their needs  Unmet  MONITOR:  Diet advancement, Labs, Weight trends, Skin, I & O's  REASON FOR ASSESSMENT:  Consult Enteral/tube feeding initiation and management  ASSESSMENT: 69 y/o M initially admitted to [facility] on 5/19 with SOB in the setting of AV insufficiency after developing a partially thrombosed mechanical AV after stopping coumadin for unclear reasons. He was scheduled for an AVR on 5/26 but developed recurrent melena. He underwent an EGD with clipping of gastric erosion on 5/26.  S/p Procedure(s) (LRB) on 07/10/14: BENTALL PROCEDURE (N/A) REDO STERNOTOMY (N/A) TRANSESOPHAGEAL ECHOCARDIOGRAM (TEE) (N/A)  Pt underwent multiple dental extractions on 06/30/2014.   Pt extubated on 07/08/14.   Per CTS note, pt has tested positive for enterococcal bactremia. ID following.   Pt s/p FEES which revealed severe oral phase dysphagia. SLP recommending temporary nutrition via alternative means. TF recommendations provided.  Pt discussed with RN, per RN pt pulled out his PANDA tube twice, stating "nobody told me what it is for" despite several educations. MD has been notified, awaiting further instructions for the feeding, with possibility of initiation of TPN.  Unable to have a conversation with the patient, pt appeared to be talking, but unable to understand. Will continue to monitor. Labs reviewed: Glu 101 -  122, BUN 22, Cr 1.59  Height:  Ht Readings from Last 1 Encounters:  06/27/2014 5\' 8"  (1.727 m)    Weight:  Wt Readings from Last 1 Encounters:  07/12/14 231 lb 7.7 oz (105 kg)    Ideal Body Weight:  70 kg  Wt Readings from Last 10 Encounters:  07/12/14 231 lb 7.7 oz (105 kg)  07/22/2014 226 lb (102.513 kg)  06/29/14 220 lb 0.3 oz (99.8 kg)  12/27/12 223 lb (101.152 kg)  11/30/12 223 lb 12.8 oz (101.515 kg)  01/13/10 236 lb (107.049 kg)    BMI:  Body mass index is 35.21 kg/(m^2).  Estimated Nutritional Needs:  Kcal:  1800-2000  Protein:  95-105 grams  Fluid:  1.8-2.0 L  Skin:  Reviewed, no issues  Diet Order:  Diet NPO time specified  EDUCATION NEEDS:  No education needs identified at this time   Intake/Output Summary (Last 24 hours) at 07/12/14 1248 Last data filed at 07/12/14 1200  Gross per 24 hour  Intake  972.9 ml  Output    700 ml  Net  272.9 ml    Last BM:  07/12/14  Michaeljohn Biss A. [place] Dietetic Intern Pager: [phone] 07/12/2014 1:04 PM

## 2014-07-12 NOTE — Progress Notes (Signed)
Utilization review complete. Imagene Boss RN CCM Case Mgmt phone [phone] 

## 2014-07-12 NOTE — Progress Notes (Signed)
Pt continues to pull at feeding tube after being educated. He pulled tube out twice. The second time he pulled it all the way out. MD paged and made aware.

## 2014-07-12 NOTE — Progress Notes (Signed)
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 [name].   Presented to [facility] hospital on 06/11/2014 complaining of SOB. 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 Also noted to be in a-fib. Evaluated by CT surgery for consideration of high risk AVR. Cath showed mild CAD however cannot visually distal RCA system. Surgery delayed due to recurrent GI bleed treated with EGDx2 and clipping. I discharged the patient to CIR on 6/3 to max out physical therapy before high risk AVR. Underwent dental extraction on 6/10, post op, had VT arrest, s/p CPR and intubation. ID consulted after blood cx came back with enteroccocal infection (likely contamination from central line)  HPI/Subjective: 6/16 A/O x 4 States negative DOE, negative sore mouth.   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 Review of findings from 5/24 cardiac catheterization does not mention thrombosed aortic valve. -AVR dysfunction secondary to one of the leaflets being immobile see TEE results 5/20 below -CT surgery states patient is not currently a candidate for placement until  completed 2 weeks antibiotics and a course of physical therapy at CIR -Not accepted to CIR -SNF will need to be located which will accept this complex patient  Noncompliant with Coumadin therapy for 3+ months -See thrombosed aortic valve  LUE DVT -Heparin per  pharmacy  NSVT/HTN -start metoprolol IV 5 mg q 4hr -Hydralazine IV PRN SBP> 160 or DBP> 100  Chronic atrial fibrillation:  - CHA2DS2-Vasc score of 3, 2dary to uncontrolled Bleeding from teeth extraction D/C  Heparin gtt. Spoke with Dr. [name] (Cardiology) & recommended we restart per Cardiology Pharmacist  -SCD  -Rate controlled with IV metoprolol   Acute on chronic renal failure -Cr improving, continue monitor closely  Severe pharyngeal phase dysphagia -due to weakness, secretions and decreased awareness and initiation patient did not pass swallow study on 6/15 - nutrition is key in preparing patient for eventual life-saving valve surgery, continue NG tube feeds  -Speech to reevaluate patient on 6/18  Poor dentation  -unsure if DDS [name] (dental) plans on removing remaining teeth as they are also in bad shape  Enterococcal bacteremia -Possible contaminant since it was isolated from picc line.  -CT surgery requests patient complete 2 weeks of antibiotics  -Continue on amp plus ceftriaxone.  -Recheck blood cultures, if negative will DC ceftriaxone and complete 2 weeks of antibiotics with ampicillin  Recent GI bleed.  H/o Aortic dissection  Hematuria: hgb stable   Code Status: FULL Family Communication: no family present at time of exam Disposition Plan: Resolution of bacteremia: CIR    Consultants: Dr.[name] (PCCM) Dr.[name] (cardiology) Dr.[name] (infectious disease) DDS [name] (dental) Dr.[name] (cardiothoracic surgery)   Procedure/Significant Events: 5/20 TEE'; - LVEF=55% to 60%.- Aortic valve: St. Jude Mechanical AVR is present. Only one of the leaflets visualized as moving; significantly reduced; other leaflet is immobile.  -severe aortic insufficiency  - Aorta: aortic dissection flap in  descending thoracic aorta that extends up to the arch. - Mitral valve: moderate regurgitation. - Left  atrium: severely dilated. No thrombus in the atrial cavity or appendage. - Right atrium: moderately dilated. Nothrombus in the atrial cavity or appendage. -Tricuspid valve: moderate regurgitation.  5/24 upper extremity Doppler;acute deep vein thrombosis involving axillary and brachial veins LUE -Superficial thrombosis in Basilic vein.    Culture 6/9 MRSA by PCR negative 6/10 blood central line 2 positive enterococcus 6/13 blood left/right foot NGTD   Antibiotics: Ampicillin 6/12 (6/13 is day 1/14)>> Ceftriaxone 6/12>> stopped 6/15  DVT prophylaxis: Heparin gtt   Devices    LINES / TUBES:      Continuous Infusions: . sodium chloride 10 mL/hr at 07/11/14 1900  . feeding supplement (OSMOLITE 1.2 CAL) 1,000 mL (07/12/14 1500)  . heparin 1,350 Units/hr (07/12/14 1942)    Objective: VITAL SIGNS: Temp: 97.6 F (36.4 C) (06/16 1926) Temp Source: Oral (06/16 1926) BP: 168/103 mmHg (06/16 1926) Pulse Rate: 82 (06/16 1926) SPO2; FIO2:   Intake/Output Summary (Last 24 hours) at 07/12/14 1950 Last data filed at 07/12/14 1800  Gross per 24 hour  Intake  936.4 ml  Output    435 ml  Net  501.4 ml     Exam: General: A/O x 4, NAD  No acute respiratory distress Eyes: Negative headache, eye pain, double vision, scotomas, floaters, negative retinal hemorrhage ENT: Negative Runny nose, mild positive gingival bleeding/dried caked on blood,Positive odynophagia Neck:  Negative scars, masses, torticollis, lymphadenopathy, JVD Lungs: Clear to auscultation bilaterally without wheezes or crackles Cardiovascular:  IRIR rate and rhythm with grade 4/6 systolic murmur (click), normal S1 and S2 Abdomen: negative abdominal pain, negative dysphagia, Nontender, nondistended, soft, bowel sounds positive, no rebound, no ascites, no appreciable mass Extremities: No significant cyanosis, clubbing, or edema bilateral lower extremities Psychiatric:  Negative depression, negative anxiety,  negative fatigue, negative mania  Neurologic:  Cranial nerves II through XII intact, tongue/uvula midline, all extremities muscle strength 5/5, sensation intact throughout, positive dysarthria, negative expressive aphasia, negative receptive aphasia.  Endocrine;     Data Reviewed: Basic Metabolic Panel:  Recent Labs Lab 07/06/14 1440 07/07/14 0400 07/08/14 0400 07/10/14 0420 07/11/14 0310 07/12/14 0400  NA 133* 136 134* 141 143 145  K 4.7 4.7 4.1 4.0 3.7 3.6  CL 103 106 104 109 108 108  CO2 21* 22 23 24 23 22   GLUCOSE 128* 90 113* 100* 101* 122*  BUN 12 18 22* 18 18 22*  CREATININE 1.46* 2.22* 2.18* 1.52* 1.58* 1.59*  CALCIUM 8.8* 8.9 8.6* 9.4 9.2 9.2  MG 1.8 2.0 2.0  --  1.7 1.8  PHOS  --   --  4.2  --   --   --    Liver Function Tests:  Recent Labs Lab 07/06/14 0519 07/06/14 1440 07/07/14 0400 07/11/14 0310 07/12/14 0400  AST 21 27 24 21 21   ALT 12* 14* 14* 10* 13*  ALKPHOS 47 47 44 46 44  BILITOT 0.7 1.0 0.7 1.0 0.8  PROT 6.4* 6.7 6.1* 6.9 7.0  ALBUMIN 2.6* 2.7* 2.6* 2.6* 2.7*   No results for input(s): LIPASE, AMYLASE in the last 168 hours. No results for input(s): AMMONIA in the last 168 hours. CBC:  Recent Labs Lab 07/08/14 0400 07/09/14 0500 07/10/14 0420 07/11/14 0310 07/12/14 0400  WBC 8.6 7.7 6.9 6.6 7.4  NEUTROABS  --   --   --  4.2 4.8  HGB 8.4* 8.6* 8.3* 8.6* 8.9*  HCT 26.3* 27.1*  26.4* 28.1* 28.5*  MCV 87.4 87.4 88.3 87.5 86.6  PLT 262 242 313 316 310   Cardiac Enzymes:  Recent Labs Lab 07/06/14 1440 07/06/14 2003 07/07/14 0202  TROPONINI 0.06* 0.07* 0.08*   BNP (last 3 results)  Recent Labs  06/14/14 2105  BNP 1438.6*    ProBNP (last 3 results) No results for input(s): PROBNP in the last 8760 hours.  CBG:  Recent Labs Lab 07/09/14 0346 07/09/14 0840 07/09/14 0847 07/09/14 1145 07/10/14 1620  GLUCAP 83 68 81 83 102*    Recent Results (from the past 240 hour(s))  MRSA PCR Screening     Status: None    Collection Time: 07/01/2014  5:00 PM  Result Value Ref Range Status   MRSA by PCR NEGATIVE NEGATIVE Final    Comment:        The GeneXpert MRSA Assay (FDA approved for NASAL specimens only), is one component of a comprehensive MRSA colonization surveillance program. It is not intended to diagnose MRSA infection nor to guide or monitor treatment for MRSA infections.   Culture, blood (routine x 2)     Status: None   Collection Time: 07/06/14  4:20 PM  Result Value Ref Range Status   Specimen Description BLOOD CENTRAL LINE  Final   Special Requests BOTTLES DRAWN AEROBIC ONLY 10CC  Final   Culture   Final    ENTEROCOCCUS SPECIES Note: SUSCEPTIBILITIES PERFORMED ON PREVIOUS CULTURE WITHIN THE LAST 5 DAYS. Note: Gram Stain Report Called to,Read Back By and Verified With: A AFATSAWO 07/07/14 1055 BY [email or web address] Performed at [facility]    Report Status 07/09/2014 FINAL  Final  Culture, blood (routine x 2)     Status: None   Collection Time: 07/06/14  4:25 PM  Result Value Ref Range Status   Specimen Description BLOOD CENTRAL LINE  Final   Special Requests BOTTLES DRAWN AEROBIC ONLY 10CC  Final   Culture   Final    ENTEROCOCCUS SPECIES Note: Gram Stain Report Called to,Read Back By and Verified With: A AFATSAWO 07/07/14 1055 BY [email or web address] Performed at [facility]    Report Status 07/09/2014 FINAL  Final   Organism ID, Bacteria ENTEROCOCCUS SPECIES  Final      Susceptibility   Enterococcus species - MIC*    AMPICILLIN <=2 SENSITIVE Sensitive     VANCOMYCIN 2 SENSITIVE Sensitive     * ENTEROCOCCUS SPECIES  Culture, blood (routine x 2)     Status: None (Preliminary result)   Collection Time: 07/09/14  4:20 PM  Result Value Ref Range Status   Specimen Description BLOOD LEFT FOOT  Final   Special Requests BOTTLES DRAWN AEROBIC ONLY 10CC  Final   Culture   Final           BLOOD CULTURE RECEIVED NO GROWTH TO DATE CULTURE WILL BE HELD FOR 5 DAYS BEFORE ISSUING A FINAL  NEGATIVE REPORT Performed at [facility]    Report Status PENDING  Incomplete  Culture, blood (routine x 2)     Status: None (Preliminary result)   Collection Time: 07/09/14  4:30 PM  Result Value Ref Range Status   Specimen Description BLOOD RIGHT FOOT  Final   Special Requests BOTTLES DRAWN AEROBIC ONLY 3CC  Final   Culture   Final           BLOOD CULTURE RECEIVED NO GROWTH TO DATE CULTURE WILL BE HELD FOR 5 DAYS BEFORE ISSUING A FINAL NEGATIVE REPORT Performed at [facility]  Report Status PENDING  Incomplete     Studies:  Recent x-ray studies have been reviewed in detail by the Attending Physician  Scheduled Meds:  Scheduled Meds: . ampicillin (OMNIPEN) IV  2 g Intravenous 6 times per day  . chlorhexidine  15 mL Mouth/Throat BID  . metoprolol  10 mg Intravenous Q4H  . pantoprazole (PROTONIX) IV  40 mg Intravenous Q12H    Time spent on care of this patient: 40 mins   Zharia Conrow, Geraldo Docker , MD  [facility] Office  [phone] Pager - [phone]  On-Call/Text Page:      [email or web address]      password [email or web address]  If 7PM-7AM, please contact night-coverage [email or web address] Password [email or web address] 07/12/2014, 7:50 PM   LOS: 7 days   Care during the described time interval was provided by me .  I have reviewed this patient's available data, including medical history, events of note, physical examination, and all test results as part of my evaluation. I have personally reviewed and interpreted all radiology studies.   Dia Crawford, MD [phone] Pager

## 2014-07-13 ENCOUNTER — Inpatient Hospital Stay (HOSPITAL_COMMUNITY): Payer: Commercial Managed Care - HMO

## 2014-07-13 ENCOUNTER — Other Ambulatory Visit: Payer: Self-pay

## 2014-07-13 ENCOUNTER — Inpatient Hospital Stay (HOSPITAL_COMMUNITY)
Admission: RE | Admit: 2014-07-13 | Payer: Commercial Managed Care - HMO | Source: Ambulatory Visit | Admitting: Cardiothoracic Surgery

## 2014-07-13 DIAGNOSIS — I350 Nonrheumatic aortic (valve) stenosis: Secondary | ICD-10-CM

## 2014-07-13 DIAGNOSIS — Z01818 Encounter for other preprocedural examination: Secondary | ICD-10-CM

## 2014-07-13 DIAGNOSIS — K259 Gastric ulcer, unspecified as acute or chronic, without hemorrhage or perforation: Secondary | ICD-10-CM

## 2014-07-13 DIAGNOSIS — Z7901 Long term (current) use of anticoagulants: Secondary | ICD-10-CM

## 2014-07-13 LAB — CBC WITH DIFFERENTIAL/PLATELET
BASOS PCT: 0 % (ref 0–1)
Basophils Absolute: 0 10*3/uL (ref 0.0–0.1)
EOS ABS: 0 10*3/uL (ref 0.0–0.7)
Eosinophils Relative: 0 % (ref 0–5)
HEMATOCRIT: 29.8 % — AB (ref 39.0–52.0)
HEMOGLOBIN: 9.3 g/dL — AB (ref 13.0–17.0)
LYMPHS ABS: 0.7 10*3/uL (ref 0.7–4.0)
Lymphocytes Relative: 9 % — ABNORMAL LOW (ref 12–46)
MCH: 27.8 pg (ref 26.0–34.0)
MCHC: 31.2 g/dL (ref 30.0–36.0)
MCV: 89 fL (ref 78.0–100.0)
MONOS PCT: 16 % — AB (ref 3–12)
Monocytes Absolute: 1.3 10*3/uL — ABNORMAL HIGH (ref 0.1–1.0)
NEUTROS PCT: 75 % (ref 43–77)
Neutro Abs: 5.9 10*3/uL (ref 1.7–7.7)
Platelets: 345 10*3/uL (ref 150–400)
RBC: 3.35 MIL/uL — AB (ref 4.22–5.81)
RDW: 18.4 % — ABNORMAL HIGH (ref 11.5–15.5)
WBC: 7.9 10*3/uL (ref 4.0–10.5)

## 2014-07-13 LAB — COMPREHENSIVE METABOLIC PANEL
ALBUMIN: 2.9 g/dL — AB (ref 3.5–5.0)
ALK PHOS: 45 U/L (ref 38–126)
ALT: 16 U/L — ABNORMAL LOW (ref 17–63)
ANION GAP: 9 (ref 5–15)
AST: 25 U/L (ref 15–41)
BILIRUBIN TOTAL: 0.8 mg/dL (ref 0.3–1.2)
BUN: 29 mg/dL — ABNORMAL HIGH (ref 6–20)
CALCIUM: 9.3 mg/dL (ref 8.9–10.3)
CO2: 24 mmol/L (ref 22–32)
Chloride: 112 mmol/L — ABNORMAL HIGH (ref 101–111)
Creatinine, Ser: 1.87 mg/dL — ABNORMAL HIGH (ref 0.61–1.24)
GFR calc Af Amer: 41 mL/min — ABNORMAL LOW (ref >60)
GFR calc non Af Amer: 36 mL/min — ABNORMAL LOW (ref >60)
Glucose, Bld: 171 mg/dL — ABNORMAL HIGH (ref 65–99)
Potassium: 3.5 mmol/L (ref 3.5–5.1)
Sodium: 145 mmol/L (ref 135–145)
TOTAL PROTEIN: 7.4 g/dL (ref 6.5–8.1)

## 2014-07-13 LAB — MAGNESIUM: Magnesium: 1.9 mg/dL (ref 1.7–2.4)

## 2014-07-13 LAB — CARBOXYHEMOGLOBIN
Carboxyhemoglobin: 1 % (ref 0.5–1.5)
Methemoglobin: 1 % (ref 0.0–1.5)
O2 Saturation: 54.5 %
Total hemoglobin: 8.9 g/dL — ABNORMAL LOW (ref 13.5–18.0)

## 2014-07-13 LAB — HEPARIN LEVEL (UNFRACTIONATED): HEPARIN UNFRACTIONATED: 0.6 [IU]/mL (ref 0.30–0.70)

## 2014-07-13 MED ORDER — CHLORHEXIDINE GLUCONATE 0.12 % MT SOLN: 15.0000 mL | Freq: Two times a day (BID) | OROMUCOSAL | Status: DC

## 2014-07-13 MED ORDER — HYDRALAZINE HCL 20 MG/ML IJ SOLN
7.0000 mg | Freq: Four times a day (QID) | INTRAMUSCULAR | Status: DC
  Administered 2014-07-13 – 2014-07-17 (×16): 7 mg via INTRAVENOUS
  Filled 2014-07-13: qty 1
  Filled 2014-07-13 (×7): qty 0.35
  Filled 2014-07-13 (×2): qty 1
  Filled 2014-07-13 (×5): qty 0.35
  Filled 2014-07-13: qty 1
  Filled 2014-07-13 (×2): qty 0.35
  Filled 2014-07-13: qty 1
  Filled 2014-07-13 (×2): qty 0.35

## 2014-07-13 MED ORDER — CETYLPYRIDINIUM CHLORIDE 0.05 % MT LIQD
7.0000 mL | Freq: Two times a day (BID) | OROMUCOSAL | Status: DC
  Administered 2014-07-14 – 2014-07-18 (×10): 7 mL via OROMUCOSAL

## 2014-07-13 MED ORDER — ATROPINE SULFATE 0.1 MG/ML IJ SOLN
INTRAMUSCULAR | Status: AC
  Filled 2014-07-13: qty 10

## 2014-07-13 NOTE — Progress Notes (Signed)
Physical Therapy Treatment Patient Details Name: [name] MRN: [id number] DOB: 1946/01/25 Today's Date: 07/13/2014    History of Present Illness 69 y.o. 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 Plan to have AVR redo by Dr Nils Pyle at the end of June. He underwent Multiple tooth extractions on 07/26/2014.  Post op complicated by VT arrest requiring ~ 2 min of CPR. Intubated 07/06/14.  Extubated 07/08/14.    PT Comments    Pt with increased gait distance today with max encouragement. Pt continues to perseverate on lack of food and using it as a reason to limit mobility with cues for continued mobility to increase strength and function. Encouraged OOB and will continue to follow.   Follow Up Recommendations  SNF     Equipment Recommendations       Recommendations for Other Services       Precautions / Restrictions Precautions Precautions: Fall    Mobility  Bed Mobility Overal bed mobility: Modified Independent Bed Mobility: Sit to Supine       Sit to supine: Modified independent (Device/Increase time)   General bed mobility comments: in chair on arrival, returned to bed without assist  Transfers Overall transfer level: Needs assistance   Transfers: Sit to/from Stand Sit to Stand: Min assist;+2 physical assistance         General transfer comment: min assist with cues, hand placement and foot position from chair x 2.   Ambulation/Gait Ambulation/Gait assistance: Min assist;+2 safety/equipment Ambulation Distance (Feet): 43 Feet Assistive device: Rolling walker (2 wheeled) Gait Pattern/deviations: Step-through pattern;Decreased stride length;Trunk flexed   Gait velocity interpretation: Below normal speed for  age/gender General Gait Details: cues for posture, position in RW  with chair pulled behind pt and encouragement to increase distance. Pt walked 84' then 28' with max encouragement   Stairs            Wheelchair Mobility    Modified Rankin (Stroke Patients Only)       Balance Overall balance assessment: Needs assistance   Sitting balance-Leahy Scale: Good       Standing balance-Leahy Scale: Poor                      Cognition Arousal/Alertness: Awake/alert Behavior During Therapy: Flat affect Overall Cognitive Status: Within Functional Limits for tasks assessed                 General Comments: slow to answer questions and slow to move,  self limiting    Exercises      General Comments        Pertinent Vitals/Pain Pain Assessment: No/denies pain  HR 82-86    Home Living                      Prior Function            PT Goals (current goals can now be found in the care plan section) Progress towards PT goals: Progressing toward goals    Frequency       PT Plan Current plan remains appropriate    Co-evaluation             End of Session Equipment Utilized During Treatment: Gait belt Activity Tolerance: Patient tolerated treatment  well Patient left: in bed;with call bell/phone within reach;with bed alarm set     Time: 1410-3013 PT Time Calculation (min) (ACUTE ONLY): 23 min  Charges:  $Gait Training: 8-22 mins $Therapeutic Activity: 8-22 mins                    G Codes:      [name] August 08, 2014, 10:32 AM [name], [place]

## 2014-07-13 NOTE — Progress Notes (Signed)
Medicare Important Message given? YES ank)  Date Medicare IM given:  07/13/2014 Medicare IM given by: Jonnie Finner

## 2014-07-13 NOTE — Progress Notes (Signed)
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 [name].   Presented to [facility] on 06/11/2014 complaining of SOB. 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 Also noted to be in a-fib. Evaluated by CT surgery for consideration of high risk AVR. Cath showed mild CAD however cannot visually distal RCA system. Surgery delayed due to recurrent GI bleed treated with EGDx2 and clipping. I discharged the patient to CIR on 6/3 to max out physical therapy before high risk AVR. Underwent dental extraction on 6/10, post op, had VT arrest, s/p CPR and intubation. ID consulted after blood cx came back with enteroccocal infection (likely contamination from central line)  HPI/Subjective: 6/17 A/O x 4 States negative DOE, negative sore mouth. 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 Review of findings from 5/24 cardiac catheterization does not mention thrombosed aortic valve. -AVR dysfunction secondary to one of the leaflets being immobile see TEE results 5/20 below -CT surgery states patient is not currently a candidate for placement until  completed 2 weeks antibiotics and a course of physical therapy at CIR -Not accepted to CIR -Bid to have gone out to SNF  Noncompliant with Coumadin therapy for 3+ months -See thrombosed aortic valve  LUE  DVT -Heparin per pharmacy  NSVT/HTN -start metoprolol IV 10 mg q 4hr -Hydralazine IV 7 mg QID -Hydralazine IV PRN SBP> 160 or DBP> 100  Chronic atrial fibrillation:  - CHA2DS2-Vasc score of 3, 2dary to uncontrolled Bleeding from teeth extraction D/C  Heparin gtt. Spoke with Dr. [name] (Cardiology) & recommended we restart per Cardiology Pharmacist  -Rate controlled with IV metoprolol   Acute on chronic renal failure -Cr bumped up slightly today -, continue monitor closely  Severe pharyngeal phase dysphagia -due to weakness, secretions and decreased awareness and initiation patient did not pass swallow study on 6/15 - nutrition is key in preparing patient for eventual life-saving valve surgery, continue NG tube feeds  -Speech to reevaluate patient on 6/18; SNF will not except patient with NG tube, therefore if fails evaluation in a.m. will require PEG.  Poor dentation  - DDS [name] (dental) does not believe patient would tolerate removal of remaining teeth.  -Counseled patient and daughter that this may need to be addressed at later date after valve replaced  Enterococcal bacteremia -Possible contaminant since it was isolated from picc line.  -CT surgery requests patient complete 2 weeks of antibiotics  -Continue on amp plus ceftriaxone.  -Recheck blood cultures NGTD, therefore  DC ceftriaxone and complete 2 weeks of antibiotics with ampicillin  Recent GI bleed. -Hemoglobin stable  H/o Aortic dissection -BP rising have restarted some of patient's BP medication -Initially Maintain SBP<145, will slowly titrate medication and obtain lowest SBP patient will tolerate as his physical condition improves  Hematuria: hgb stable   Code Status: FULL Family Communication: Daughter present at time of exam  Disposition Plan: Resolution of bacteremia: SNF    Consultants: Dr.[name] (PCCM) Dr.[name] (cardiology) Dr.[name] (infectious  disease) DDS [name] (dental) Dr.[name] (cardiothoracic surgery)   Procedure/Significant Events: 5/20 TEE'; - LVEF=55% to 60%.- Aortic valve: St. Jude Mechanical AVR is present. Only one of the leaflets visualized as moving; significantly reduced; other leaflet is immobile.  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 - Mitral valve: moderate regurgitation. - Left atrium: severely dilated. No thrombus in the atrial cavity or appendage. - Right atrium: moderately dilated. Nothrombus in the atrial cavity or appendage. -Tricuspid valve: moderate regurgitation.  5/24 upper extremity Doppler;acute deep vein thrombosis involving axillary and brachial veins LUE -Superficial thrombosis in Basilic vein.    Culture 6/9 MRSA by PCR negative 6/10 blood central line 2 positive enterococcus 6/13 blood left/right foot NGTD   Antibiotics: Ampicillin 6/12 (6/13 is day 1/14)>> Ceftriaxone 6/12>> stopped 6/15  DVT prophylaxis: Heparin gtt   Devices    LINES / TUBES:      Continuous Infusions: . sodium chloride 10 mL/hr at 07/13/14 0914  . feeding supplement (OSMOLITE 1.2 CAL) 1,000 mL (07/13/14 0700)  . heparin 1,300 Units/hr (07/13/14 1524)    Objective: VITAL SIGNS: Temp: 98.4 F (36.9 C) (06/17 1600) Temp Source: Oral (06/17 1600) BP: 136/103 mmHg (06/17 1600) Pulse Rate: 81 (06/17 1600) SPO2; FIO2:   Intake/Output Summary (Last 24 hours) at 07/13/14 2010 Last data filed at 07/13/14 1800  Gross per 24 hour  Intake 1477.46 ml  Output    255 ml  Net 1222.46 ml     Exam: General: A/O x 4, NAD  No acute respiratory distress Eyes: Negative headache, eye pain, double vision, scotomas, floaters, negative retinal hemorrhage ENT: Negative Runny nose, mild positive gingival bleeding/dried caked on blood,Positive odynophagia Neck:  Negative scars, masses, torticollis,  lymphadenopathy, JVD Lungs: Clear to auscultation bilaterally without wheezes or crackles; patient with phlegm and main bronchus which cannot be cleared Cardiovascular:  IRIR rate and rhythm with grade 4/6 systolic murmur (click), normal S1 and S2 Abdomen: negative abdominal pain, negative dysphagia, Nontender, nondistended, soft, bowel sounds positive, no rebound, no ascites, no appreciable mass Extremities: No significant cyanosis, clubbing, or edema bilateral lower extremities Psychiatric:  Negative depression, negative anxiety, negative fatigue, negative mania  Neurologic:  Cranial nerves II through XII intact, tongue/uvula midline, all extremities muscle strength 5/5, sensation intact throughout, positive dysarthria, negative expressive aphasia, negative receptive aphasia.      Data Reviewed: Basic Metabolic Panel:  Recent Labs Lab 07/07/14 0400 07/08/14 0400 07/10/14 0420 07/11/14 0310 07/12/14 0400 07/13/14 0340  NA 136 134* 141 143 145 145  K 4.7 4.1 4.0 3.7 3.6 3.5  CL 106 104 109 108 108 112*  CO2 22 23 24 23 22 24   GLUCOSE 90 113* 100* 101* 122* 171*  BUN 18 22* 18 18 22* 29*  CREATININE 2.22* 2.18* 1.52* 1.58* 1.59* 1.87*  CALCIUM 8.9 8.6* 9.4 9.2 9.2 9.3  MG 2.0 2.0  --  1.7 1.8 1.9  PHOS  --  4.2  --   --   --   --    Liver Function Tests:  Recent Labs Lab 07/07/14 0400 07/11/14 0310 07/12/14 0400 07/13/14 0340  AST 24 21 21 25   ALT 14* 10* 13* 16*  ALKPHOS 44 46 44 45  BILITOT 0.7 1.0 0.8 0.8  PROT 6.1* 6.9 7.0 7.4  ALBUMIN 2.6* 2.6* 2.7* 2.9*   No results for input(s): LIPASE, AMYLASE in the last 168 hours. No results for input(s): AMMONIA in the last 168 hours. CBC:  Recent Labs Lab 07/09/14 0500 07/10/14 0420 07/11/14 0310 07/12/14 0400 07/13/14 0340  WBC 7.7 6.9 6.6 7.4 7.9  NEUTROABS  --   --  4.2 4.8 5.9  HGB 8.6* 8.3* 8.6* 8.9* 9.3*  HCT 27.1* 26.4* 28.1* 28.5* 29.8*  MCV 87.4 88.3 87.5 86.6 89.0  PLT 242 313 316 310 345    Cardiac Enzymes:  Recent Labs Lab 07/07/14 0202  TROPONINI 0.08*   BNP (last 3 results)  Recent Labs  06/14/14 2105  BNP 1438.6*    ProBNP (last 3 results) No results for input(s): PROBNP in the last 8760 hours.  CBG:  Recent Labs Lab 07/09/14 0346 07/09/14 0840 07/09/14 0847 07/09/14 1145 07/10/14 1620  GLUCAP 83 68 81 83 102*    Recent Results (from the past 240 hour(s))  MRSA PCR Screening     Status: None   Collection Time: 07/13/2014  5:00 PM  Result Value Ref Range Status   MRSA by PCR NEGATIVE NEGATIVE Final    Comment:        The GeneXpert MRSA Assay (FDA approved for NASAL specimens only), is one component of a comprehensive MRSA colonization surveillance program. It is not intended to diagnose MRSA infection nor to guide or monitor treatment for MRSA infections.   Culture, blood (routine x 2)     Status: None   Collection Time: 07/06/14  4:20 PM  Result Value Ref Range Status   Specimen Description BLOOD CENTRAL LINE  Final   Special Requests BOTTLES DRAWN AEROBIC ONLY 10CC  Final   Culture   Final    ENTEROCOCCUS SPECIES Note: SUSCEPTIBILITIES PERFORMED ON PREVIOUS CULTURE WITHIN THE LAST 5 DAYS. Note: Gram Stain Report Called to,Read Back By and Verified With: A AFATSAWO 07/07/14 1055 BY SMITHERSJ Performed at [facility]    Report Status 07/09/2014 FINAL  Final  Culture, blood (routine x 2)     Status: None   Collection Time: 07/06/14  4:25 PM  Result Value Ref Range Status   Specimen Description BLOOD CENTRAL LINE  Final   Special Requests BOTTLES DRAWN AEROBIC ONLY 10CC  Final   Culture   Final    ENTEROCOCCUS SPECIES Note: Gram Stain Report Called to,Read Back By and Verified With: A AFATSAWO 07/07/14 1055 BY SMITHERSJ Performed at [facility]    Report Status 07/09/2014 FINAL  Final   Organism ID, Bacteria ENTEROCOCCUS SPECIES  Final      Susceptibility   Enterococcus species - MIC*    AMPICILLIN <=2 SENSITIVE  Sensitive     VANCOMYCIN 2 SENSITIVE Sensitive     * ENTEROCOCCUS SPECIES  Culture, blood (routine x 2)     Status: None (Preliminary result)   Collection Time: 07/09/14  4:20 PM  Result Value Ref Range Status   Specimen Description BLOOD LEFT FOOT  Final   Special Requests BOTTLES DRAWN AEROBIC ONLY 10CC  Final   Culture   Final           BLOOD CULTURE RECEIVED NO GROWTH TO DATE CULTURE WILL BE HELD FOR 5 DAYS BEFORE ISSUING A FINAL NEGATIVE REPORT Performed at [facility]    Report Status PENDING  Incomplete  Culture, blood (routine x 2)     Status: None (Preliminary result)   Collection Time: 07/09/14  4:30 PM  Result Value Ref Range Status   Specimen Description BLOOD RIGHT FOOT  Final   Special Requests BOTTLES DRAWN AEROBIC ONLY 3CC  Final   Culture   Final           BLOOD CULTURE RECEIVED NO GROWTH TO DATE CULTURE WILL BE HELD FOR 5 DAYS BEFORE ISSUING A FINAL NEGATIVE REPORT Performed at [facility]    Report Status PENDING  Incomplete     Studies:  Recent x-ray studies have been reviewed in detail by the Attending Physician  Scheduled Meds:  Scheduled Meds: . ampicillin (OMNIPEN) IV  2 g Intravenous 6 times per day  . antiseptic oral rinse  7 mL Mouth Rinse q12n4p  . chlorhexidine  15 mL Mouth/Throat BID  . metoprolol  10 mg Intravenous Q4H  . pantoprazole (PROTONIX) IV  40 mg Intravenous Q12H    Time spent on care of this patient: 40 mins   WOODS, Geraldo Docker , MD  [facility] Office  [phone] Pager - [phone]  On-Call/Text Page:      [email or web address]      password [email or web address]  If 7PM-7AM, please contact night-coverage [email or web address] Password [email or web address] 07/13/2014, 8:10 PM   LOS: 8 days   Care during the described time interval was provided by me .  I have reviewed this patient's available data, including medical history, events of note, physical examination, and all test results as part of my evaluation. I have personally reviewed and  interpreted all radiology studies.   Dia Crawford, MD [phone] Pager

## 2014-07-13 NOTE — Progress Notes (Signed)
[place] for Heparin  Indication: DVT (also mechanical AV thrombosis & Afib)  No Known Allergies  Patient Measurements: Height: 5\' 8"  (172.7 cm) Weight: 231 lb 7.7 oz (105 kg) IBW/kg (Calculated) : 68.4 Heparin dosing weight = 91.6 kg Vital Signs: Temp: 97.4 F (36.3 C) (06/17 0800) Temp Source: Oral (06/17 0800) BP: 176/138 mmHg (06/17 0800) Pulse Rate: 90 (06/17 0400)  Labs:  Recent Labs  07/11/14 0310  07/12/14 0400 07/12/14 0500 07/12/14 1025 07/13/14 0340  HGB 8.6*  --  8.9*  --   --  9.3*  HCT 28.1*  --  28.5*  --   --  29.8*  PLT 316  --  310  --   --  345  HEPARINUNFRC  --   < >  --  1.03* 0.50 0.60  CREATININE 1.58*  --  1.59*  --   --  1.87*  < > = values in this interval not displayed.  Estimated Creatinine Clearance: 45 mL/min (by C-G formula based on Cr of 1.87).   Assessment: 69 yo male with Afib/AVR, s/p tooth extraction last week.   Heparin held for most of 6/14 d/t ongoing oral bleeding this has resolved.  Hgb stable at 9.3, no bleeding issues noted. HL above desired goal at 0.6 will adjust rate. Unsure if additional dental surgery is needed will continue heparin for now.  Goal of Therapy:  Heparin level 0.3-0.5 Monitor platelets by anticoagulation protocol: Yes   Plan:  Reduce heparin to 1300 units/hr Daily HL, CBC  Erin Hearing PharmD., BCPS Clinical Pharmacist Pager [phone] 07/13/2014 8:52 AM

## 2014-07-13 NOTE — Progress Notes (Signed)
POST OPERATIVE NOTE: POD #8  07/13/2014 [place] Found [id number]  VITALS: BP 142/50 mmHg  Pulse 90  Temp(Src) 97.4 F (36.3 C) (Oral)  Resp 15  Ht 5\' 8"  (1.727 m)  Wt 230 lb 13.2 oz (104.7 kg)  BMI 35.10 kg/m2  SpO2 99%  LABS:  Lab Results  Component Value Date   WBC 7.9 07/13/2014   HGB 9.3* 07/13/2014   HCT 29.8* 07/13/2014   MCV 89.0 07/13/2014   PLT 345 07/13/2014   BMET    Component Value Date/Time   NA 145 07/13/2014 0340   K 3.5 07/13/2014 0340   CL 112* 07/13/2014 0340   CO2 24 07/13/2014 0340   GLUCOSE 171* 07/13/2014 0340   BUN 29* 07/13/2014 0340   CREATININE 1.87* 07/13/2014 0340   CALCIUM 9.3 07/13/2014 0340   GFRNONAA 36* 07/13/2014 0340   GFRAA 41* 07/13/2014 0340    Lab Results  Component Value Date   INR 1.37 06/21/2014   INR 1.67* 06/15/2014   No results found for: PTT   [place] Found is status post multiple dental extractions with alveoloplasty and gross debridement of remaining dentition in the OR as part of a pre-heart valve surgery dental protocol. Patient subsequently had a V. tach cardiac arrest the day after surgery. Patient with history of  postoperative bleeding secondary to heparin therapy. Dr. Tharon Aquas Trigt recently evaluated the patient and determined that further inpatient rehabilitation was required for his deconditioning prior to anticipated heart valve surgery in 2-4 weeks. Patient now seen to evaluate postop condition.  SUBJECTIVE: Patient continues to feel "fair".  EXAM: There is no sign of active bleeding, heme, or infection. Sutures are intact. Clots are present. Copious amounts of thick saliva are noted.   ASSESSMENT: Post operative course is consistent with dental procedures performed in the operating room with postoperative bleeding secondary to heparin therapy.   PLAN: 1. Continue Chlorhexidine gluconate rinses twice daily after breakfast and at bedtime. 2. 4 x 4 gauze to bleeding sites as needed. 3.  Assist patient in brushing teeth at least twice daily. 4. Sutures to dissolve on their own. 5. No further dental extractions anticipated. Patient initially chose limited extractions and I do NOT feel patient could tolerate additional extractions at this time.   [name], DDS         [name], DDS

## 2014-07-13 NOTE — Progress Notes (Signed)
Pt started complaining of new abdominal pain 10/10. Shortly after complaint pt started intermittently decreasing HR to low 30s. Pt maintained BP, stable VS and consciousness during brady periods and the HR would come back up on its own. NP Rogue Bussing came to bedside, orders received. Pt was placed on Zoll, atropine is at bedside. Will continue to monitor.

## 2014-07-14 ENCOUNTER — Inpatient Hospital Stay (HOSPITAL_COMMUNITY): Payer: Commercial Managed Care - HMO

## 2014-07-14 DIAGNOSIS — R1310 Dysphagia, unspecified: Secondary | ICD-10-CM | POA: Diagnosis present

## 2014-07-14 DIAGNOSIS — K257 Chronic gastric ulcer without hemorrhage or perforation: Secondary | ICD-10-CM

## 2014-07-14 LAB — CBC WITH DIFFERENTIAL/PLATELET
Basophils Absolute: 0 10*3/uL (ref 0.0–0.1)
Basophils Relative: 0 % (ref 0–1)
EOS ABS: 0.1 10*3/uL (ref 0.0–0.7)
Eosinophils Relative: 1 % (ref 0–5)
HCT: 29.4 % — ABNORMAL LOW (ref 39.0–52.0)
Hemoglobin: 8.9 g/dL — ABNORMAL LOW (ref 13.0–17.0)
Lymphocytes Relative: 11 % — ABNORMAL LOW (ref 12–46)
Lymphs Abs: 1.2 10*3/uL (ref 0.7–4.0)
MCH: 26.7 pg (ref 26.0–34.0)
MCHC: 30.3 g/dL (ref 30.0–36.0)
MCV: 88.3 fL (ref 78.0–100.0)
Monocytes Absolute: 1.6 10*3/uL — ABNORMAL HIGH (ref 0.1–1.0)
Monocytes Relative: 15 % — ABNORMAL HIGH (ref 3–12)
NEUTROS ABS: 7.4 10*3/uL (ref 1.7–7.7)
Neutrophils Relative %: 73 % (ref 43–77)
PLATELETS: 348 10*3/uL (ref 150–400)
RBC: 3.33 MIL/uL — ABNORMAL LOW (ref 4.22–5.81)
RDW: 18.9 % — AB (ref 11.5–15.5)
WBC: 10.3 10*3/uL (ref 4.0–10.5)

## 2014-07-14 LAB — MAGNESIUM: Magnesium: 2.1 mg/dL (ref 1.7–2.4)

## 2014-07-14 LAB — CARBOXYHEMOGLOBIN
Carboxyhemoglobin: 1.2 % (ref 0.5–1.5)
Methemoglobin: 1 % (ref 0.0–1.5)
O2 Saturation: 76.9 %
Total hemoglobin: 9.2 g/dL — ABNORMAL LOW (ref 13.5–18.0)

## 2014-07-14 LAB — COMPREHENSIVE METABOLIC PANEL
ALT: 16 U/L — ABNORMAL LOW (ref 17–63)
AST: 26 U/L (ref 15–41)
Albumin: 2.8 g/dL — ABNORMAL LOW (ref 3.5–5.0)
Alkaline Phosphatase: 50 U/L (ref 38–126)
Anion gap: 10 (ref 5–15)
BILIRUBIN TOTAL: 0.6 mg/dL (ref 0.3–1.2)
BUN: 34 mg/dL — ABNORMAL HIGH (ref 6–20)
CO2: 25 mmol/L (ref 22–32)
CREATININE: 2.08 mg/dL — AB (ref 0.61–1.24)
Calcium: 9.3 mg/dL (ref 8.9–10.3)
Chloride: 113 mmol/L — ABNORMAL HIGH (ref 101–111)
GFR, EST AFRICAN AMERICAN: 36 mL/min — AB (ref >60)
GFR, EST NON AFRICAN AMERICAN: 31 mL/min — AB (ref >60)
GLUCOSE: 120 mg/dL — AB (ref 65–99)
POTASSIUM: 3.5 mmol/L (ref 3.5–5.1)
Sodium: 148 mmol/L — ABNORMAL HIGH (ref 135–145)
Total Protein: 7 g/dL (ref 6.5–8.1)

## 2014-07-14 LAB — HEPARIN LEVEL (UNFRACTIONATED): HEPARIN UNFRACTIONATED: 0.49 [IU]/mL (ref 0.30–0.70)

## 2014-07-14 MED ORDER — FUROSEMIDE 10 MG/ML IJ SOLN
INTRAMUSCULAR | Status: AC
  Filled 2014-07-14: qty 4

## 2014-07-14 MED ORDER — METOPROLOL TARTRATE 1 MG/ML IV SOLN
12.5000 mg | INTRAVENOUS | Status: DC
  Administered 2014-07-14 – 2014-07-15 (×4): 12.5 mg via INTRAVENOUS
  Filled 2014-07-14 (×6): qty 15

## 2014-07-14 MED ORDER — FUROSEMIDE 10 MG/ML IJ SOLN
40.0000 mg | Freq: Once | INTRAMUSCULAR | Status: AC
  Administered 2014-07-14: 40 mg via INTRAVENOUS

## 2014-07-14 MED ORDER — FENTANYL CITRATE (PF) 100 MCG/2ML IJ SOLN
25.0000 ug | INTRAMUSCULAR | Status: DC | PRN
  Administered 2014-07-14 – 2014-07-15 (×5): 25 ug via INTRAVENOUS
  Filled 2014-07-14 (×5): qty 2

## 2014-07-14 NOTE — Progress Notes (Signed)
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   Presented to [facility] hospital on 06/11/2014 complaining of SOB. 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 Also noted to be in a-fib. Evaluated by CT surgery for consideration of high risk AVR. Cath showed mild CAD however cannot visually distal RCA system. Surgery delayed due to recurrent GI bleed treated with EGDx2 and clipping. I discharged the patient to CIR on 6/3 to max out physical therapy before high risk AVR. Underwent dental extraction on 6/10, post op, had VT arrest, s/p CPR and intubation. ID consulted after blood cx came back with enteroccocal infection (likely contamination from central line)  HPI/Subjective: 6/18 A/O x 4 States negative DOE, negative sore mouth. Patient's only concern is he wishes to have ice or drink water. States does not recall pulling out his NG tube.   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 Review of findings from 5/24 cardiac catheterization does not mention thrombosed aortic valve. -AVR dysfunction secondary to one of the leaflets being immobile see TEE results 5/20 below -CT surgery states patient is not currently a candidate for placement until  completed 2 weeks antibiotics and a course of physical therapy at CIR -Not accepted to CIR;-Bid out to SNF  Noncompliant with Coumadin therapy for 3+  months -See thrombosed aortic valve  LUE DVT -Heparin per pharmacy  NSVT/HTN -increase Metoprolol IV 12.5 mg q 4hr -Hydralazine IV 7 mg QID -Hydralazine IV PRN SBP> 160 or DBP> 100  Chronic atrial fibrillation:  - CHA2DS2-Vasc score of 3, continue heparin drip per pharmacy -Rate controlled with IV metoprolol   Acute on chronic renal failure -Cr bumped up slightly today -, Patient is+ 4 L since admission, will try to maintain patient euvolemic; - Lasix 40 mg 1  Severe pharyngeal phase dysphagia - nutrition is key in preparing patient for eventual life-saving valve surgery, continue NG tube feeds  -Failed swallow study 6/15 Speech reevaluated patient on 6/18; failed MBS  -Requested IR place PEG tube however they refused secondary to patient having GI bleeding last month with epinephrine injection and 4 times clips placed. SNF will not except patient with NG tube. -Contacted [facility] and discussed case and need for PEG tube. [name] will evaluate for PEG tube placement.  Poor dentation  - [name] (dental) does not believe patient would tolerate removal of remaining teeth.  -Counseled patient and daughter that this may need to be addressed at later date after valve replaced  Enterococcal bacteremia -Possible contaminant since it was isolated from picc line.  -CT surgery requests patient complete 2 weeks of antibiotics  -Complete 2 weeks of antibiotics with ampicillin  Recent GI bleed. -Hemoglobin stable  H/o Aortic dissection -BP rising have restarted some of patient's BP medication -Initially Maintain SBP<145, will slowly titrate medication and obtain lowest SBP patient will tolerate as his physical condition improves  Hematuria: hgb stable   Code Status: FULL Family  Communication: No family present at time of exam Disposition Plan: Resolution of bacteremia: SNF    Consultants: [name] (PCCM) [name] (cardiology) Cody  Megan [name] (infectious disease) [name] (dental) [name] (cardiothoracic surgery)   Procedure/Significant Events: 5/20 TEE'; - LVEF=55% to 60%.- Aortic valve: St. Jude Mechanical AVR is present. Only one of the leaflets visualized as moving; significantly reduced; other leaflet is immobile.  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 - Mitral valve: moderate regurgitation. - Left atrium: severely dilated. No thrombus in the atrial cavity or appendage. - Right atrium: moderately dilated. Nothrombus in the atrial cavity or appendage. -Tricuspid valve: moderate regurgitation. 5/23 EGD by [name];  - Two gastric cardia erosions without active bleeding, presumed cause of recent melena- Very mild bulbar duodenitis  5/26 repeat EGD by [name]; - Large blood clot and fresh blood removed from the proximal stomach - Gastric cardia erosion with visible vessel treated with epinephrine injection and hemostatic clip -4   5/24 upper extremity Doppler;acute deep vein thrombosis involving axillary and brachial veins LUE -Superficial thrombosis in Basilic vein.    Culture 6/9 MRSA by PCR negative 6/10 blood central line 2 positive enterococcus 6/13 blood left/right foot NGTD   Antibiotics: Ampicillin 6/12 (6/13 is day 1/14)>> Ceftriaxone 6/12>> stopped 6/15  DVT prophylaxis: Heparin gtt   Devices    LINES / TUBES:      Continuous Infusions: . sodium chloride 10 mL/hr at 07/14/14 0700  . feeding supplement (OSMOLITE 1.2 [name]) Stopped (07/14/14 0100)  . heparin 1,300 Units/hr (07/14/14 1008)    Objective: VITAL SIGNS: Temp: 97.9 F (36.6 C) (06/18 1200) Temp Source: Axillary (06/18 1200) BP: 125/65 mmHg (06/18 1200) Pulse Rate: 79 (06/18 1200) SPO2; FIO2:   Intake/Output Summary (Last 24 hours) at 07/14/14 1614 Last data filed at 07/14/14 1500  Gross per  24 hour  Intake    886 ml  Output    380 ml  Net    506 ml     Exam: General: A/O x 4, NAD  No acute respiratory distress Eyes: Negative headache, eye pain, double vision, scotomas, floaters, negative retinal hemorrhage ENT: Negative Runny nose, mild positive gingival bleeding/dried caked on blood,Positive odynophagia Neck:  Negative scars, masses, torticollis, lymphadenopathy, JVD Lungs: Clear to auscultation bilaterally without wheezes or crackles; patient with phlegm and main bronchus which cannot be cleared Cardiovascular:  IRIR rate and rhythm with grade 4/6 systolic murmur (click), normal S1 and S2 Abdomen: negative abdominal pain, negative dysphagia, Nontender, nondistended, soft, bowel sounds positive, no rebound, no ascites, no appreciable mass Extremities: No significant cyanosis, clubbing, or edema bilateral lower extremities Psychiatric:  Negative depression, negative anxiety, negative fatigue, negative mania  Neurologic:  Cranial nerves II through XII intact, tongue/uvula midline, all extremities muscle strength 5/5, sensation intact throughout, positive dysarthria, negative expressive aphasia, negative receptive aphasia.      Data Reviewed: Basic Metabolic Panel:  Recent Labs Lab 07/08/14 0400 07/10/14 0420 07/11/14 0310 07/12/14 0400 07/13/14 0340 07/14/14 0330  NA 134* 141 143 145 145 148*  K 4.1 4.0 3.7 3.6 3.5 3.5  CL 104 109 108 108 112* 113*  CO2 23 24 23 22 24 25   GLUCOSE 113* 100* 101* 122* 171* 120*  BUN 22* 18 18 22* 29* 34*  CREATININE 2.18* 1.52* 1.58* 1.59* 1.87* 2.08*  CALCIUM 8.6* 9.4 9.2 9.2 9.3 9.3  MG 2.0  --  1.7 1.8 1.9 2.1  PHOS 4.2  --   --   --   --   --    Liver Function Tests:  Recent Labs Lab 07/11/14 0310 07/12/14 0400 07/13/14 0340 07/14/14 0330  AST 21 21 25 26   ALT 10* 13* 16* 16*  ALKPHOS 46 44 45 50  BILITOT 1.0 0.8 0.8 0.6  PROT 6.9 7.0 7.4 7.0  ALBUMIN 2.6* 2.7* 2.9* 2.8*   No results for input(s): LIPASE,  AMYLASE in the last 168 hours. No results for input(s): AMMONIA in the last 168 hours. CBC:  Recent Labs Lab 07/10/14 0420 07/11/14 0310 07/12/14 0400 07/13/14 0340 07/14/14 0330  WBC 6.9 6.6 7.4 7.9 10.3  NEUTROABS  --  4.2 4.8 5.9 7.4  HGB 8.3* 8.6* 8.9* 9.3* 8.9*  HCT 26.4* 28.1* 28.5* 29.8* 29.4*  MCV 88.3 87.5 86.6 89.0 88.3  PLT 313 316 310 345 348   Cardiac Enzymes: No results for input(s): CKTOTAL, CKMB, CKMBINDEX, TROPONINI in the last 168 hours. BNP (last 3 results)  Recent Labs  06/14/14 2105  BNP 1438.6*    ProBNP (last 3 results) No results for input(s): PROBNP in the last 8760 hours.  CBG:  Recent Labs Lab 07/09/14 0346 07/09/14 0840 07/09/14 0847 07/09/14 1145 07/10/14 1620  GLUCAP 83 68 81 83 102*    Recent Results (from the past 240 hour(s))  MRSA PCR Screening     Status: None   Collection Time: 06/29/2014  5:00 PM  Result Value Ref Range Status   MRSA by PCR NEGATIVE NEGATIVE Final    Comment:        The GeneXpert MRSA Assay (FDA approved for NASAL specimens only), is one component of a comprehensive MRSA colonization surveillance program. It is not intended to diagnose MRSA infection nor to guide or monitor treatment for MRSA infections.   Culture, blood (routine x 2)     Status: None   Collection Time: 07/06/14  4:20 PM  Result Value Ref Range Status   Specimen Description BLOOD CENTRAL LINE  Final   Special Requests BOTTLES DRAWN AEROBIC ONLY 10CC  Final   Culture   Final    ENTEROCOCCUS SPECIES [name]: SUSCEPTIBILITIES PERFORMED ON PREVIOUS CULTURE WITHIN THE LAST 5 DAYS. [name]: Gram Stain Report Called to,Read Back By and Verified With: A AFATSAWO 07/07/14 1055 BY [email or web address] Performed at [facility]    Report Status 07/09/2014 FINAL  Final  Culture, blood (routine x 2)     Status: None   Collection Time: 07/06/14  4:25 PM  Result Value Ref Range Status   Specimen Description BLOOD CENTRAL LINE  Final   Special  Requests BOTTLES DRAWN AEROBIC ONLY 10CC  Final   Culture   Final    ENTEROCOCCUS SPECIES [name]: Gram Stain Report Called to,Read Back By and Verified With: A AFATSAWO 07/07/14 1055 BY [email or web address] Performed at [facility]    Report Status 07/09/2014 FINAL  Final   Organism ID, Bacteria ENTEROCOCCUS SPECIES  Final      Susceptibility   Enterococcus species - MIC*    AMPICILLIN <=2 SENSITIVE Sensitive     VANCOMYCIN 2 SENSITIVE Sensitive     * ENTEROCOCCUS SPECIES  Culture, blood (routine x 2)     Status: None (Preliminary result)   Collection Time: 07/09/14  4:20 PM  Result Value Ref Range Status   Specimen Description BLOOD LEFT FOOT  Final   Special Requests BOTTLES DRAWN AEROBIC ONLY 10CC  Final  Culture   Final           BLOOD CULTURE RECEIVED NO GROWTH TO DATE CULTURE WILL BE HELD FOR 5 DAYS BEFORE ISSUING A FINAL NEGATIVE REPORT Performed at [facility]    Report Status PENDING  Incomplete  Culture, blood (routine x 2)     Status: None (Preliminary result)   Collection Time: 07/09/14  4:30 PM  Result Value Ref Range Status   Specimen Description BLOOD RIGHT FOOT  Final   Special Requests BOTTLES DRAWN AEROBIC ONLY 3CC  Final   Culture   Final           BLOOD CULTURE RECEIVED NO GROWTH TO DATE CULTURE WILL BE HELD FOR 5 DAYS BEFORE ISSUING A FINAL NEGATIVE REPORT Performed at [facility]    Report Status PENDING  Incomplete     Studies:  Recent x-ray studies have been reviewed in detail by the Attending Physician  Scheduled Meds:  Scheduled Meds: . ampicillin (OMNIPEN) IV  2 g Intravenous 6 times per day  . antiseptic oral rinse  7 mL Mouth Rinse q12n4p  . chlorhexidine  15 mL Mouth/Throat BID  . hydrALAZINE  7 mg Intravenous Q6H  . metoprolol  12.5 mg Intravenous Q4H  . pantoprazole (PROTONIX) IV  40 mg Intravenous Q12H    Time spent on care of this patient: 40 mins   WOODS, Geraldo Docker , MD  [facility] Office   [phone] Pager - [phone]  On-Call/Text Page:      [email or web address]      password [email or web address]  If 7PM-7AM, please contact night-coverage [email or web address] Password [email or web address] 07/14/2014, 4:14 PM   LOS: 9 days   Care during the described time interval was provided by me .  I have reviewed this patient's available data, including medical history, events of [name], physical examination, and all test results as part of my evaluation. I have personally reviewed and interpreted all radiology studies.   Dia Crawford, MD [phone] Pager

## 2014-07-14 NOTE — Progress Notes (Signed)
Patient ID: MD SMOLA, male   DOB: [birth year], 69 y.o.   MRN: [id number]    Patient Name: [name] Date of Encounter: 07/14/2014     Principal Problem:   Positive blood cultures Active Problems:   Aortic valve insufficiency   Hx of aortic aneurysm repair   Atrial fibrillation, do not know length of time   Gastric erosion   Acute respiratory failure, unspecified whether with hypoxia or hypercapnia   Anemia   Ventricular tachycardia   Essential hypertension   H/O aortic valve replacement   Acute renal insufficiency   AI (aortic insufficiency)   Urinary tract infection due to Enterococcus   Enterococcal bacteremia   Cardiac arrest   Aortic valve incompetence   H/O noncompliance with medical treatment, presenting hazards to health   DVT of upper extremity (deep vein thrombosis)   NSVT (nonsustained ventricular tachycardia)   Chronic atrial fibrillation   Acute on chronic renal failure   Hematemesis with nausea   Aortic valve vegetation    SUBJECTIVE  C/o incisional pain. Also c/o sob.  CURRENT MEDS . ampicillin (OMNIPEN) IV  2 g Intravenous 6 times per day  . antiseptic oral rinse  7 mL Mouth Rinse q12n4p  . chlorhexidine  15 mL Mouth/Throat BID  . hydrALAZINE  7 mg Intravenous Q6H  . metoprolol  10 mg Intravenous Q4H  . pantoprazole (PROTONIX) IV  40 mg Intravenous Q12H    OBJECTIVE  Filed Vitals:   07/14/14 0037 07/14/14 0400 07/14/14 0800 07/14/14 1200  BP:  143/97 151/97 125/65  Pulse:  59 95 79  Temp: 98.1 F (36.7 C)  98.3 F (36.8 C) 97.9 F (36.6 C)  TempSrc: Oral  Oral Axillary  Resp:   18   Height:      Weight:   232 lb 5.8 oz (105.4 kg)   SpO2:  97% 100% 97%    Intake/Output Summary (Last 24 hours) at 07/14/14 1343 Last data filed at 07/14/14 1300  Gross per 24 hour  Intake   1104 ml  Output    420 ml  Net    684 ml   Filed Weights   07/12/14 0600 07/13/14 0800 07/14/14 0800  Weight: 231 lb 7.7 oz (105 kg) 230 lb 13.2 oz  (104.7 kg) 232 lb 5.8 oz (105.4 kg)    PHYSICAL EXAM  General: chronically ill appearing, NAD. Neuro: Alert and oriented X 3. Moves all extremities spontaneously. Psych: seems anxious HEENT:  Normal, wearing glassess, poor dentition.  Neck: Supple without bruits or JVD. Lungs:  Resp regular and unlabored, but scattered rales. Heart: IRRR no s3, s4, or murmurs. Abdomen: Soft, non-tender, non-distended, BS + x 4.  Extremities: No clubbing, cyanosis or edema. DP/PT/Radials 2+ and equal bilaterally.  Accessory Clinical Findings  CBC  Recent Labs  07/13/14 0340 07/14/14 0330  WBC 7.9 10.3  NEUTROABS 5.9 7.4  HGB 9.3* 8.9*  HCT 29.8* 29.4*  MCV 89.0 88.3  PLT 345 709   Basic Metabolic Panel  Recent Labs  07/13/14 0340 07/14/14 0330  NA 145 148*  K 3.5 3.5  CL 112* 113*  CO2 24 25  GLUCOSE 171* 120*  BUN 29* 34*  CREATININE 1.87* 2.08*  CALCIUM 9.3 9.3  MG 1.9 2.1   Liver Function Tests  Recent Labs  07/13/14 0340 07/14/14 0330  AST 25 26  ALT 16* 16*  ALKPHOS 45 50  BILITOT 0.8 0.6  PROT 7.4 7.0  ALBUMIN 2.9* 2.8*   No  results for input(s): LIPASE, AMYLASE in the last 72 hours. Cardiac Enzymes No results for input(s): CKTOTAL, CKMB, CKMBINDEX, TROPONINI in the last 72 hours. BNP Invalid input(s): POCBNP D-Dimer No results for input(s): DDIMER in the last 72 hours. Hemoglobin A1C No results for input(s): HGBA1C in the last 72 hours. Fasting Lipid Panel No results for input(s): CHOL, HDL, LDLCALC, TRIG, CHOLHDL, LDLDIRECT in the last 72 hours. Thyroid Function Tests No results for input(s): TSH, T4TOTAL, T3FREE, THYROIDAB in the last 72 hours.  Invalid input(s): FREET3  TELE  Atrial fib with a controlled Vr  Radiology/Studies  Dg Orthopantogram  06/19/2014   CLINICAL DATA:  Aortic stenosis, preop  EXAM: ORTHOPANTOGRAM/PANORAMIC  COMPARISON:  None available  FINDINGS: Multiple missing teeth and restorations. Caries involving teeth 3 and 4.  Periapical lucency/erosion involving tooth number 13.  IMPRESSION: 1. Dental caries and periapical disease as above.   Electronically Signed   By: Lucrezia Europe [name]   On: 06/19/2014 16:52   Dg Knee 1-2 Views Left  07/03/2014   CLINICAL DATA:  70 year old male with generalized left knee pain and swelling progressive the past few days.  EXAM: LEFT KNEE - 1-2 VIEW  COMPARISON:  None.  FINDINGS: Suspect small suprapatellar joint effusion. No evidence acute fracture or malalignment. There is narrowing of the lateral compartmental joint space with subchondral sclerosis and subtle irregularity of the cortical surface of the lateral femoral condyle and lateral tibial plateau. Mild osteophyte formation in the patellofemoral compartment. No evidence of acute fracture or malalignment. No focal soft tissue abnormality.  IMPRESSION: 1. Small suprapatellar knee joint effusion which may be degenerative, or infectious. 2. Degenerative changes relatively focal to the lateral compartments with significant narrowing of the joint space and subtle irregularity of the cortical margin of the lateral femoral condyle and lateral tibial plateau. This is favored to represent advanced degenerative change with chondromalacia and secondary bony changes.   Electronically Signed   By: Jacqulynn Cadet [name]   On: 07/03/2014 18:43   Dg Abd 1 View  07/11/2014   CLINICAL DATA:  Feeding tube placement  EXAM: ABDOMEN - 1 VIEW  COMPARISON:  July 06, 2014  FINDINGS: Feeding tube tip is in the gastric cardia just beyond the gastroesophageal junction. Bowel gas pattern is normal. There are surgical clips in the upper abdominal region.  IMPRESSION: Feeding tube tip is just beyond the gastroesophageal junction in the gastric cardia region. It may be prudent to advance the feeding tube 8-10 cm to increase security that the tube will remain in the stomach given its current quite proximal gastric location. Normal gas pattern.   Electronically Signed   By:  Lowella Grip III [name]   On: 07/11/2014 11:26   Ct Head Wo Contrast  06/19/2014   CLINICAL DATA:  Patient with cognitive deficits and expressive aphasia.  EXAM: CT HEAD WITHOUT CONTRAST  TECHNIQUE: Contiguous axial images were obtained from the base of the skull through the vertex without intravenous contrast.  COMPARISON:  None.  FINDINGS: Ventricles and sulci are appropriate for patient's age. No evidence for acute cortically based infarct, intracranial hemorrhage, mass lesion or mass-effect. Orbits are unremarkable. Polypoid mucosal thickening bilateral maxillary sinuses. Frontal sinus and ethmoid air cells are unremarkable. Mastoid air cells are well aerated. Calvarium is intact. Zygomatic arches are unremarkable.  IMPRESSION: No acute intracranial process.  Polypoid mucosal thickening paranasal sinuses.   Electronically Signed   By: Lovey Newcomer [name]   On: 06/19/2014 15:30   Dg Chest  Port 1 View  07/14/2014   CLINICAL DATA:  69 year old male for evaluation of feeding tube placement. Initial encounter.  EXAM: PORTABLE CHEST - 1 VIEW  COMPARISON:  Abdominal radiograph 2314 hr today. Chest CTA 06/13/2014.  FINDINGS: The feeding tube tip projects just above the left hemidiaphragm along the left cardiophrenic angle (arrow). Abnormal mediastinal contour re- demonstrated and on 06/13/2014 a dissected an enlarged thoracic aorta was demonstrated. Lower lung volumes. Increased veiling opacity at the right lung base. No superimposed pneumothorax. Increased pulmonary vascularity without overt edema. Right side PICC line appears stable. Superimposed pacing or resuscitation pads and EKG leads.  IMPRESSION: 1. Feeding tube tip projects at the level of the distal thoracic esophagus just above the diaphragm. 2. Abnormal cardiac and mediastinal contours related to cardiomegaly and thoracic aortic dissection and aneurysmal enlargement as seen on 06/13/2014. 3. Increased right pleural effusion.   Electronically Signed    By: Genevie Ann [name]   On: 07/14/2014 00:10   Dg Chest Port 1 View  07/11/2014   CLINICAL DATA:  Shortness of Breath  EXAM: PORTABLE CHEST - 1 VIEW  COMPARISON:  July 08, 2014  FINDINGS: Nasogastric tube and endotracheal tube have been removed. Central catheter tip is in the superior vena cava. No pneumothorax.  There is no edema or consolidation currently. There is a small right effusion. There is persistent cardiomegaly. The pulmonary vascularity is within normal limits. No adenopathy.  IMPRESSION: No pneumothorax. Cardiomegaly with small right effusion. There may be a mild degree of residual congestive heart failure. There is no edema or consolidation, however.   Electronically Signed   By: Lowella Grip III [name]   On: 07/11/2014 07:22   Dg Chest Port 1 View  07/08/2014   CLINICAL DATA:  69 year old male with respiratory failure  EXAM: PORTABLE CHEST - 1 VIEW  COMPARISON:  Prior chest x-ray 07/07/2014  FINDINGS: The endotracheal tube tip is 4 cm above the carina. A right upper extremity PICC is present. The catheter tip projects over the superior cavoatrial junction. A nasogastric tube is present. The tip of the tube lies below the diaphragm off the field of view but likely within the stomach. Stable cardiomegaly and aneurysmal dilatation of the aorta. No pneumothorax. Small right layering pleural effusion again noted. Overall, improved inspiratory volumes with decreased pulmonary vascular congestion and decreasing bibasilar atelectasis.  IMPRESSION: 1. Improved inspiratory volumes with decreasing pulmonary vascular congestion and improving bibasilar atelectasis. 2. Stable and satisfactory support apparatus. 3. Stable cardiomegaly and descending thoracic aortic aneurysm.   Electronically Signed   By: Jacqulynn Cadet [name]   On: 07/08/2014 08:36   Dg Chest Port 1 View  07/07/2014   CLINICAL DATA:  Respiratory failure  EXAM: PORTABLE CHEST - 1 VIEW  COMPARISON:  Six hundred ten  FINDINGS: Endotracheal  tube is 4.2 cm above the carina. The nasogastric tube extends into the stomach. There is a satisfactorily positioned right upper extremity PICC line with tip in the SVC. There is no pneumothorax. There is improvement, with partial clearance of basilar opacities and with better definition of diaphragmatic contours. Moderate opacity persists in the left base.  IMPRESSION: Support equipment appears satisfactorily positioned.  Improved, with partial clearance of basilar opacities.   Electronically Signed   By: Andreas Newport [name]   On: 07/07/2014 06:37   Dg Chest Port 1 View  07/06/2014   CLINICAL DATA:  Respiratory failure.  EXAM: PORTABLE CHEST - 1 VIEW  COMPARISON:  06/24/2014 .  FINDINGS: Endotracheal  tube noted with its tip 1.1 cm above the carina. Right PICC line noted with tip at the cavoatrial junction. Right IJ line in stable position with tip projected over the cavoatrial junction. Cardiomegaly normal pulmonary vascularity. Bibasilar atelectasis and/or infiltrates with small bilateral pleural effusions. No pneumothorax .  IMPRESSION: 1. Interim placement of endotracheal tube, its tip is 1.1 cm above the carina. Proximal repositioning of approximately 2 cm should be considered. 2. Right IJ line and right PICC line in good anatomic position. 3. Stable cardiomegaly. 4. Bibasilar atelectasis and/or infiltrates with possible small bilateral pleural effusions. Critical Value/emergent results were called by telephone at the time of interpretation on 07/06/2014 at 3:59 pm to nurse Amy , who verbally acknowledged these results.   Electronically Signed   By: [place]   On: 07/06/2014 16:06   Dg Chest Port 1 View  06/24/2014   CLINICAL DATA:  Shortness of breath.  Subsequent exam.  EXAM: PORTABLE CHEST - 1 VIEW  COMPARISON:  06/21/2014  FINDINGS: Since the previous exam, the endotracheal tube has been removed. Right internal jugular central venous line is stable and well positioned.  Stable cardiomegaly.  Minor basilar lung opacity consistent with atelectasis. No convincing pneumonia and no pulmonary edema. No pneumothorax.  Stable elevation the right hemidiaphragm.  IMPRESSION: 1. No acute cardiopulmonary disease. 2. Stable cardiomegaly. Minor lung base atelectasis. Right IJ central venous line is stable in well positioned.   Electronically Signed   By: Lajean Manes [name]   On: 06/24/2014 08:49   Dg Chest Portable 1 View  06/21/2014   CLINICAL DATA:  Status post EGD for upper GI bleed, evaluate ETT  EXAM: PORTABLE CHEST - 1 VIEW  COMPARISON:  06/16/2014  FINDINGS: Endotracheal tube terminates 4 cm above the carina.  Mild blunting of the left costophrenic angle. Mild left basilar atelectasis. Lungs otherwise clear. No pleural effusion or pneumothorax.  Cardiomegaly.  Right IJ venous catheter terminates at the cavoatrial junction.  IMPRESSION: Endotracheal tube terminates 4 cm above the carina.  Right IJ venous catheter terminates at the cavoatrial junction.  No pneumothorax.   Electronically Signed   By: Julian Hy [name]   On: 06/21/2014 16:25   Dg Chest Port 1 View  06/16/2014   CLINICAL DATA:  Central line placement.  EXAM: PORTABLE CHEST - 1 VIEW  COMPARISON:  06/14/2014  FINDINGS: New right jugular central line is been placed with the tip in the upper right atrium. No pneumothorax. Stable volume loss of the right lung with slightly more prominent mid lung atelectasis. Mild interstitial edema suspected. No pleural effusions. Stable cardiac enlargement.  IMPRESSION: Central line tip in upper right atrium. No pneumothorax. Slightly more prominent right mid lung atelectasis. Mild interstitial edema suspected.   Electronically Signed   By: Aletta Edouard [name]   On: 06/16/2014 13:58   Dg Chest Port 1 View  06/14/2014   CLINICAL DATA:  CHF exacerbation, history hypertension and prior AVR  EXAM: PORTABLE CHEST - 1 VIEW  COMPARISON:  Portable exam 2010 hours compared to 06/13/2014  FINDINGS: Enlargement of  cardiac silhouette post median sternotomy.  Tortuous aorta.  Mediastinal contours and pulmonary vascularity normal.  Minimal bibasilar atelectasis.  No gross infiltrate, pleural effusion or pneumothorax.  IMPRESSION: Enlargement of cardiac silhouette with minimal bibasilar atelectasis.   Electronically Signed   By: Lavonia Dana [name]   On: 06/14/2014 20:50   Dg Abd Portable 1v  07/13/2014   ADDENDUM REPORT: 07/13/2014 23:38  ADDENDUM: CORRECTED IMPRESSION:  Normal NON obstructive bowel gas pattern. Interval removal of enteric  tube.   Electronically Signed   By: Lucienne Capers [name]   On: 07/13/2014 23:38   07/13/2014   CLINICAL DATA:  Abdominal pain tonight.  EXAM: PORTABLE ABDOMEN - 1 VIEW  COMPARISON:  07/12/2014  FINDINGS: The enteric tube appears to been removed since the previous study. Visualized bowel gas pattern is normal. Stool in the colon. No small or large bowel distention. No radiopaque stones. Surgical clips in the abdomen and left pelvis. Vascular calcifications.  IMPRESSION: Normal on obstructive bowel gas pattern. Interval removal of enteric tube.  Electronically Signed: By: Lucienne Capers [name] On: 07/13/2014 23:23   Dg Abd Portable 1v  07/12/2014   CLINICAL DATA:  NG tube placement.  EXAM: PORTABLE ABDOMEN - 1 VIEW  COMPARISON:  07/02/2014 at 13:45 p.m.  FINDINGS: Examination again demonstrates patient's enteric tube which has been advanced and has tip over the left upper quadrant likely within the mid body of the stomach. Remainder of the exam is unchanged.  IMPRESSION: Enteric tube with tip over the left upper quadrant likely within the mid body of the stomach.   Electronically Signed   By: Marin Olp [name]   On: 07/12/2014 14:15   Dg Abd Portable 1v  07/12/2014   CLINICAL DATA:  NG placement  EXAM: PORTABLE ABDOMEN - 1 VIEW  COMPARISON:  07/11/2014  FINDINGS: Feeding tube is in the body the stomach similar to yesterday. Normal bowel gas pattern. No dilated bowel loops.  IMPRESSION:  Feeding tube tip in the body the stomach.   Electronically Signed   By: Franchot Gallo [name]   On: 07/12/2014 14:11   Dg Abd Portable 1v  07/12/2014   CLINICAL DATA:  NG tube placement.  EXAM: PORTABLE ABDOMEN - 1 VIEW  COMPARISON:  07/11/2014  FINDINGS: Feeding tube tip is demonstrated in the left upper quadrant just distal to the esophagogastric junction. No significant change in position since previous study. Surgical clips in the upper abdomen. Visualized gas-filled bowel loops are not distended.  IMPRESSION: No change in position of feeding tube with tip just distal to the expected location of the esophagogastric junction.   Electronically Signed   By: Lucienne Capers [name]   On: 07/12/2014 02:03   Dg Abd Portable 1v  07/06/2014   CLINICAL DATA:  Nasogastric catheter placement  EXAM: PORTABLE ABDOMEN - 1 VIEW  COMPARISON:  None.  FINDINGS: Scattered large and small bowel gas is noted. A nasogastric catheter is noted within the distal aspect of the stomach in satisfactory position.  IMPRESSION: Nasogastric catheter in satisfactory position.   Electronically Signed   By: Inez Catalina [name]   On: 07/06/2014 19:43   Dg Swallowing Func-speech Pathology  07/14/2014    Objective Swallowing Evaluation:    Patient Details  Name: [name] MRN: [id number] Date of Birth: [birth year]  Today's Date: 07/14/2014 Time: SLP Start Time (ACUTE ONLY): 10:31-SLP Stop Time (ACUTE ONLY): 10:50 SLP Time Calculation (min) (ACUTE ONLY): 19 min  Past Medical History:  Past Medical History  Diagnosis Date  . Chest pain   . Hypertension   . Aortic aneurysm 1993  . 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 Pneumonia   . Aortic valve replaced      05/2014 transfered from [place] to [facility] for HF in the setting  of failed valve repair. Due to thrombosis of valve after pt stopped  coumadin. CT surgery considering  AVR, delayed due to GI bleed  . Hx of aortic aneurysm repair 06/14/2014  . Atrial fibrillation 05/2014  .  Gastritis and gastroduodenitis with hemorrhage 05/2014    severe.   . Tubular adenoma of colon 12/2012  . DVT of upper extremity (deep vein thrombosis) 05/2014    left  . Acute blood loss anemia 05/2014   . CAD (coronary artery disease)     minimal CAD on cath 05/2014   Past Surgical History:  Past Surgical History  Procedure Laterality Date  . Abdominal aortic aneurysm repair  1993    At [facility], Unspecified  . Splenectomy    . Cardiac valve replacement      At [facility]  . Esophagogastroduodenoscopy  Aug 2014    [facility]: large duodenal ulcer with visible vessel, s/p clip and epi,  erosive gastritis. +h.pylori serology, treated with amoxicillin and  biaxin.   . Colonoscopy N/A 12/27/2012    Procedure: COLONOSCOPY;  Surgeon: Danie Binder, MD;  Location[facility];  Service: Endoscopy;  Laterality: N/A;  8:45-moved to 855   . Tee without cardioversion N/A 06/15/2014    Procedure: TRANSESOPHAGEAL ECHOCARDIOGRAM (TEE);  Surgeon: Sueanne Margarita, MD;  Location: [facility] ENDOSCOPY;  Service: Cardiovascular;   Laterality: N/A;  . Cardiac catheterization N/A 06/15/2014    Procedure: Fluoroscopy Guidance;  Surgeon: Josue Hector, MD;   Location: [name] CV LAB;  Service: Cardiovascular;  Laterality: N/A;  . Esophagogastroduodenoscopy N/A 06/18/2014    Procedure: ESOPHAGOGASTRODUODENOSCOPY (EGD);  Surgeon: Jerene Bears, MD;   Location: [facility] ENDOSCOPY;  Service: Endoscopy;  Laterality: N/A;  . Peripheral vascular catheterization  06/19/2014    Procedure: Aortic Arch Angiography;  Surgeon: Jettie Booze, MD;   Location: [place] CV LAB;  Service: Cardiovascular;;  . Esophagogastroduodenoscopy N/A 06/21/2014    Procedure: ESOPHAGOGASTRODUODENOSCOPY (EGD);  Surgeon: Jerene Bears, MD;   Location: [facility] ENDOSCOPY;  Service: Endoscopy;  Laterality: N/A;  . Esophagogastroduodenoscopy N/A 06/21/2014    Procedure: ESOPHAGOGASTRODUODENOSCOPY (EGD);  Surgeon: Jerene Bears, MD;   Location: [place];  Service:  Gastroenterology;  Laterality: N/A;  . Multiple extractions with alveoloplasty N/A 07/14/2014    Procedure: Extraction of tooth #'s 3,4,5,13,14,21,23,24,25,26, with  alveoloplasty and gross debridement of remaining teeth.;  Surgeon: [name], DDS;  Location: [place];  Service: Oral Surgery;  Laterality:  N/A;   HPI:  Other Pertinent Information: 69 y.o. male with PMH significant for A fib,  History of aortic aneurysm repair, Aortic valve insufficiency, [employer]  mechanical Valve with malfunction, last hospitalization 2 weeks ago for  cardiogenic shock from malfunctioning valve, GI bleed from gastric cardiac  erosion, he was discharge to [facility] for Rehab. Plan to have AVR redo by Dr  Nils Pyle at the end of June. He underwent Multiple tooth extractions on  06/27/2014. Post op complicated by VT arrest requiring ~ 2 min of CPR.  Intubated 07/06/14. Extubated 07/08/14.Continues to have copious oral  bleeding, pt is on heparin.   No Data Recorded  Assessment / Plan / Recommendation CHL IP CLINICAL IMPRESSIONS 07/14/2014  Therapy Diagnosis Moderate oral phase dysphagia;Moderate pharyngeal phase  dysphagia  Clinical Impression Pt with congested breathing and stridor upon exhale  prior to study with wet vocal quality concerning for poor secretion  managment. Pts swallow function initially appearing improved compared to  FEES report on 07-10-14. However as study progressed pt exhibited poor  protection of airway with residuals in valleculae filling  up and spilling  into the laryngeal vestibule to become silently aspirated with ineffective  volitional cough. Cueing for additional swallow ineffective at clearing  residuals with fatigue and weakness noted.  Continue NPO at this time with  therapeutic PO trials with SLP only. Recommend medicine via alternative  means. ST to follow up.       CHL IP TREATMENT RECOMMENDATION 07/14/2014  Treatment Recommendations Therapy as outlined in treatment plan below     CHL IP DIET  RECOMMENDATION 07/14/2014  SLP Diet Recommendations NPO  Liquid Administration via (None)  Medication Administration Via alternative means  Compensations (None)  Postural Changes and/or Swallow Maneuvers (None)     CHL IP OTHER RECOMMENDATIONS 07/14/2014  Recommended Consults (None)  Oral Care Recommendations Oral care QID  Other Recommendations (None)     CHL IP FOLLOW UP RECOMMENDATIONS 07/11/2014  Follow up Recommendations Skilled Nursing facility     [facility] IP FREQUENCY AND DURATION 07/10/2014  Speech Therapy Frequency (ACUTE ONLY) min 2x/week  Treatment Duration 2 weeks     Pertinent Vitals/Pain     SLP Swallow Goals No flowsheet data found.  No flowsheet data found.    CHL IP REASON FOR REFERRAL 07/10/2014  Reason for Referral Objectively evaluate swallowing function     CHL IP ORAL PHASE 07/14/2014  Lips (None)  Tongue (None)  Mucous membranes (None)  Nutritional status (None)  Other (None)  Oxygen therapy (None)  Oral Phase Impaired  Oral - Pudding Teaspoon (None)  Oral - Pudding Cup (None)  Oral - Honey Teaspoon (None)  Oral - Honey Cup (None)  Oral - Honey Syringe (None)  Oral - Nectar Teaspoon (None)  Oral - Nectar Cup (None)  Oral - Nectar Straw (None)  Oral - Nectar Syringe (None)  Oral - Ice Chips (None)  Oral - Thin Teaspoon (None)  Oral - Thin Cup (None)  Oral - Thin Straw (None)  Oral - Thin Syringe (None)  Oral - Puree (None)  Oral - Mechanical Soft (None)  Oral - Regular (None)  Oral - Multi-consistency (None)  Oral - Pill (None)  Oral Phase - Comment (None)      CHL IP PHARYNGEAL PHASE 07/14/2014  Pharyngeal Phase Impaired  Pharyngeal - Pudding Teaspoon (None)  Penetration/Aspiration details (pudding teaspoon) (None)  Pharyngeal - Pudding Cup (None)  Penetration/Aspiration details (pudding cup) (None)  Pharyngeal - Honey Teaspoon (None)  Penetration/Aspiration details (honey teaspoon) (None)  Pharyngeal - Honey Cup (None)  Penetration/Aspiration details (honey cup) (None)  Pharyngeal - Honey Syringe (None)   Penetration/Aspiration details (honey syringe) (None)  Pharyngeal - Nectar Teaspoon (None)  Penetration/Aspiration details (nectar teaspoon) (None)  Pharyngeal - Nectar Cup (None)  Penetration/Aspiration details (nectar cup) (None)  Pharyngeal - Nectar Straw (None)  Penetration/Aspiration details (nectar straw) (None)  Pharyngeal - Nectar Syringe (None)  Penetration/Aspiration details (nectar syringe) (None)  Pharyngeal - Ice Chips (None)  Penetration/Aspiration details (ice chips) (None)  Pharyngeal - Thin Teaspoon (None)  Penetration/Aspiration details (thin teaspoon) (None)  Pharyngeal - Thin Cup (None)  Penetration/Aspiration details (thin cup) (None)  Pharyngeal - Thin Straw (None)  Penetration/Aspiration details (thin straw) (None)  Pharyngeal - Thin Syringe (None)  Penetration/Aspiration details (thin syringe') (None)  Pharyngeal - Puree (None)  Penetration/Aspiration details (puree) (None)  Pharyngeal - Mechanical Soft (None)  Penetration/Aspiration details (mechanical soft) (None)  Pharyngeal - Regular (None)  Penetration/Aspiration details (regular) (None)  Pharyngeal - Multi-consistency (None)  Penetration/Aspiration details (multi-consistency) (None)  Pharyngeal - Pill (None)  Penetration/Aspiration details (pill) (None)  Pharyngeal Comment (None)      No flowsheet data found.  No flowsheet data found.        Arvil Chaco MA, CCC-SLP Acute Care Speech Language Pathologist    Levi Aland 07/14/2014, 11:14 AM     ASSESSMENT AND PLAN  1. Acute on chronic diastolic heart failure - he is mildly volume overloaded. Will give IV lasix 2. VT - no strips but he was said to have VT after dental extractions.  Will hold off on anti-arrhythmic medications for now.  3. 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 Dysphagia - results of swallowing study  noted. He will need to be made npo with IV meds given 6. Atrial fib - his ventricular rate is controlled. Will follow.  Magaby Rumberger,[name]  07/14/2014 1:43 PM

## 2014-07-14 NOTE — Progress Notes (Signed)
IR received request for percutaneous gastrostomy tube placement in patient with dysphagia. Reviewed case with Dr. Barbie Banner and patient noted to have recent GI bleed with EGD done 06/21/14 with gastric cardia erosion and visible bleeding s/p epinephrine injection and 4 hemostatic clips. IR would defer G-tube placement to GI with recent gastric bleeding.  Tsosie Billing PA-C Interventional Radiology  07/14/14  12:48 PM

## 2014-07-14 NOTE — Progress Notes (Signed)
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  Estimated Creatinine Clearance: 40.4 mL/min (by C-G formula based on Cr of 2.08).   Assessment: 69 yo male with Afib/AVR, s/p tooth extraction last week.   Heparin held for most of 6/14 d/t ongoing oral bleeding this has resolved.  Hgb stable at 8.9, no bleeding issues noted. HL 0.49 at goal of 0.3 - 0.5. Per dental, future teeth extractions unlikely.  Goal of Therapy:  Heparin level 0.3-0.5 Monitor platelets by anticoagulation protocol: Yes   Plan:  Continue heparin 1300 units/hr Daily HL, CBC F/u long-term anticoag plan   Braylen Denunzio E. Trannie Bardales, Pharm.D Clinical Pharmacy Resident Pager: [phone] 07/14/2014 7:28 AM

## 2014-07-14 NOTE — Progress Notes (Signed)
MBS report now available under imaging section  Cody Seyfried Sumney MA, CCC-SLP Acute Care Speech Language Pathologist    

## 2014-07-15 DIAGNOSIS — IMO0002 Reserved for concepts with insufficient information to code with codable children: Secondary | ICD-10-CM | POA: Diagnosis present

## 2014-07-15 DIAGNOSIS — K922 Gastrointestinal hemorrhage, unspecified: Secondary | ICD-10-CM

## 2014-07-15 DIAGNOSIS — R69 Illness, unspecified: Secondary | ICD-10-CM

## 2014-07-15 DIAGNOSIS — Z91199 Patient's noncompliance with other medical treatment and regimen due to unspecified reason: Secondary | ICD-10-CM | POA: Diagnosis present

## 2014-07-15 DIAGNOSIS — Z9119 Patient's noncompliance with other medical treatment and regimen: Secondary | ICD-10-CM | POA: Diagnosis present

## 2014-07-15 DIAGNOSIS — E870 Hyperosmolality and hypernatremia: Secondary | ICD-10-CM

## 2014-07-15 DIAGNOSIS — I351 Nonrheumatic aortic (valve) insufficiency: Secondary | ICD-10-CM | POA: Diagnosis present

## 2014-07-15 LAB — COMPREHENSIVE METABOLIC PANEL
ALK PHOS: 46 U/L (ref 38–126)
ALT: 20 U/L (ref 17–63)
AST: 32 U/L (ref 15–41)
Albumin: 2.9 g/dL — ABNORMAL LOW (ref 3.5–5.0)
Anion gap: 9 (ref 5–15)
BUN: 36 mg/dL — ABNORMAL HIGH (ref 6–20)
CHLORIDE: 116 mmol/L — AB (ref 101–111)
CO2: 26 mmol/L (ref 22–32)
Calcium: 9.7 mg/dL (ref 8.9–10.3)
Creatinine, Ser: 2.41 mg/dL — ABNORMAL HIGH (ref 0.61–1.24)
GFR calc Af Amer: 30 mL/min — ABNORMAL LOW (ref >60)
GFR calc non Af Amer: 26 mL/min — ABNORMAL LOW (ref >60)
Glucose, Bld: 118 mg/dL — ABNORMAL HIGH (ref 65–99)
Potassium: 3.6 mmol/L (ref 3.5–5.1)
Sodium: 151 mmol/L — ABNORMAL HIGH (ref 135–145)
Total Bilirubin: 0.8 mg/dL (ref 0.3–1.2)
Total Protein: 7.4 g/dL (ref 6.5–8.1)

## 2014-07-15 LAB — CBC WITH DIFFERENTIAL/PLATELET
BASOS PCT: 0 % (ref 0–1)
Basophils Absolute: 0 10*3/uL (ref 0.0–0.1)
EOS ABS: 0 10*3/uL (ref 0.0–0.7)
Eosinophils Relative: 0 % (ref 0–5)
HCT: 29.7 % — ABNORMAL LOW (ref 39.0–52.0)
Hemoglobin: 8.9 g/dL — ABNORMAL LOW (ref 13.0–17.0)
Lymphocytes Relative: 11 % — ABNORMAL LOW (ref 12–46)
Lymphs Abs: 1 10*3/uL (ref 0.7–4.0)
MCH: 27.2 pg (ref 26.0–34.0)
MCHC: 30 g/dL (ref 30.0–36.0)
MCV: 90.8 fL (ref 78.0–100.0)
MONOS PCT: 14 % — AB (ref 3–12)
Monocytes Absolute: 1.3 10*3/uL — ABNORMAL HIGH (ref 0.1–1.0)
NEUTROS ABS: 7.2 10*3/uL (ref 1.7–7.7)
Neutrophils Relative %: 75 % (ref 43–77)
Platelets: 346 10*3/uL (ref 150–400)
RBC: 3.27 MIL/uL — ABNORMAL LOW (ref 4.22–5.81)
RDW: 19.2 % — ABNORMAL HIGH (ref 11.5–15.5)
WBC: 9.5 10*3/uL (ref 4.0–10.5)

## 2014-07-15 LAB — CARBOXYHEMOGLOBIN
Carboxyhemoglobin: 1.1 % (ref 0.5–1.5)
Methemoglobin: 0.8 % (ref 0.0–1.5)
O2 Saturation: 64.8 %
Total hemoglobin: 9.5 g/dL — ABNORMAL LOW (ref 13.5–18.0)

## 2014-07-15 LAB — CULTURE, BLOOD (ROUTINE X 2)
CULTURE: NO GROWTH
CULTURE: NO GROWTH

## 2014-07-15 LAB — MAGNESIUM: Magnesium: 2 mg/dL (ref 1.7–2.4)

## 2014-07-15 LAB — HEPARIN LEVEL (UNFRACTIONATED)
HEPARIN UNFRACTIONATED: 0.59 [IU]/mL (ref 0.30–0.70)
Heparin Unfractionated: 0.81 IU/mL — ABNORMAL HIGH (ref 0.30–0.70)
Heparin Unfractionated: 1.66 IU/mL — ABNORMAL HIGH (ref 0.30–0.70)
Heparin Unfractionated: 0.7 IU/mL (ref 0.30–0.70)

## 2014-07-15 MED ORDER — ATROPINE SULFATE 0.1 MG/ML IJ SOLN
INTRAMUSCULAR | Status: AC
  Filled 2014-07-15: qty 10

## 2014-07-15 MED ORDER — LORAZEPAM 2 MG/ML IJ SOLN: 0.5000 mg | Freq: Once | INTRAMUSCULAR | Status: DC

## 2014-07-15 MED ORDER — METOPROLOL TARTRATE 1 MG/ML IV SOLN: 12.5000 mg | Freq: Two times a day (BID) | INTRAVENOUS | Status: DC

## 2014-07-15 MED ORDER — DEXTROSE 5 % IV SOLN
INTRAVENOUS | Status: DC
  Administered 2014-07-15: 50 mL via INTRAVENOUS
  Administered 2014-07-16: 18:00:00 via INTRAVENOUS
  Administered 2014-07-17: 60 mL via INTRAVENOUS

## 2014-07-15 MED ORDER — METOPROLOL TARTRATE 1 MG/ML IV SOLN
7.5000 mg | Freq: Three times a day (TID) | INTRAVENOUS | Status: DC
  Administered 2014-07-15 – 2014-07-17 (×6): 7.5 mg via INTRAVENOUS
  Filled 2014-07-15 (×7): qty 10

## 2014-07-15 NOTE — Progress Notes (Signed)
Patient pulled CVP line apart. Patient cleaned up, repositioned and mitts placed on hands. Respiratory aware and will replace CVP line. Following closely.

## 2014-07-15 NOTE — Progress Notes (Signed)
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)  No Known Allergies  Patient Measurements: Height: 5\' 8"  (172.7 cm) Weight: 233 lb 4 oz (105.8 kg) IBW/kg (Calculated) : 68.4 Heparin dosing weight = 91.6 kg  Vital Signs: Temp: 97.3 F (36.3 C) (06/19 1100) Temp Source: Axillary (06/19 1100) BP: 128/81 mmHg (06/19 1350) Pulse Rate: 69 (06/19 1200)  Labs:  Recent Labs  07/13/14 0340 07/14/14 0330 07/15/14 0427 07/15/14 0428 07/15/14 1115 07/15/14 1400  HGB 9.3* 8.9*  --  8.9*  --   --   HCT 29.8* 29.4*  --  29.7*  --   --   PLT 345 348  --  346  --   --   HEPARINUNFRC 0.60 0.49 0.81*  --  1.66* 0.59  CREATININE 1.87* 2.08*  --  2.41*  --   --     Estimated Creatinine Clearance: 35.1 mL/min (by C-G formula based on Cr of 2.41).   Assessment: 69 yo male with Afib/AVR, s/p tooth extraction last week.   Heparin held for most of 6/14 d/t ongoing oral bleeding - this has resolved.  Original HL on 6/19 back at 1.66 - spoke with RN who drew it from where the heparin was being infused. Reordered HL-came back slightly supratherapeutic (0.59) on 1100 units/hr. Goal 0.3-0.5.  Goal of Therapy:  Heparin level 0.3-0.5 Monitor platelets by anticoagulation protocol: Yes   Plan:  Decrease heparin to 1050 units/hr F/u 6 hr heparin level Daily HL and CBC Monitor for s/sx of bleeding F/u long-term anticoag plan   Sylvia Kondracki E. Liala Codispoti, Pharm.D Clinical Pharmacy Resident Pager: [phone] 07/15/2014 3:11 PM

## 2014-07-15 NOTE — Progress Notes (Signed)
Patient ID: JEVAN GAUNT, male   DOB: 18-Jan-1946, 69 y.o.   MRN: [id number]    Patient Name: [name] Date of Encounter: 07/15/2014     Principal Problem:   Positive blood cultures Active Problems:   Aortic valve insufficiency   Hx of aortic aneurysm repair   Atrial fibrillation, do not know length of time   Gastric erosion   Acute respiratory failure, unspecified whether with hypoxia or hypercapnia   Anemia   Ventricular tachycardia   Essential hypertension   H/O aortic valve replacement   Acute renal insufficiency   AI (aortic insufficiency)   Urinary tract infection due to Enterococcus   Enterococcal bacteremia   Cardiac arrest   Aortic valve incompetence   H/O noncompliance with medical treatment, presenting hazards to health   DVT of upper extremity (deep vein thrombosis)   NSVT (nonsustained ventricular tachycardia)   Chronic atrial fibrillation   Acute on chronic renal failure   Hematemesis with nausea   Aortic valve vegetation   Dysphagia    SUBJECTIVE  "I feel better today", no chest pain. Abdominal pain is improved.  CURRENT MEDS . ampicillin (OMNIPEN) IV  2 g Intravenous 6 times per day  . antiseptic oral rinse  7 mL Mouth Rinse q12n4p  . chlorhexidine  15 mL Mouth/Throat BID  . hydrALAZINE  7 mg Intravenous Q6H  . metoprolol  12.5 mg Intravenous Q4H  . pantoprazole (PROTONIX) IV  40 mg Intravenous Q12H    OBJECTIVE  Filed Vitals:   07/15/14 0250 07/15/14 0310 07/15/14 0400 07/15/14 0722  BP: 165/98 142/83 128/95 145/98  Pulse:  79 83 82  Temp:  97.5 F (36.4 C)  97.3 F (36.3 C)  TempSrc:  Axillary  Axillary  Resp:  18  18  Height:      Weight:   233 lb 4 oz (105.8 kg)   SpO2:  97% 98% 99%    Intake/Output Summary (Last 24 hours) at 07/15/14 0801 Last data filed at 07/15/14 0508  Gross per 24 hour  Intake    668 ml  Output    850 ml  Net   -182 ml   Filed Weights   07/13/14 0800 07/14/14 0800 07/15/14 0400  Weight: 230 lb  13.2 oz (104.7 kg) 232 lb 5.8 oz (105.4 kg) 233 lb 4 oz (105.8 kg)    PHYSICAL EXAM  General: Pleasant, NAD. Neuro: Alert and oriented X 3. Moves all extremities spontaneously. Psych: Normal affect. HEENT:  Normal  Neck: Supple without bruits or JVD. Lungs:  Resp regular and unlabored, CTA. Heart: RRR no s3, s4, or murmurs. Abdomen: Soft, non-tender, non-distended, BS + x 4.  Extremities: No clubbing, cyanosis or edema. DP/PT/Radials 2+ and equal bilaterally.  Accessory Clinical Findings  CBC  Recent Labs  07/14/14 0330 07/15/14 0428  WBC 10.3 PENDING  NEUTROABS 7.4 PENDING  HGB 8.9* 8.9*  HCT 29.4* 29.7*  MCV 88.3 90.8  PLT 348 338   Basic Metabolic Panel  Recent Labs  07/14/14 0330 07/15/14 0428  NA 148* 151*  K 3.5 3.6  CL 113* 116*  CO2 25 26  GLUCOSE 120* 118*  BUN 34* 36*  CREATININE 2.08* 2.41*  CALCIUM 9.3 9.7  MG 2.1 2.0   Liver Function Tests  Recent Labs  07/14/14 0330 07/15/14 0428  AST 26 32  ALT 16* 20  ALKPHOS 50 46  BILITOT 0.6 0.8  PROT 7.0 7.4  ALBUMIN 2.8* 2.9*   No results for input(s):  LIPASE, AMYLASE in the last 72 hours. Cardiac Enzymes No results for input(s): CKTOTAL, CKMB, CKMBINDEX, TROPONINI in the last 72 hours. BNP Invalid input(s): POCBNP D-Dimer No results for input(s): DDIMER in the last 72 hours. Hemoglobin A1C No results for input(s): HGBA1C in the last 72 hours. Fasting Lipid Panel No results for input(s): CHOL, HDL, LDLCALC, TRIG, CHOLHDL, LDLDIRECT in the last 72 hours. Thyroid Function Tests No results for input(s): TSH, T4TOTAL, T3FREE, THYROIDAB in the last 72 hours.  Invalid input(s): FREET3  TELE  Atrial fib with a controlled VR.  Radiology/Studies  Dg Orthopantogram  06/19/2014   CLINICAL DATA:  Aortic stenosis, preop  EXAM: ORTHOPANTOGRAM/PANORAMIC  COMPARISON:  None available  FINDINGS: Multiple missing teeth and restorations. Caries involving teeth 3 and 4. Periapical lucency/erosion  involving tooth number 13.  IMPRESSION: 1. Dental caries and periapical disease as above.   Electronically Signed   By: Lucrezia Europe [name]   On: 06/19/2014 16:52   Dg Knee 1-2 Views Left  07/03/2014   CLINICAL DATA:  69 year old male with generalized left knee pain and swelling progressive the past few days.  EXAM: LEFT KNEE - 1-2 VIEW  COMPARISON:  None.  FINDINGS: Suspect small suprapatellar joint effusion. No evidence acute fracture or malalignment. There is narrowing of the lateral compartmental joint space with subchondral sclerosis and subtle irregularity of the cortical surface of the lateral femoral condyle and lateral tibial plateau. Mild osteophyte formation in the patellofemoral compartment. No evidence of acute fracture or malalignment. No focal soft tissue abnormality.  IMPRESSION: 1. Small suprapatellar knee joint effusion which may be degenerative, or infectious. 2. Degenerative changes relatively focal to the lateral compartments with significant narrowing of the joint space and subtle irregularity of the cortical margin of the lateral femoral condyle and lateral tibial plateau. This is favored to represent advanced degenerative change with chondromalacia and secondary bony changes.   Electronically Signed   By: Jacqulynn Cadet [name]   On: 07/03/2014 18:43   Dg Abd 1 View  07/11/2014   CLINICAL DATA:  Feeding tube placement  EXAM: ABDOMEN - 1 VIEW  COMPARISON:  July 06, 2014  FINDINGS: Feeding tube tip is in the gastric cardia just beyond the gastroesophageal junction. Bowel gas pattern is normal. There are surgical clips in the upper abdominal region.  IMPRESSION: Feeding tube tip is just beyond the gastroesophageal junction in the gastric cardia region. It may be prudent to advance the feeding tube 8-10 cm to increase security that the tube will remain in the stomach given its current quite proximal gastric location. Normal gas pattern.   Electronically Signed   By: Lowella Grip III [name]    On: 07/11/2014 11:26   Ct Head Wo Contrast  06/19/2014   CLINICAL DATA:  Patient with cognitive deficits and expressive aphasia.  EXAM: CT HEAD WITHOUT CONTRAST  TECHNIQUE: Contiguous axial images were obtained from the base of the skull through the vertex without intravenous contrast.  COMPARISON:  None.  FINDINGS: Ventricles and sulci are appropriate for patient's age. No evidence for acute cortically based infarct, intracranial hemorrhage, mass lesion or mass-effect. Orbits are unremarkable. Polypoid mucosal thickening bilateral maxillary sinuses. Frontal sinus and ethmoid air cells are unremarkable. Mastoid air cells are well aerated. Calvarium is intact. Zygomatic arches are unremarkable.  IMPRESSION: No acute intracranial process.  Polypoid mucosal thickening paranasal sinuses.   Electronically Signed   By: Lovey Newcomer [name]   On: 06/19/2014 15:30   Dg Chest [facility]  07/14/2014   CLINICAL DATA:  69 year old male for evaluation of feeding tube placement. Initial encounter.  EXAM: PORTABLE CHEST - 1 VIEW  COMPARISON:  Abdominal radiograph 2314 hr today. Chest CTA 06/13/2014.  FINDINGS: The feeding tube tip projects just above the left hemidiaphragm along the left cardiophrenic angle (arrow). Abnormal mediastinal contour re- demonstrated and on 06/13/2014 a dissected an enlarged thoracic aorta was demonstrated. Lower lung volumes. Increased veiling opacity at the right lung base. No superimposed pneumothorax. Increased pulmonary vascularity without overt edema. Right side PICC line appears stable. Superimposed pacing or resuscitation pads and EKG leads.  IMPRESSION: 1. Feeding tube tip projects at the level of the distal thoracic esophagus just above the diaphragm. 2. Abnormal cardiac and mediastinal contours related to cardiomegaly and thoracic aortic dissection and aneurysmal enlargement as seen on 06/13/2014. 3. Increased right pleural effusion.   Electronically Signed   By: Genevie Ann [name]   On:  07/14/2014 00:10   Dg Chest Port 1 View  07/11/2014   CLINICAL DATA:  Shortness of Breath  EXAM: PORTABLE CHEST - 1 VIEW  COMPARISON:  July 08, 2014  FINDINGS: Nasogastric tube and endotracheal tube have been removed. Central catheter tip is in the superior vena cava. No pneumothorax.  There is no edema or consolidation currently. There is a small right effusion. There is persistent cardiomegaly. The pulmonary vascularity is within normal limits. No adenopathy.  IMPRESSION: No pneumothorax. Cardiomegaly with small right effusion. There may be a mild degree of residual congestive heart failure. There is no edema or consolidation, however.   Electronically Signed   By: Lowella Grip III [name]   On: 07/11/2014 07:22   Dg Chest Port 1 View  07/08/2014   CLINICAL DATA:  69 year old male with respiratory failure  EXAM: PORTABLE CHEST - 1 VIEW  COMPARISON:  Prior chest x-ray 07/07/2014  FINDINGS: The endotracheal tube tip is 4 cm above the carina. A right upper extremity PICC is present. The catheter tip projects over the superior cavoatrial junction. A nasogastric tube is present. The tip of the tube lies below the diaphragm off the field of view but likely within the stomach. Stable cardiomegaly and aneurysmal dilatation of the aorta. No pneumothorax. Small right layering pleural effusion again noted. Overall, improved inspiratory volumes with decreased pulmonary vascular congestion and decreasing bibasilar atelectasis.  IMPRESSION: 1. Improved inspiratory volumes with decreasing pulmonary vascular congestion and improving bibasilar atelectasis. 2. Stable and satisfactory support apparatus. 3. Stable cardiomegaly and descending thoracic aortic aneurysm.   Electronically Signed   By: Jacqulynn Cadet [name]   On: 07/08/2014 08:36   Dg Chest Port 1 View  07/07/2014   CLINICAL DATA:  Respiratory failure  EXAM: PORTABLE CHEST - 1 VIEW  COMPARISON:  Six hundred ten  FINDINGS: Endotracheal tube is 4.2 cm above the  carina. The nasogastric tube extends into the stomach. There is a satisfactorily positioned right upper extremity PICC line with tip in the SVC. There is no pneumothorax. There is improvement, with partial clearance of basilar opacities and with better definition of diaphragmatic contours. Moderate opacity persists in the left base.  IMPRESSION: Support equipment appears satisfactorily positioned.  Improved, with partial clearance of basilar opacities.   Electronically Signed   By: Andreas Newport [name]   On: 07/07/2014 06:37   Dg Chest Port 1 View  07/06/2014   CLINICAL DATA:  Respiratory failure.  EXAM: PORTABLE CHEST - 1 VIEW  COMPARISON:  06/24/2014 .  FINDINGS: Endotracheal tube noted with its  tip 1.1 cm above the carina. Right PICC line noted with tip at the cavoatrial junction. Right IJ line in stable position with tip projected over the cavoatrial junction. Cardiomegaly normal pulmonary vascularity. Bibasilar atelectasis and/or infiltrates with small bilateral pleural effusions. No pneumothorax .  IMPRESSION: 1. Interim placement of endotracheal tube, its tip is 1.1 cm above the carina. Proximal repositioning of approximately 2 cm should be considered. 2. Right IJ line and right PICC line in good anatomic position. 3. Stable cardiomegaly. 4. Bibasilar atelectasis and/or infiltrates with possible small bilateral pleural effusions. Critical Value/emergent results were called by telephone at the time of interpretation on 07/06/2014 at 3:59 pm to nurse Amy , who verbally acknowledged these results.   Electronically Signed   By: [place]   On: 07/06/2014 16:06   Dg Chest Port 1 View  06/24/2014   CLINICAL DATA:  Shortness of breath.  Subsequent exam.  EXAM: PORTABLE CHEST - 1 VIEW  COMPARISON:  06/21/2014  FINDINGS: Since the previous exam, the endotracheal tube has been removed. Right internal jugular central venous line is stable and well positioned.  Stable cardiomegaly. Minor basilar lung  opacity consistent with atelectasis. No convincing pneumonia and no pulmonary edema. No pneumothorax.  Stable elevation the right hemidiaphragm.  IMPRESSION: 1. No acute cardiopulmonary disease. 2. Stable cardiomegaly. Minor lung base atelectasis. Right IJ central venous line is stable in well positioned.   Electronically Signed   By: Lajean Manes [name]   On: 06/24/2014 08:49   Dg Chest Portable 1 View  06/21/2014   CLINICAL DATA:  Status post EGD for upper GI bleed, evaluate ETT  EXAM: PORTABLE CHEST - 1 VIEW  COMPARISON:  06/16/2014  FINDINGS: Endotracheal tube terminates 4 cm above the carina.  Mild blunting of the left costophrenic angle. Mild left basilar atelectasis. Lungs otherwise clear. No pleural effusion or pneumothorax.  Cardiomegaly.  Right IJ venous catheter terminates at the cavoatrial junction.  IMPRESSION: Endotracheal tube terminates 4 cm above the carina.  Right IJ venous catheter terminates at the cavoatrial junction.  No pneumothorax.   Electronically Signed   By: Julian Hy [name]   On: 06/21/2014 16:25   Dg Chest Port 1 View  06/16/2014   CLINICAL DATA:  Central line placement.  EXAM: PORTABLE CHEST - 1 VIEW  COMPARISON:  06/14/2014  FINDINGS: New right jugular central line is been placed with the tip in the upper right atrium. No pneumothorax. Stable volume loss of the right lung with slightly more prominent mid lung atelectasis. Mild interstitial edema suspected. No pleural effusions. Stable cardiac enlargement.  IMPRESSION: Central line tip in upper right atrium. No pneumothorax. Slightly more prominent right mid lung atelectasis. Mild interstitial edema suspected.   Electronically Signed   By: Aletta Edouard [name]   On: 06/16/2014 13:58   Dg Abd Portable 1v  07/13/2014   ADDENDUM REPORT: 07/13/2014 23:38  ADDENDUM: CORRECTED IMPRESSION:  Normal NON obstructive bowel gas pattern. Interval removal of enteric  tube.   Electronically Signed   By: Lucienne Capers [name]   On:  07/13/2014 23:38   07/13/2014   CLINICAL DATA:  Abdominal pain tonight.  EXAM: PORTABLE ABDOMEN - 1 VIEW  COMPARISON:  07/12/2014  FINDINGS: The enteric tube appears to been removed since the previous study. Visualized bowel gas pattern is normal. Stool in the colon. No small or large bowel distention. No radiopaque stones. Surgical clips in the abdomen and left pelvis. Vascular calcifications.  IMPRESSION: Normal on obstructive bowel  gas pattern. Interval removal of enteric tube.  Electronically Signed: By: Lucienne Capers [name] On: 07/13/2014 23:23   Dg Abd Portable 1v  07/12/2014   CLINICAL DATA:  NG tube placement.  EXAM: PORTABLE ABDOMEN - 1 VIEW  COMPARISON:  07/02/2014 at 13:45 p.m.  FINDINGS: Examination again demonstrates patient's enteric tube which has been advanced and has tip over the left upper quadrant likely within the mid body of the stomach. Remainder of the exam is unchanged.  IMPRESSION: Enteric tube with tip over the left upper quadrant likely within the mid body of the stomach.   Electronically Signed   By: Marin Olp [name]   On: 07/12/2014 14:15   Dg Abd Portable 1v  07/12/2014   CLINICAL DATA:  NG placement  EXAM: PORTABLE ABDOMEN - 1 VIEW  COMPARISON:  07/11/2014  FINDINGS: Feeding tube is in the body the stomach similar to yesterday. Normal bowel gas pattern. No dilated bowel loops.  IMPRESSION: Feeding tube tip in the body the stomach.   Electronically Signed   By: Franchot Gallo [name]   On: 07/12/2014 14:11   Dg Abd Portable 1v  07/12/2014   CLINICAL DATA:  NG tube placement.  EXAM: PORTABLE ABDOMEN - 1 VIEW  COMPARISON:  07/11/2014  FINDINGS: Feeding tube tip is demonstrated in the left upper quadrant just distal to the esophagogastric junction. No significant change in position since previous study. Surgical clips in the upper abdomen. Visualized gas-filled bowel loops are not distended.  IMPRESSION: No change in position of feeding tube with tip just distal to the expected  location of the esophagogastric junction.   Electronically Signed   By: Lucienne Capers [name]   On: 07/12/2014 02:03   Dg Abd Portable 1v  07/06/2014   CLINICAL DATA:  Nasogastric catheter placement  EXAM: PORTABLE ABDOMEN - 1 VIEW  COMPARISON:  None.  FINDINGS: Scattered large and small bowel gas is noted. A nasogastric catheter is noted within the distal aspect of the stomach in satisfactory position.  IMPRESSION: Nasogastric catheter in satisfactory position.   Electronically Signed   By: Inez Catalina [name]   On: 07/06/2014 19:43   Dg Swallowing Func-speech Pathology  07/14/2014    Objective Swallowing Evaluation:    Patient Details  Name: HEZZIE KARIM MRN: [id number] Date of Birth: 1945-07-27  Today's Date: 07/14/2014 Time: SLP Start Time (ACUTE ONLY): 10:31-SLP Stop Time (ACUTE ONLY): 10:50 SLP Time Calculation (min) (ACUTE ONLY): 19 min  Past Medical History:  Past Medical History  Diagnosis Date  . Chest pain   . Hypertension   . Aortic aneurysm 1993  . 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 Pneumonia   . Aortic valve replaced      05/2014 transfered from [place] to [facility] for HF in the setting  of failed valve repair. Due to thrombosis of valve after pt stopped  coumadin. CT surgery considering AVR, delayed due to GI bleed  . Hx of aortic aneurysm repair 06/14/2014  . Atrial fibrillation 05/2014  . Gastritis and gastroduodenitis with hemorrhage 05/2014    severe.   . Tubular adenoma of colon 12/2012  . DVT of upper extremity (deep vein thrombosis) 05/2014    left  . Acute blood loss anemia 05/2014   . CAD (coronary artery disease)     minimal CAD on cath 05/2014   Past Surgical History:  Past Surgical History  Procedure Laterality Date  . Abdominal aortic aneurysm repair  1993  At [facility], Unspecified  . Splenectomy    . Cardiac valve replacement      At [facility]  . Esophagogastroduodenoscopy  Aug 2014    [facility]: large duodenal ulcer with visible vessel, s/p clip and epi,   erosive gastritis. +h.pylori serology, treated with amoxicillin and  biaxin.   . Colonoscopy N/A 12/27/2012    Procedure: COLONOSCOPY;  Surgeon: Danie Binder, MD;  Location[facility];  Service: Endoscopy;  Laterality: N/A;  8:45-moved to 855   . Tee without cardioversion N/A 06/15/2014    Procedure: TRANSESOPHAGEAL ECHOCARDIOGRAM (TEE);  Surgeon: Sueanne Margarita, MD;  Location: [facility] ENDOSCOPY;  Service: Cardiovascular;   Laterality: N/A;  . Cardiac catheterization N/A 06/15/2014    Procedure: Fluoroscopy Guidance;  Surgeon: Josue Hector, MD;   Location: [place] CV LAB;  Service: Cardiovascular;  Laterality: N/A;  . Esophagogastroduodenoscopy N/A 06/18/2014    Procedure: ESOPHAGOGASTRODUODENOSCOPY (EGD);  Surgeon: Jerene Bears, MD;   Location: [facility] ENDOSCOPY;  Service: Endoscopy;  Laterality: N/A;  . Peripheral vascular catheterization  06/19/2014    Procedure: Aortic Arch Angiography;  Surgeon: Jettie Booze, MD;   Location: [place] CV LAB;  Service: Cardiovascular;;  . Esophagogastroduodenoscopy N/A 06/21/2014    Procedure: ESOPHAGOGASTRODUODENOSCOPY (EGD);  Surgeon: Jerene Bears, MD;   Location: [facility] ENDOSCOPY;  Service: Endoscopy;  Laterality: N/A;  . Esophagogastroduodenoscopy N/A 06/21/2014    Procedure: ESOPHAGOGASTRODUODENOSCOPY (EGD);  Surgeon: Jerene Bears, MD;   Location: [place];  Service: Gastroenterology;  Laterality: N/A;  . Multiple extractions with alveoloplasty N/A 07/02/2014    Procedure: Extraction of tooth #'s 3,4,5,13,14,21,23,24,25,26, with  alveoloplasty and gross debridement of remaining teeth.;  Surgeon: [name], DDS;  Location: [place];  Service: Oral Surgery;  Laterality:  N/A;   HPI:  Other Pertinent Information: 69 y.o. male with PMH significant for A fib,  History of aortic aneurysm repair, Aortic valve insufficiency, [employer]  mechanical Valve with malfunction, last hospitalization 2 weeks ago for  cardiogenic shock from malfunctioning valve, GI bleed from gastric  cardiac  erosion, he was discharge to [facility] for Rehab. Plan to have AVR redo by Dr  Nils Pyle at the end of June. He underwent Multiple tooth extractions on  07/09/2014. Post op complicated by VT arrest requiring ~ 2 min of CPR.  Intubated 07/06/14. Extubated 07/08/14.Continues to have copious oral  bleeding, pt is on heparin.   No Data Recorded  Assessment / Plan / Recommendation CHL IP CLINICAL IMPRESSIONS 07/14/2014  Therapy Diagnosis Moderate oral phase dysphagia;Moderate pharyngeal phase  dysphagia  Clinical Impression Pt with congested breathing and stridor upon exhale  prior to study with wet vocal quality concerning for poor secretion  managment. Pts swallow function initially appearing improved compared to  FEES report on 07-10-14. However as study progressed pt exhibited poor  protection of airway with residuals in valleculae filling up and spilling  into the laryngeal vestibule to become silently aspirated with ineffective  volitional cough. Cueing for additional swallow ineffective at clearing  residuals with fatigue and weakness noted.  Continue NPO at this time with  therapeutic PO trials with SLP only. Recommend medicine via alternative  means. ST to follow up.       CHL IP TREATMENT RECOMMENDATION 07/14/2014  Treatment Recommendations Therapy as outlined in treatment plan below     CHL IP DIET RECOMMENDATION 07/14/2014  SLP Diet Recommendations NPO  Liquid Administration via (None)  Medication Administration Via alternative  means  Compensations (None)  Postural Changes and/or Swallow Maneuvers (None)     CHL IP OTHER RECOMMENDATIONS 07/14/2014  Recommended Consults (None)  Oral Care Recommendations Oral care QID  Other Recommendations (None)     CHL IP FOLLOW UP RECOMMENDATIONS 07/11/2014  Follow up Recommendations Skilled Nursing facility     [facility] IP FREQUENCY AND DURATION 07/10/2014  Speech Therapy Frequency (ACUTE ONLY) min 2x/week  Treatment Duration 2 weeks     Pertinent Vitals/Pain     SLP Swallow Goals No  flowsheet data found.  No flowsheet data found.    CHL IP REASON FOR REFERRAL 07/10/2014  Reason for Referral Objectively evaluate swallowing function     CHL IP ORAL PHASE 07/14/2014  Lips (None)  Tongue (None)  Mucous membranes (None)  Nutritional status (None)  Other (None)  Oxygen therapy (None)  Oral Phase Impaired  Oral - Pudding Teaspoon (None)  Oral - Pudding Cup (None)  Oral - Honey Teaspoon (None)  Oral - Honey Cup (None)  Oral - Honey Syringe (None)  Oral - Nectar Teaspoon (None)  Oral - Nectar Cup (None)  Oral - Nectar Straw (None)  Oral - Nectar Syringe (None)  Oral - Ice Chips (None)  Oral - Thin Teaspoon (None)  Oral - Thin Cup (None)  Oral - Thin Straw (None)  Oral - Thin Syringe (None)  Oral - Puree (None)  Oral - Mechanical Soft (None)  Oral - Regular (None)  Oral - Multi-consistency (None)  Oral - Pill (None)  Oral Phase - Comment (None)      CHL IP PHARYNGEAL PHASE 07/14/2014  Pharyngeal Phase Impaired  Pharyngeal - Pudding Teaspoon (None)  Penetration/Aspiration details (pudding teaspoon) (None)  Pharyngeal - Pudding Cup (None)  Penetration/Aspiration details (pudding cup) (None)  Pharyngeal - Honey Teaspoon (None)  Penetration/Aspiration details (honey teaspoon) (None)  Pharyngeal - Honey Cup (None)  Penetration/Aspiration details (honey cup) (None)  Pharyngeal - Honey Syringe (None)  Penetration/Aspiration details (honey syringe) (None)  Pharyngeal - Nectar Teaspoon (None)  Penetration/Aspiration details (nectar teaspoon) (None)  Pharyngeal - Nectar Cup (None)  Penetration/Aspiration details (nectar cup) (None)  Pharyngeal - Nectar Straw (None)  Penetration/Aspiration details (nectar straw) (None)  Pharyngeal - Nectar Syringe (None)  Penetration/Aspiration details (nectar syringe) (None)  Pharyngeal - Ice Chips (None)  Penetration/Aspiration details (ice chips) (None)  Pharyngeal - Thin Teaspoon (None)  Penetration/Aspiration details (thin teaspoon) (None)  Pharyngeal - Thin Cup (None)   Penetration/Aspiration details (thin cup) (None)  Pharyngeal - Thin Straw (None)  Penetration/Aspiration details (thin straw) (None)  Pharyngeal - Thin Syringe (None)  Penetration/Aspiration details (thin syringe') (None)  Pharyngeal - Puree (None)  Penetration/Aspiration details (puree) (None)  Pharyngeal - Mechanical Soft (None)  Penetration/Aspiration details (mechanical soft) (None)  Pharyngeal - Regular (None)  Penetration/Aspiration details (regular) (None)  Pharyngeal - Multi-consistency (None)  Penetration/Aspiration details (multi-consistency) (None)  Pharyngeal - Pill (None)  Penetration/Aspiration details (pill) (None)  Pharyngeal Comment (None)      No flowsheet data found.  No flowsheet data found.        Arvil Chaco MA, CCC-SLP Acute Care Speech Language Pathologist    Levi Aland 07/14/2014, 11:14 AM     ASSESSMENT AND PLAN  1. Aortic valve thrombosis with severe AS - awaiting high risk redo surgery. Agree with treating enterococcal bacteremia. He is currently (and may always be) a poor surgical candidate. 2. Enterococcal bacteremia -  On antibiotics 3. Difficulty swallowing - note results of swallowing study 4. Chronic atrial fib  with a controlled VR 5. Abdominal pain, etiology unclear - GI to see regarding PEG as well 6. Malnutrition - awaiting feeding tube 7. Acute on chronic diastolic heart failure - well compensated currently  Gabe Glace,[name]  07/15/2014 8:01 AM

## 2014-07-15 NOTE — Progress Notes (Signed)
Patient's CVP 24-25. Dr. Sherral Hammers made aware. Will continue to monitor at this time.

## 2014-07-15 NOTE — Progress Notes (Signed)
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 [name].   Presented to [facility] hospital on 06/11/2014 complaining of SOB. 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 Also noted to be in a-fib. Evaluated by CT surgery for consideration of high risk AVR. Cath showed mild CAD however cannot visually distal RCA system. Surgery delayed due to recurrent GI bleed treated with EGDx2 and clipping. I discharged the patient to CIR on 6/3 to max out physical therapy before high risk AVR. Underwent dental extraction on 6/10, post op, had VT arrest, s/p CPR and intubation. ID consulted after blood cx came back with enteroccocal infection (likely contamination from central line)  HPI/Subjective: 6/19 A/O x 4 States negative DOE, negative sore mouth.   Assessment/Plan: Multisystem organ failure -Secondary to below, have placed palliative consult  VT arrest/cardiac arrest -Resolved  Thrombosed aortic valve/incompetence St. Jude aortic valve/Severe Aortic Insufficency -Although there are notes state patient was transferred from [facility] to [place] secondary to thrombosed aortic valve, unable to substantiate this statement. Review of findings from 5/24 cardiac catheterization does not mention thrombosed aortic valve. -AVR dysfunction secondary to one of the leaflets being immobile see TEE results 5/20 below -CT surgery states patient is not currently a candidate for placement until  completed 2 weeks antibiotics and a course of physical therapy at CIR -6/19 CVP= 25 : Elevated CVP expected with patient's AV insufficiency, unfortunately cannot  diuresis secondary to patient being preload dependent -Not accepted to CIR;-Bid out to SNF  Noncompliant with Coumadin therapy for 3+ months -See thrombosed aortic valve  LUE DVT -Heparin per pharmacy  NSVT/HTN -Decrease Metoprolol IV 7.5 mg TID 2dary to Pt HR dropping to 30s -Hydralazine IV 7 mg QID -Hydralazine IV PRN SBP> 160 or DBP> 100  Chronic atrial fibrillation:  - CHA2DS2-Vasc score of 3, continue heparin drip per pharmacy -Rate controlled with IV metoprolol -See HTN   Acute on chronic renal failure -Cr trending up today Pt appears to be Hypovolemic, however patient's urine output decreasing most likely secondary to poor perfusion secondary to severe AV insufficiency. -Will try to maintain patient euvolemic; I/O since Admission +3.8 -Start D5W at 48ml/hr(will also help w/ Hypernatrermia)  Severe pharyngeal phase dysphagia - nutrition is key in preparing patient for eventual life-saving valve surgery, continue NG tube feeds  -Failed swallow study 6/15 Speech reevaluated patient on 6/18; failed MBS  -Requested IR place PEG tube however they refused secondary to patient having GI bleeding last month with epinephrine injection and 4 times clips placed. SNF will not except patient with NG tube. -Contacted [facility] and discussed case and need for PEG tube. Dr. Ardis Hughs will evaluate for PEG tube placement. -Dr. Ardis Hughs to discuss with partners if feasible to place PEG  Poor dentation  - DDS [name] (dental) does not believe patient would tolerate removal of remaining teeth.  -Counseled patient and daughter that this may need to be addressed at later date after valve replaced  Enterococcal bacteremia -Possible contaminant since it was isolated from picc line.  -CT surgery requests patient complete 2 weeks of antibiotics  -Complete 2 weeks of  antibiotics with ampicillin  Recent GI bleed. -Hemoglobin stable  H/o Aortic dissection -BP rising have restarted some  of patient's BP medication -Initially Maintain SBP<145, will slowly titrate medication and obtain lowest SBP patient will tolerate as his physical condition improves  Hematuria:  -hgb stable  HyperNatremia -D5W at 50 ml/hr   Code Status: FULL Family Communication: No family present at time of exam Disposition Plan: Resolution of bacteremia: SNF    Consultants: Dr.[name] (PCCM) Dr.[name] (cardiology) Dr.[name] (infectious disease) DDS [name] (dental) Dr.[name] (cardiothoracic surgery)   Procedure/Significant Events: 5/20 TEE'; - LVEF=55% to 60%.- Aortic valve: St. Jude Mechanical AVR is present. Only one of the leaflets visualized as moving; significantly reduced; other leaflet is immobile.  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 - Mitral valve: moderate regurgitation. - Left atrium: severely dilated. No thrombus in the atrial cavity or appendage. - Right atrium: moderately dilated. Nothrombus in the atrial cavity or appendage. -Tricuspid valve: moderate regurgitation. 5/23 EGD by Dr. Zenovia Jarred;  - Two gastric cardia erosions without active bleeding, presumed cause of recent melena- Very mild bulbar duodenitis  5/26 repeat EGD by Dr. Zenovia Jarred; - Large blood clot and fresh blood removed from the proximal stomach - Gastric cardia erosion with visible vessel treated with epinephrine injection and hemostatic clip -4   5/24 upper extremity Doppler;acute deep vein thrombosis involving axillary and brachial veins LUE -Superficial thrombosis in Basilic vein.    Culture 6/9 MRSA by PCR negative 6/10 blood central line 2 positive enterococcus 6/13 blood left/right foot NGTD   Antibiotics: Ampicillin 6/12 (6/13 is day 1/14)>> Ceftriaxone 6/12>> stopped 6/15  DVT prophylaxis: Heparin gtt   Devices    LINES / TUBES:      Continuous  Infusions: . sodium chloride Stopped (07/15/14 1217)  . dextrose 50 mL (07/15/14 1430)  . feeding supplement (OSMOLITE 1.2 CAL) Stopped (07/14/14 0100)  . heparin 1,050 Units/hr (07/15/14 1510)    Objective: VITAL SIGNS: Temp: 97.7 F (36.5 C) (06/19 1600) Temp Source: Axillary (06/19 1600) BP: 148/77 mmHg (06/19 1600) Pulse Rate: 65 (06/19 1600) SPO2; FIO2:   Intake/Output Summary (Last 24 hours) at 07/15/14 1928 Last data filed at 07/15/14 1800  Gross per 24 hour  Intake 816.41 ml  Output    635 ml  Net 181.41 ml     Exam: General: A/O x 4, NAD  No acute respiratory distress Eyes: Negative headache, eye pain, double vision, scotomas, floaters, negative retinal hemorrhage ENT: Negative Runny nose, mild positive gingival bleeding/dried caked on blood,Positive odynophagia Neck:  Negative scars, masses, torticollis, lymphadenopathy, JVD Lungs: Clear to auscultation bilaterally without wheezes or crackles; patient with phlegm and main bronchus which cannot be cleared Cardiovascular:  IRIR rate and rhythm with grade 4/6 systolic murmur (click), normal S1 and S2 Abdomen: negative abdominal pain, negative dysphagia, Nontender, nondistended, soft, bowel sounds positive, no rebound, no ascites, no appreciable mass Extremities: No significant cyanosis, clubbing, or edema bilateral lower extremities Psychiatric:  Negative depression, negative anxiety, negative fatigue, negative mania  Neurologic:  Cranial nerves II through XII intact, tongue/uvula midline, all extremities muscle strength 5/5, sensation intact throughout, positive dysarthria, negative expressive aphasia, negative receptive aphasia.      Data Reviewed: Basic Metabolic Panel:  Recent Labs Lab 07/11/14 0310 07/12/14 0400 07/13/14 0340 07/14/14 0330 07/15/14 0428  NA 143 145 145 148* 151*  K 3.7 3.6 3.5  3.5 3.6  CL 108 108 112* 113* 116*  CO2 23 22 24 25 26   GLUCOSE 101* 122* 171* 120* 118*  BUN 18 22* 29*  34* 36*  CREATININE 1.58* 1.59* 1.87* 2.08* 2.41*  CALCIUM 9.2 9.2 9.3 9.3 9.7  MG 1.7 1.8 1.9 2.1 2.0   Liver Function Tests:  Recent Labs Lab 07/11/14 0310 07/12/14 0400 07/13/14 0340 07/14/14 0330 07/15/14 0428  AST 21 21 25 26  32  ALT 10* 13* 16* 16* 20  ALKPHOS 46 44 45 50 46  BILITOT 1.0 0.8 0.8 0.6 0.8  PROT 6.9 7.0 7.4 7.0 7.4  ALBUMIN 2.6* 2.7* 2.9* 2.8* 2.9*   No results for input(s): LIPASE, AMYLASE in the last 168 hours. No results for input(s): AMMONIA in the last 168 hours. CBC:  Recent Labs Lab 07/11/14 0310 07/12/14 0400 07/13/14 0340 07/14/14 0330 07/15/14 0428  WBC 6.6 7.4 7.9 10.3 9.5  NEUTROABS 4.2 4.8 5.9 7.4 7.2  HGB 8.6* 8.9* 9.3* 8.9* 8.9*  HCT 28.1* 28.5* 29.8* 29.4* 29.7*  MCV 87.5 86.6 89.0 88.3 90.8  PLT 316 310 345 348 346   Cardiac Enzymes: No results for input(s): CKTOTAL, CKMB, CKMBINDEX, TROPONINI in the last 168 hours. BNP (last 3 results)  Recent Labs  06/14/14 2105  BNP 1438.6*    ProBNP (last 3 results) No results for input(s): PROBNP in the last 8760 hours.  CBG:  Recent Labs Lab 07/09/14 0346 07/09/14 0840 07/09/14 0847 07/09/14 1145 07/10/14 1620  GLUCAP 83 68 81 83 102*    Recent Results (from the past 240 hour(s))  Culture, blood (routine x 2)     Status: None   Collection Time: 07/06/14  4:20 PM  Result Value Ref Range Status   Specimen Description BLOOD CENTRAL LINE  Final   Special Requests BOTTLES DRAWN AEROBIC ONLY 10CC  Final   Culture   Final    ENTEROCOCCUS SPECIES Note: SUSCEPTIBILITIES PERFORMED ON PREVIOUS CULTURE WITHIN THE LAST 5 DAYS. Note: Gram Stain Report Called to,Read Back By and Verified With: A AFATSAWO 07/07/14 1055 BY [email or web address] Performed at [facility]    Report Status 07/09/2014 FINAL  Final  Culture, blood (routine x 2)     Status: None   Collection Time: 07/06/14  4:25 PM  Result Value Ref Range Status   Specimen Description BLOOD CENTRAL LINE  Final   Special  Requests BOTTLES DRAWN AEROBIC ONLY 10CC  Final   Culture   Final    ENTEROCOCCUS SPECIES Note: Gram Stain Report Called to,Read Back By and Verified With: A AFATSAWO 07/07/14 1055 BY [email or web address] Performed at [facility]    Report Status 07/09/2014 FINAL  Final   Organism ID, Bacteria ENTEROCOCCUS SPECIES  Final      Susceptibility   Enterococcus species - MIC*    AMPICILLIN <=2 SENSITIVE Sensitive     VANCOMYCIN 2 SENSITIVE Sensitive     * ENTEROCOCCUS SPECIES  Culture, blood (routine x 2)     Status: None   Collection Time: 07/09/14  4:20 PM  Result Value Ref Range Status   Specimen Description BLOOD LEFT FOOT  Final   Special Requests BOTTLES DRAWN AEROBIC ONLY 10CC  Final   Culture   Final    NO GROWTH 5 DAYS Performed at [facility]    Report Status 07/15/2014 FINAL  Final  Culture, blood (routine x 2)     Status: None   Collection Time: 07/09/14  4:30 PM  Result Value  Ref Range Status   Specimen Description BLOOD RIGHT FOOT  Final   Special Requests BOTTLES DRAWN AEROBIC ONLY 3CC  Final   Culture   Final    NO GROWTH 5 DAYS Performed at [facility]    Report Status 07/15/2014 FINAL  Final     Studies:  Recent x-ray studies have been reviewed in detail by the Attending Physician  Scheduled Meds:  Scheduled Meds: . ampicillin (OMNIPEN) IV  2 g Intravenous 6 times per day  . antiseptic oral rinse  7 mL Mouth Rinse q12n4p  . atropine      . chlorhexidine  15 mL Mouth/Throat BID  . hydrALAZINE  7 mg Intravenous Q6H  . metoprolol  7.5 mg Intravenous 3 times per day  . pantoprazole (PROTONIX) IV  40 mg Intravenous Q12H    Time spent on care of this patient: 40 mins   WOODS, Geraldo Docker , MD  [facility] Office  [phone] Pager [phone]  On-Call/Text Page:      [email or web address]      password [email or web address]  If 7PM-7AM, please contact night-coverage [email or web address] Password [email or web address] 07/15/2014, 7:28 PM   LOS: 10 days   Care during the  described time interval was provided by me .  I have reviewed this patient's available data, including medical history, events of note, physical examination, and all test results as part of my evaluation. I have personally reviewed and interpreted all radiology studies.   Dia Crawford, MD [phone] Pager

## 2014-07-15 NOTE — Progress Notes (Signed)
[facility] for Heparin  Indication: DVT (also mechanical AV thrombosis & Afib)  No Known Allergies  Patient Measurements: Height: 5\' 8"  (172.7 cm) Weight: 232 lb 5.8 oz (105.4 kg) IBW/kg (Calculated) : 68.4 Heparin dosing weight = 91.6 kg  Vital Signs: Temp: 97.5 F (36.4 C) (06/19 0310) Temp Source: Axillary (06/19 0310) BP: 142/83 mmHg (06/19 0310) Pulse Rate: 79 (06/19 0310)  Labs:  Recent Labs  07/13/14 0340 07/14/14 0330 07/15/14 0427  HGB 9.3* 8.9*  --   HCT 29.8* 29.4*  --   PLT 345 348  --   HEPARINUNFRC 0.60 0.49 0.81*  CREATININE 1.87* 2.08*  --     Estimated Creatinine Clearance: 40.6 mL/min (by C-G formula based on Cr of 2.08).   Assessment: 69 yo male with Afib/AVR, s/p tooth extraction last week.   Heparin held for most of 6/14 d/t ongoing oral bleeding - this has resolved.  Heparin level supratherapeutic (0.81) on 1300 units/hr.   Goal of Therapy:  Heparin level 0.3-0.5 Monitor platelets by anticoagulation protocol: Yes   Plan:  Decrease heparin to 1100 units/hr F/u 6 hr heparin level F/u long-term anticoag plan  Sherlon Handing, PharmD, BCPS Clinical pharmacist, pager [phone] 07/15/2014 5:03 AM

## 2014-07-15 NOTE — Progress Notes (Signed)
Patient's HR intermittently dropping to the low 30's. Patient is asymptomatic and BP stable. Dr. Sherral Hammers made aware, orders to hold next dose of metoprolol. Will continue to monitor closely.

## 2014-07-15 NOTE — Progress Notes (Signed)
[facility] Progress Note  Subjective:   Pt is a 69 yo male with a complicated PMH including a fib, hx of aortic aneurysm repair, aortic valve insufficiency, St Jude machanical valve with malfunction, acute renal insufficiency, enterococcal bacteremia, cardiac arrest, DVT  Of upper extremity , hospitalized 3 weeks ago for cardiogeneic shock from malfunctioning valve. He was noted to have a GI bleed and had EGD on 5/23 and EGD x 2 on 5/26--felt to have bleeding from gastric erosions in cardia. He was eventually discharged to CIR for rehab with plans to have AVR redo in late June. However, he had multiple tooth extractions on 07/08/2014 and post op dev VT/ arrest requiring CPR. He was intubated 6/10-6/12. As of 6/15 no sign of active bleeding.Pt has reported difficulty swallowing. Had swallowing eval with speech path on 6/14, and was reported to have severe oropharyngeal dysphagia. Swallow initiation with PO was delayed and hypolaryngeal motilty is weak. Oropharyngeal swallow is not functional due to weakness, secretions and decreased awareness and inititation.Pt was eval by IR for PEG tibe placement, however they declined due to pt having GI bleeding last month. Asked to re-eval pt for possible PEG tube placement. Says he feels  better than he has today.   Objective:  Vital signs in last 24 hours: Temp:  [97.3 F (36.3 C)-97.9 F (36.6 C)] 97.3 F (36.3 C) (06/19 0722) Pulse Rate:  [69-90] 69 (06/19 0900) Resp:  [18-20] 18 (06/19 0722) BP: (125-165)/(64-112) 136/112 mmHg (06/19 0818) SpO2:  [92 %-99 %] 94 % (06/19 0900) Weight:  [233 lb 4 oz (105.8 kg)] 233 lb 4 oz (105.8 kg) (06/19 0400) Last BM Date: 07/14/14 General:  Sleepy Heart:  Regular rate and rhythm; no murmurs Pulm;lungs clear Abdomen:  Soft, nontender and nondistended. Normal bowel sounds  Extremities:  Without edema.  Intake/Output from previous day: 06/18 0701 - 06/19 0700 In: 702 [I.V.:402; IV  Piggyback:300] Out: 885 [Urine:885] Intake/Output this shift: Total I/O In: 22 [I.V.:22] Out: 60 [Urine:60]  Lab Results:  Recent Labs  07/13/14 0340 07/14/14 0330 07/15/14 0428  WBC 7.9 10.3 9.5  HGB 9.3* 8.9* 8.9*  HCT 29.8* 29.4* 29.7*  PLT 345 348 346   BMET  Recent Labs  07/13/14 0340 07/14/14 0330 07/15/14 0428  NA 145 148* 151*  K 3.5 3.5 3.6  CL 112* 113* 116*  CO2 24 25 26   GLUCOSE 171* 120* 118*  BUN 29* 34* 36*  CREATININE 1.87* 2.08* 2.41*  CALCIUM 9.3 9.3 9.7   LFT  Recent Labs  07/15/14 0428  PROT 7.4  ALBUMIN 2.9*  AST 32  ALT 20  ALKPHOS 46  BILITOT 0.8    Dg Chest Port 1 View  07/14/2014   CLINICAL DATA:  69 year old male for evaluation of feeding tube placement. Initial encounter.  EXAM: PORTABLE CHEST - 1 VIEW  COMPARISON:  Abdominal radiograph 2314 hr today. Chest CTA 06/13/2014.  FINDINGS: The feeding tube tip projects just above the left hemidiaphragm along the left cardiophrenic angle (arrow). Abnormal mediastinal contour re- demonstrated and on 06/13/2014 a dissected an enlarged thoracic aorta was demonstrated. Lower lung volumes. Increased veiling opacity at the right lung base. No superimposed pneumothorax. Increased pulmonary vascularity without overt edema. Right side PICC line appears stable. Superimposed pacing or resuscitation pads and EKG leads.  IMPRESSION: 1. Feeding tube tip projects at the level of the distal thoracic esophagus just above the diaphragm. 2. Abnormal cardiac and mediastinal contours related to cardiomegaly and thoracic aortic  dissection and aneurysmal enlargement as seen on 06/13/2014. 3. Increased right pleural effusion.   Electronically Signed   By: Genevie Ann [name]   On: 07/14/2014 00:10   Dg Abd Portable 1v  07/13/2014   ADDENDUM REPORT: 07/13/2014 23:38  ADDENDUM: CORRECTED IMPRESSION:  Normal NON obstructive bowel gas pattern. Interval removal of enteric  tube.   Electronically Signed   By: Lucienne Capers [name]    On: 07/13/2014 23:38   07/13/2014   CLINICAL DATA:  Abdominal pain tonight.  EXAM: PORTABLE ABDOMEN - 1 VIEW  COMPARISON:  07/12/2014  FINDINGS: The enteric tube appears to been removed since the previous study. Visualized bowel gas pattern is normal. Stool in the colon. No small or large bowel distention. No radiopaque stones. Surgical clips in the abdomen and left pelvis. Vascular calcifications.  IMPRESSION: Normal on obstructive bowel gas pattern. Interval removal of enteric tube.  Electronically Signed: By: Lucienne Capers [name] On: 07/13/2014 23:23   Dg Swallowing Func-speech Pathology  07/14/2014    Objective Swallowing Evaluation:    Patient Details  Name: [name] MRN: [id number] Date of Birth: 1945-10-31  Today's Date: 07/14/2014 Time: SLP Start Time (ACUTE ONLY): 10:31-SLP Stop Time (ACUTE ONLY): 10:50 SLP Time Calculation (min) (ACUTE ONLY): 19 min  Past Medical History:  Past Medical History  Diagnosis Date  . Chest pain   . Hypertension   . Aortic aneurysm 1993  . 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 Pneumonia   . Aortic valve replaced      05/2014 transfered from [place] to [facility] for HF in the setting  of failed valve repair. Due to thrombosis of valve after pt stopped  coumadin. CT surgery considering AVR, delayed due to GI bleed  . Hx of aortic aneurysm repair 06/14/2014  . Atrial fibrillation 05/2014  . Gastritis and gastroduodenitis with hemorrhage 05/2014    severe.   . Tubular adenoma of colon 12/2012  . DVT of upper extremity (deep vein thrombosis) 05/2014    left  . Acute blood loss anemia 05/2014   . CAD (coronary artery disease)     minimal CAD on cath 05/2014   Past Surgical History:  Past Surgical History  Procedure Laterality Date  . Abdominal aortic aneurysm repair  1993    At [facility], Unspecified  . Splenectomy    . Cardiac valve replacement      At [facility]  . Esophagogastroduodenoscopy  Aug 2014    [facility]: large duodenal ulcer with visible vessel,  s/p clip and epi,  erosive gastritis. +h.pylori serology, treated with amoxicillin and  biaxin.   . Colonoscopy N/A 12/27/2012    Procedure: COLONOSCOPY;  Surgeon: Danie Binder, MD;  Location: [facility];  Service: Endoscopy;  Laterality: N/A;  8:45-moved to 855   . Tee without cardioversion N/A 06/15/2014    Procedure: TRANSESOPHAGEAL ECHOCARDIOGRAM (TEE);  Surgeon: Sueanne Margarita, MD;  Location: [facility] ENDOSCOPY;  Service: Cardiovascular;   Laterality: N/A;  . Cardiac catheterization N/A 06/15/2014    Procedure: Fluoroscopy Guidance;  Surgeon: Josue Hector, MD;   Location: [place] CV LAB;  Service: Cardiovascular;  Laterality: N/A;  . Esophagogastroduodenoscopy N/A 06/18/2014    Procedure: ESOPHAGOGASTRODUODENOSCOPY (EGD);  Surgeon: Jerene Bears, MD;   Location: [facility] ENDOSCOPY;  Service: Endoscopy;  Laterality: N/A;  . Peripheral vascular catheterization  06/19/2014    Procedure: Aortic Arch Angiography;  Surgeon: Jettie Booze, MD;   Location:  [place] INVASIVE CV LAB;  Service: Cardiovascular;;  . Esophagogastroduodenoscopy N/A 06/21/2014    Procedure: ESOPHAGOGASTRODUODENOSCOPY (EGD);  Surgeon: Jerene Bears, MD;   Location: [facility] ENDOSCOPY;  Service: Endoscopy;  Laterality: N/A;  . Esophagogastroduodenoscopy N/A 06/21/2014    Procedure: ESOPHAGOGASTRODUODENOSCOPY (EGD);  Surgeon: Jerene Bears, MD;   Location: [place];  Service: Gastroenterology;  Laterality: N/A;  . Multiple extractions with alveoloplasty N/A 06/27/2014    Procedure: Extraction of tooth #'s 3,4,5,13,14,21,23,24,25,26, with  alveoloplasty and gross debridement of remaining teeth.;  Surgeon: [name], DDS;  Location: [place];  Service: Oral Surgery;  Laterality:  N/A;   HPI:  Other Pertinent Information: 69 y.o. male with PMH significant for A fib,  History of aortic aneurysm repair, Aortic valve insufficiency, St Jude  mechanical Valve with malfunction, last hospitalization 2 weeks ago for  cardiogenic shock from malfunctioning valve, GI  bleed from gastric cardiac  erosion, he was discharge to CIR for Rehab. Plan to have AVR redo by Dr  Nils Pyle at the end of June. He underwent Multiple tooth extractions on  07/26/2014. Post op complicated by VT arrest requiring ~ 2 min of CPR.  Intubated 07/06/14. Extubated 07/08/14.Continues to have copious oral  bleeding, pt is on heparin.   No Data Recorded  Assessment / Plan / Recommendation CHL IP CLINICAL IMPRESSIONS 07/14/2014  Therapy Diagnosis Moderate oral phase dysphagia;Moderate pharyngeal phase  dysphagia  Clinical Impression Pt with congested breathing and stridor upon exhale  prior to study with wet vocal quality concerning for poor secretion  managment. Pts swallow function initially appearing improved compared to  FEES report on 07-10-14. However as study progressed pt exhibited poor  protection of airway with residuals in valleculae filling up and spilling  into the laryngeal vestibule to become silently aspirated with ineffective  volitional cough. Cueing for additional swallow ineffective at clearing  residuals with fatigue and weakness noted.  Continue NPO at this time with  therapeutic PO trials with SLP only. Recommend medicine via alternative  means. ST to follow up.       CHL IP TREATMENT RECOMMENDATION 07/14/2014  Treatment Recommendations Therapy as outlined in treatment plan below     CHL IP DIET RECOMMENDATION 07/14/2014  SLP Diet Recommendations NPO  Liquid Administration via (None)  Medication Administration Via alternative means  Compensations (None)  Postural Changes and/or Swallow Maneuvers (None)     CHL IP OTHER RECOMMENDATIONS 07/14/2014  Recommended Consults (None)  Oral Care Recommendations Oral care QID  Other Recommendations (None)     CHL IP FOLLOW UP RECOMMENDATIONS 07/11/2014  Follow up Recommendations Skilled Nursing facility     [facility] IP FREQUENCY AND DURATION 07/10/2014  Speech Therapy Frequency (ACUTE ONLY) min 2x/week  Treatment Duration 2 weeks     Pertinent Vitals/Pain      SLP Swallow Goals No flowsheet data found.  No flowsheet data found.    CHL IP REASON FOR REFERRAL 07/10/2014  Reason for Referral Objectively evaluate swallowing function     CHL IP ORAL PHASE 07/14/2014  Lips (None)  Tongue (None)  Mucous membranes (None)  Nutritional status (None)  Other (None)  Oxygen therapy (None)  Oral Phase Impaired  Oral - Pudding Teaspoon (None)  Oral - Pudding Cup (None)  Oral - Honey Teaspoon (None)  Oral - Honey Cup (None)  Oral - Honey Syringe (None)  Oral - Nectar Teaspoon (None)  Oral - Nectar Cup (None)  Oral - Nectar Straw (None)  Oral Artist (  None)  Oral - Ice Chips (None)  Oral - Thin Teaspoon (None)  Oral - Thin Cup (None)  Oral - Thin Straw (None)  Oral - Thin Syringe (None)  Oral - Puree (None)  Oral - Mechanical Soft (None)  Oral - Regular (None)  Oral - Multi-consistency (None)  Oral - Pill (None)  Oral Phase - Comment (None)      CHL IP PHARYNGEAL PHASE 07/14/2014  Pharyngeal Phase Impaired  Pharyngeal - Pudding Teaspoon (None)  Penetration/Aspiration details (pudding teaspoon) (None)  Pharyngeal - Pudding Cup (None)  Penetration/Aspiration details (pudding cup) (None)  Pharyngeal - Honey Teaspoon (None)  Penetration/Aspiration details (honey teaspoon) (None)  Pharyngeal - Honey Cup (None)  Penetration/Aspiration details (honey cup) (None)  Pharyngeal - Honey Syringe (None)  Penetration/Aspiration details (honey syringe) (None)  Pharyngeal - Nectar Teaspoon (None)  Penetration/Aspiration details (nectar teaspoon) (None)  Pharyngeal - Nectar Cup (None)  Penetration/Aspiration details (nectar cup) (None)  Pharyngeal - Nectar Straw (None)  Penetration/Aspiration details (nectar straw) (None)  Pharyngeal - Nectar Syringe (None)  Penetration/Aspiration details (nectar syringe) (None)  Pharyngeal - Ice Chips (None)  Penetration/Aspiration details (ice chips) (None)  Pharyngeal - Thin Teaspoon (None)  Penetration/Aspiration details (thin teaspoon) (None)  Pharyngeal - Thin  Cup (None)  Penetration/Aspiration details (thin cup) (None)  Pharyngeal - Thin Straw (None)  Penetration/Aspiration details (thin straw) (None)  Pharyngeal - Thin Syringe (None)  Penetration/Aspiration details (thin syringe') (None)  Pharyngeal - Puree (None)  Penetration/Aspiration details (puree) (None)  Pharyngeal - Mechanical Soft (None)  Penetration/Aspiration details (mechanical soft) (None)  Pharyngeal - Regular (None)  Penetration/Aspiration details (regular) (None)  Pharyngeal - Multi-consistency (None)  Penetration/Aspiration details (multi-consistency) (None)  Pharyngeal - Pill (None)  Penetration/Aspiration details (pill) (None)  Pharyngeal Comment (None)      No flowsheet data found.  No flowsheet data found.        Arvil Chaco MA, CCC-SLP Acute Care Speech Language Pathologist    Levi Aland 07/14/2014, 11:14 AM     Procedure/Significant Events: 5/20 TEE'; - LVEF=55% to 60%.- Aortic valve: St. Jude Mechanical AVR is present. Only one of the leaflets visualized as moving; significantly reduced; other leaflet is immobile.  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 - Mitral valve: moderate regurgitation. - Left atrium: severely dilated. No thrombus in the atrial cavity or appendage. - Right atrium: moderately dilated. Nothrombus in the atrial cavity or appendage. -Tricuspid valve: moderate regurgitation. 5/23 EGD by Dr. Zenovia Jarred; - Two gastric cardia erosions without active bleeding, presumed cause of recent melena- Very mild bulbar duodenitis  5/26 repeat EGD by Dr. Zenovia Jarred; - Large blood clot and fresh blood removed from the proximal stomach - Gastric cardia erosion with visible vessel treated with epinephrine injection and hemostatic clip -4  5/24 upper extremity Doppler;acute deep vein thrombosis involving axillary and brachial veins LUE -Superficial thrombosis in Basilic  vein.  ASSESSMENT/PLAN:   69 yo male s/p admission with VT arrest/cardiac arrest, thrombosed aortic valve, LUE DVT on heparin, chronic a fib, with severe oropharyngeal dysphagia with inadequate oral intake.Pt in need of nutritional supplementation. Given recent bacteremia and significant cardiac history, pt to be discussed with GI attendings tomorrow morning to review safety of PEG placement.      LOS: 10 days   Hvozdovic, Deloris Ping 07/15/2014, Pager [phone]  ________________________________________________________________________  Velora Heckler GI MD note:  I personally examined the patient, reviewed the data and agree  with the assessment and plan described above.  He is at elevated risk for PEG related complications given severe heart disease, current blood thinners, recent blood culture + bacteremia.  He has defib pads in place on his abdomen.  His mental status does not seem fully intact and he has pulled NG feeding tubes in past, recently.  Will plan to discuss PEG placement with my partners, Dr. Carlean Purl who is coming on service tomorrow.    Owens Loffler, MD [facility] Pager [phone]

## 2014-07-15 NOTE — Progress Notes (Signed)
[place] for Heparin  Indication: DVT (also mechanical AV thrombosis & Afib)  No Known Allergies  Patient Measurements: Height: 5\' 8"  (172.7 cm) Weight: 233 lb 4 oz (105.8 kg) IBW/kg (Calculated) : 68.4 Heparin dosing weight = 91.6 kg  Vital Signs: Temp: 97.7 F (36.5 C) (06/19 1600) Temp Source: Axillary (06/19 1600) BP: 147/81 mmHg (06/19 2015) Pulse Rate: 65 (06/19 1600)  Labs:  Recent Labs  07/13/14 0340 07/14/14 0330  07/15/14 0428 07/15/14 1115 07/15/14 1400 07/15/14 2000  HGB 9.3* 8.9*  --  8.9*  --   --   --   HCT 29.8* 29.4*  --  29.7*  --   --   --   PLT 345 348  --  346  --   --   --   HEPARINUNFRC 0.60 0.49  < >  --  1.66* 0.59 0.70  CREATININE 1.87* 2.08*  --  2.41*  --   --   --   < > = values in this interval not displayed.  Estimated Creatinine Clearance: 35.1 mL/min (by C-G formula based on Cr of 2.41).   Assessment: 69 yo male with Afib/AVR, s/p tooth extraction last week.   Heparin held for most of 6/14 d/t ongoing oral bleeding - this has resolved.  Heparin is currently infusing at 1050 units/hr and heparin level has increased despite rate decrease.  Renal function has also worsened likely reducing drug clearance.  Will reduce rate accordingly.  Goal of Therapy:  Heparin level 0.3-0.5 Monitor platelets by anticoagulation protocol: Yes   Plan:  Decrease heparin to 900 units/hr F/u 6 hr heparin level Daily HL and CBC Monitor for s/sx of bleeding F/u long-term anticoag plan  [employer], Pharm.D., BCPS Clinical Pharmacist Pager [phone] 07/15/2014 9:14 PM

## 2014-07-16 DIAGNOSIS — E43 Unspecified severe protein-calorie malnutrition: Secondary | ICD-10-CM | POA: Diagnosis present

## 2014-07-16 DIAGNOSIS — R633 Feeding difficulties, unspecified: Secondary | ICD-10-CM | POA: Diagnosis present

## 2014-07-16 DIAGNOSIS — I330 Acute and subacute infective endocarditis: Principal | ICD-10-CM

## 2014-07-16 DIAGNOSIS — R1310 Dysphagia, unspecified: Secondary | ICD-10-CM

## 2014-07-16 LAB — MAGNESIUM: MAGNESIUM: 2.2 mg/dL (ref 1.7–2.4)

## 2014-07-16 LAB — BASIC METABOLIC PANEL
ANION GAP: 10 (ref 5–15)
BUN: 45 mg/dL — ABNORMAL HIGH (ref 6–20)
CO2: 25 mmol/L (ref 22–32)
Calcium: 9.2 mg/dL (ref 8.9–10.3)
Chloride: 112 mmol/L — ABNORMAL HIGH (ref 101–111)
Creatinine, Ser: 2.97 mg/dL — ABNORMAL HIGH (ref 0.61–1.24)
GFR calc Af Amer: 24 mL/min — ABNORMAL LOW (ref >60)
GFR, EST NON AFRICAN AMERICAN: 20 mL/min — AB (ref >60)
GLUCOSE: 204 mg/dL — AB (ref 65–99)
Potassium: 3.7 mmol/L (ref 3.5–5.1)
SODIUM: 147 mmol/L — AB (ref 135–145)

## 2014-07-16 LAB — CBC WITH DIFFERENTIAL/PLATELET
BASOS PCT: 0 % (ref 0–1)
Basophils Absolute: 0 10*3/uL (ref 0.0–0.1)
EOS ABS: 0 10*3/uL (ref 0.0–0.7)
EOS PCT: 0 % (ref 0–5)
HCT: 29.2 % — ABNORMAL LOW (ref 39.0–52.0)
HEMOGLOBIN: 8.8 g/dL — AB (ref 13.0–17.0)
Lymphocytes Relative: 8 % — ABNORMAL LOW (ref 12–46)
Lymphs Abs: 0.7 10*3/uL (ref 0.7–4.0)
MCH: 27.6 pg (ref 26.0–34.0)
MCHC: 30.1 g/dL (ref 30.0–36.0)
MCV: 91.5 fL (ref 78.0–100.0)
MONO ABS: 1.2 10*3/uL — AB (ref 0.1–1.0)
Monocytes Relative: 15 % — ABNORMAL HIGH (ref 3–12)
NEUTROS ABS: 6.3 10*3/uL (ref 1.7–7.7)
NEUTROS PCT: 77 % (ref 43–77)
Platelets: 339 10*3/uL (ref 150–400)
RBC: 3.19 MIL/uL — ABNORMAL LOW (ref 4.22–5.81)
RDW: 19.8 % — ABNORMAL HIGH (ref 11.5–15.5)
WBC: 8.2 10*3/uL (ref 4.0–10.5)

## 2014-07-16 LAB — CARBOXYHEMOGLOBIN
Carboxyhemoglobin: 1.5 % (ref 0.5–1.5)
Methemoglobin: 1.3 % (ref 0.0–1.5)
O2 Saturation: 66.6 %
Total hemoglobin: 9.1 g/dL — ABNORMAL LOW (ref 13.5–18.0)

## 2014-07-16 LAB — HEPARIN LEVEL (UNFRACTIONATED)
Heparin Unfractionated: 0.38 IU/mL (ref 0.30–0.70)
Heparin Unfractionated: 0.19 IU/mL — ABNORMAL LOW (ref 0.30–0.70)

## 2014-07-16 MED ORDER — CEFAZOLIN SODIUM-DEXTROSE 2-3 GM-% IV SOLR
2.0000 g | INTRAVENOUS | Status: DC
  Filled 2014-07-16: qty 50

## 2014-07-16 MED ORDER — LORAZEPAM 2 MG/ML IJ SOLN
0.5000 mg | Freq: Every evening | INTRAMUSCULAR | Status: DC | PRN
  Administered 2014-07-17: 0.5 mg via INTRAVENOUS
  Filled 2014-07-16: qty 1

## 2014-07-16 MED ORDER — SODIUM CHLORIDE 0.9 % IV SOLN: INTRAVENOUS | Status: DC

## 2014-07-16 MED ORDER — LORAZEPAM 2 MG/ML IJ SOLN: 0.5000 mg | Freq: Once | INTRAMUSCULAR | Status: DC

## 2014-07-16 MED ORDER — HEPARIN (PORCINE) IN NACL 100-0.45 UNIT/ML-% IJ SOLN: 900.0000 [IU]/h | INTRAMUSCULAR | Status: DC

## 2014-07-16 MED ORDER — CEFAZOLIN SODIUM-DEXTROSE 2-3 GM-% IV SOLR: 2.0000 g | Freq: Once | INTRAVENOUS | Status: DC

## 2014-07-16 MED ORDER — FUROSEMIDE 10 MG/ML IJ SOLN
40.0000 mg | Freq: Every day | INTRAMUSCULAR | Status: DC
  Administered 2014-07-16: 40 mg via INTRAVENOUS
  Filled 2014-07-16: qty 4

## 2014-07-16 MED ORDER — POTASSIUM CHLORIDE 10 MEQ/100ML IV SOLN
10.0000 meq | Freq: Two times a day (BID) | INTRAVENOUS | Status: DC
  Administered 2014-07-16 – 2014-07-17 (×3): 10 meq via INTRAVENOUS
  Filled 2014-07-16 (×5): qty 100

## 2014-07-16 MED ORDER — LORAZEPAM 2 MG/ML IJ SOLN
INTRAMUSCULAR | Status: AC
  Filled 2014-07-16: qty 1

## 2014-07-16 MED ORDER — AMPICILLIN SODIUM 2 G IJ SOLR
2.0000 g | Freq: Three times a day (TID) | INTRAMUSCULAR | Status: DC
  Administered 2014-07-16 – 2014-07-17 (×4): 2 g via INTRAVENOUS
  Filled 2014-07-16 (×5): qty 2000

## 2014-07-16 MED ORDER — HYDROMORPHONE HCL 1 MG/ML IJ SOLN
0.2500 mg | INTRAMUSCULAR | Status: DC | PRN
  Administered 2014-07-16 – 2014-07-17 (×7): 0.25 mg via INTRAVENOUS
  Filled 2014-07-16 (×7): qty 1

## 2014-07-16 MED ORDER — HEPARIN (PORCINE) IN NACL 100-0.45 UNIT/ML-% IJ SOLN
1000.0000 [IU]/h | INTRAMUSCULAR | Status: DC
  Administered 2014-07-16: 1000 [IU]/h via INTRAVENOUS
  Filled 2014-07-16 (×2): qty 250

## 2014-07-16 NOTE — Progress Notes (Signed)
Speech Language Pathology Treatment: Dysphagia  Patient Details Name: [name] MRN: [id number] DOB: [birth year] Today's Date: 07/16/2014 Time: 1941-7408 SLP Time Calculation (min) (ACUTE ONLY): 8 min  Assessment / Plan / Recommendation Clinical Impression  No POs provided as SLP learned of plans for possible PEG today while assessing pt ability to manage secretions. Pt with ongoing wet vocal quality with no initiative to clear. With max verbal cues pt able to attempt throat clearing, though force for oral expectoration not demonstrated. Pt also still not able to follow cues in a timely manner for volitional swallows. Though POs were not given, pt does not demonstrate significant improvement in ability to protect airway. Recommend pt proceed with PEG procedure as consistent nutrition is very important and pts function will likely fluctuate with any further procedures. SLP will continue to follow for therapeutic PO trials.    HPI Other Pertinent Information: 69 y.o. 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 Plan to have AVR redo by Dr Nils Pyle at the end of June. He underwent Multiple tooth extractions on 07/16/2014. Post op complicated by VT arrest requiring ~ 2 min of CPR. Intubated 07/06/14. Extubated 07/08/14.Continues to have copious oral bleeding, pt is on heparin.    Pertinent Vitals    SLP Plan  Continue with current plan of care    Recommendations Diet recommendations: NPO              Oral Care Recommendations: Oral care QID Plan: Continue with current plan of care    GO    [facility], MA CCC-SLP [phone]  Lynann Beaver 07/16/2014, 9:03 AM

## 2014-07-16 NOTE — Progress Notes (Addendum)
Patient increasingly agitated and restless this morning, pulling at lines and tubing, and hollering in room. Patient alert and oriented to self, but disoriented to time and situation. Mental status different from baseline. Dr. Thereasa Solo made aware. New orders given for 0.5 mg ativan IV.  Update 1400: Patient's daughter [name] at bedside. Patient appears calm, more alert with family in room. Ativan withheld for now.

## 2014-07-16 NOTE — Progress Notes (Signed)
Daily Rounding Note  07/16/2014, 9:18 AM  LOS: 11 days   SUBJECTIVE:        Pulled his CVP line apart overnight. Only 100 ml urine recorded for yesterday.   OBJECTIVE:         Vital signs in last 24 hours:    Temp:  [96.2 F (35.7 C)-97.7 F (36.5 C)] 96.7 F (35.9 C) (06/20 0742) Pulse Rate:  [65-77] 77 (06/20 0742) Resp:  [13-18] 18 (06/20 0742) BP: (128-161)/(61-116) 160/90 mmHg (06/20 0829) SpO2:  [99 %-100 %] 100 % (06/20 0742) Weight:  [233 lb 4 oz (105.8 kg)] 233 lb 4 oz (105.8 kg) (06/20 0339) Last BM Date: 07/14/14 Filed Weights   07/14/14 0800 07/15/14 0400 07/16/14 0339  Weight: 232 lb 5.8 oz (105.4 kg) 233 lb 4 oz (105.8 kg) 233 lb 4 oz (105.8 kg)   General: comfortable, NAD.  Looks chronically ill   Heart: RRR rate in 70s.  Chest: clear bil but wet vocal quality.  External pacer pad overlies lower chest/subzyphoid region Abdomen: soft, NT, ND.  + ventral hernia.  Extremities: no CCE.  Palmar erythema Neuro/Psych:  Oriented to place, self, year but is confused.   Intake/Output from previous day: 06/19 0701 - 06/20 0700 In: 1228.4 [I.V.:928.4; IV Piggyback:300] Out: 435 [Urine:435]  Intake/Output this shift:    Lab Results:  Recent Labs  07/14/14 0330 07/15/14 0428 07/16/14 0406  WBC 10.3 9.5 8.2  HGB 8.9* 8.9* 8.8*  HCT 29.4* 29.7* 29.2*  PLT 348 346 339   BMET  Recent Labs  07/14/14 0330 07/15/14 0428 07/16/14 0800  NA 148* 151* 147*  K 3.5 3.6 3.7  CL 113* 116* 112*  CO2 25 26 25   GLUCOSE 120* 118* 204*  BUN 34* 36* 45*  CREATININE 2.08* 2.41* 2.97*  CALCIUM 9.3 9.7 9.2   LFT  Recent Labs  07/14/14 0330 07/15/14 0428  PROT 7.0 7.4  ALBUMIN 2.8* 2.9*  AST 26 32  ALT 16* 20  ALKPHOS 50 46  BILITOT 0.6 0.8    Studies/Results: 07/14/2014    Objective Swallowing Evaluation:     Intubated 07/06/14. Extubated 07/08/14.Continues to have copious oral  bleeding, pt is on  heparin.   No Data Recorded  Assessment / Plan / Recommendation CHL IP CLINICAL IMPRESSIONS 07/14/2014  Therapy Diagnosis Moderate oral phase dysphagia;Moderate pharyngeal phase  dysphagia  Clinical Impression Pt with congested breathing and stridor upon exhale  prior to study with wet vocal quality concerning for poor secretion  managment. Pts swallow function initially appearing improved compared to  FEES report on 07-10-14. However as study progressed pt exhibited poor  protection of airway with residuals in valleculae filling up and spilling  into the laryngeal vestibule to become silently aspirated with ineffective  volitional cough. Cueing for additional swallow ineffective at clearing  residuals with fatigue and weakness noted.  Continue NPO at this time with  therapeutic PO trials with SLP only. Recommend medicine via alternative  means. ST to follow up.       CHL IP TREATMENT RECOMMENDATION 07/14/2014  Treatment Recommendations Therapy as outlined in treatment plan below     CHL IP DIET RECOMMENDATION 07/14/2014  SLP Diet Recommendations NPO  Liquid Administration via (None)  Medication Administration Via alternative means  Compensations (None)  Postural Changes and/or Swallow Maneuvers (None)     CHL IP OTHER RECOMMENDATIONS 07/14/2014  Recommended Consults (None)  Oral Care Recommendations Oral care QID  Other Recommendations (None)     Scheduled Meds: . ampicillin (OMNIPEN) IV  2 g Intravenous 6 times per day  . antiseptic oral rinse  7 mL Mouth Rinse q12n4p  . chlorhexidine  15 mL Mouth/Throat BID  . furosemide  40 mg Intravenous Daily  . hydrALAZINE  7 mg Intravenous Q6H  . LORazepam  0.5 mg Intravenous Once  . metoprolol  7.5 mg Intravenous 3 times per day  . pantoprazole (PROTONIX) IV  40 mg Intravenous Q12H  . potassium chloride  10 mEq Intravenous BID   Continuous Infusions: . sodium chloride Stopped (07/15/14 1217)  . dextrose 50 mL (07/15/14 2125)  . feeding supplement (OSMOLITE 1.2  CAL) Stopped (07/14/14 0100)   PRN Meds:.albuterol, aminocaproic acid, fentaNYL (SUBLIMAZE) injection, hydrALAZINE, morphine injection, [DISCONTINUED] ondansetron **OR** ondansetron (ZOFRAN) IV    ASSESMENT:   *  Dysphagia. OP dysphagia per SLP.  Needs PEG.  Has pulled out previous feeding tubes.  *  Feeding problems and malnutrition *  GIB, CG and hematemesis.  S/p 2 EGDs (second 5/26 with clipping/epi to VV at [facility]): bleeding from gastric erosions. *  Thrombosed mechanical AVR, severe aortic insufficiency, enterococcal endocarditis with cardiogenic shock due to not taking anti-coagulation.  Needs redo valve/aortic root replacement but on hold due to numerous complications *  Diastolic CHF.  *  UE DVT.  On Heparin gtt.  *  VT cardiac arrest post dental extractions 6/9.  *  Stage 3 CKD, oliguric. Worsening renal function may be due to suboptimal hydration.  *  Confusion, has pulled out multiple panda type FT and lines.  *  Anemia.  Normocytic.  Stable. No transfusions to date.     PLAN   *  PEG placement with MAC tomorrow at 10:15, Dr Carlean Purl.  Stopping Heparin drip 0600 tomorrow  *  Complications, risks d/w pt's dtr who is aware of situation. *  Will need abd binder at all times to prevent pt from removing PEG.   *  On ampicillin, will replace tomorrows preop dose with Ancef.  *  Npo after midnight.     Note dtr contacted: Jahking Lesser [phone]  [name]  07/16/2014, 9:18 AM Pager: [phone]  [facility] Attending  I have also seen and assessed the patient and agree with the advanced practitioner's assessment and plan. My Hx and PE same in this complicated and critically ill patient. Have spoken to [name] and Barbera Setters - daughters.  I explained that prior abd surgery - especially splenectomy raises failure rate as adhesions can create problems with positioning of stomach and gastrostomy placement.  The risks and benefits as well as alternatives of endoscopic  procedure(s) have been discussed and reviewed. All questions answered. The patient agrees to proceed.    Gatha Mayer, MD, [facility] [phone] (pager) 07/16/2014 3:53 PM

## 2014-07-16 NOTE — Progress Notes (Signed)
[facility] for Heparin  Indication: DVT (also mechanical AV thrombosis & Afib)  No Known Allergies  Patient Measurements: Height: 5\' 8"  (172.7 cm) Weight: 233 lb 4 oz (105.8 kg) IBW/kg (Calculated) : 68.4 Heparin dosing weight = 91.6 kg  Vital Signs: Temp: 96.7 F (35.9 C) (06/20 0339) Temp Source: Axillary (06/20 0339) BP: 154/61 mmHg (06/20 0400) Pulse Rate: 77 (06/19 2353)  Labs:  Recent Labs  07/14/14 0330  07/15/14 0428  07/15/14 1400 07/15/14 2000 07/16/14 0406  HGB 8.9*  --  8.9*  --   --   --  8.8*  HCT 29.4*  --  29.7*  --   --   --  29.2*  PLT 348  --  346  --   --   --  339  HEPARINUNFRC 0.49  < >  --   < > 0.59 0.70 0.38  CREATININE 2.08*  --  2.41*  --   --   --   --   < > = values in this interval not displayed.  Estimated Creatinine Clearance: 35.1 mL/min (by C-G formula based on Cr of 2.41).   Assessment: 69 yo male with Afib/AVR, s/p tooth extraction last week.   Heparin held for most of 6/14 d/t ongoing oral bleeding - this has resolved.  Heparin is currently infusing at 900 units/hr and heparin level therapeutic.  Goal of Therapy:  Heparin level 0.3-0.5 Monitor platelets by anticoagulation protocol: Yes   Plan:  Continue heparin at 900 units/hr F/u 6 hour heparin level F/u long-term anticoag plan  Sherlon Handing, PharmD, BCPS Clinical pharmacist, pager [phone] 07/16/2014 4:45 AM

## 2014-07-16 NOTE — Consult Note (Addendum)
Asked to see [name] by Cherene Altes, MD for palliative consult for goals of care in the setting of due multiple co-morbidities.   Impression: 1. Aortic Insufficiency with aortic dissection - for Bentall procedure when medically stable and strong enough 2. Enterococcal Bacteremia - and Amp and Ancef 3. Nutritional Status - for PEG secondary to dysphagia of unknown origin 4. Dilerium - possibly related to MS reaction/toxicity 5. Renal - acute on chronic CKD III, est. GFR 20 6. CHF - compensated.  Recommendations: 1. Institute dilerium precautions: orientation and reassurance, sleep, anxiolytic for agitation 2. Pain management - low dose dilaudid prn 3. Consider neuro imaging to evaluation dysphagia 4. Full code - appropriate and consistent with patient wishes 5. Maya require in-patient rehab in order to be strong enough for valve replacement surgery. 6. Renal - clearly states he does not want dialysis 6. Will need to revisit goals of care as time goes on.  HPI  This is a 69 y.o. male with PMH significant for A fib, History of aortic aneurysm repair, Aortic valve insufficiency, St Jude mechanical Valve with malfunction, last hospitalization 06/13/2014-06/29/2014 for cardiogenic shock from malfunctioning valve, GI bleed from gastric cardiac erosion. He was discharged to [facility] for Rehab and readmitted to dental work back on 07/20/2014. On 6/09 he underwent Multiple extraction of tooth and other dental procedures in preparation of AVR. Unfortunately, on 07/06/2014 he developed ventricular tachycardia and arrested requiring CPR for 2 minutes. He remains full code. Plan to have AVR redo by Dr Nils Pyle at the end of June.   Patient has been receiving heparin Gtt for Mechanical Valve. His hospitalization has been complicated with multiple medical problems including acute renal failure, urinary tract infection, enterococcal bacteremia, atrial fibrillation with ventricular tachycardia, acute respiratory  failure with hypoxia, DVT to the left upper extremity, CHF, delirium (pulling out lines) and continuing concern for GI bleed. He apparently is going to receive a PEG tomorrow a.m due to oral phase and parapharyngeal dysphagia. Main family/care takers daughter, [name] and son [name] (who I spoke with on the phone). He last saw his dad on the 07/14/2014 and he is planning to come by again tomorrow.   He is currently been followed by infectious disease, cardiology and general internal medicine for his medical problems.  Scheduled Meds:  ampicillin (OMNIPEN) IV  2 g Intravenous Q8H   antiseptic oral rinse  7 mL Mouth Rinse q12n4p   [START ON 07/07/2014]  ceFAZolin (ANCEF) IV  2 g Intravenous To OR   chlorhexidine  15 mL Mouth/Throat BID   furosemide  40 mg Intravenous Daily   hydrALAZINE  7 mg Intravenous Q6H   LORazepam       LORazepam  0.5 mg Intravenous Once   metoprolol  7.5 mg Intravenous 3 times per day   pantoprazole (PROTONIX) IV  40 mg Intravenous Q12H   potassium chloride  10 mEq Intravenous BID   Continuous Infusions:  sodium chloride Stopped (07/15/14 1217)   sodium chloride Stopped (07/16/14 1115)   dextrose 50 mL/hr at 07/16/14 0700   feeding supplement (OSMOLITE 1.2 CAL) Stopped (07/14/14 0100)   heparin 900 Units/hr (07/16/14 0945)   PRN Meds:.albuterol, aminocaproic acid, fentaNYL (SUBLIMAZE) injection, hydrALAZINE, morphine injection, [DISCONTINUED] ondansetron **OR** ondansetron (ZOFRAN) IV  Past Medical History  Diagnosis Date   Chest pain    Hypertension    Aortic aneurysm 1993   History of splenectomy 1994    thrombocytopenia   Pneumonia    Aortic valve replaced  05/2014 [facility] from [facility] to [facility] for HF in the setting of failed valve repair. 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 CT surgery considering AVR, delayed due to GI bleed   Hx of aortic aneurysm repair 06/14/2014   Atrial fibrillation 05/2014    Gastritis and gastroduodenitis with hemorrhage 05/2014    severe.    Tubular adenoma of colon 12/2012   DVT of upper extremity (deep vein thrombosis) 05/2014    left   Acute blood loss anemia 05/2014    CAD (coronary artery disease)     minimal CAD on cath 05/2014    Past Surgical History  Procedure Laterality Date   Abdominal aortic aneurysm repair  1993    At [facility], Unspecified   Splenectomy     Cardiac valve replacement      At [facility]   Esophagogastroduodenoscopy  Aug 2014    [facility]: large duodenal ulcer with visible vessel, s/p clip and epi, erosive gastritis. +h.pylori serology, treated with amoxicillin and biaxin.    Colonoscopy N/A 12/27/2012    Procedure: COLONOSCOPY;  Surgeon: Danie Binder, MD;  [facility]O [facility];  Service: Endoscopy;  Laterality: N/A;  8:45-moved to [place] without cardioversion N/A 06/15/2014    Procedure: TRANSESOPHAGEAL ECHOCARDIOGRAM (TEE);  Surgeon: Sueanne Margarita, MD;  Location: [facility] ENDOSCOPY;  Service: Cardiovascular;  Laterality: N/A;   Cardiac catheterization N/A 06/15/2014    Procedure: Fluoroscopy Guidance;  Surgeon: Josue Hector, MD;  Location: [place] CV LAB;  Service: Cardiovascular;  Laterality: N/A;   Esophagogastroduodenoscopy N/A 06/18/2014    Procedure: ESOPHAGOGASTRODUODENOSCOPY (EGD);  Surgeon: Jerene Bears, MD;  Location: [facility] ENDOSCOPY;  Service: Endoscopy;  Laterality: N/A;   Peripheral vascular catheterization  06/19/2014    Procedure: Aortic Arch Angiography;  Surgeon: Jettie Booze, MD;  Location: [place] CV LAB;  Service: Cardiovascular;;   Esophagogastroduodenoscopy N/A 06/21/2014    Procedure: ESOPHAGOGASTRODUODENOSCOPY (EGD);  Surgeon: Jerene Bears, MD;  Location: [facility] ENDOSCOPY;  Service: Endoscopy;  Laterality: N/A;   Esophagogastroduodenoscopy N/A 06/21/2014    Procedure: ESOPHAGOGASTRODUODENOSCOPY (EGD);  Surgeon: Jerene Bears, MD;  Location: [place];  Service:  Gastroenterology;  Laterality: N/A;   Multiple extractions with alveoloplasty N/A 06/29/2014    Procedure: Extraction of tooth #'s 3,4,5,13,14,21,23,24,25,26, with alveoloplasty and gross debridement of remaining teeth.;  Surgeon: [name], DDS;  Location: [place];  Service: Oral Surgery;  Laterality: N/A;    History   Social History   Marital Status: Married    Spouse Name: N/A   Number of Children: 76: 2 sons, 2 daughters, 62 grands   Years of Education: HSG   Occupational History   Retired    Social History Main Topics   Smoking status: Former Smoker   Smokeless tobacco: Not on file   Alcohol Use: Yes     Comment: 3-4 beers per day   Drug Use: No   Sexual Activity: Not on file   Other Topics Concern   None   Social History Narrative   Cody. 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 He married about 44. His wife has vascular dementia, s/p Kidney transplant and is bed bound/100% dependent ADLS. Patient has two daughters and two son in good health. He lives in his own home and a son and a daughter live with he and his wife. Question of family dynamic issues. Has considered advanced care: wants full resuscitation and  intubation if needed, does NOT want HD or resuscitiation if brain injured.     Palliative Care Status: full code at this time  Palliative Prophylaxis: no need for additional laxative. Delerium protocol- under nursing orders, change pain med to diluadid to avoid possible rxn/toxicity. PE:  Filed Vitals:   07/16/14 1217  BP: 181/113  Pulse:   Temp:   Resp:    General: patient is oriented but somehow sleepy. Daughter Judeen Hammans in room. HEENT: dry mouth and fresh teeth extraction wounds Cor: tachycardia, normal heart sounds. No murmurs and no added sounds. Pul: CTA bilaterally Abd: soft, non-tender, normal bowel sounds  Neuro: alert and oriented X3 but somehow sleepy during the interview. Able to answer questions  appropriately. Follows commands. Extremities: no edema. Moves all extremities.  Derm: no rashes seen on exam  CBC Latest Ref Rng 07/16/2014 07/15/2014 07/14/2014  WBC 4.0 - 10.5 K/uL 8.2 9.5 10.3  Hemoglobin 13.0 - 17.0 g/dL 8.8(L) 8.9(L) 8.9(L)  Hematocrit 39.0 - 52.0 % 29.2(L) 29.7(L) 29.4(L)  Platelets 150 - 400 K/uL 339 346 348    CMP Latest Ref Rng 07/16/2014 07/15/2014 07/14/2014  Glucose 65 - 99 mg/dL 204(H) 118(H) 120(H)  BUN 6 - 20 mg/dL 45(H) 36(H) 34(H)  Creatinine 0.61 - 1.24 mg/dL 2.97(H) 2.41(H) 2.08(H)  Sodium 135 - 145 mmol/L 147(H) 151(H) 148(H)  Potassium 3.5 - 5.1 mmol/L 3.7 3.6 3.5  Chloride 101 - 111 mmol/L 112(H) 116(H) 113(H)  CO2 22 - 32 mmol/L 25 26 25   Calcium 8.9 - 10.3 mg/dL 9.2 9.7 9.3  Total Protein 6.5 - 8.1 g/dL - 7.4 7.0  Total Bilirubin 0.3 - 1.2 mg/dL - 0.8 0.6  Alkaline Phos 38 - 126 U/L - 46 50  AST 15 - 41 U/L - 32 26  ALT 17 - 63 U/L - 20 16(L)     Imaging  cxr 07/14/2014 Revealed "Abnormal cardiac and mediastinal contours related to cardiomegaly and thoracic aortic dissection and aneurysmal enlargement as seen on 06/13/2014  Thank you for this consult.  Greater tahn 50% of encounter spent on education and counselling.  Time in  1304 Time out 1525  Addendum: Did talk with daughter Barbera Setters in regard to the need for rehab prior to surgery and that his renal function was worse than I thought when we talked.  Adella Hare, MD, [place] Team [phone]

## 2014-07-16 NOTE — Progress Notes (Signed)
Procedure(s) (LRB): BENTALL PROCEDURE (N/A) REDO STERNOTOMY (N/A) TRANSESOPHAGEAL ECHOCARDIOGRAM (TEE) (N/A) Subjective: Events noted With renal failure, bacteremia, severe swallow dysfunction needing PEG , delerium- dementia, severe deconditioning and unable to walk I doubt Mr Hoeffner would ever be a candidate for redo AVR/aortic root replacement and recommend medical therapy for his AI.  Goal for him should be SNF.  Objective: Vital signs in last 24 hours: Temp:  [96.2 F (35.7 C)-98.2 F (36.8 C)] 98.1 F (36.7 C) (06/20 1614) Pulse Rate:  [77-84] 81 (06/20 1614) Cardiac Rhythm:  [-] Atrial fibrillation (06/20 0800) Resp:  [16-20] 16 (06/20 1614) BP: (129-181)/(56-116) 181/68 mmHg (06/20 1614) SpO2:  [99 %-100 %] 99 % (06/20 1614) Weight:  [233 lb 4 oz (105.8 kg)] 233 lb 4 oz (105.8 kg) (06/20 0339)  Hemodynamic parameters for last 24 hours: CVP:  [25 mmHg-34 mmHg] 26 mmHg  Intake/Output from previous day: 06/19 0701 - 06/20 0700 In: 1278.4 [I.V.:978.4; IV Piggyback:300] Out: 435 [Urine:435] Intake/Output this shift: Total I/O In: 533.3 [I.V.:433.3; IV Piggyback:100] Out: 405 [Urine:405]    Lab Results:  Recent Labs  07/15/14 0428 07/16/14 0406  WBC 9.5 8.2  HGB 8.9* 8.8*  HCT 29.7* 29.2*  PLT 346 339   BMET:  Recent Labs  07/15/14 0428 07/16/14 0800  NA 151* 147*  K 3.6 3.7  CL 116* 112*  CO2 26 25  GLUCOSE 118* 204*  BUN 36* 45*  CREATININE 2.41* 2.97*  CALCIUM 9.7 9.2    PT/INR: No results for input(s): LABPROT, INR in the last 72 hours. ABG    Component Value Date/Time   PHART 7.371 07/08/2014 0815   HCO3 21.5 07/08/2014 0815   TCO2 22.7 07/08/2014 0815   ACIDBASEDEF 2.9* 07/08/2014 0815   O2SAT 66.6 07/16/2014 0355   CBG (last 3)  No results for input(s): GLUCAP in the last 72 hours.  Assessment/Plan: S/P Procedure(s) (LRB): BENTALL PROCEDURE (N/A) REDO STERNOTOMY (N/A) TRANSESOPHAGEAL ECHOCARDIOGRAM (TEE) (N/A) Not currently  candidate for cardiac surgery. He almost did not survive dental extractions. Not realistic to expect him to be a candidate in the future for cardiac surgery.   LOS: 11 days    Tharon Aquas Trigt III 07/16/2014

## 2014-07-16 NOTE — Progress Notes (Addendum)
[place] for Heparin  Indication: DVT (also mechanical AV thrombosis & Afib)  No Known Allergies  Patient Measurements: Height: 5\' 8"  (172.7 cm) Weight: 233 lb 4 oz (105.8 kg) IBW/kg (Calculated) : 68.4 Heparin dosing weight = 91.6 kg  Vital Signs: Temp: 98.1 F (36.7 C) (06/20 1614) Temp Source: Oral (06/20 1614) BP: 182/85 mmHg (06/20 1738) Pulse Rate: 81 (06/20 1614)  Labs:  Recent Labs  07/14/14 0330  07/15/14 0428  07/15/14 2000 07/16/14 0406 07/16/14 0800 07/16/14 1810  HGB 8.9*  --  8.9*  --   --  8.8*  --   --   HCT 29.4*  --  29.7*  --   --  29.2*  --   --   PLT 348  --  346  --   --  339  --   --   HEPARINUNFRC 0.49  < >  --   < > 0.70 0.38  --  0.19*  CREATININE 2.08*  --  2.41*  --   --   --  2.97*  --   < > = values in this interval not displayed.  Estimated Creatinine Clearance: 28.5 mL/min (by C-G formula based on Cr of 2.97).   Assessment: 69 yo male with Afib/AVR, s/p tooth extraction last week.   Heparin held for most of 6/14 d/t ongoing oral bleeding - this has resolved.  Heparin was therapeutic this morning on 900 units/hr. Infusion was held for PEG placement, but decision made to not perform that procedure today. Heparin resumed ~1000 at same rate. Level was low this evening at 0.19 units/mL. Spoke with RN Joaquim Lai and there were no issues with the line or pauses since the infusion's resumption this morning. Patient has required fluctuating rates of heparin infusion.  Noted patient is to have PEG placement tomorrow and GI ordered heparin to stop 6/21 at 0600  Goal of Therapy:  Heparin level 0.3-0.5 Monitor platelets by anticoagulation protocol: Yes   Plan:  Increase heparin to 1000 units/hr- to be stopped at 0600 Heparin level in 8 hours d/t CrCl <27mL/min Daily HL and CBC F/u long-term anticoag plan  [name] D. Nuriya Stuck, PharmD, BCPS Clinical Pharmacist Pager: [phone] 07/16/2014 6:46 PM

## 2014-07-16 NOTE — Progress Notes (Signed)
Patient Name: [name] Date of Encounter: 07/16/2014    Principal Problem:   Positive blood cultures Active Problems:   Aortic valve insufficiency   Hx of aortic aneurysm repair   Atrial fibrillation, do not know length of time   Gastric erosion   Acute respiratory failure, unspecified whether with hypoxia or hypercapnia   Anemia   Ventricular tachycardia   Essential hypertension   H/O aortic valve replacement   Acute renal insufficiency   AI (aortic insufficiency)   Urinary tract infection due to Enterococcus   Enterococcal bacteremia   Cardiac arrest   Aortic valve incompetence   H/O noncompliance with medical treatment, presenting hazards to health   DVT of upper extremity (deep vein thrombosis)   NSVT (nonsustained ventricular tachycardia)   Chronic atrial fibrillation   Acute on chronic renal failure   Hematemesis with nausea   Aortic valve vegetation   Dysphagia   Multisystem organ failure   Medically noncompliant   Severe aortic insufficiency   Bleeding gastrointestinal   Hypernatremia    SUBJECTIVE  Confused.  Disoriented to time.  No chest pain or sob.  CURRENT MEDS . ampicillin (OMNIPEN) IV  2 g Intravenous 6 times per day  . antiseptic oral rinse  7 mL Mouth Rinse q12n4p  . chlorhexidine  15 mL Mouth/Throat BID  . hydrALAZINE  7 mg Intravenous Q6H  . LORazepam  0.5 mg Intravenous Once  . metoprolol  7.5 mg Intravenous 3 times per day  . pantoprazole (PROTONIX) IV  40 mg Intravenous Q12H    OBJECTIVE  Filed Vitals:   07/16/14 0222 07/16/14 0339 07/16/14 0400 07/16/14 0742  BP: 148/87  154/61 139/116  Pulse:    77  Temp:  96.7 F (35.9 C)  96.7 F (35.9 C)  TempSrc:  Axillary  Axillary  Resp:    18  Height:      Weight:  233 lb 4 oz (105.8 kg)    SpO2:    100%    Intake/Output Summary (Last 24 hours) at 07/16/14 0757 Last data filed at 07/16/14 0600  Gross per 24 hour  Intake 1228.41 ml  Output    435 ml  Net 793.41 ml    Filed Weights   07/14/14 0800 07/15/14 0400 07/16/14 0339  Weight: 232 lb 5.8 oz (105.4 kg) 233 lb 4 oz (105.8 kg) 233 lb 4 oz (105.8 kg)    PHYSICAL EXAM  General: Pleasant, NAD. Neuro: Alert and oriented to person/place. Pulling @ hospital band on wrist. Moves all extremities spontaneously. Psych: Flat affect. HEENT:  Normal  Neck: Supple without bruits or JVD. Lungs:  Resp regular and unlabored, diminished breath sounds R base - poor effort.  CTA on left. Heart: IR, IR,  2/6 SEM, S2 click, no s3, s4. Abdomen: Soft, non-tender, non-distended, BS + x 4.  Extremities: No clubbing, cyanosis or edema. DP/PT/Radials 2+ and equal bilaterally.  Accessory Clinical Findings  CBC  Recent Labs  07/15/14 0428 07/16/14 0406  WBC 9.5 8.2  NEUTROABS 7.2 6.3  HGB 8.9* 8.8*  HCT 29.7* 29.2*  MCV 90.8 91.5  PLT 346 387   Basic Metabolic Panel  Recent Labs  07/14/14 0330 07/15/14 0428 07/16/14 0406  NA 148* 151*  --   K 3.5 3.6  --   CL 113* 116*  --   CO2 25 26  --   GLUCOSE 120* 118*  --   BUN 34* 36*  --   CREATININE 2.08* 2.41*  --  CALCIUM 9.3 9.7  --   MG 2.1 2.0 2.2   Liver Function Tests  Recent Labs  07/14/14 0330 07/15/14 0428  AST 26 32  ALT 16* 20  ALKPHOS 50 46  BILITOT 0.6 0.8  PROT 7.0 7.4  ALBUMIN 2.8* 2.9*   TELE  Afib, rate controlled.  PVC's with up to 10 beats of NSVT.  Radiology/Studies  Ct Head Wo Contrast  06/19/2014   CLINICAL DATA:  Patient with cognitive deficits and expressive aphasia.  EXAM: CT HEAD WITHOUT CONTRAST  TECHNIQUE: Contiguous axial images were obtained from the base of the skull through the vertex without intravenous contrast.  COMPARISON:  None.  FINDINGS: Ventricles and sulci are appropriate for patient's age. No evidence for acute cortically based infarct, intracranial hemorrhage, mass lesion or mass-effect. Orbits are unremarkable. Polypoid mucosal thickening bilateral maxillary sinuses. Frontal sinus and ethmoid  air cells are unremarkable. Mastoid air cells are well aerated. Calvarium is intact. Zygomatic arches are unremarkable.  IMPRESSION: No acute intracranial process.  Polypoid mucosal thickening paranasal sinuses.   Electronically Signed   By: Lovey Newcomer [name]   On: 06/19/2014 15:30   Dg Chest Port 1 View  07/14/2014   CLINICAL DATA:  70 year old male for evaluation of feeding tube placement. Initial encounter.  EXAM: PORTABLE CHEST - 1 VIEW  COMPARISON:  Abdominal radiograph 2314 hr today. Chest CTA 06/13/2014.  FINDINGS: The feeding tube tip projects just above the left hemidiaphragm along the left cardiophrenic angle (arrow). Abnormal mediastinal contour re- demonstrated and on 06/13/2014 a dissected an enlarged thoracic aorta was demonstrated. Lower lung volumes. Increased veiling opacity at the right lung base. No superimposed pneumothorax. Increased pulmonary vascularity without overt edema. Right side PICC line appears stable. Superimposed pacing or resuscitation pads and EKG leads.  IMPRESSION: 1. Feeding tube tip projects at the level of the distal thoracic esophagus just above the diaphragm. 2. Abnormal cardiac and mediastinal contours related to cardiomegaly and thoracic aortic dissection and aneurysmal enlargement as seen on 06/13/2014. 3. Increased right pleural effusion.   Electronically Signed   By: Genevie Ann [name]   On: 07/14/2014 00:10   Dg Abd Portable 1v  07/13/2014   ADDENDUM REPORT: 07/13/2014 23:38  ADDENDUM: CORRECTED IMPRESSION:  Normal NON obstructive bowel gas pattern. Interval removal of enteric  tube.   Electronically Signed   By: Lucienne Capers [name]   On: 07/13/2014 23:38   07/13/2014   CLINICAL DATA:  Abdominal pain tonight.  EXAM: PORTABLE ABDOMEN - 1 VIEW  COMPARISON:  07/12/2014  FINDINGS: The enteric tube appears to been removed since the previous study. Visualized bowel gas pattern is normal. Stool in the colon. No small or large bowel distention. No radiopaque stones. Surgical  clips in the abdomen and left pelvis. Vascular calcifications.  IMPRESSION: Normal on obstructive bowel gas pattern. Interval removal of enteric tube.  Electronically Signed: By: Lucienne Capers [name] On: 07/13/2014 23:23   ASSESSMENT AND PLAN  1. Aortic stenosis due to a thrombosed Ao Valve:  Pending high risk redo surgery once clinically improved and rehabbed.  2.  VT Arrest/NSVT:  Continues to have freq pvc's and had a run of 10 beats NSVT in past 24 hrs.  No report of presyncope/syncope.  Nl EF by echo 5/19.  Cont bb therapy.  No antiarrhythmics per EP.  3.  Acute on chronic diastolic heart failure:  Wt has been trending up ever since 5/28 - currently up 18 lbs since then.  CVP was 25  yesterday and RN just checked and it remains 25.  Will order IV lasix and KCl (3.6 yesterday).  HR stable.  BP mod elevated.  Follow with diuresis.  F/U creat this am (pending).  4. Enterococcal endocarditis:  Abx per IM/ID.  5. Dysphagia:  Remains NPO.  6. Atrial fib: Stable rate on IV lopressor.  On IV heparin.  7.  GIB: H/H stable.  8.  LUE DVT:  On heparin.  9.  Hypokalemia:  F/u.  10.  Acute on chronic stage III Kidney Dzs:  Follow with diuresis.  Signed, Murray Hodgkins NP

## 2014-07-16 NOTE — Progress Notes (Signed)
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 [name] who presented to [facility] on 06/11/2014 complaining of SOB. TEE showed severe AI with failed St Jude Mechanical valve after patient stopped his coumadin for unknown reason for 3 months and valve thrombosed. Also noted to be in a-fib. Evaluated by CT surgery for consideration of high risk AVR. Cath showed mild CAD however the operator could not visualize the distal RCA system. Valve surgery had to be delayed due to recurrent GI bleeding treated with EGDx2 and clipping. The pt was d/c to CIR on 6/3 to max out physical therapy before high risk AVR. In preparation for surgery he underwent dental extraction on 6/10.  He tolerated the procedure well, but the following day he suffered a VT arrest requiring CPR, epi, and intubation.  HPI/Subjective: The patient is alert calm and pleasant at the time of my visit.  He was agitated earlier today but this resolved when his family arrived.  He is currently able to tell me where he is and why he is here.  He denies chest pain shortness of breath nausea vomiting or abdominal pain.  Assessment/Plan:  In-hospital VT arrest/cardiac arrest -rapidly recovered after CPR initiated, intubated, and epi given (defib was not immediately available)  Thrombosed St. Jude aortic valve -due to noncompliance w/ warfarin anticoag  -CT Surgery now states patient is not a candidate for valve replacement (worsening renal failure, bacteremia, swallowing dysfunction requiring PEG tube, intermittent delirium, severe deconditioning, cardiac arrest with dental extraction) and likely will not improve to the point of being a candidate in the future  -Plan at this  point is for medical therapy alone  Noncompliant with Coumadin therapy for 3+ months -See thrombosed aortic valve  LUE DVT -Heparin per pharmacy - with surgery now being out of the picture, will plan to transition to warfarin once PEG placed    Episodic Uncontrolled HTN -blood pressure is quite variable with systolics as low as 998 and as high as 213 - this is likely reflective of his malfunctioning aortic valve - follow w/o change in tx today  Chronic atrial fibrillation -CHA2DS2-Vasc score of 3 but has 2 other very serious indications for ongoing anticog (thrombosed AVR, UE DVT) - continue heparin w/ plan to transition to warfarin as discussed above  -Rate controlled    Acute on chronic renal failure -Cr is now climbing - hydrate and follow  Severe pharyngeal phase dysphagia -due to weakness, secretions and decreased awareness and initiation - failed f/u SLP eval 6/18 - GI to place PEG in AM   Hematemesis -due to bleeding from teeth extraction - presently resolved with only pink saliva being spit out - follow  Enterococcal bacteremia -thought to only have been isolated from CVL even though 2 cultures were sent (both from CVL?) -empirically treating as a simple bacteremia w/ planned 2 weeks of antibiotics  -CVL was removed and PICC placed, apparently on same day  -f/u blood cx (6/13) no growth   Recent GI bleed -Hemoglobin stable  H/o Aortic dissection  Hematuria  Code Status: FULL Family Communication: no family present at time of exam Disposition Plan: SDU - eventual SNF   Consultants: Dr.[name] (PCCM) Dr.[name] (  cardiology) Dr.[name] (infectious disease) DDS [name] (dental) Dr.[name] (cardiothoracic surgery)  Procedure/Significant Events: 5/20 TEE EF 55% to 60% St. Jude Mechanical AVR is present. Only one of the leaflets visualized is moving; other leaflet is immobile -severe aortic insufficiency - Aorta: aortic  dissection flap in descending thoracic aorta that extends up to the arch - Mitral valve: moderate regurgitation - Left atrium: severely dilated. No thrombus in the atrial cavity or appendage - Right atrium: moderately dilated. Nothrombus in the atrial cavity or appendage -Tricuspid valve: moderate regurgitation.  5/24 upper extremity Doppler acute deep vein thrombosis involving axillary and brachial veins LUE -Superficial thrombosis in Basilic vein.  Antibiotics: Ampicillin 6/12 > Ceftriaxone 6/12 > 6/14  DVT prophylaxis: Heparin  Objective: Blood pressure 181/113, pulse 84, temperature 98.2 F (36.8 C), temperature source Oral, resp. rate 18, height 5\' 8"  (1.727 m), weight 105.8 kg (233 lb 4 oz), SpO2 100 %.  Intake/Output Summary (Last 24 hours) at 07/16/14 1358 Last data filed at 07/16/14 1100  Gross per 24 hour  Intake 1445.83 ml  Output    520 ml  Net 925.83 ml   Exam: General: No acute respiratory distress - alert/oriented/calm Lungs: Clear to auscultation bilaterally - no wheeze or crackles Cardiovascular: Irregularly irregular with metallic click without appreciable rub or gallop Abdomen: Nontender, nondistended, soft, bowel sounds positive, no rebound, no ascites, no mass Extremities: No significant cyanosis, clubbing, or edema bilateral lower extremities - 2+ stable edema left upper extremity with 1+ radial pulse  Data Reviewed: Basic Metabolic Panel:  Recent Labs Lab 07/12/14 0400 07/13/14 0340 07/14/14 0330 07/15/14 0428 07/16/14 0406 07/16/14 0800  NA 145 145 148* 151*  --  147*  K 3.6 3.5 3.5 3.6  --  3.7  CL 108 112* 113* 116*  --  112*  CO2 22 24 25 26   --  25  GLUCOSE 122* 171* 120* 118*  --  204*  BUN 22* 29* 34* 36*  --  45*  CREATININE 1.59* 1.87* 2.08* 2.41*  --  2.97*  CALCIUM 9.2 9.3 9.3 9.7  --  9.2  MG 1.8 1.9 2.1 2.0 2.2  --    Liver Function Tests:  Recent Labs Lab 07/11/14 0310 07/12/14 0400 07/13/14 0340 07/14/14 0330  07/15/14 0428  AST 21 21 25 26  32  ALT 10* 13* 16* 16* 20  ALKPHOS 46 44 45 50 46  BILITOT 1.0 0.8 0.8 0.6 0.8  PROT 6.9 7.0 7.4 7.0 7.4  ALBUMIN 2.6* 2.7* 2.9* 2.8* 2.9*   CBC:  Recent Labs Lab 07/12/14 0400 07/13/14 0340 07/14/14 0330 07/15/14 0428 07/16/14 0406  WBC 7.4 7.9 10.3 9.5 8.2  NEUTROABS 4.8 5.9 7.4 7.2 6.3  HGB 8.9* 9.3* 8.9* 8.9* 8.8*  HCT 28.5* 29.8* 29.4* 29.7* 29.2*  MCV 86.6 89.0 88.3 90.8 91.5  PLT 310 345 348 346 339    Recent Results (from the past 240 hour(s))  Culture, blood (routine x 2)     Status: None   Collection Time: 07/06/14  4:20 PM  Result Value Ref Range Status   Specimen Description BLOOD CENTRAL LINE  Final   Special Requests BOTTLES DRAWN AEROBIC ONLY 10CC  Final   Culture   Final    ENTEROCOCCUS SPECIES Note: SUSCEPTIBILITIES PERFORMED ON PREVIOUS CULTURE WITHIN THE LAST 5 DAYS. Note: Gram Stain Report Called to,Read Back By and Verified With: A AFATSAWO 07/07/14 1055 BY SMITHERSJ Performed at [facility]    Report Status 07/09/2014  FINAL  Final  Culture, blood (routine x 2)     Status: None   Collection Time: 07/06/14  4:25 PM  Result Value Ref Range Status   Specimen Description BLOOD CENTRAL LINE  Final   Special Requests BOTTLES DRAWN AEROBIC ONLY 10CC  Final   Culture   Final    ENTEROCOCCUS SPECIES Note: Gram Stain Report Called to,Read Back By and Verified With: A AFATSAWO 07/07/14 1055 BY SMITHERSJ Performed at [facility]    Report Status 07/09/2014 FINAL  Final   Organism ID, Bacteria ENTEROCOCCUS SPECIES  Final      Susceptibility   Enterococcus species - MIC*    AMPICILLIN <=2 SENSITIVE Sensitive     VANCOMYCIN 2 SENSITIVE Sensitive     * ENTEROCOCCUS SPECIES  Culture, blood (routine x 2)     Status: None   Collection Time: 07/09/14  4:20 PM  Result Value Ref Range Status   Specimen Description BLOOD LEFT FOOT  Final   Special Requests BOTTLES DRAWN AEROBIC ONLY 10CC  Final   Culture    Final    NO GROWTH 5 DAYS Performed at [facility]    Report Status 07/15/2014 FINAL  Final  Culture, blood (routine x 2)     Status: None   Collection Time: 07/09/14  4:30 PM  Result Value Ref Range Status   Specimen Description BLOOD RIGHT FOOT  Final   Special Requests BOTTLES DRAWN AEROBIC ONLY 3CC  Final   Culture   Final    NO GROWTH 5 DAYS Performed at [facility]    Report Status 07/15/2014 FINAL  Final     Studies:  Recent x-ray studies have been reviewed in detail by the Attending Physician  Scheduled Meds:  Scheduled Meds: . ampicillin (OMNIPEN) IV  2 g Intravenous Q8H  . antiseptic oral rinse  7 mL Mouth Rinse q12n4p  . [START ON 07/16/2014]  ceFAZolin (ANCEF) IV  2 g Intravenous To OR  . chlorhexidine  15 mL Mouth/Throat BID  . furosemide  40 mg Intravenous Daily  . hydrALAZINE  7 mg Intravenous Q6H  . metoprolol  7.5 mg Intravenous 3 times per day  . pantoprazole (PROTONIX) IV  40 mg Intravenous Q12H  . potassium chloride  10 mEq Intravenous BID    Time spent on care of this patient: 35 mins  Cherene Altes, MD [facility] For Consults/Admissions - Flow Manager - [phone] Office  [phone]  Contact MD directly via text page:      [email or web address]      password [facility]  07/16/2014, 1:58 PM   LOS: 11 days

## 2014-07-17 ENCOUNTER — Other Ambulatory Visit: Payer: Self-pay

## 2014-07-17 ENCOUNTER — Encounter (HOSPITAL_COMMUNITY): Payer: Self-pay

## 2014-07-17 ENCOUNTER — Encounter (HOSPITAL_COMMUNITY): Payer: Self-pay | Admitting: Certified Registered Nurse Anesthetist

## 2014-07-17 ENCOUNTER — Inpatient Hospital Stay (HOSPITAL_COMMUNITY): Payer: Commercial Managed Care - HMO

## 2014-07-17 ENCOUNTER — Encounter (HOSPITAL_COMMUNITY): Admission: RE | Disposition: E | Payer: Self-pay | Source: Ambulatory Visit | Attending: Internal Medicine

## 2014-07-17 DIAGNOSIS — N179 Acute kidney failure, unspecified: Secondary | ICD-10-CM

## 2014-07-17 DIAGNOSIS — N189 Chronic kidney disease, unspecified: Secondary | ICD-10-CM

## 2014-07-17 DIAGNOSIS — D689 Coagulation defect, unspecified: Secondary | ICD-10-CM | POA: Diagnosis present

## 2014-07-17 DIAGNOSIS — R319 Hematuria, unspecified: Secondary | ICD-10-CM | POA: Diagnosis present

## 2014-07-17 DIAGNOSIS — I469 Cardiac arrest, cause unspecified: Secondary | ICD-10-CM | POA: Diagnosis present

## 2014-07-17 DIAGNOSIS — I7100 Dissection of unspecified site of aorta: Secondary | ICD-10-CM | POA: Diagnosis present

## 2014-07-17 DIAGNOSIS — J9601 Acute respiratory failure with hypoxia: Secondary | ICD-10-CM | POA: Diagnosis present

## 2014-07-17 DIAGNOSIS — Z515 Encounter for palliative care: Secondary | ICD-10-CM | POA: Diagnosis present

## 2014-07-17 LAB — HEPATIC FUNCTION PANEL
ALT: 25 U/L (ref 17–63)
AST: 35 U/L (ref 15–41)
Albumin: 3 g/dL — ABNORMAL LOW (ref 3.5–5.0)
Alkaline Phosphatase: 44 U/L (ref 38–126)
BILIRUBIN DIRECT: 0.4 mg/dL (ref 0.1–0.5)
BILIRUBIN INDIRECT: 0.7 mg/dL (ref 0.3–0.9)
Total Bilirubin: 1.1 mg/dL (ref 0.3–1.2)
Total Protein: 7.7 g/dL (ref 6.5–8.1)

## 2014-07-17 LAB — CBC WITH DIFFERENTIAL/PLATELET
BASOS PCT: 0 % (ref 0–1)
Basophils Absolute: 0 10*3/uL (ref 0.0–0.1)
EOS ABS: 0.1 10*3/uL (ref 0.0–0.7)
EOS PCT: 1 % (ref 0–5)
HEMATOCRIT: 28.7 % — AB (ref 39.0–52.0)
Hemoglobin: 8.7 g/dL — ABNORMAL LOW (ref 13.0–17.0)
Lymphocytes Relative: 12 % (ref 12–46)
Lymphs Abs: 1.1 10*3/uL (ref 0.7–4.0)
MCH: 27.2 pg (ref 26.0–34.0)
MCHC: 30.3 g/dL (ref 30.0–36.0)
MCV: 89.7 fL (ref 78.0–100.0)
MONO ABS: 1.4 10*3/uL — AB (ref 0.1–1.0)
Monocytes Relative: 16 % — ABNORMAL HIGH (ref 3–12)
NEUTROS ABS: 6.2 10*3/uL (ref 1.7–7.7)
Neutrophils Relative %: 71 % (ref 43–77)
PLATELETS: 330 10*3/uL (ref 150–400)
RBC: 3.2 MIL/uL — AB (ref 4.22–5.81)
RDW: 20.2 % — ABNORMAL HIGH (ref 11.5–15.5)
WBC: 8.8 10*3/uL (ref 4.0–10.5)

## 2014-07-17 LAB — POCT I-STAT 3, ART BLOOD GAS (G3+)
ACID-BASE DEFICIT: 2 mmol/L (ref 0.0–2.0)
Acid-base deficit: 4 mmol/L — ABNORMAL HIGH (ref 0.0–2.0)
BICARBONATE: 23.6 meq/L (ref 20.0–24.0)
Bicarbonate: 23.4 mEq/L (ref 20.0–24.0)
O2 SAT: 93 %
O2 SAT: 92 %
PCO2 ART: 56.2 mmHg — AB (ref 35.0–45.0)
PH ART: 7.228 — AB (ref 7.350–7.450)
Patient temperature: 98.6
TCO2: 25 mmol/L (ref 0–100)
TCO2: 25 mmol/L (ref 0–100)
pCO2 arterial: 41.4 mmHg (ref 35.0–45.0)
pH, Arterial: 7.363 (ref 7.350–7.450)
pO2, Arterial: 81 mmHg (ref 80.0–100.0)
pO2, Arterial: 66 mmHg — ABNORMAL LOW (ref 80.0–100.0)

## 2014-07-17 LAB — BASIC METABOLIC PANEL
Anion gap: 10 (ref 5–15)
BUN: 50 mg/dL — ABNORMAL HIGH (ref 6–20)
CO2: 26 mmol/L (ref 22–32)
Calcium: 9.3 mg/dL (ref 8.9–10.3)
Chloride: 113 mmol/L — ABNORMAL HIGH (ref 101–111)
Creatinine, Ser: 3.56 mg/dL — ABNORMAL HIGH (ref 0.61–1.24)
GFR, EST AFRICAN AMERICAN: 19 mL/min — AB (ref >60)
GFR, EST NON AFRICAN AMERICAN: 16 mL/min — AB (ref >60)
Glucose, Bld: 125 mg/dL — ABNORMAL HIGH (ref 65–99)
POTASSIUM: 3.8 mmol/L (ref 3.5–5.1)
SODIUM: 149 mmol/L — AB (ref 135–145)

## 2014-07-17 LAB — CBC
HEMATOCRIT: 29.6 % — AB (ref 39.0–52.0)
Hemoglobin: 8.9 g/dL — ABNORMAL LOW (ref 13.0–17.0)
MCH: 26.9 pg (ref 26.0–34.0)
MCHC: 30.1 g/dL (ref 30.0–36.0)
MCV: 89.4 fL (ref 78.0–100.0)
Platelets: 326 10*3/uL (ref 150–400)
RBC: 3.31 MIL/uL — ABNORMAL LOW (ref 4.22–5.81)
RDW: 21.7 % — ABNORMAL HIGH (ref 11.5–15.5)
WBC: 13.2 10*3/uL — AB (ref 4.0–10.5)

## 2014-07-17 LAB — TROPONIN I: TROPONIN I: 0.07 ng/mL — AB (ref <0.031)

## 2014-07-17 LAB — COMPREHENSIVE METABOLIC PANEL
ALBUMIN: 3 g/dL — AB (ref 3.5–5.0)
ALT: 26 U/L (ref 17–63)
AST: 39 U/L (ref 15–41)
Alkaline Phosphatase: 45 U/L (ref 38–126)
Anion gap: 16 — ABNORMAL HIGH (ref 5–15)
BUN: 54 mg/dL — ABNORMAL HIGH (ref 6–20)
CHLORIDE: 112 mmol/L — AB (ref 101–111)
CO2: 21 mmol/L — AB (ref 22–32)
Calcium: 9 mg/dL (ref 8.9–10.3)
Creatinine, Ser: 3.87 mg/dL — ABNORMAL HIGH (ref 0.61–1.24)
GFR calc Af Amer: 17 mL/min — ABNORMAL LOW (ref >60)
GFR calc non Af Amer: 15 mL/min — ABNORMAL LOW (ref >60)
Glucose, Bld: 137 mg/dL — ABNORMAL HIGH (ref 65–99)
POTASSIUM: 4 mmol/L (ref 3.5–5.1)
SODIUM: 149 mmol/L — AB (ref 135–145)
Total Bilirubin: 1 mg/dL (ref 0.3–1.2)
Total Protein: 7.2 g/dL (ref 6.5–8.1)

## 2014-07-17 LAB — PROTIME-INR
INR: 2.11 — AB (ref 0.00–1.49)
INR: 1.99 — ABNORMAL HIGH (ref 0.00–1.49)
PROTHROMBIN TIME: 23.5 s — AB (ref 11.6–15.2)
PROTHROMBIN TIME: 22.5 s — AB (ref 11.6–15.2)

## 2014-07-17 LAB — LACTIC ACID, PLASMA: LACTIC ACID, VENOUS: 4.3 mmol/L — AB (ref 0.5–2.0)

## 2014-07-17 LAB — MAGNESIUM: MAGNESIUM: 2.5 mg/dL — AB (ref 1.7–2.4)

## 2014-07-17 SURGERY — CANCELLED PROCEDURE

## 2014-07-17 MED ORDER — FENTANYL CITRATE (PF) 100 MCG/2ML IJ SOLN
50.0000 ug | INTRAMUSCULAR | Status: DC | PRN
  Administered 2014-07-18 – 2014-07-20 (×8): 50 ug via INTRAVENOUS
  Filled 2014-07-17 (×7): qty 2

## 2014-07-17 MED ORDER — DEXTROSE 5 % IV SOLN: INTRAVENOUS | Status: DC

## 2014-07-17 MED ORDER — METOPROLOL TARTRATE 1 MG/ML IV SOLN: 2.5000 mg | INTRAVENOUS | Status: DC | PRN

## 2014-07-17 MED ORDER — FENTANYL CITRATE (PF) 100 MCG/2ML IJ SOLN: 100.0000 ug | Freq: Once | INTRAMUSCULAR | Status: DC

## 2014-07-17 MED ORDER — SODIUM BICARBONATE 8.4 % IV SOLN
100.0000 meq | Freq: Once | INTRAVENOUS | Status: DC
  Filled 2014-07-17: qty 100

## 2014-07-17 MED ORDER — HEPARIN (PORCINE) IN NACL 100-0.45 UNIT/ML-% IJ SOLN
1000.0000 [IU]/h | INTRAMUSCULAR | Status: DC
  Administered 2014-07-17: 1000 [IU]/h via INTRAVENOUS

## 2014-07-17 MED ORDER — FAMOTIDINE IN NACL 20-0.9 MG/50ML-% IV SOLN
20.0000 mg | Freq: Two times a day (BID) | INTRAVENOUS | Status: DC
  Filled 2014-07-17: qty 50

## 2014-07-17 MED ORDER — HYDRALAZINE HCL 20 MG/ML IJ SOLN
10.0000 mg | INTRAMUSCULAR | Status: DC | PRN
  Administered 2014-07-20: 40 mg via INTRAVENOUS
  Filled 2014-07-17: qty 2

## 2014-07-17 MED ORDER — DOPAMINE-DEXTROSE 3.2-5 MG/ML-% IV SOLN
0.0000 ug/kg/min | INTRAVENOUS | Status: DC
  Administered 2014-07-17: 5 ug/kg/min via INTRAVENOUS

## 2014-07-17 MED ORDER — VANCOMYCIN HCL 10 G IV SOLR
2000.0000 mg | Freq: Once | INTRAVENOUS | Status: AC
  Administered 2014-07-18: 2000 mg via INTRAVENOUS
  Filled 2014-07-17: qty 2000

## 2014-07-17 MED ORDER — MIDAZOLAM HCL 2 MG/2ML IJ SOLN
INTRAMUSCULAR | Status: AC
  Filled 2014-07-17: qty 2

## 2014-07-17 MED ORDER — VITAMIN K1 10 MG/ML IJ SOLN
10.0000 mg | Freq: Every day | INTRAMUSCULAR | Status: DC
  Administered 2014-07-17: 10 mg via SUBCUTANEOUS
  Filled 2014-07-17: qty 1

## 2014-07-17 MED ORDER — FENTANYL CITRATE (PF) 100 MCG/2ML IJ SOLN
50.0000 ug | INTRAMUSCULAR | Status: DC | PRN
  Administered 2014-07-17 (×2): 50 ug via INTRAVENOUS
  Filled 2014-07-17 (×4): qty 2

## 2014-07-17 MED ORDER — VASOPRESSIN 20 UNIT/ML IV SOLN
0.0300 [IU]/min | INTRAVENOUS | Status: DC
  Administered 2014-07-17: 0.03 [IU]/min via INTRAVENOUS
  Filled 2014-07-17: qty 2

## 2014-07-17 MED ORDER — OSMOLITE 1.2 CAL PO LIQD
1000.0000 mL | ORAL | Status: DC
  Filled 2014-07-17 (×3): qty 1000

## 2014-07-17 MED ORDER — PIPERACILLIN-TAZOBACTAM 3.375 G IVPB
3.3750 g | Freq: Three times a day (TID) | INTRAVENOUS | Status: DC
  Administered 2014-07-17 – 2014-07-18 (×2): 3.375 g via INTRAVENOUS
  Filled 2014-07-17 (×4): qty 50

## 2014-07-17 MED ORDER — DOPAMINE-DEXTROSE 3.2-5 MG/ML-% IV SOLN
0.0000 ug/kg/min | INTRAVENOUS | Status: DC
  Administered 2014-07-17: 20 ug/kg/min via INTRAVENOUS
  Administered 2014-07-17: 10 ug/kg/min via INTRAVENOUS

## 2014-07-17 MED ORDER — DOPAMINE-DEXTROSE 3.2-5 MG/ML-% IV SOLN: 10.0000 ug/kg/min | INTRAVENOUS | Status: DC

## 2014-07-17 MED ORDER — DEXTROSE 5 % IV SOLN
INTRAVENOUS | Status: DC
  Administered 2014-07-17 – 2014-07-20 (×6): via INTRAVENOUS
  Filled 2014-07-17 (×12): qty 150

## 2014-07-17 MED ORDER — FENTANYL CITRATE (PF) 100 MCG/2ML IJ SOLN
INTRAMUSCULAR | Status: AC
  Administered 2014-07-17: 100 ug
  Filled 2014-07-17: qty 2

## 2014-07-17 MED ORDER — SODIUM BICARBONATE 8.4 % IV SOLN
50.0000 meq | Freq: Once | INTRAVENOUS | Status: DC
  Filled 2014-07-17: qty 50

## 2014-07-17 MED ORDER — MAGNESIUM SULFATE 2 GM/50ML IV SOLN: 2.0000 g | Freq: Once | INTRAVENOUS | Status: DC

## 2014-07-17 MED ORDER — KETAMINE HCL 100 MG/ML IJ SOLN
INTRAMUSCULAR | Status: AC
Start: 2014-07-17 — End: 2014-07-17
  Filled 2014-07-17: qty 1

## 2014-07-17 NOTE — Progress Notes (Signed)
eLink Physician-Brief Progress Note Patient Name: [name] DOB: 1945/02/26 MRN: [id number]   Date of Service  07/05/2014  HPI/Events of Note  Order clarification  eICU Interventions  Hold D5 Continue bicarb gtt for now     Intervention Category Minor Interventions: Routine modifications to care plan (e.g. PRN medications for pain, fever)  Yamileth Hayse 07/20/2014, 9:18 PM

## 2014-07-17 NOTE — Progress Notes (Signed)
Patient had a bradycardic cardiac arrest.  He received CPR for 8-12 minutes.  He had periods of pulseless electrical activity.  Initially severely hypotensive.  Now blood pressure is elevated and his pressors are being reduced.  Postarrest EKG taken at 1802 shows atrial fibrillation with rapid ventricular response.  Voltage remains low.  EKG does not show any new acute ischemic changes.  Physical examination reveals sharp mechanical aortic closure sound. Overall condition and prognosis remains extremely guarded.

## 2014-07-17 NOTE — Progress Notes (Signed)
ANTICOAGULATION CONSULT NOTE - Initial Consult  Pharmacy Consult for Heparin re-start Indication: DVT, afib, mechanical AV thrombosis   No Known Allergies  Patient Measurements: Height: 5' 9.5" (176.5 cm) Weight: 232 lb (105.235 kg) IBW/kg (Calculated) : 71.85  Vital Signs: Temp: 97.3 F (36.3 C) (06/21 2100) Temp Source: Axillary (06/21 2100) BP: 106/49 mmHg (06/21 2300) Pulse Rate: 72 (06/21 2300)  Labs:  Recent Labs  07/15/14 2000 07/16/14 0406 07/16/14 0800 07/16/14 1810 07/24/2014 0540 06/30/2014 0730 07/19/2014 1357 07/19/2014 1700  HGB  --  8.8*  --   --  8.7*  --   --  8.9*  HCT  --  29.2*  --   --  28.7*  --   --  29.6*  PLT  --  339  --   --  330  --   --  326  LABPROT  --   --   --   --  23.5*  --  22.5*  --   INR  --   --   --   --  2.11*  --  1.99*  --   HEPARINUNFRC 0.70 0.38  --  0.19*  --   --   --   --   CREATININE  --   --  2.97*  --  3.56* 3.87*  --   --   TROPONINI  --   --   --   --   --   --   --  0.07*    Estimated Creatinine Clearance: 22.3 mL/min (by C-G formula based on Cr of 3.87).   Medical History: Past Medical History  Diagnosis Date  . Chest pain   . Hypertension   . Aortic aneurysm 1993  . 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 Pneumonia   . Aortic valve replaced      05/2014 transfered from [place] to [facility] for HF in the setting of failed valve repair. 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 CT surgery considering AVR, delayed due to GI bleed  . Hx of aortic aneurysm repair 06/14/2014  . Atrial fibrillation 05/2014  . Gastritis and gastroduodenitis with hemorrhage 05/2014    severe.   . Tubular adenoma of colon 12/2012  . DVT of upper extremity (deep vein thrombosis) 05/2014    left  . Acute blood loss anemia 05/2014   . CAD (coronary artery disease)     minimal CAD on cath 05/2014    Assessment: Heparin re-start s/p cardiac arrest, RN verified with CCM to re-start if no bleeding issues, Hgb  stable, other labs as above.   Goal of Therapy:  Heparin level 0.3-0.5 units/ml Monitor platelets by anticoagulation protocol: Yes   Plan:  -Re-start heparin at 1000 units/hr -0800 HL -Daily CBC/HL -Monitor for bleeding  [place], Beem 07/16/2014,11:43 PM

## 2014-07-17 NOTE — Progress Notes (Signed)
   Lab Results  Component Value Date   INR 2.11* 06/30/2014   INR 1.37 06/21/2014   INR 1.67* 06/15/2014   INR too high for PEG.  Will need correction.  Could not reach daughter by phone.  Will follow-up tomorrow.  Have ordered vit K   Gatha Mayer, MD, [facility] [phone] (pager) 07/01/2014 10:14 AM

## 2014-07-17 NOTE — Progress Notes (Signed)
Daily Progress Note   Patient Name: [name]       Date: 07/20/2014 DOB: [birth year]  Age: 69 y.o. MRN#: [id number] Attending Physician: Allie Bossier, MD Primary Care Physician: [name], MD Admit Date: 06/28/2014   This is a 69 y.o. male with PMH significant for A fib, History of aortic aneurysm repair, Aortic valve insufficiency, St Jude mechanical Valve with malfunction, last hospitalization 06/13/2014-06/29/2014 for cardiogenic shock from malfunctioning valve, GI bleed from gastric cardiac erosion. He was discharged to [facility] for Rehab and readmitted to dental work back on 07/12/2014. On 6/09 he underwent Multiple extraction of tooth and other dental procedures in preparation of AVR. Unfortunately, on 07/06/2014 he developed ventricular tachycardia and arrested requiring CPR for 2 minutes. He remains full code. Plan to have AVR redo by Dr Nils Pyle at the end of June. Patient has been receiving heparin Gtt for Mechanical Valve. His hospitalization has been complicated with multiple medical problems including acute renal failure, urinary tract infection, enterococcal bacteremia, atrial fibrillation with ventricular tachycardia, acute respiratory failure with hypoxia, DVT to the left upper extremity, CHF, delirium (pulling out lines) and continuing concern for GI bleed. Main family/care takers daughter, Erin Fulling and son Kendrid.  PEG unable to be placed due to high INR (more than 2). Patient remains encephalopathic, not able to engage in discussions regarding his goals of care. Dr. Linda Hedges arranging family meeting with son and daughter for 07-18-14. Patient's renal function is worsening.   Reason for Consultation/Follow-up: Establishing goals of care  Subjective:  resting in bed, appears chronically and acutely ill, opens eyes to name being called but does not verbalize.  Interval Events: Dr. Linda Hedges consult note reviewed, anticipate family meeting within 24 hours.   Length of Stay: 12  days  Current Medications: Scheduled Meds:  . ampicillin (OMNIPEN) IV  2 g Intravenous Q8H  . antiseptic oral rinse  7 mL Mouth Rinse q12n4p  .  ceFAZolin (ANCEF) IV  2 g Intravenous To Endo  . chlorhexidine  15 mL Mouth/Throat BID  . hydrALAZINE  7 mg Intravenous Q6H  . metoprolol  7.5 mg Intravenous 3 times per day  . pantoprazole (PROTONIX) IV  40 mg Intravenous Q12H  . phytonadione  10 mg Subcutaneous Daily  . potassium chloride  10 mEq Intravenous BID    Continuous Infusions: . dextrose 60 mL (07/05/2014 1111)  . feeding supplement (OSMOLITE 1.2 CAL)    . heparin 1,000 Units/hr (06/29/2014 1524)    PRN Meds: albuterol, aminocaproic acid, hydrALAZINE, HYDROmorphone (DILAUDID) injection, LORazepam, [DISCONTINUED] ondansetron **OR** ondansetron (ZOFRAN) IV  Palliative Performance Scale: 20%     Vital Signs: BP 168/89 mmHg  Pulse 84  Temp(Src) 97.2 F (36.2 C) (Oral)  Resp 15  Ht 5\' 8"  (1.727 m)  Wt 105.235 kg (232 lb)  BMI 35.28 kg/m2  SpO2 94% SpO2: SpO2: 94 % O2 Device: O2 Device: Nasal Cannula O2 Flow Rate: O2 Flow Rate (L/min): 2 L/min  Intake/output summary:  Intake/Output Summary (Last 24 hours) at 07/20/2014 1549 Last data filed at 07/21/2014 1200  Gross per 24 hour  Intake 1247.94 ml  Output    560 ml  Net 687.94 ml   LBM:   Baseline Weight: Weight: 108.9 kg (240 lb 1.3 oz) Most recent weight: Weight: 105.235 kg (232 lb)  Physical Exam: Encephalopathic, opens eyes to voice command but does not verbalize           Additional Data Reviewed: Recent Labs  07/16/14  0406  07/16/14  0800  06/28/2014  0540  WBC  8.2   --   8.8  HGB  8.8*   --   8.7*  PLT  339   --   330  NA   --   147*  149*  BUN   --   45*  50*  CREATININE   --   2.97*  3.56*     Problem List:  Patient Active Problem List   Diagnosis Date Noted  . Coagulopathy   . Protein-calorie malnutrition, severe   . Feeding difficulties   . Multisystem organ failure   . Medically  noncompliant   . Severe aortic insufficiency   . Bleeding gastrointestinal   . Hypernatremia   . Dysphagia   . Aortic valve vegetation   . Urinary tract infection due to Enterococcus 07/10/2014  . Enterococcal bacteremia   . Cardiac arrest   . Aortic valve incompetence   . H/O noncompliance with medical treatment, presenting hazards to health   . DVT of upper extremity (deep vein thrombosis)   . NSVT (nonsustained ventricular tachycardia)   . Chronic atrial fibrillation   . Acute on chronic renal failure   . Hematemesis with nausea   . AI (aortic insufficiency)   . H/O aortic valve replacement 07/08/2014  . Positive blood cultures 07/08/2014  . Acute renal insufficiency 07/08/2014  . Essential hypertension   . Ventricular tachycardia 07/06/2014  . VT (ventricular tachycardia) 07/06/2014  . Chronic periodontitis 07/24/2014  . Anemia 07/04/2014  . 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 Debility 06/29/2014  . Primary gout   . Gastric erosion with bleeding   . Acute respiratory failure, unspecified whether with hypoxia or hypercapnia   . Altered mental status   . Accelerated hypertension   . Aortic insufficiency   . Aortic stenosis   . Melena   . Gastric erosion   . Aortic valve insufficiency 06/14/2014  . Cardiogenic shock, possible 06/14/2014  . Hx of aortic aneurysm repair 06/14/2014  . Atrial fibrillation, do not know length of time 06/14/2014  . Elevated troponin 06/14/2014  . Positive D dimer 06/14/2014  . CHF exacerbation   . Encounter for screening colonoscopy 11/30/2012  . Duodenal ulcer 11/30/2012  . Aortic valve disorders 01/13/2010  . 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 OTHER CHEST PAIN 01/13/2010  . OTHER ACQUIRED ABSENCE OF ORGAN 01/13/2010     Palliative Care Assessment & Plan    Code Status:  Full code  Goals of Care:  Continue current mode of care, family meeting soon.   Desire for further Chaplaincy support:no  3. Symptom  Management:  Patient is on low dose PRN IV Dilaudid, also on ativan    4. Palliative Prophylaxis:  Stool Softener: no. Monitor patient.   5. Prognosis:  possibly days to weeks given patient's current status  5. Discharge Planning: pending clinical course.    Care plan was discussed with patient's RN  Thank you for allowing the Palliative Medicine Team to assist in the care of this patient.   Time In: 1430 Time Out: 1455 Total Time 25 Prolonged Time Billed  no     Greater than 50%  of this time was spent counseling and coordinating care related to the above assessment and plan.   Loistine Chance, MD  07/17/2014, 3:49 PM  Please contact Palliative Medicine Team phone at [phone] for questions and concerns.

## 2014-07-17 NOTE — Progress Notes (Signed)
[place] for Heparin  Indication: DVT (also mechanical AV thrombosis & Afib)  No Known Allergies  Patient Measurements: Height: 5\' 8"  (172.7 cm) Weight: 232 lb (105.235 kg) IBW/kg (Calculated) : 68.4 Heparin dosing weight = 91.6 kg  Vital Signs: Temp: 97.2 F (36.2 C) (06/21 1113) Temp Source: Oral (06/21 1113) BP: 183/94 mmHg (06/21 1113) Pulse Rate: 85 (06/21 1113)  Labs:  Recent Labs  07/15/14 0428  07/15/14 2000 07/16/14 0406 07/16/14 0800 07/16/14 1810 07/16/2014 0540 06/29/2014 1357  HGB 8.9*  --   --  8.8*  --   --  8.7*  --   HCT 29.7*  --   --  29.2*  --   --  28.7*  --   PLT 346  --   --  339  --   --  330  --   LABPROT  --   --   --   --   --   --  23.5* 22.5*  INR  --   --   --   --   --   --  2.11* 1.99*  HEPARINUNFRC  --   < > 0.70 0.38  --  0.19*  --   --   CREATININE 2.41*  --   --   --  2.97*  --  3.56*  --   < > = values in this interval not displayed.  Estimated Creatinine Clearance: 23.7 mL/min (by C-G formula based on Cr of 3.56).   Assessment: 69 yo male with Afib/AVR, s/p tooth extraction last week continues on IV heparin for anticoagulation. Heparin was held this AM in anticipation of PEG placement. However, his INR was elevated at 2.11. This was rechecked rule out lab errors and it remained elevated at 1.99. Due to new thrombosis of valve, will go ahead and resume heparin gtt without a bolus per discussion with cardiology. Vitamin K has been ordered to help reverse INR. It is unclear what is causing the elevated INR since pt has not received any coumadin and LFTs are WNL.   Goal of Therapy:  Heparin level 0.3-0.5 Monitor platelets by anticoagulation protocol: Yes   Plan:  - Restart heparin gtt at 1000 units/hr (level never checked at this rate) - Check an 8 hour heparin level - Continue daily HL and CBC - F/u PEG placement plans  Salome Arnt, PharmD, BCPS Pager # [phone] 07/02/2014 2:39 PM

## 2014-07-17 NOTE — Procedures (Signed)
Central Venous Catheter Insertion Procedure Note POLK MINOR [id number] 01/05/1946  Procedure: Insertion of Central Venous Catheter Indications: Drug and/or fluid administration  Procedure Details Consent: Risks of procedure as well as the alternatives and risks of each were explained to the (patient/caregiver).  Consent for procedure obtained. Time Out: Verified patient identification, verified procedure, site/side was marked, verified correct patient position, special equipment/implants available, medications/allergies/relevent history reviewed, required imaging and test results available.  Performed  Maximum sterile technique was used including antiseptics, cap, gloves, gown, hand hygiene, mask and sheet. Skin prep: Chlorhexidine; local anesthetic administered A antimicrobial bonded/coated triple lumen catheter was placed in the right subclavian vein using the Seldinger technique.  Evaluation Blood flow good Complications: No apparent complications Patient did tolerate procedure well. Chest X-ray ordered to verify placement.  CXR: pending.  Zayon Trulson V. 07/01/2014, 6:37 PM

## 2014-07-17 NOTE — Progress Notes (Signed)
[place] TEAM 1 - Stepdown/ICU TEAM Progress Note  [name] GYK:[id number] DOB: January 29, 1945 DOA: 07/21/2014 PCP: [name], MD  Admit HPI / Brief Narrative: 69 yo male BM PMHx aortic root replacement with 22m St Jude Mechanical AVR in the setting of type A aortic dissection and AI in 1993 by Dr. CMerleen Nicely   [facility] complaining of SOB. 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 Also noted to be in a-fib. Evaluated by CT surgery for consideration of high risk AVR. Cath showed mild CAD however cannot visually distal RCA system. Surgery delayed due to recurrent GI bleed treated with EGDx2 and clipping. I discharged the patient to CIR on 6/3 to max out physical therapy before high risk AVR. Underwent dental extraction on 6/10, post op, had VT arrest, s/p CPR and intubation. ID consulted after blood cx came back with enteroccocal infection (likely contamination from central line)  HPI/Subjective: 6/ 21 as I entered room patient went into PEA, CODE BLUE was called and CPR was begun. CPR lasted from~1645-1730. During this time patient's actual total time in arrest was~15 minutes. Initially regained Return of spontaneous circulation (ROSC), then patient went into PEA again with subsequent ROSC    Assessment/Plan: Multisystem organ failure -Secondary to below, have placed palliative consult  VT arrest/cardiac arrest/PEA -See above HPI. Patient now intubated  Thrombosed aortic valve/incompetence St. Jude aortic valve/Severe Aortic Insufficency -Although [facility], unable to substantiate this statement. Review of findings from 5/24 cardiac catheterization does not mention thrombosed aortic valve. -AVR dysfunction secondary to one of the leaflets  being immobile see TEE results 5/20 below -CT surgery states patient is not currently a candidate for placement until  completed 2 weeks antibiotics and a course of physical therapy at CIR -6/19 CVP= 25 : Elevated CVP expected with patient's AV insufficiency, unfortunately cannot diuresis secondary to patient being preload dependent -Not accepted to CIR;-Bid out to SNF  Medically Noncompliant with Coumadin therapy for 3+ months -See thrombosed aortic valve  LUE DVT -Heparin per pharmacy  NSVT/HTN -patient intubated and on pressors secondary to PEA  Chronic atrial fibrillation:  - CHA2DS2-Vasc score of 3, continue heparin drip per pharmacy -Rate controlled with IV metoprolol -See HTN   Acute on chronic renal failure -Cr trending up today Pt appears to be Hypovolemic, however patient's urine output decreasing most likely secondary to poor perfusion secondary to severe AV insufficiency. -Will try to maintain patient euvolemic; I/O since Admission + 6. 2 L  -[facility] with continuing worsening [facility] function [facility]  Severe pharyngeal phase dysphagia - nutrition is key in preparing patient for eventual life-saving valve surgery, continue NG tube feeds  -Failed swallow study 6/15 Speech reevaluated patient on 6/18; failed MBS  -Requested IR place PEG tube however they refused secondary to patient having GI bleeding last month with epinephrine injection and 4 times clips placed. SNF will not except patient with NG tube. -Contacted  GI and discussed case and need for PEG tube. Dr. JArdis Hughswill evaluate for PEG tube placement. -Dr. JArdis Hughsto discuss with partners if feasible to place PEG -PEG delayed secondary to patient's elevated INR this A.m.  Poor dentation  - DDS RLenn Cal(dental) does not believe patient would tolerate removal of remaining teeth.  -Counseled patient and  daughter that this may need to be addressed at later date after valve replaced  Enterococcal  bacteremia -Possible contaminant since it was isolated from picc line.  -CT surgery requests patient complete 2 weeks of antibiotics  -Complete 2 weeks of antibiotics with ampicillin -After patient's PEA episode [facility] added Zosyn + echocardiogram azithromycin  Recent GI bleed. -Hemoglobin stable  H/o Aortic dissection -BP rising have restarted some of patient's BP medication -Initially Maintain SBP<145, will slowly titrate medication and obtain lowest SBP patient will tolerate as his physical condition improves  Hematuria:  -hgb stable  HyperNatremia -Per [facility]  Palliative care encounter -Met with whole family to discuss the grave prognosis of patient. Explained at length that the probability of patient surviving this hospitalization was very poor secondary to not being able to treat the underlying problem of his aortic valve thrombosis/infection. In addition patient has multiple other systems which are failing i.e. increasing renal failure, and aortic dissection.   Code Status: FULL Family Communication: No family present at time of exam Disposition Plan: Resolution of bacteremia: SNF    Consultants: Dr.[name] (PCCM) Dr.[name] (cardiology) Dr.[name] (infectious disease) DDS [name] (dental) Dr.[name] (cardiothoracic surgery)   Procedure/Significant Events: 5/20 TEE'; - LVEF=55% to 60%.- Aortic valve: St. Jude Mechanical AVR is present. Only one of the leaflets visualized as moving; significantly reduced; other leaflet is immobile.  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 - Mitral valve: moderate regurgitation. - Left atrium: severely dilated. No thrombus in the atrial cavity or appendage. - Right atrium: moderately dilated. Nothrombus in the atrial cavity or appendage. -Tricuspid valve: moderate regurgitation. 5/23 EGD by Dr. Zenovia Jarred;  - Two  gastric cardia erosions without active bleeding, presumed cause of recent melena- Very mild bulbar duodenitis  5/26 repeat EGD by Dr. Zenovia Jarred; - Large blood clot and fresh blood removed from the proximal stomach - Gastric cardia erosion with visible vessel treated with epinephrine injection and hemostatic clip -4   5/24 upper extremity Doppler;acute deep vein thrombosis involving axillary and brachial veins LUE -Superficial thrombosis in Basilic vein. 6/21 PEA patient intubated transferred to [facility]   Culture 6/9 MRSA by PCR negative 6/10 blood central line 2 positive enterococcus 6/13 blood left/right foot NGTD   Antibiotics: Ampicillin 6/12 (6/13 is day 1/14)>> stopped 6/21 Ceftriaxone 6/12>> stopped 6/15 Zosyn 6/21>> Vancomycin 6/21>>   DVT prophylaxis: Heparin gtt   Devices    LINES / TUBES:      Continuous Infusions: . dextrose Stopped (07/13/2014 2100)  . DOPamine Stopped (07/04/2014 2200)  .  sodium bicarbonate  infusion 1000 mL 100 mL/hr at 07/12/2014 2050  . vasopressin (PITRESSIN) infusion - *FOR SHOCK* Stopped (07/03/2014 1831)    Objective: VITAL SIGNS: Temp: 97.3 F (36.3 C) (06/21 2100) Temp Source: Axillary (06/21 2100) BP: 194/88 mmHg (06/21 2200) Pulse Rate: 111 (06/21 2200) SPO2; FIO2:   Intake/Output Summary (Last 24 hours) at 07/10/2014 2248 Last data filed at 07/25/2014 2200  Gross per 24 hour  Intake 1645.51 ml  Output    260 ml  Net 1385.51 ml     Exam: General: Unresponsive in PEA Eyes: Fixed and dilated ENT: Unable to assess Neck:  Negative scars, masses, torticollis, lymphadenopathy, JVD Lungs: no breath sounds Cardiovascular: PEA Abdomen: Unable to assess except for umbilical herniation (chronic)  Extremities: No significant cyanosis, clubbing, or edema bilateral lower extremities Psychiatric:  Unable to assess Neurologic: Unable to assess     Data Reviewed: Basic Metabolic Panel:  Recent Labs Lab 07/13/14 0340  07/14/14 0330 07/15/14 0428 07/16/14 0406 07/16/14 0800 07/04/2014 0540 07/01/2014 0730 07/08/2014 1700  NA 145 148* 151*  --  147* 149* 149*  --   K 3.5 3.5 3.6  --  3.7 3.8 4.0  --   CL 112* 113* 116*  --  112* 113* 112*  --   CO2 24 25 26   --  25 26 21*  --   GLUCOSE 171* 120* 118*  --  204* 125* 137*  --   BUN 29* 34* 36*  --  45* 50* 54*  --   CREATININE 1.87* 2.08* 2.41*  --  2.97* 3.56* 3.87*  --   CALCIUM 9.3 9.3 9.7  --  9.2 9.3 9.0  --   MG 1.9 2.1 2.0 2.2  --   --   --  2.5*   Liver Function Tests:  Recent Labs Lab 07/13/14 0340 07/14/14 0330 07/15/14 0428 06/30/2014 0730 07/03/2014 1052  AST 25 26 32 39 35  ALT 16* 16* 20 26 25   ALKPHOS 45 50 46 45 44  BILITOT 0.8 0.6 0.8 1.0 1.1  PROT 7.4 7.0 7.4 7.2 7.7  ALBUMIN 2.9* 2.8* 2.9* 3.0* 3.0*   No results for input(s): LIPASE, AMYLASE in the last 168 hours. No results for input(s): AMMONIA in the last 168 hours. CBC:  Recent Labs Lab 07/13/14 0340 07/14/14 0330 07/15/14 0428 07/16/14 0406 07/04/2014 0540 07/01/2014 1700  WBC 7.9 10.3 9.5 8.2 8.8 13.2*  NEUTROABS 5.9 7.4 7.2 6.3 6.2  --   HGB 9.3* 8.9* 8.9* 8.8* 8.7* 8.9*  HCT 29.8* 29.4* 29.7* 29.2* 28.7* 29.6*  MCV 89.0 88.3 90.8 91.5 89.7 89.4  PLT 345 348 346 339 330 326   Cardiac Enzymes:  Recent Labs Lab 07/12/2014 1700  TROPONINI 0.07*   BNP (last 3 results)  Recent Labs  06/14/14 2105  BNP 1438.6*    ProBNP (last 3 results) No results for input(s): PROBNP in the last 8760 hours.  CBG: No results for input(s): GLUCAP in the last 168 hours.  Recent Results (from the past 240 hour(s))  Culture, blood (routine x 2)     Status: None   Collection Time: 07/09/14  4:20 PM  Result Value Ref Range Status   Specimen Description BLOOD LEFT FOOT  Final   Special Requests BOTTLES DRAWN AEROBIC ONLY 10CC  Final   Culture   Final    NO GROWTH 5 DAYS Performed at [facility]    Report Status 07/15/2014 FINAL  Final  Culture, blood (routine x  2)     Status: None   Collection Time: 07/09/14  4:30 PM  Result Value Ref Range Status   Specimen Description BLOOD RIGHT FOOT  Final   Special Requests BOTTLES DRAWN AEROBIC ONLY 3CC  Final   Culture   Final    NO GROWTH 5 DAYS Performed at [facility]    Report Status 07/15/2014 FINAL  Final     Studies:  Recent x-ray studies have been reviewed in detail by the Attending Physician  Scheduled Meds:  Scheduled Meds: . antiseptic oral rinse  7 mL Mouth Rinse q12n4p  .  ceFAZolin (ANCEF) IV  2 g Intravenous To Endo  . chlorhexidine  15 mL Mouth/Throat BID  . fentaNYL (SUBLIMAZE) injection  100 mcg Intravenous Once  . magnesium sulfate 1 - 4 g  bolus IVPB  2 g Intravenous Once  . midazolam      . pantoprazole (PROTONIX) IV  40 mg Intravenous Q12H  . piperacillin-tazobactam (ZOSYN)  IV  3.375 g Intravenous 3 times per day  . sodium bicarbonate  100 mEq Intravenous Once  . sodium bicarbonate  50 mEq Intravenous Once  . vancomycin  2,000 mg Intravenous Once    Time spent on care of this patient: 40 mins   WOODS, Geraldo Docker , MD  [facility] Office  [phone] Pager [phone]  On-Call/Text Page:      [email or web address]      password [email or web address]  If 7PM-7AM, please contact night-coverage [email or web address] Password [email or web address] 07/19/2014, 10:48 PM   LOS: 12 days   Care during the described time interval was provided by me .  I have reviewed this patient's available data, including medical history, events of note, physical examination, and all test results as part of my evaluation. I have personally reviewed and interpreted all radiology studies.   Dia Crawford, MD [phone] Pager

## 2014-07-17 NOTE — Progress Notes (Signed)
ANTIBIOTIC CONSULT NOTE - FOLLOW UP  Pharmacy Consult for vancomycin and zosyn Indication: pneumonia  No Known Allergies  Patient Measurements: Height: 5' 9.5" (176.5 cm) Weight: 232 lb (105.235 kg) IBW/kg (Calculated) : 71.85   Vital Signs: Temp: 97.2 F (36.2 C) (06/21 1113) Temp Source: Oral (06/21 1113) BP: 168/89 mmHg (06/21 1500) Pulse Rate: 84 (06/21 1500) Intake/Output from previous day: 06/20 0701 - 06/21 0700 In: 1631.2 [I.V.:1331.2; IV Piggyback:300] Out: 810 [Urine:810] Intake/Output from this shift: Total I/O In: 200 [I.V.:50; IV Piggyback:150] Out: 155 [Urine:155]  Labs:  Recent Labs  07/15/14 0428 07/16/14 0406 07/16/14 0800 07/02/2014 0540 07/19/2014 1700  WBC 9.5 8.2  --  8.8 13.2*  HGB 8.9* 8.8*  --  8.7* 8.9*  PLT 346 339  --  330 326  CREATININE 2.41*  --  2.97* 3.56*  --    Estimated Creatinine Clearance: 24.3 mL/min (by C-G formula based on Cr of 3.56). No results for input(s): VANCOTROUGH, VANCOPEAK, VANCORANDOM, GENTTROUGH, GENTPEAK, GENTRANDOM, TOBRATROUGH, TOBRAPEAK, TOBRARND, AMIKACINPEAK, AMIKACINTROU, AMIKACIN in the last 72 hours.   Microbiology: Recent Results (from the past 720 hour(s))  Surgical pcr screen     Status: None   Collection Time: 06/20/14  9:33 PM  Result Value Ref Range Status   MRSA, PCR NEGATIVE NEGATIVE Final   Staphylococcus aureus NEGATIVE NEGATIVE Final    Comment:        The Xpert SA Assay (FDA approved for NASAL specimens in patients over 67 years of age), is one component of a comprehensive surveillance program.  Test performance has been validated by [facility] for patients greater than or equal to 46 year old. It is not intended to diagnose infection nor to guide or monitor treatment.   Clostridium Difficile by PCR     Status: None   Collection Time: 06/27/14 10:25 AM  Result Value Ref Range Status   C difficile by pcr NEGATIVE NEGATIVE Final  MRSA PCR Screening     Status: None   Collection  Time: 07/03/2014  5:00 PM  Result Value Ref Range Status   MRSA by PCR NEGATIVE NEGATIVE Final    Comment:        The GeneXpert MRSA Assay (FDA approved for NASAL specimens only), is one component of a comprehensive MRSA colonization surveillance program. It is not intended to diagnose MRSA infection nor to guide or monitor treatment for MRSA infections.   Culture, blood (routine x 2)     Status: None   Collection Time: 07/06/14  4:20 PM  Result Value Ref Range Status   Specimen Description BLOOD CENTRAL LINE  Final   Special Requests BOTTLES DRAWN AEROBIC ONLY 10CC  Final   Culture   Final    ENTEROCOCCUS SPECIES Note: SUSCEPTIBILITIES PERFORMED ON PREVIOUS CULTURE WITHIN THE LAST 5 DAYS. Note: Gram Stain Report Called to,Read Back By and Verified With: A AFATSAWO 07/07/14 1055 BY [email or web address] Performed at [facility]    Report Status 07/09/2014 FINAL  Final  Culture, blood (routine x 2)     Status: None   Collection Time: 07/06/14  4:25 PM  Result Value Ref Range Status   Specimen Description BLOOD CENTRAL LINE  Final   Special Requests BOTTLES DRAWN AEROBIC ONLY 10CC  Final   Culture   Final    ENTEROCOCCUS SPECIES Note: Gram Stain Report Called to,Read Back By and Verified With: A AFATSAWO 07/07/14 1055 BY SMITHERSJ Performed at [facility]    Report Status 07/09/2014 FINAL  Final   Organism ID, Bacteria ENTEROCOCCUS SPECIES  Final      Susceptibility   Enterococcus species - MIC*    AMPICILLIN <=2 SENSITIVE Sensitive     VANCOMYCIN 2 SENSITIVE Sensitive     * ENTEROCOCCUS SPECIES  Culture, blood (routine x 2)     Status: None   Collection Time: 07/09/14  4:20 PM  Result Value Ref Range Status   Specimen Description BLOOD LEFT FOOT  Final   Special Requests BOTTLES DRAWN AEROBIC ONLY 10CC  Final   Culture   Final    NO GROWTH 5 DAYS Performed at [facility]    Report Status 07/15/2014 FINAL  Final  Culture, blood (routine x 2)     Status:  None   Collection Time: 07/09/14  4:30 PM  Result Value Ref Range Status   Specimen Description BLOOD RIGHT FOOT  Final   Special Requests BOTTLES DRAWN AEROBIC ONLY 3CC  Final   Culture   Final    NO GROWTH 5 DAYS Performed at [facility]    Report Status 07/15/2014 FINAL  Final    Anti-infectives    Start     Dose/Rate Route Frequency Ordered Stop   07/18/2014 2200  vancomycin (VANCOCIN) 2,000 mg in sodium chloride 0.9 % 500 mL IVPB     2,000 mg 250 mL/hr over 120 Minutes Intravenous  Once 06/27/2014 1758     07/12/2014 1900  piperacillin-tazobactam (ZOSYN) IVPB 3.375 g     3.375 g 12.5 mL/hr over 240 Minutes Intravenous 3 times per day 07/14/2014 1758     07/06/2014 1000  ceFAZolin (ANCEF) IVPB 2 g/50 mL premix  Status:  Discontinued    Comments:  Give this in place of the 6/21 0800 dose of ampicillin.  Resume ampicillin dosing after this per previous schedule. .   2 g 100 mL/hr over 30 Minutes Intravenous  Once 07/16/14 0937 07/16/14 1015   07/11/2014 1000  ceFAZolin (ANCEF) IVPB 2 g/50 mL premix    Comments:  Give this in place of the 6/21 0800 dose of ampicillin.  Resume ampicillin dosing after this per previous schedule. .   2 g 100 mL/hr over 30 Minutes Intravenous To Endoscopy 07/16/14 1603 07/18/14 1000   06/28/2014 0600  ceFAZolin (ANCEF) IVPB 2 g/50 mL premix  Status:  Discontinued    Comments:  Give this in place of the 6/21 0800 dose of ampicillin.  Resume ampicillin dosing after this per previous schedule. .   2 g 100 mL/hr over 30 Minutes Intravenous To Surgery 07/16/14 1015 07/16/14 1603   07/16/14 1630  ampicillin (OMNIPEN) 2 g in sodium chloride 0.9 % 50 mL IVPB  Status:  Discontinued     2 g 150 mL/hr over 20 Minutes Intravenous Every 8 hours 07/16/14 0948 07/10/2014 1748   07/16/14 1230  ceFAZolin (ANCEF) IVPB 2 g/50 mL premix  Status:  Discontinued    Comments:  Give this in place of the noon dose of ampicillin.  Resume ampicillin dosing starting at 1600 6/20.   2  g 100 mL/hr over 30 Minutes Intravenous  Once 07/16/14 0936 07/16/14 0937   07/10/14 1600  ampicillin (OMNIPEN) 2 g in sodium chloride 0.9 % 50 mL IVPB  Status:  Discontinued     2 g 150 mL/hr over 20 Minutes Intravenous 6 times per day 07/10/14 1301 07/16/14 0948   07/09/14 1800  cefTRIAXone (ROCEPHIN) 2 g in dextrose 5 % 50 mL IVPB - Premix  Status:  Discontinued     2 g 100 mL/hr over 30 Minutes Intravenous Every 12 hours 07/09/14 1444 07/11/14 1257   07/08/14 1415  cefTRIAXone (ROCEPHIN) 2 g in dextrose 5 % 50 mL IVPB - Premix  Status:  Discontinued     2 g 100 mL/hr over 30 Minutes Intravenous Every 24 hours 07/08/14 1409 07/09/14 1444   07/08/14 1415  ampicillin (OMNIPEN) 2 g in sodium chloride 0.9 % 50 mL IVPB  Status:  Discontinued     2 g 150 mL/hr over 20 Minutes Intravenous 3 times per day 07/08/14 1409 07/10/14 1301   07/08/14 1200  vancomycin (VANCOCIN) 1,250 mg in sodium chloride 0.9 % 250 mL IVPB  Status:  Discontinued     1,250 mg 166.7 mL/hr over 90 Minutes Intravenous Every 24 hours 07/07/14 1114 07/08/14 1409   07/07/14 1115  vancomycin (VANCOCIN) 2,000 mg in sodium chloride 0.9 % 500 mL IVPB     2,000 mg 250 mL/hr over 120 Minutes Intravenous  Once 07/07/14 1114 07/07/14 1536   07/07/14 1115  piperacillin-tazobactam (ZOSYN) IVPB 3.375 g  Status:  Discontinued     3.375 g 12.5 mL/hr over 240 Minutes Intravenous 3 times per day 07/07/14 1114 07/08/14 1409      Assessment: ID: Ampicillin for Enterococcus bacteremia - ID suspects contamination from central line, but could be true bacteremia - high risk for prosthetic valve endocarditis.   Code blue called 07/22/2014, cardiac arrest. Tmin 94.2, wbc up to 13.2, cxr looks worse, new orders to broaden abx to vancomycin and zosyn. Central line being placed, will recheck blood cultures and load with vancomycin and start zosyn.  Goal of Therapy:  Vancomycin trough level 15-20 mcg/ml  Plan:  Measure antibiotic drug levels at  steady state Follow up culture results Zosyn 3.375g IV q8 hours - infuse each dose over 4 hours  Vancomycin 2g IV now, will follow up scr in am for standing vancomycin orders.  Recheck blood cxs now  Erin Hearing PharmD., BCPS Clinical Pharmacist Pager [phone] 07/16/2014 6:10 PM

## 2014-07-17 NOTE — Progress Notes (Signed)
PULMONARY / CRITICAL CARE MEDICINE   Name: [name] MRN: [id number] DOB: 06-30-1945    ADMISSION DATE:  07/19/2014 CONSULTATION DATE:  07/06/14  REFERRING MD :  Dr. Thereasa Solo   CHIEF COMPLAINT:  Cardiac Arrest   INITIAL PRESENTATION: 69 y/o M initially admitted to [facility] on 5/19 with SOB in the setting of AV insufficiency after developing a partially thrombosed mechanical AV after stopping coumadin for unclear reasons.  He was scheduled for an AVR on 5/26 but developed recurrent melena.  He underwent an EGD with clipping of gastric erosion on 5/26.  Course was complicated by DVT of the LUE, anemia, dental caries and deconditioning of critical illness.  He was transferred to [place] on 6/3 for planned rehab prior to AVR.  6/7 the patient was taken for multiple tooth extraction.  6/10 afternoon, he suffered a VT arrest requiring ~ 2 min of CPR.  He was intubated post CPR and PCCM consulted for evaluation.   Developed enterococcal bacteremia 6/10 & AMS for severeal days, & developed brady/PEA/resp arrest 6/21, hence PCCM reconsulted   STUDIES:    SIGNIFICANT EVENTS: 5/19-6/03  Admit to [facility] for cardiogenic shock secondary to AV insufficiency, GI bleeding, enteroccocus bacteremia 5/20  Cardiac Cath >> non-obstructive disease, 10% stenosis in LAD & L circumflex  6/03-6/09  Admit to [facility] for rehab.  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 [name] who initially presented to [facility] on 06/13/2014 complaining of SOB. While at  [facility], he was found to have severe aortic insufficiency and transferred to [facility] for further evaluation.  During his hospital stay he had a a GI bleed which was evaluated with EGD, LUE DVT, enterococcal bacteremia, and partially thrombosed mechanical aortic valve.  He was sent to Rehab for his deconditioning, and had   Work up was consistent with a partially thrombosed mechanical valve after coumadin was stopped for unclear reasons (coumadin was stopped 3 months prior to 5/19 admit).  He was treated with intravenous heparin. TEE completed showing ejection fraction of 60%. Only one of the leaflets could be visualized as moving movement was significantly reduced. There was severe aortic insufficiency. 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 Cardiac catheterization showed no significant CAD. Cardiothoracic surgery consulted for prosthetic valve dysfunction and need for surgical intervention. He was planned for AVR revision after rehab.  Hospital course complicated by GI bleed/melena with gastroenterology consulted 06/17/2014. EGD completed 06/18/2014 showing 2 gastric erosions without active bleeding. Patient required transfusion during hospital course. On 06/21/2014 with increased loose tarry stools EGD was repeated showing large gastric blood clot with erosion visible inferior to the GE junction. Hospital course was further complicated by an acute DVT in the left brachial and axillary veins (dx'd 06/24/2014).   He was transferred to [facility] on 6/3 for rehab efforts prior to redo aortic root replacement.  He was seen  by Dr. Enrique Sack on 6/7 for dental carries and underwent extraction on 6/9 of multiple teeth (tooth numbers 3, 4, 5, 13, 14, 21, 23, 24, 25, and 26).  He was discharged from CIR back to [facility] post-operatively for close observation.  Anticoagulation was held for procedure and through 6/10.  Notes reflected planned  AVR for 7-10 days post extraction.  On 6/10 early afternoon, he was noted to have a wide complex / VT arrest requiring approximately 2 minutes of CPR.    On 6/20 TCTS said that he was not a good surgical candidate considering the severe decompensation after the tooth extraction.  Palliative medicine was consulted.  On 6/21 he was to undergo a PEG, but this was held due to an elevated INR.  In the afternoon he had a cardiac arrest, PEA requiring CPR for 12 minutes.  He had two episodes of VT requiring cardioversion and he was intubated. He received amiodarone and epinephrine.  He was started on vasopressin and dopamine.    SUBJECTIVE: unresponsive, hypertensive on dopamine gtt   VITAL SIGNS: Temp:  [94.2 F (34.6 C)-97.4 F (36.3 C)] 97.2 F (36.2 C) (06/21 1113) Pulse Rate:  [70-94] 84 (06/21 1500) Resp:  [11-25] 15 (06/21 1500) BP: (121-198)/(51-94) 168/89 mmHg (06/21 1500) SpO2:  [91 %-99 %] 94 % (06/21 1500) Weight:  [105.235 kg (232 lb)-105.4 kg (232 lb 5.8 oz)] 105.235 kg (232 lb) (06/21 0928)   HEMODYNAMICS:     VENTILATOR SETTINGS:     INTAKE / OUTPUT:  Intake/Output Summary (Last 24 hours) at 07/22/2014 1707 Last data filed at 07/22/2014 1200  Gross per 24 hour  Intake 1247.94 ml  Output    430 ml  Net 817.94 ml    PHYSICAL EXAMINATION: Gen - chronically ill, in mod distress, gagging against ETT ENT - no lesions, no post nasal drip, oral ETT, blood shot eyes Neck: No JVD, no thyromegaly, no carotid bruits Lungs:  use of accessory muscles +, no dullness to percussion, decreased Rt without rales or rhonchi  Cardiovascular: Rhythm regular, heart sounds  normal, diastolic click, no peripheral edema Abdomen: soft and non-tender, no hepatosplenomegaly, BS normal, midline hernia Musculoskeletal: No deformities, no cyanosis or clubbing Neuro:  Unresponsive, ?gripped rt hand to command, some flexion posturing,  Skin:  Warm, no lesions/ rash   LABS:  CBC  Recent  Labs Lab 07/15/14 0428 07/16/14 0406 07/26/2014 0540  WBC 9.5 8.2 8.8  HGB 8.9* 8.8* 8.7*  HCT 29.7* 29.2* 28.7*  PLT 346 339 330   Coag's  Recent Labs Lab 07/05/2014 0540 07/25/2014 1357  INR 2.11* 1.99*     BMET  Recent Labs Lab 07/15/14 0428 07/16/14 0800 07/08/2014 0540  NA 151* 147* 149*  K 3.6 3.7 3.8  CL 116* 112* 113*  CO2 26 25 26   BUN 36* 45* 50*  CREATININE 2.41* 2.97* 3.56*  GLUCOSE 118* 204* 125*   Electrolytes  Recent Labs Lab 07/14/14 0330 07/15/14 0428 07/16/14 0406 07/16/14 0800 07/09/2014 0540  CALCIUM 9.3 9.7  --  9.2 9.3  MG 2.1 2.0 2.2  --   --    Sepsis Markers No results for input(s): LATICACIDVEN, PROCALCITON, O2SATVEN in the last 168 hours.   ABG No results for input(s): PHART, PCO2ART, PO2ART in the last 168 hours.   Liver Enzymes  Recent Labs Lab 07/14/14 0330 07/15/14 0428 07/06/2014 1052  AST 26 32 35  ALT 16* 20 25  ALKPHOS 50 46 44  BILITOT 0.6 0.8 1.1  ALBUMIN 2.8* 2.9* 3.0*   Cardiac Enzymes No results for input(s): TROPONINI, PROBNP in the last 168 hours.   Glucose No results for input(s): GLUCAP in the last 168 hours.  Imaging No results found. ASSESSMENT / PLAN:  PULMONARY OETT 6/21 >> A: Acute Respiratory Failure - in setting of cardiac arrest (again on 6/21), may have had aspiration event vs HCAP on 6/21 per nursing R pleural effusion Acute Pulmonary edema Suspect Aspiration P:   Full vent support Daily SBT/WUA May need thoracentesis rechk ABG in 2h  CARDIOVASCULAR CVL R IJ >> d/c'd 6/10 RUE PICC 6/10 >>  OUT Rt [place] 6/21 >> A:  PEA Arrest on 6/21 > due to aspiration event? HCAP? Doubt  Aortic valve rupture (has wide pulse pressure but hypertensive now, good click) VT arrest on 9/47 Thrombosed mechanical aortic valve, needs revision but deemed not a good candidate Atrial Fibrillation -RVR Cardiogenic shock P:  Place CVL, check Coox Dopamine and vasopressin can be turned off, hypertensive  now Assess troponin, Continue heparin gtt given mechanical and thrombosed aortic mechanical valve   RENAL Lab Results  Component Value Date   CREATININE 3.56* 07/25/2014   CREATININE 2.97* 07/16/2014   CREATININE 2.41* 07/15/2014    A:    AKI - worsening, family says that they do not want dialysis P:   STAT BMET Replace electrolytes as indicated    GASTROINTESTINAL A:   Recent GIB / Gastric Erosion s/p Clipping  Protein Calorie Malnutrition Dysphagia P:   PPI for SUP OGT, consider TF in am 6/21 if remains intubated -PEG was planned but defer for now Monitor for bleeding with anticoagulation and recent bleed   HEMATOLOGIC A:   Anemia - s/p transfusion 6/9 DVT LUE  P:  STAT CBC Continue Heparin gtt   INFECTIOUS A:  Dental Carries s/p Extraction - 6/9, 10 teeth removed compl Enterococcal bacteremia > seen by ID HCAP? P:   BCx2 6/10 >> enterococcus BC x2 6/13 >> no growth Respiratory culture 6/21 >   Ampicillin 6/12 > 6/21 Vanc 6/21 >  Zosyn 6/21 >   ENDOCRINE CBG (last 3)  No results for input(s): GLUCAP in the last 72 hours.  A:   Mild Hyperglycemia   P:   Monitor glucose on BMP  NEUROLOGIC A:   At risk for anoxic encephalopathy  P:   RASS goal:  0 PRN Fentanyl Serial neuro exams  FAMILY  - Updates: Family updated at bedside daughter.   - Palliative medicine consulted 6/20, prior to second cardiac arrest  Prognosis poor,daughter agreed to no CPR , no cardioversion - continue conversation based on progress over next 24-48h  Summary - PEA arrest , downtime about 10 mins, needs neuro prognostication, cause unclear -could be resp arrest , doubt ruptured valve per d/w cards  The patient is critically ill with multiple organ systems failure and requires high complexity decision making for assessment and support, frequent evaluation and titration of therapies, application of advanced monitoring technologies and extensive interpretation of multiple  databases. Critical Care Time devoted to patient care services described in this note independent of APP time is 55 minutes.   Kara Mead MD. Shade Flood. [facility] Pager [phone] If no response call [phone]

## 2014-07-17 NOTE — Progress Notes (Signed)
PT Cancellation Note  Patient Details Name: [name] MRN: [id number] DOB: 04-29-1945   Cancelled Treatment:    Reason Eval/Treat Not Completed: Other (comment) (pt with significant change in function. pt unable to state name, follow commands or respond to questions, pulling at gown and lines with bil mittens on. Pt currently not able to effectively participate in therapy. Will check back at later date for appropriateness.)   [name] 07/26/2014, 9:48 AM [name], [place]

## 2014-07-17 NOTE — Progress Notes (Signed)
Chaplain responded to code blue. No family present at time of code. Chaplain paged when family arrived. Chaplain present for family meeting with MD. Family is weighing out individual family members opinions as well as what pt has reported to some family members. Family is strong and supportive, given the difficulty of pt prognosis. Chaplain offered to keep pt in prayers. Gesture appreciated. Page chaplain as needed.   07/13/2014 1900  Clinical Encounter Type  Visited With Family;Health care provider  Visit Type Initial;Spiritual support;Code  Referral From Nurse  Spiritual Encounters  Spiritual Needs Emotional;Prayer  Stress Factors  Family Stress Factors Major life changes  Fergus Throne, Epifanio Lesches 07/03/2014 7:24 PM

## 2014-07-17 NOTE — Progress Notes (Signed)
SUBJECTIVE:  Very confused this am - pulled PICC line out last night  OBJECTIVE:   Vitals:   Filed Vitals:   07/16/2014 0400 07/03/2014 0600 07/05/2014 0700 06/27/2014 0731  BP: 180/72 144/69 159/84 159/84  Pulse:      Temp:    97.3 F (36.3 C)  TempSrc:    Axillary  Resp: 18 14 23 15   Height:      Weight: 232 lb 5.8 oz (105.4 kg)     SpO2:       I&O's:   Intake/Output Summary (Last 24 hours) at 07/04/2014 8938 Last data filed at 06/29/2014 0700  Gross per 24 hour  Intake 1581.19 ml  Output    810 ml  Net 771.19 ml   TELEMETRY: Reviewed telemetry pt in atrial fibrillation     PHYSICAL EXAM General: Very confused Head: Eyes PERRLA, No xanthomas.   Normal cephalic and atramatic  Lungs:   Clear bilaterally to auscultation and percussion. Heart:   IRRR S1 S2 Pulses are 2+ & equal. Abdomen: Bowel sounds are positive, abdomen soft and non-tender without masses Extremities:   No clubbing, cyanosis or edema.  DP +1    LABS: Basic Metabolic Panel:  Recent Labs  07/15/14 0428 07/16/14 0406 07/16/14 0800 07/21/2014 0540  NA 151*  --  147* 149*  K 3.6  --  3.7 3.8  CL 116*  --  112* 113*  CO2 26  --  25 26  GLUCOSE 118*  --  204* 125*  BUN 36*  --  45* 50*  CREATININE 2.41*  --  2.97* 3.56*  CALCIUM 9.7  --  9.2 9.3  MG 2.0 2.2  --   --    Liver Function Tests:  Recent Labs  07/15/14 0428  AST 32  ALT 20  ALKPHOS 46  BILITOT 0.8  PROT 7.4  ALBUMIN 2.9*   No results for input(s): LIPASE, AMYLASE in the last 72 hours. CBC:  Recent Labs  07/16/14 0406 07/09/2014 0540  WBC 8.2 8.8  NEUTROABS 6.3 6.2  HGB 8.8* 8.7*  HCT 29.2* 28.7*  MCV 91.5 89.7  PLT 339 330   Cardiac Enzymes: No results for input(s): CKTOTAL, CKMB, CKMBINDEX, TROPONINI in the last 72 hours. BNP: Invalid input(s): POCBNP D-Dimer: No results for input(s): DDIMER in the last 72 hours. Hemoglobin A1C: No results for input(s): HGBA1C in the last 72 hours. Fasting Lipid Panel: No results  for input(s): CHOL, HDL, LDLCALC, TRIG, CHOLHDL, LDLDIRECT in the last 72 hours. Thyroid Function Tests: No results for input(s): TSH, T4TOTAL, T3FREE, THYROIDAB in the last 72 hours.  Invalid input(s): FREET3 Anemia Panel: No results for input(s): VITAMINB12, FOLATE, FERRITIN, TIBC, IRON, RETICCTPCT in the last 72 hours. Coag Panel:   Lab Results  Component Value Date   INR 2.11* 07/06/2014   INR 1.37 06/21/2014   INR 1.67* 06/15/2014   ASSESSMENT AND PLAN  1. Aortic stenosis due to a thrombosed Ao Valve: Pending high risk redo surgery once clinically improved and rehabbed.  CVTS surgery saw yesterday and felt patient would probably not be a candidate for redo AVR in the future.   2. VT Arrest/NSVT after dental extractions: Nl EF by echo 5/19. Cont bb therapy. No antiarrhythmics per EP.  3. Acute on chronic diastolic heart failure: Wt has been trending up ever since 5/28 - currently up 18 lbs since then. CVP was 25 yesterday but then patient pulled out PICC. Creatinine increased after IV Lasix.  Will stop diuretics.  Needs renal consult.    4. Enterococcal endocarditis: Abx per IM/ID.  5. Dysphagia: Remains NPO.  6. Atrial fib: Stable rate on IV lopressor. On IV heparin.  INR increased today but not on warfarin.  Will check LFTs.    7. GIB: H/H stable.  8. LUE DVT: On heparin.  9. Hypokalemia: F/u.  10. Acute on chronic stage III Kidney Dzs: renal function worsening after diuretics.  Needs renal consult.    Sueanne Margarita, MD  07/19/2014  8:28 AM

## 2014-07-17 NOTE — Progress Notes (Signed)
Nutrition Follow-up  DOCUMENTATION CODES:  Obesity unspecified  INTERVENTION:   (RD to follow for diet advancement and/or initiation of nutrition support)   Once PEG is placed, recommend:   Initiate Osmolite 1.2 @ 20 ml/hr and increase by 10 ml every 4 hours to goal rate of 65 ml/hr.   Tube feeding regimen provides 1872 kcal (100% of needs), 87 grams of protein, and 1279 ml of H2O.   NUTRITION DIAGNOSIS:  Inadequate oral intake related to dysphagia as evidenced by NPO status.  Ongoing  GOAL:  Patient will meet greater than or equal to 90% of their needs  Progressing  MONITOR:  Diet advancement, Labs, Weight trends, Skin, I & O's  REASON FOR ASSESSMENT:  Consult Enteral/tube feeding initiation and management  ASSESSMENT: 69 y/o M initially admitted to [facility] on 5/19 with SOB in the setting of AV insufficiency after developing a partially thrombosed mechanical AV after stopping coumadin for unclear reasons. He was scheduled for an AVR on 5/26 but developed recurrent melena. He underwent an EGD with clipping of gastric erosion on 5/26.  S/p Procedure(s) (LRB) on 07/10/14: BENTALL PROCEDURE (N/A) REDO STERNOTOMY (N/A) TRANSESOPHAGEAL ECHOCARDIOGRAM (TEE) (N/A)  Pt underwent multiple dental extractions on 07/16/2014.   Pt extubated on 07/08/14.   Per CTS note, pt has tested positive for enterococcal bactremia. ID following.   Pt s/p FEES which revealed severe oral phase dysphagia. MBSS completed on 07/14/14 revealed moderate oral and pharyngeal phased dysphagia. SLP continues to follow and recommend NPO with nutrition via alternative means.   Pt out of room for PEG placement today. Pt has pulled out NGT and PICC line.  Palliative care consulted for goals of care. Discharge disposition is for SNF.   Labs reviewed: Na: 149.   Height:  Ht Readings from Last 1 Encounters:  07/12/2014 5\' 8"  (1.727 m)    Weight:  Wt Readings from Last 1 Encounters:  07/20/2014 232 lb  5.8 oz (105.4 kg)    Ideal Body Weight:  70 kg  Wt Readings from Last 10 Encounters:  07/02/2014 232 lb 5.8 oz (105.4 kg)  07/16/2014 226 lb (102.513 kg)  06/29/14 220 lb 0.3 oz (99.8 kg)  12/27/12 223 lb (101.152 kg)  11/30/12 223 lb 12.8 oz (101.515 kg)  01/13/10 236 lb (107.049 kg)    BMI:  Body mass index is 35.34 kg/(m^2).  Estimated Nutritional Needs:  Kcal:  1800-2000  Protein:  95-105 grams  Fluid:  1.8-2.0 L  Skin:  Reviewed, no issues  Diet Order:  Diet NPO time specified  EDUCATION NEEDS:  No education needs identified at this time   Intake/Output Summary (Last 24 hours) at 07/22/2014 0927 Last data filed at 07/18/2014 0800  Gross per 24 hour  Intake 1481.19 ml  Output    785 ml  Net 696.19 ml    Last BM:  07/14/14  Shernita Rabinovich A. Jimmye Norman, RD, LDN, CDE Pager: [phone] After hours Pager: [phone]

## 2014-07-17 NOTE — Progress Notes (Signed)
eLink Physician-Brief Progress Note Patient Name: [name] DOB: 10-20-1945 MRN: [id number]   Date of Service  06/28/2014  HPI/Events of Note  Cardiac arrest, cpr 8-12 min Now in shock On vent  eICU Interventions  PCCM bedside MD to evaluate Vent orders written Pressors written resp culture done Antibiotics ordered Bicarb gtt given severe acidosis     Intervention Category Major Interventions: Code management / supervision;Shock - evaluation and management  Tysheka Fanguy 07/04/2014, 5:54 PM

## 2014-07-18 ENCOUNTER — Ambulatory Visit (HOSPITAL_COMMUNITY): Payer: Commercial Managed Care - HMO

## 2014-07-18 DIAGNOSIS — D689 Coagulation defect, unspecified: Secondary | ICD-10-CM

## 2014-07-18 DIAGNOSIS — J9601 Acute respiratory failure with hypoxia: Secondary | ICD-10-CM

## 2014-07-18 DIAGNOSIS — I350 Nonrheumatic aortic (valve) stenosis: Secondary | ICD-10-CM

## 2014-07-18 DIAGNOSIS — K08409 Partial loss of teeth, unspecified cause, unspecified class: Secondary | ICD-10-CM

## 2014-07-18 LAB — COMPREHENSIVE METABOLIC PANEL
ALK PHOS: 39 U/L (ref 38–126)
ALT: 21 U/L (ref 17–63)
ANION GAP: 12 (ref 5–15)
AST: 33 U/L (ref 15–41)
Albumin: 2.3 g/dL — ABNORMAL LOW (ref 3.5–5.0)
BUN: 53 mg/dL — AB (ref 6–20)
CHLORIDE: 110 mmol/L (ref 101–111)
CO2: 27 mmol/L (ref 22–32)
Calcium: 8.7 mg/dL — ABNORMAL LOW (ref 8.9–10.3)
Creatinine, Ser: 4.38 mg/dL — ABNORMAL HIGH (ref 0.61–1.24)
GFR calc non Af Amer: 13 mL/min — ABNORMAL LOW (ref >60)
GFR, EST AFRICAN AMERICAN: 15 mL/min — AB (ref >60)
GLUCOSE: 110 mg/dL — AB (ref 65–99)
POTASSIUM: 3.5 mmol/L (ref 3.5–5.1)
Sodium: 149 mmol/L — ABNORMAL HIGH (ref 135–145)
Total Bilirubin: 1.2 mg/dL (ref 0.3–1.2)
Total Protein: 5.9 g/dL — ABNORMAL LOW (ref 6.5–8.1)

## 2014-07-18 LAB — POCT I-STAT 3, ART BLOOD GAS (G3+)
ACID-BASE EXCESS: 4 mmol/L — AB (ref 0.0–2.0)
Acid-Base Excess: 4 mmol/L — ABNORMAL HIGH (ref 0.0–2.0)
Bicarbonate: 26 mEq/L — ABNORMAL HIGH (ref 20.0–24.0)
Bicarbonate: 26.7 mEq/L — ABNORMAL HIGH (ref 20.0–24.0)
O2 Saturation: 70 %
O2 Saturation: 97 %
PH ART: 7.529 — AB (ref 7.350–7.450)
Patient temperature: 97.6
Patient temperature: 98.6
TCO2: 27 mmol/L (ref 0–100)
TCO2: 28 mmol/L (ref 0–100)
pCO2 arterial: 29.7 mmHg — ABNORMAL LOW (ref 35.0–45.0)
pCO2 arterial: 31.9 mmHg — ABNORMAL LOW (ref 35.0–45.0)
pH, Arterial: 7.551 — ABNORMAL HIGH (ref 7.350–7.450)
pO2, Arterial: 31 mmHg — CL (ref 80.0–100.0)
pO2, Arterial: 75 mmHg — ABNORMAL LOW (ref 80.0–100.0)

## 2014-07-18 LAB — PROTIME-INR
INR: 2.05 — AB (ref 0.00–1.49)
Prothrombin Time: 23 seconds — ABNORMAL HIGH (ref 11.6–15.2)

## 2014-07-18 LAB — CBC WITH DIFFERENTIAL/PLATELET
Basophils Absolute: 0 10*3/uL (ref 0.0–0.1)
Basophils Relative: 0 % (ref 0–1)
EOS ABS: 0 10*3/uL (ref 0.0–0.7)
Eosinophils Relative: 0 % (ref 0–5)
HEMATOCRIT: 25.8 % — AB (ref 39.0–52.0)
HEMOGLOBIN: 8.1 g/dL — AB (ref 13.0–17.0)
LYMPHS PCT: 1 % — AB (ref 12–46)
Lymphs Abs: 0.1 10*3/uL — ABNORMAL LOW (ref 0.7–4.0)
MCH: 27.5 pg (ref 26.0–34.0)
MCHC: 31.4 g/dL (ref 30.0–36.0)
MCV: 87.5 fL (ref 78.0–100.0)
MONOS PCT: 15 % — AB (ref 3–12)
Monocytes Absolute: 1.5 10*3/uL — ABNORMAL HIGH (ref 0.1–1.0)
NEUTROS PCT: 84 % — AB (ref 43–77)
Neutro Abs: 8.6 10*3/uL — ABNORMAL HIGH (ref 1.7–7.7)
Platelets: 225 10*3/uL (ref 150–400)
RBC: 2.95 MIL/uL — ABNORMAL LOW (ref 4.22–5.81)
RDW: 19.9 % — AB (ref 11.5–15.5)
WBC: 10.2 10*3/uL (ref 4.0–10.5)

## 2014-07-18 LAB — HEPARIN LEVEL (UNFRACTIONATED)
HEPARIN UNFRACTIONATED: 0.19 [IU]/mL — AB (ref 0.30–0.70)
Heparin Unfractionated: 0.31 IU/mL (ref 0.30–0.70)

## 2014-07-18 MED ORDER — PIPERACILLIN-TAZOBACTAM IN DEX 2-0.25 GM/50ML IV SOLN
2.2500 g | Freq: Four times a day (QID) | INTRAVENOUS | Status: DC
  Administered 2014-07-18 – 2014-07-23 (×20): 2.25 g via INTRAVENOUS
  Filled 2014-07-18 (×26): qty 50

## 2014-07-18 MED ORDER — ACETAMINOPHEN 160 MG/5ML PO SOLN: 650.0000 mg | Freq: Four times a day (QID) | ORAL | Status: DC | PRN

## 2014-07-18 MED ORDER — VANCOMYCIN HCL 10 G IV SOLR
1500.0000 mg | INTRAVENOUS | Status: DC
  Administered 2014-07-20: 1500 mg via INTRAVENOUS
  Filled 2014-07-18: qty 1500

## 2014-07-18 MED ORDER — HEPARIN (PORCINE) IN NACL 100-0.45 UNIT/ML-% IJ SOLN
1200.0000 [IU]/h | INTRAMUSCULAR | Status: DC
  Administered 2014-07-18: 1000 [IU]/h via INTRAVENOUS
  Administered 2014-07-18 – 2014-07-22 (×5): 1200 [IU]/h via INTRAVENOUS
  Filled 2014-07-18 (×11): qty 250

## 2014-07-18 MED ORDER — CETYLPYRIDINIUM CHLORIDE 0.05 % MT LIQD
7.0000 mL | Freq: Four times a day (QID) | OROMUCOSAL | Status: DC
  Administered 2014-07-19 – 2014-07-23 (×13): 7 mL via OROMUCOSAL

## 2014-07-18 MED FILL — Medication: Qty: 1 | Status: AC

## 2014-07-18 NOTE — Progress Notes (Signed)
PULMONARY / CRITICAL CARE MEDICINE   Name: [name] MRN: [id number] DOB: 21-Feb-1945    ADMISSION DATE:  07/12/2014 CONSULTATION DATE:  07/06/14  REFERRING MD :  Dr. Thereasa Solo   CHIEF COMPLAINT:  Cardiac Arrest   INITIAL PRESENTATION: 69 y/o M initially admitted to [facility] on 5/19 with SOB in the setting of AV insufficiency after developing a partially thrombosed mechanical AV after stopping coumadin for unclear reasons.  He was scheduled for an AVR on 5/26 but developed recurrent melena.  He underwent an EGD with clipping of gastric erosion on 5/26.  Course was complicated by DVT of the LUE, anemia, dental caries and deconditioning of critical illness.  He was transferred to [place] on 6/3 for planned rehab prior to AVR.  6/7 the patient was taken for multiple tooth extraction.  6/10 afternoon, he suffered a VT arrest requiring ~ 2 min of CPR.  He was intubated post CPR and PCCM consulted for evaluation.   Developed enterococcal bacteremia 6/10 & AMS for severeal days, & developed brady/PEA/resp arrest 6/21, hence PCCM reconsulted   STUDIES:    SIGNIFICANT EVENTS: 5/19-6/03  Admit to [facility] for cardiogenic shock secondary to AV insufficiency, GI bleeding, enteroccocus bacteremia 5/20  Cardiac Cath >> non-obstructive disease, 10% stenosis in LAD & L circumflex  6/03-6/09  Admit to [facility] CIR for rehab.  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 [name] who initially presented to [facility] on 06/13/2014 complaining of SOB. While at  [facility], he was found to have severe aortic insufficiency and transferred to [facility] for further evaluation.  During his hospital stay he had a a GI bleed which was evaluated with EGD, LUE DVT, enterococcal bacteremia, and partially thrombosed mechanical aortic valve.  He was sent to Rehab for his deconditioning, and had   Work up was consistent with a partially thrombosed mechanical valve after coumadin was stopped for unclear reasons (coumadin was stopped 3 months prior to 5/19 admit).  He was treated with intravenous heparin. TEE completed showing ejection fraction of 60%. Only one of the leaflets could be visualized as moving movement was significantly reduced. There was severe aortic insufficiency. 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 Cardiac catheterization showed no significant CAD. Cardiothoracic surgery consulted for prosthetic valve dysfunction and need for surgical intervention. He was planned for AVR revision after rehab.  Hospital course complicated by GI bleed/melena with gastroenterology consulted 06/17/2014. EGD completed 06/18/2014 showing 2 gastric erosions without active bleeding. Patient required transfusion during hospital course. On 06/21/2014 with increased loose tarry stools EGD was repeated showing large gastric blood clot with erosion visible inferior to the GE junction. Hospital course was further complicated by an acute DVT in the left brachial and axillary veins (dx'd 06/24/2014).   He was transferred to [facility] on 6/3 for rehab efforts prior to redo aortic root replacement.  He was seen  by Dr. Enrique Sack on 6/7 for dental carries and underwent extraction on 6/9 of multiple teeth (tooth numbers 3, 4, 5, 13, 14, 21, 23, 24, 25, and 26).  He was discharged from CIR back to [facility] post-operatively for close observation.  Anticoagulation was held for procedure and through 6/10.  Notes reflected planned  AVR for 7-10 days post extraction.  On 6/10 early afternoon, he was noted to have a wide complex / VT arrest requiring approximately 2 minutes of CPR.    On 6/20 TCTS said that he was not a good surgical candidate considering the severe decompensation after the tooth extraction.  Palliative medicine was consulted.  On 6/21 he was to undergo a PEG, but this was held due to an elevated INR.  In the afternoon he had a cardiac arrest, PEA requiring CPR for 12 minutes.  He had two episodes of VT requiring cardioversion and he was intubated. He received amiodarone and epinephrine.  He was started on vasopressin and dopamine.    SUBJECTIVE:  Awake and following commands Note Palliative care meeting today Progressive renal failure   VITAL SIGNS: Temp:  [97.3 F (36.3 C)-98.3 F (36.8 C)] 98 F (36.7 C) (06/22 1230) Pulse Rate:  [64-142] 79 (06/22 1612) Resp:  [17-29] 20 (06/22 1612) BP: (101-217)/(42-152) 151/63 mmHg (06/22 1612) SpO2:  [89 %-100 %] 100 % (06/22 1612) FiO2 (%):  [100 %] 100 % (06/22 1612) Weight:  [106.7 kg (235 lb 3.7 oz)] 106.7 kg (235 lb 3.7 oz) (06/22 0830)   HEMODYNAMICS: CVP:  [16 mmHg-21 mmHg] 20 mmHg   VENTILATOR SETTINGS: Vent Mode:  [-] PRVC FiO2 (%):  [100 %] 100 % Set Rate:  [20 bmp-28 bmp] 20 bmp Vt Set:  [550 mL-600 mL] 550 mL PEEP:  [5 cmH20] 5 cmH20 Plateau Pressure:  [28 cmH20-35 cmH20] 28 cmH20   INTAKE / OUTPUT:  Intake/Output Summary (Last 24 hours) at 07/18/14 1642 Last data filed at 07/18/14 0800  Gross per 24 hour  Intake 2098.79 ml  Output    135 ml  Net 1963.79 ml    PHYSICAL EXAMINATION: Gen - chronically ill, no distress ENT - no lesions, no post nasal drip, oral ETT, blood shot eyes Neck: No JVD, no thyromegaly, no carotid bruits Lungs:  No use of accessory muscles  decreased Rt without rales or rhonchi  Cardiovascular: Rhythm regular, heart sounds  normal, diastolic click, no peripheral edema Abdomen: soft and non-tender, no  hepatosplenomegaly, BS normal, midline hernia Musculoskeletal: No deformities, no cyanosis or clubbing Neuro:  Eyes open, follows some commands, globally weak Skin:  Warm, no lesions/ rash   LABS:  CBC  Recent Labs Lab 07/12/2014 0540 07/19/2014 1700 07/18/14 0500  WBC 8.8 13.2* 10.2  HGB 8.7* 8.9* 8.1*  HCT 28.7* 29.6* 25.8*  PLT 330 326 225   Coag's  Recent Labs Lab 07/01/2014 0540 07/22/2014 1357 07/18/14 0500  INR 2.11* 1.99* 2.05*     BMET  Recent Labs Lab 07/08/2014 0540 07/02/2014 0730 07/18/14 0500  NA 149* 149* 149*  K 3.8 4.0 3.5  CL 113* 112* 110  CO2 26 21* 27  BUN 50* 54* 53*  CREATININE 3.56* 3.87* 4.38*  GLUCOSE 125* 137* 110*   Electrolytes  Recent Labs Lab 07/15/14 0428 07/16/14 0406  07/09/2014 0540 07/16/2014 0730 07/19/2014 1700 07/18/14 0500  CALCIUM 9.7  --   < > 9.3 9.0  --  8.7*  MG 2.0 2.2  --   --   --  2.5*  --   < > = values in this interval not displayed. Sepsis Markers  Recent Labs Lab 07/24/2014 1730  LATICACIDVEN 4.3*     ABG  Recent Labs Lab 07/07/2014 2019 07/18/14 0530 07/18/14 0542  PHART 7.363 7.529* 7.551*  PCO2ART 41.4 31.9* 29.7*  PO2ART 66.0* 31.0* 75.0*     Liver Enzymes  Recent Labs Lab 07/16/2014 0730 07/22/2014 1052 07/18/14 0500  AST 39 35 33  ALT 26 25 21   ALKPHOS 45 44 39  BILITOT 1.0 1.1 1.2  ALBUMIN 3.0* 3.0* 2.3*   Cardiac Enzymes  Recent Labs Lab 07/04/2014 1700  TROPONINI 0.07*     Glucose No results for input(s): GLUCAP in the last 168 hours.  Imaging Dg Chest Port 1 View  06/29/2014   CLINICAL DATA:  Central line placement.  Evaluate tip position.  EXAM: PORTABLE CHEST - 1 VIEW  COMPARISON:  07/24/2014 at 1723 hours.  FINDINGS: Support apparatus: Unchanged endotracheal tube. New RIGHT subclavian central line is present with the tip in the lower SVC about 1 vertebral body inferior to the carina. Defibrillator pads overlie the chest. Monitoring leads project over the chest.   Cardiomediastinal Silhouette: Unchanged with the RIGHT heart border obscured. Tortuous enlarged thoracic aorta.  Lungs: Relaxation/compressive atelectasis of the RIGHT lung. Retrocardiac density on the LEFT, likely representing atelectasis. No pneumothorax.  Effusions:  Unchanged large RIGHT pleural effusion.  Other:  None.  IMPRESSION: 1. Uncomplicated RIGHT subclavian central line placement. 2. Unchanged RIGHT pleural effusion. 3. Relaxation atelectasis associated with RIGHT effusion.   Electronically Signed   By: Dereck Ligas [name]   On: 07/06/2014 19:26   Portable Chest Xray  07/22/2014   CLINICAL DATA:  Acute respiratory failure with hypoxia, chest pain, hypertension, atrial fibrillation, aortic aneurysm repair, coronary artery disease  EXAM: PORTABLE CHEST - 1 VIEW  COMPARISON:  Portable exam 1723 hours compared to 07/13/2014  FINDINGS: Tip of endotracheal tube projects at the level of the aortic arch, likely in satisfactory position above carina though the carina is poorly localized.  Enlargement of cardiac silhouette post median sternotomy.  External pacing leads present.  Large RIGHT pleural effusion and accompanying atelectasis of RIGHT lung.  Scattered infiltrate/edema bilaterally persists.  No gross pneumothorax identified.  IMPRESSION: Large RIGHT pleural effusion and significant RIGHT lung atelectasis.  Persistent mild diffuse pulmonary infiltrates.   Electronically Signed   By: Lavonia Dana [name]   On: 07/12/2014 17:30   ASSESSMENT / PLAN:  PULMONARY OETT 6/21 >> A: Acute Respiratory Failure - in setting of cardiac arrest (again on 6/21), may have had aspiration event vs HCAP on 6/21  R pleural effusion Acute Pulmonary edema Suspect Aspiration PNA P:   Full vent support Daily SBT/WUA, not in position to extubate for success today 6/22 Could consider R thoracentesis  CARDIOVASCULAR CVL R IJ >> d/c'd 6/10 RUE PICC 6/10 >>  OUT Rt  6/21 >> A:  PEA Arrest on 6/21 > due to  aspiration event? HCAP? Doubt  Aortic valve rupture (has wide pulse pressure but hypertensive now, good click) VT arrest on 4/09 Thrombosed mechanical aortic valve, needs revision but deemed not a good candidate Atrial Fibrillation -RVR Cardiogenic shock P:  Continue heparin gtt given mechanical and thrombosed aortic mechanical valve   RENAL Lab Results  Component Value Date   CREATININE 4.38* 07/18/2014   CREATININE 3.87* 07/01/2014   CREATININE 3.56* 07/14/2014    A:    AKI - worsening, family says that they do not  want dialysis P:   Progressive  renal failure, supporting hemodynamics   GASTROINTESTINAL A:   Recent GIB / Gastric Erosion s/p Clipping  Protein Calorie Malnutrition Dysphagia P:   PPI for SUP OGT, defer TF as we are considering withdrawal of care Monitor for bleeding with anticoagulation and recent bleed   HEMATOLOGIC A:   Anemia - s/p transfusion 6/9 DVT LUE  P:  Follow CBC Continue Heparin gtt   INFECTIOUS A:  Dental Carries s/p Extraction - 6/9, 10 teeth removed compl Enterococcal bacteremia > seen by ID HCAP? P:   BCx2 6/10 >> enterococcus BC x2 6/13 >> no growth Respiratory culture 6/21 >   Ampicillin 6/12 > 6/21 Vanc 6/21 >  Zosyn 6/21 >   ENDOCRINE CBG (last 3)  No results for input(s): GLUCAP in the last 72 hours.  A:   Mild Hyperglycemia   P:   Monitor glucose on BMP  NEUROLOGIC A:   At risk for anoxic encephalopathy P:   RASS goal:  0 PRN Fentanyl Serial neuro exams  FAMILY  - Updates: Family updated at bedside daughter.   - Palliative medicine consulted 6/20, prior to second cardiac arrest. Greatly appreciate Dr Crissie Sickles' help 6/22. He has clarified goals with family. We will likely withdraw care on 6/23 given poor prognosis for a meaningful  Recovery. I agree with this plan. Confirmed DNR status  Independent CC time 65 minutes  Baltazar Apo, MD, PhD 07/18/2014, 4:48 PM [facility] [phone] or if no answer [phone]

## 2014-07-18 NOTE — Progress Notes (Signed)
*  PRELIMINARY RESULTS* Echocardiogram 2D Echocardiogram has been performed.  Leavy Cella 07/18/2014, 11:40 AM

## 2014-07-18 NOTE — Progress Notes (Signed)
SUBJECTIVE:  Intubated and sedated  OBJECTIVE:   Vitals:   Filed Vitals:   07/18/14 0500 07/18/14 0600 07/18/14 0730 07/18/14 0751  BP: 145/57 157/60 159/66   Pulse: 69 78 72   Temp:    98.3 F (36.8 C)  TempSrc:      Resp: 28 18 20    Height:      Weight:      SpO2: 100% 99% 100%    I&O's:   Intake/Output Summary (Last 24 hours) at 07/18/14 0818 Last data filed at 07/18/14 0800  Gross per 24 hour  Intake 2619.29 ml  Output    255 ml  Net 2364.29 ml   TELEMETRY: Reviewed telemetry pt in atrial fibrillation with CVR     PHYSICAL EXAM Lungs:   Clear bilaterally to auscultation and percussion. Heart:   HRRR S1 S2 Pulses are 2+ & equal Abdomen: Bowel sounds are positive, abdomen soft and non-tender without masse Extremities:   No clubbing, cyanosis or edema.  DP +1 Neuro: intubated and follows commands    LABS: Basic Metabolic Panel:  Recent Labs  07/16/14 0406  07/13/2014 0730 07/24/2014 1700 07/18/14 0500  NA  --   < > 149*  --  149*  K  --   < > 4.0  --  3.5  CL  --   < > 112*  --  110  CO2  --   < > 21*  --  27  GLUCOSE  --   < > 137*  --  110*  BUN  --   < > 54*  --  53*  CREATININE  --   < > 3.87*  --  4.38*  CALCIUM  --   < > 9.0  --  8.7*  MG 2.2  --   --  2.5*  --   < > = values in this interval not displayed. Liver Function Tests:  Recent Labs  07/11/2014 1052 07/18/14 0500  AST 35 33  ALT 25 21  ALKPHOS 44 39  BILITOT 1.1 1.2  PROT 7.7 5.9*  ALBUMIN 3.0* 2.3*   No results for input(s): LIPASE, AMYLASE in the last 72 hours. CBC:  Recent Labs  07/08/2014 0540 07/16/2014 1700 07/18/14 0500  WBC 8.8 13.2* 10.2  NEUTROABS 6.2  --  PENDING  HGB 8.7* 8.9* 8.1*  HCT 28.7* 29.6* 25.8*  MCV 89.7 89.4 87.5  PLT 330 326 225   Cardiac Enzymes:  Recent Labs  06/29/2014 1700  TROPONINI 0.07*   BNP: Invalid input(s): POCBNP D-Dimer: No results for input(s): DDIMER in the last 72 hours. Hemoglobin A1C: No results for input(s): HGBA1C in  the last 72 hours. Fasting Lipid Panel: No results for input(s): CHOL, HDL, LDLCALC, TRIG, CHOLHDL, LDLDIRECT in the last 72 hours. Thyroid Function Tests: No results for input(s): TSH, T4TOTAL, T3FREE, THYROIDAB in the last 72 hours.  Invalid input(s): FREET3 Anemia Panel: No results for input(s): VITAMINB12, FOLATE, FERRITIN, TIBC, IRON, RETICCTPCT in the last 72 hours. Coag Panel:   Lab Results  Component Value Date   INR 2.05* 07/18/2014   INR 1.99* 07/09/2014   INR 2.11* 07/08/2014    ASSESSMENT AND PLAN  1. 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   2. VT Arrest/NSVT after dental extractions and now s/p bradycardic cardiac arrest for which he received CPR  for 8-12 minutes with periods of PEA.  Now in atrial fibrillation with CVR Nl EF by echo 5/19. Initially on pressors for hypotension but now off and hypertensive.  No antiarrhythmics per EP.  Now off BB.  He is partial No code with no CPR or cardioversion.    3. Acute on chronic diastolic heart failure: Wt has been trending up ever since 5/28 - currently up 18 lbs since then. CVP was 20 yesterday. Creatinine increased after IV Lasix. Diuretics stopped. Patient does not want HD per nursing staff and family.    4. Enterococcal endocarditis: Abx per IM/ID.  5. Dysphagia: Remains NPO.  6. Atrial fib rate controlled and now off BB due to bradycardic arrest.  On IV heparin. INR increased today but not on warfarin. ? Etiology of elevated INR.  ? Heparin effect.  Platelet count is normal.    7. GIB: H/H stable.  8. LUE DVT: On heparin.  INR elevated for unknown etiology.  LFTs are normal.    9. Hypokalemia: F/u.  10. Acute on chronic stage III Kidney Dzs: renal function worsening after diuretics. Needs renal consult.  Patient critically ill with multisystem  failure.  Prognosis is very poor.  Recommend Palliative Care going forward.  Patient currently is no CPR but family wishes for intubuation and meds.  Need to discuss further with family to proceed with full DNR and palliative care.   Sueanne Margarita, MD  07/18/2014  8:18 AM

## 2014-07-18 NOTE — Progress Notes (Signed)
PROGRESS NOTE: POST OP DAY #12  07/18/2014 [name] [id number]  VITALS: BP 168/60 mmHg  Pulse 80  Temp(Src) 98.3 F (36.8 C) (Axillary)  Resp 20  Ht 5' 9.5" (1.765 m)  Wt 232 lb (105.235 kg)  BMI 33.78 kg/m2  SpO2 99%  LABS:  Lab Results  Component Value Date   WBC 10.2 07/18/2014   HGB 8.1* 07/18/2014   HCT 25.8* 07/18/2014   MCV 87.5 07/18/2014   PLT 225 07/18/2014   BMET    Component Value Date/Time   NA 149* 07/18/2014 0500   K 3.5 07/18/2014 0500   CL 110 07/18/2014 0500   CO2 27 07/18/2014 0500   GLUCOSE 110* 07/18/2014 0500   BUN 53* 07/18/2014 0500   CREATININE 4.38* 07/18/2014 0500   CALCIUM 8.7* 07/18/2014 0500   GFRNONAA 13* 07/18/2014 0500   GFRAA 15* 07/18/2014 0500    Lab Results  Component Value Date   INR 2.05* 07/18/2014   INR 1.99* 07/19/2014   INR 2.11* 07/08/2014   No results [name] for: PTT   [name] is status post multiple dental extractions with alveoloplasty and gross debridement of teeth in the OR on 07/18/2014. Patient had VT cardiac arrest the afternoon of 07/06/14. Patient has had second cardiac arrest on 07/01/2014. Patient now intubated and sedated.     SUBJECTIVE: Patient awake but unresponsive.  EXAM: No sign of oral infection, heme or ooze. No sutures are visible.   ASSESSMENT: Patient with loss of multiple teeth as part of a Pre-AVR redo dental protocol. Patient with history of intermittent bleeding from extraction sites secondary to heparin therapy. No current oral bleeding noted.  PLAN: 1. Oral hygiene by nursing staff per protocols. 2. Reconsult Dental medicine as needed for acute problems.   [name], DDS

## 2014-07-18 NOTE — Progress Notes (Addendum)
Daily Progress Note   Patient Name: [name]       Date: 07/18/2014 DOB: 05/14/1945  Age: 69 y.o. MRN#: [id number] Attending Physician: Rigoberto Noel, MD Primary Care Physician: [name], MD Admit Date: 07/19/2014  Reason for Consultation/Follow-up: Establishing goals of care and Family-clinician negotiation  Subjective: Mr. Hollibaugh does c/o discomfort but cannot talk (intubated) to localize discomfort.  Interval Events: Patient was unable to have PEG due to elevated INR. He had cardiac arrest yesterday with PEA requiring 8-12 resuscitation. Initially on pressors but now off. Dr. Sherral Hammers met with family per his note. Patient at this time is DNR but full press otherwise. His vitals are stable. CCM and cardiology following.  Length of Stay: 13 days  Current Medications: Scheduled Meds:   antiseptic oral rinse  7 mL Mouth Rinse q12n4p   chlorhexidine  15 mL Mouth/Throat BID   fentaNYL (SUBLIMAZE) injection  100 mcg Intravenous Once   magnesium sulfate 1 - 4 g bolus IVPB  2 g Intravenous Once   pantoprazole (PROTONIX) IV  40 mg Intravenous Q12H   piperacillin-tazobactam (ZOSYN)  IV  3.375 g Intravenous 3 times per day   [START ON 07/06/2014] vancomycin  1,500 mg Intravenous Q48H    Continuous Infusions:  dextrose Stopped (07/16/2014 2100)   heparin 1,200 Units/hr (07/18/14 0932)    sodium bicarbonate  infusion 1000 mL 100 mL/hr at 07/18/14 0934    PRN Meds: albuterol, aminocaproic acid, fentaNYL (SUBLIMAZE) injection, fentaNYL (SUBLIMAZE) injection, hydrALAZINE, LORazepam, metoprolol, [DISCONTINUED] ondansetron **OR** ondansetron (ZOFRAN) IV  Palliative Performance Scale: 20%     Vital Signs: BP 168/60 mmHg   Pulse 80   Temp(Src) 98.3 F (36.8 C) (Axillary)   Resp 20   Ht 5' 9.5" (1.765 m)   Wt 105.235 kg (232 lb)   BMI 33.78 kg/m2   SpO2 99% SpO2: SpO2: 99 % O2 Device: O2 Device: Ventilator O2 Flow Rate: O2 Flow Rate (L/min): 2 L/min  Intake/output  summary:  Intake/Output Summary (Last 24 hours) at 07/18/14 1016 Last data filed at 07/18/14 0800  Gross per 24 hour  Intake 2569.29 ml  Output    255 ml  Net 2314.29 ml   LBM:   Baseline Weight: Weight: 108.9 kg (240 lb 1.3 oz) Most recent weight: Weight: 105.235 kg (232 lb)  Physical Exam: Gen'l - overweight, intubated black man in mild discomfort HEENT - intubated, C&S clear Cor - RRR Pulm - on ventilator Neuro - awake, but occasionally nods off. Able to communicate by closing his eyes as a response. Per RN he has not been agitated today.              Additional Data Reviewed: Recent Labs     07/21/2014  0730  07/12/2014  1700  07/18/14  0500  WBC   --   13.2*  10.2  HGB   --   8.9*  8.1*  PLT   --   326  225  NA  149*   --   149*  BUN  54*   --   53*  CREATININE  3.87*   --   4.38*   CMP Latest Ref Rng 07/18/2014 07/21/2014 07/02/2014  Glucose 65 - 99 mg/dL 110(H) - 137(H)  BUN 6 - 20 mg/dL 53(H) - 54(H)  Creatinine 0.61 - 1.24 mg/dL 4.38(H) - 3.87(H)  Sodium 135 - 145 mmol/L 149(H) - 149(H)  Potassium 3.5 - 5.1 mmol/L 3.5 - 4.0  Chloride 101 - 111 mmol/L  110 - 112(H)  CO2 22 - 32 mmol/L 27 - 21(L)  Calcium 8.9 - 10.3 mg/dL 8.7(L) - 9.0  Total Protein 6.5 - 8.1 g/dL 5.9(L) 7.7 7.2  Total Bilirubin 0.3 - 1.2 mg/dL 1.2 1.1 1.0  Alkaline Phos 38 - 126 U/L 39 44 45  AST 15 - 41 U/L 33 35 39  ALT 17 - 63 U/L 21 25 26        Problem List:  Patient Active Problem List   Diagnosis Date Noted   Coagulopathy    Encounter for palliative care    Acute respiratory failure with hypoxia    PEA (Pulseless electrical activity)    Aortic dissection    Hematuria    Palliative care encounter    Protein-calorie malnutrition, severe    Feeding difficulties    Multisystem organ failure    Medically noncompliant    Severe aortic insufficiency    Bleeding gastrointestinal    Hypernatremia    Dysphagia    Aortic valve vegetation    Urinary tract infection due  to Enterococcus 07/10/2014   Enterococcal bacteremia    Cardiac arrest    Aortic valve incompetence    H/O noncompliance with medical treatment, presenting hazards to health    DVT of upper extremity (deep vein thrombosis)    NSVT (nonsustained ventricular tachycardia)    Chronic atrial fibrillation    Acute on chronic renal failure    Hematemesis with nausea    AI (aortic insufficiency)    H/O aortic valve replacement 07/08/2014   Positive blood cultures 07/08/2014   Acute renal insufficiency 07/08/2014   Essential hypertension    Ventricular tachycardia 07/06/2014   VT (ventricular tachycardia) 07/06/2014   Chronic periodontitis 07/13/2014   Anemia 07/25/2014   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   Debility 06/29/2014   Primary gout    Gastric erosion with bleeding    Acute respiratory failure, unspecified whether with hypoxia or hypercapnia    Altered mental status    Accelerated hypertension    Aortic insufficiency    Aortic stenosis    Melena    Gastric erosion    Aortic valve insufficiency 06/14/2014   Cardiogenic shock, possible 06/14/2014   Hx of aortic aneurysm repair 06/14/2014   Atrial fibrillation, do not know length of time 06/14/2014   Elevated troponin 06/14/2014   Positive D dimer 06/14/2014   CHF exacerbation    Encounter for screening colonoscopy 11/30/2012   Duodenal ulcer 11/30/2012   Aortic valve disorders 01/13/2010   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   Plan: breathing trial this PM if he fails will do one-way extubation in AM - Dr. Deitra Mayo notified.  Desire for further  Chaplaincy support:no  3. Symptom Management:  On fentanyl for pain  4. Palliative Prophylaxis:  Stool Softner: not an active issue  5. Prognosis: < 2 weeks  5. Discharge Planning: patient and family considering options Re: continued active treatment vs comfort care/hospice. He is a candidate for residential hospice if he chooses comfort care.    Care plan was discussed with patient, daughters, son, niece, parents, sisters  Thank you for allowing the  Palliative Medicine Team to assist in the care of this patient.   Time In: 1016 Time Out: 1218 Total Time 124  Prolonged Time Billed  yes     Greater than 50%  of this time was spent counseling and coordinating care related to the above assessment and plan.   Adella Hare, MD, [place] Team  07/18/2014, 10:16 AM  Please contact Palliative Medicine Team phone at [phone] for questions and concerns.

## 2014-07-18 NOTE — Progress Notes (Signed)
Nutrition Follow-up  DOCUMENTATION CODES:  Obesity unspecified  INTERVENTION:   (If aggressive care recommend starting tube feeding while on vent)  Recommend:  Initiate Vital High Protein @ 20 ml/hr via feeding tube and increase by 10 ml every 4 hours to goal rate of 40 ml/hr.   60 ml Prostat BID.    Tube feeding regimen provides 1360 kcal, 144 grams of protein, and 802 ml of H2O.   NUTRITION DIAGNOSIS:  Inadequate oral intake related to inability to eat as evidenced by NPO status.  ongoing  GOAL:  Provide needs based on ASPEN/SCCM guidelines  Not met.   MONITOR:  Vent status, Labs, I & O's  REASON FOR ASSESSMENT:  Consult Enteral/tube feeding initiation and management  ASSESSMENT:  Pt admitted to [facility] on 5/19 with SOB in the setting of AV insufficiency after developing a partially thrombosed mechanical AV after stopping coumadin for unclear reasons. He was scheduled for an AVR on 5/26 but developed recurrent melena. He underwent an EGD with clipping of gastric erosion on 5/26. Course was complicated by DVT of the LUE, anemia, dental caries and deconditioning of critical illness. He was transferred to [place] on 6/3 for planned rehab prior to AVR. 6/7 the patient was taken for multiple tooth extraction. 6/10 afternoon, he suffered a VT arrest  Pt failed FEES 6/18, pt pulled out feeding tube multiple times and was scheduled for PEG 6/21. Unable to place PEG due to increased INR. Pt then arrested, tx to ICU and intubated.   Patient is currently intubated on ventilator support MV: 10.5 L/min Temp (24hrs), Avg:97.7 F (36.5 C), Min:97.3 F (36.3 C), Max:98.3 F (36.8 C)   Per RN pt does not currently have enteral access as multiple RNs have been unsuccessful in placing tubes.  Family meeting planned today. Palliative already following.  Labs reviewed: Sodium, BUN/Cr, INR elevated Medications reviewed and include: Mag sulfate  TF recommendations provided if  aggressive care continues.   Height:  Ht Readings from Last 1 Encounters:  07/13/2014 5' 9.5" (1.765 m)    Weight:  Wt Readings from Last 1 Encounters:  07/06/2014 232 lb (105.235 kg)    Ideal Body Weight:  70 kg  Wt Readings from Last 10 Encounters:  07/04/2014 232 lb (105.235 kg)  06/30/2014 226 lb (102.513 kg)  06/29/14 220 lb 0.3 oz (99.8 kg)  12/27/12 223 lb (101.152 kg)  11/30/12 223 lb 12.8 oz (101.515 kg)  01/13/10 236 lb (107.049 kg)    BMI:  Body mass index is 33.78 kg/(m^2).  Estimated Nutritional Needs:  Kcal:  4818-5909  Protein:  >/= 140 grams  Fluid:  > 1.5 L/day  Skin:  Reviewed, no issues  Diet Order:  Diet NPO time specified  EDUCATION NEEDS:  No education needs identified at this time   Intake/Output Summary (Last 24 hours) at 07/18/14 1134 Last data filed at 07/18/14 0800  Gross per 24 hour  Intake 2398.79 ml  Output    255 ml  Net 2143.79 ml    Last BM:  6/18  [place], [place], [place] Pager [phone] After Hours Pager

## 2014-07-18 NOTE — Progress Notes (Addendum)
CONSULT NOTE - Follow Up Consult  Pharmacy Consult for heparin, vancomycin/zosyn Indication: DVT, afib, mechanical AV thrombosis; PNA  No Known Allergies  Patient Measurements: Height: 5' 9.5" (176.5 cm) Weight: 232 lb (105.235 kg) IBW/kg (Calculated) : 71.85 Heparin Dosing Weight: 91.6lg  Vital Signs: Temp: 98.3 F (36.8 C) (06/22 0751) Temp Source: Axillary (06/22 0400) BP: 168/60 mmHg (06/22 0830) Pulse Rate: 80 (06/22 0830)  Labs:  Recent Labs  07/16/14 0406  07/16/14 1810 07/24/2014 0540 07/19/2014 0730 07/09/2014 1357 06/28/2014 1700 07/18/14 0500 07/18/14 0800  HGB 8.8*  --   --  8.7*  --   --  8.9* 8.1*  --   HCT 29.2*  --   --  28.7*  --   --  29.6* 25.8*  --   PLT 339  --   --  330  --   --  326 225  --   LABPROT  --   --   --  23.5*  --  22.5*  --  23.0*  --   INR  --   --   --  2.11*  --  1.99*  --  2.05*  --   HEPARINUNFRC 0.38  --  0.19*  --   --   --   --   --  0.19*  CREATININE  --   < >  --  3.56* 3.87*  --   --  4.38*  --   TROPONINI  --   --   --   --   --   --  0.07*  --   --   < > = values in this interval not displayed.  Estimated Creatinine Clearance: 19.7 mL/min (by C-G formula based on Cr of 4.38).   Medications:  Scheduled:  . antiseptic oral rinse  7 mL Mouth Rinse q12n4p  . chlorhexidine  15 mL Mouth/Throat BID  . fentaNYL (SUBLIMAZE) injection  100 mcg Intravenous Once  . magnesium sulfate 1 - 4 g bolus IVPB  2 g Intravenous Once  . pantoprazole (PROTONIX) IV  40 mg Intravenous Q12H  . piperacillin-tazobactam (ZOSYN)  IV  3.375 g Intravenous 3 times per day  . sodium bicarbonate  100 mEq Intravenous Once  . sodium bicarbonate  50 mEq Intravenous Once  . [START ON 07/12/2014] vancomycin  1,500 mg Intravenous Q48H    Assessment: 69 yo male with mech AV thrombosis, DVT and afib on heparin. Heparin was off with cardiac arrest 6/21 and has been resumed with heparin level now below goal (HL= 0.19). Hg= 8.1, plt= 225.   He is also was on  ampicillin got enterococcus bactermia and now on vancomycin and zosyn for possible PNA.  WBC= 10.2, afebrile, SCr= 4.38 (trend up) and CrCl ~ 20. Vancomycin load given at 1am on 6/22  6/11 Vanc >6/12, 6/21>> 6/11 Zosyn >6/12, 6/21>> Ceftriaxone 6/12 >6/15 Ampicillin 6/12 >6/21  6/10 BCx x 2 > Enterococcus - S Amp and Vanc 6/13 BCx x2 - NEG 6/21 BCx x2>> 6/21 resp  Goal of Therapy:  Heparin level 0.3-0.5 units/ml Monitor platelets by anticoagulation protocol: Yes  Vancomycin trough level 15-20 mcg/ml   Plan:  -Increase heparin to 1200 units/hr -Heparin level in 8 hrs -Daily heparin level and CBC -Change zosyn to 2.25gm IV q6h -Vancomycin 1500mg  IV q48hr -Will follow renal function, cultures and clinical progress  Hildred Laser, Pharm D 07/18/2014 9:24 AM  ______________________________ Addendum:  HL was therapeutic at 0.31.   Continue 1200 units/hr HL with AM labs  Levester Fresh, PharmD, BCPS Clinical Pharmacist Pager [phone] 07/18/2014 8:15 PM

## 2014-07-18 NOTE — Progress Notes (Signed)
Daily Rounding Note  07/18/2014, 9:06 AM  LOS: 13 days   SUBJECTIVE:       Yesterday had PEA  arrest, CPR for 8-12 minutes, intubated, shock ensued. Post arrest EKG with rapid a fib but no new ischemia.  Bicarb for severe acidosis. Initially on pressors, now off. Remains intubated on vent.   OBJECTIVE:         Vital signs in last 24 hours:    Temp:  [96.9 F (36.1 C)-98.3 F (36.8 C)] 98.3 F (36.8 C) (06/22 0751) Pulse Rate:  [64-142] 80 (06/22 0830) Resp:  [14-29] 20 (06/22 0830) BP: (101-217)/(42-152) 168/60 mmHg (06/22 0830) SpO2:  [89 %-100 %] 99 % (06/22 0830) FiO2 (%):  [100 %] 100 % (06/22 0830) Weight:  [232 lb (105.235 kg)] 232 lb (105.235 kg) (06/21 0928) Last BM Date: 07/14/14 (last documented BM in Epic) Filed Weights   07/16/14 0339 07/10/2014 0400 07/25/2014 0928  Weight: 233 lb 4 oz (105.8 kg) 232 lb 5.8 oz (105.4 kg) 232 lb (105.235 kg)   General: unresponsive on vent   Heart: RRR Chest: clear bil.  On vent Abdomen: soft, hypoactive BS.   Extremities: no Neuro/Psych:  Unresponsive.   Intake/Output from previous day: 06/21 0701 - 06/22 0700 In: 2449.3 [I.V.:1699.3; IV Piggyback:750] Out: 265 [Urine:265]  Intake/Output this shift: Total I/O In: 220 [I.V.:220] Out: 25 [Urine:25]  Lab Results:  Recent Labs  07/14/2014 0540 07/25/2014 1700 07/18/14 0500  WBC 8.8 13.2* 10.2  HGB 8.7* 8.9* 8.1*  HCT 28.7* 29.6* 25.8*  PLT 330 326 225   BMET  Recent Labs  07/10/2014 0540 07/05/2014 0730 07/18/14 0500  NA 149* 149* 149*  K 3.8 4.0 3.5  CL 113* 112* 110  CO2 26 21* 27  GLUCOSE 125* 137* 110*  BUN 50* 54* 53*  CREATININE 3.56* 3.87* 4.38*  CALCIUM 9.3 9.0 8.7*   LFT  Recent Labs  07/01/2014 0730 07/22/2014 1052 07/18/14 0500  PROT 7.2 7.7 5.9*  ALBUMIN 3.0* 3.0* 2.3*  AST 39 35 33  ALT 26 25 21   ALKPHOS 45 44 39  BILITOT 1.0 1.1 1.2  BILIDIR  --  0.4  --   IBILI  --  0.7  --     PT/INR  Recent Labs  07/14/2014 1357 07/18/14 0500  LABPROT 22.5* 23.0*  INR 1.99* 2.05*   Hepatitis Panel No results for input(s): HEPBSAG, HCVAB, HEPAIGM, HEPBIGM in the last 72 hours.  Studies/Results: Dg Chest Port 1 View  07/26/2014   CLINICAL DATA:  Central line placement.  Evaluate tip position.  EXAM: PORTABLE CHEST - 1 VIEW  COMPARISON:  07/10/2014 at 1723 hours.  FINDINGS: Support apparatus: Unchanged endotracheal tube. New RIGHT subclavian central line is present with the tip in the lower SVC about 1 vertebral body inferior to the carina. Defibrillator pads overlie the chest. Monitoring leads project over the chest.  Cardiomediastinal Silhouette: Unchanged with the RIGHT heart border obscured. Tortuous enlarged thoracic aorta.  Lungs: Relaxation/compressive atelectasis of the RIGHT lung. Retrocardiac density on the LEFT, likely representing atelectasis. No pneumothorax.  Effusions:  Unchanged large RIGHT pleural effusion.  Other:  None.  IMPRESSION: 1. Uncomplicated RIGHT subclavian central line placement. 2. Unchanged RIGHT pleural effusion. 3. Relaxation atelectasis associated with RIGHT effusion.   Electronically Signed   By: Dereck Ligas [name]   On: 06/28/2014 19:26   Portable Chest Xray  06/27/2014   CLINICAL DATA:  Acute respiratory failure with  hypoxia, chest pain, hypertension, atrial fibrillation, aortic aneurysm repair, coronary artery disease  EXAM: PORTABLE CHEST - 1 VIEW  COMPARISON:  Portable exam 1723 hours compared to 07/13/2014  FINDINGS: Tip of endotracheal tube projects at the level of the aortic arch, likely in satisfactory position above carina though the carina is poorly localized.  Enlargement of cardiac silhouette post median sternotomy.  External pacing leads present.  Large RIGHT pleural effusion and accompanying atelectasis of RIGHT lung.  Scattered infiltrate/edema bilaterally persists.  No gross pneumothorax identified.  IMPRESSION: Large RIGHT pleural  effusion and significant RIGHT lung atelectasis.  Persistent mild diffuse pulmonary infiltrates.   Electronically Signed   By: Lavonia Dana [name]   On: 07/04/2014 17:30   Scheduled Meds: . antiseptic oral rinse  7 mL Mouth Rinse q12n4p  .  ceFAZolin (ANCEF) IV  2 g Intravenous To Endo  . chlorhexidine  15 mL Mouth/Throat BID  . fentaNYL (SUBLIMAZE) injection  100 mcg Intravenous Once  . magnesium sulfate 1 - 4 g bolus IVPB  2 g Intravenous Once  . pantoprazole (PROTONIX) IV  40 mg Intravenous Q12H  . piperacillin-tazobactam (ZOSYN)  IV  3.375 g Intravenous 3 times per day  . sodium bicarbonate  100 mEq Intravenous Once  . sodium bicarbonate  50 mEq Intravenous Once   Continuous Infusions: . dextrose Stopped (07/22/2014 2100)  . heparin 1,000 Units/hr (07/18/14 0800)  .  sodium bicarbonate  infusion 1000 mL 100 mL/hr at 07/18/14 0800   PRN Meds:.albuterol, aminocaproic acid, fentaNYL (SUBLIMAZE) injection, fentaNYL (SUBLIMAZE) injection, hydrALAZINE, LORazepam, metoprolol, [DISCONTINUED] ondansetron **OR** ondansetron (ZOFRAN) IV   ASSESMENT:   * Dysphagia. OP dysphagia per SLP. Needs PEG. Has pulled out previous feeding tubes.  Feeding problems and malnutrition.  *  Coagulopathy.  Day 2 of 3 subq vitmain k. INR worse.   * VT cardiac arrest post dental extractions 6/9. Now recurrent PEA arrest last night. Prognosis poor.   * GIB, CG and hematemesis. S/p 2 EGDs (second 5/26 with clipping/epi to VV at caridia): bleeding from gastric erosions.  * UE DVT. On Heparin gtt.   *  Encephalopathy.   * Anemia. Normocytic. Stable. No transfusions to date.   *  Enterococcal bacteremia (? Contaminant).  To complete 2 weeks of ampicillin, currently on Zosyn, vanc.    *  Acute on chronic stage 3 CKD, worsening .  Oliguric with 810 to 265 ml urine output over previous 3 days.   Current IVF at 60 ml/hour but on multiple drips as well which add something to overall intake.    PLAN     *  PEG tentatively set for 6/24, when MAC sedation available.  If coags do not normalize or if care plans change we will cancel case.     *  Dr Linda Hedges of palliative care meeting with family (son & dtr) today. Depending on outcome, may need to get renal MD input.      [name]  07/18/2014, 9:06 AM Pager: [phone]  I saw the patient. This poor man has become too ill to undergo PEG in my opinion.  Am signing off. You can use OGT or NGT to deliver nutrition now if desired. I do not think we can save him.  Gatha Mayer, MD, [facility] Gastroenterology [phone] (pager) 07/18/2014 4:09 PM

## 2014-07-18 NOTE — Progress Notes (Deleted)
[facility] Progress Note Patient Name: [name] DOB: 1945-12-11 MRN: [id number]   Date of Service  07/18/2014  HPI/Events of Note    eICU Interventions  Entered in error     Intervention Category Minor Interventions: Routine modifications to care plan (e.g. PRN medications for pain, fever)  Jillian Pianka 07/18/2014, 5:43 PM

## 2014-07-18 NOTE — Progress Notes (Signed)
eLink Physician-Brief Progress Note Patient Name: [name] DOB: Oct 09, 1945 MRN: [id number]   Date of Service  07/18/2014  HPI/Events of Note  Resp alkalosis  eICU Interventions  Decrease RR on vent from 28 to 20.     Intervention Category Major Interventions: Acid-Base disturbance - evaluation and management  DETERDING,ELIZABETH 07/18/2014, 5:55 AM

## 2014-07-19 DIAGNOSIS — E8770 Fluid overload, unspecified: Secondary | ICD-10-CM | POA: Insufficient documentation

## 2014-07-19 LAB — CBC
HEMATOCRIT: 27 % — AB (ref 39.0–52.0)
Hemoglobin: 8.3 g/dL — ABNORMAL LOW (ref 13.0–17.0)
MCH: 26.8 pg (ref 26.0–34.0)
MCHC: 30.7 g/dL (ref 30.0–36.0)
MCV: 87.1 fL (ref 78.0–100.0)
Platelets: 282 10*3/uL (ref 150–400)
RBC: 3.1 MIL/uL — ABNORMAL LOW (ref 4.22–5.81)
RDW: 20.2 % — AB (ref 11.5–15.5)
WBC: 11 10*3/uL — ABNORMAL HIGH (ref 4.0–10.5)

## 2014-07-19 LAB — GLUCOSE, CAPILLARY: GLUCOSE-CAPILLARY: 133 mg/dL — AB (ref 65–99)

## 2014-07-19 LAB — HEPARIN LEVEL (UNFRACTIONATED): Heparin Unfractionated: 0.28 IU/mL — ABNORMAL LOW (ref 0.30–0.70)

## 2014-07-19 MED ORDER — FUROSEMIDE 10 MG/ML IJ SOLN
40.0000 mg | Freq: Every day | INTRAMUSCULAR | Status: DC
  Administered 2014-07-19: 40 mg via INTRAVENOUS
  Filled 2014-07-19 (×2): qty 4

## 2014-07-19 NOTE — Progress Notes (Signed)
Decreased to 40 due to stable sats

## 2014-07-19 NOTE — Progress Notes (Signed)
PT Cancellation Note  Patient Details Name: [name] MRN: [id number] DOB: 1945/11/04   Cancelled Treatment:    Reason Eval/Treat Not Completed: Medical issues which prohibited therapy (pt with new PEA, VDRF and will sign off and await new order)   [name] 07/19/2014, 7:04 AM [name], [place]

## 2014-07-19 NOTE — Progress Notes (Signed)
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 [name].   Presented to [facility] hospital on 06/11/2014 complaining of SOB. 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 Also noted to be in a-fib. Evaluated by CT surgery for consideration of high risk AVR. Cath showed mild CAD however cannot visually distal RCA system. Surgery delayed due to recurrent GI bleed treated with EGDx2 and clipping. I discharged the patient to CIR on 6/3 to max out physical therapy before high risk AVR. Underwent dental extraction on 6/10, post op, had VT arrest, s/p CPR and intubation. ID consulted after blood cx came back with enteroccocal infection (likely contamination from central line)  HPI/Subjective:  6/21 as I entered room patient went into PEA, CODE BLUE was called and CPR was begun. CPR lasted from~1645-1730. During this time patient's actual total time in arrest was~15 minutes. Initially regained Return of spontaneous circulation (ROSC), then patient went into PEA again with subsequent ROSC 6/ 23 patient remained intubated  visited in his room on the 2 heart unit. I discussed her ongoing care with his nurse. Patient has been made DO NOT RESUSCITATE  Assessment/Plan: Fluid overload -Strict in and out since admission + 10.7 L -Gentle diuresis Lasix 40 mg daily      Exam: General: Eyes open nods yes and no to questions Lungs: diffuse rhonchi  Cardiovascular: Irregular irregular rhythm and rate, did not appreciate patient's grade 4/6 systolic murmur (click) Abdomen: Unable to assess except for umbilical herniation (chronic)  Extremities: No  significant cyanosis, clubbing, or edema bilateral lower extremities     Data Reviewed: Basic Metabolic Panel:  Recent Labs Lab 07/13/14 0340 07/14/14 0330 07/15/14 0428 07/16/14 0406 07/16/14 0800 07/01/2014 0540 07/12/2014 0730 07/07/2014 1700 07/18/14 0500  NA 145 148* 151*  --  147* 149* 149*  --  149*  K 3.5 3.5 3.6  --  3.7 3.8 4.0  --  3.5  CL 112* 113* 116*  --  112* 113* 112*  --  110  CO2 24 25 26   --  25 26 21*  --  27  GLUCOSE 171* 120* 118*  --  204* 125* 137*  --  110*  BUN 29* 34* 36*  --  45* 50* 54*  --  53*  CREATININE 1.87* 2.08* 2.41*  --  2.97* 3.56* 3.87*  --  4.38*  CALCIUM 9.3 9.3 9.7  --  9.2 9.3 9.0  --  8.7*  MG 1.9 2.1 2.0 2.2  --   --   --  2.5*  --    Liver Function Tests:  Recent Labs Lab 07/14/14 0330 07/15/14 0428 07/16/2014 0730 06/30/2014 1052 07/18/14 0500  AST 26 32 39 35 33  ALT 16* 20 26 25 21   ALKPHOS 50 46 45 44 39  BILITOT 0.6 0.8 1.0 1.1 1.2  PROT 7.0 7.4 7.2 7.7 5.9*  ALBUMIN 2.8* 2.9* 3.0* 3.0* 2.3*   No results for input(s): LIPASE, AMYLASE in the last 168 hours. No results for input(s): AMMONIA in the last 168 hours. CBC:  Recent Labs Lab 07/14/14 0330 07/15/14 0428 07/16/14 0406 07/22/2014 0540 07/20/2014 1700  07/18/14 0500 07/19/14 0524  WBC 10.3 9.5 8.2 8.8 13.2* 10.2 11.0*  NEUTROABS 7.4 7.2 6.3 6.2  --  8.6*  --   HGB 8.9* 8.9* 8.8* 8.7* 8.9* 8.1* 8.3*  HCT 29.4* 29.7* 29.2* 28.7* 29.6* 25.8* 27.0*  MCV 88.3 90.8 91.5 89.7 89.4 87.5 87.1  PLT 348 346 339 330 326 225 282   Cardiac Enzymes:  Recent Labs Lab 07/11/2014 1700  TROPONINI 0.07*   BNP (last 3 results)  Recent Labs  06/14/14 2105  BNP 1438.6*    ProBNP (last 3 results) No results for input(s): PROBNP in the last 8760 hours.  CBG:  Recent Labs Lab 07/19/14 0218  GLUCAP 133*    Recent Results (from the past 240 hour(s))  Culture, blood (routine x 2)     Status: None (Preliminary result)   Collection Time: 07/09/2014  7:16 PM  Result  Value Ref Range Status   Specimen Description BLOOD CENTRAL LINE  Final   Special Requests BOTTLES DRAWN AEROBIC AND ANAEROBIC 10CC  Final   Culture NO GROWTH 2 DAYS  Final   Report Status PENDING  Incomplete  Culture, blood (routine x 2)     Status: None (Preliminary result)   Collection Time: 07/16/2014  8:00 PM  Result Value Ref Range Status   Specimen Description BLOOD RIGHT HAND  Final   Special Requests BOTTLES DRAWN AEROBIC ONLY 6CC  Final   Culture NO GROWTH 2 DAYS  Final   Report Status PENDING  Incomplete  Culture, respiratory (NON-Expectorated)     Status: None (Preliminary result)   Collection Time: 07/25/2014  8:33 PM  Result Value Ref Range Status   Specimen Description TRACHEAL ASPIRATE  Final   Special Requests NONE  Final   Gram Stain   Final    RARE WBC PRESENT, PREDOMINANTLY PMN NO SQUAMOUS EPITHELIAL CELLS SEEN NO ORGANISMS SEEN Performed at [facility]    Culture   Final    NO GROWTH 1 DAY Performed at [facility]    Report Status PENDING  Incomplete     Studies:  Recent x-ray studies have been reviewed in detail by the Attending Physician  Scheduled Meds:  Scheduled Meds: . antiseptic oral rinse  7 mL Mouth Rinse QID  . chlorhexidine  15 mL Mouth/Throat BID  . fentaNYL (SUBLIMAZE) injection  100 mcg Intravenous Once  . magnesium sulfate 1 - 4 g bolus IVPB  2 g Intravenous Once  . pantoprazole (PROTONIX) IV  40 mg Intravenous Q12H  . piperacillin-tazobactam (ZOSYN)  IV  2.25 g Intravenous 4 times per day  . [START ON 07/10/2014] vancomycin  1,500 mg Intravenous Q48H    Time spent on care of this patient: 40 mins   Inaya Gillham, Geraldo Docker , MD  [facility] Office  [phone] Pager - [phone]  On-Call/Text Page:      [email or web address]      password [email or web address]  If 7PM-7AM, please contact night-coverage [email or web address] Password [email or web address] 07/19/2014, 8:12 PM   LOS: 14 days   Care during the described time interval was provided by me .  I  have reviewed this patient's available data, including medical history, events of note, physical examination, and all test results as part of my evaluation. I have personally reviewed and interpreted all radiology studies.   Dia Crawford, MD [phone] Pager

## 2014-07-19 NOTE — Progress Notes (Signed)
CONSULT NOTE - Follow Up Consult  Pharmacy Consult for heparin Indication: DVT, afib, mechanical AV thrombosis  No Known Allergies  Patient Measurements: Height: 5' 9.5" (176.5 cm) Weight: 246 lb 14.6 oz (112 kg) IBW/kg (Calculated) : 71.85 Heparin Dosing Weight: 91.6lg  Vital Signs: Temp: 98 F (36.7 C) (06/23 1126) Temp Source: Oral (06/23 1126) BP: 151/51 mmHg (06/23 1321) Pulse Rate: 81 (06/23 1321)  Labs:  Recent Labs  07/25/2014 0540 07/19/2014 0730 07/04/2014 1357 07/13/2014 1700 07/18/14 0500 07/18/14 0800 07/18/14 1930 07/19/14 0524  HGB 8.7*  --   --  8.9* 8.1*  --   --  8.3*  HCT 28.7*  --   --  29.6* 25.8*  --   --  27.0*  PLT 330  --   --  326 225  --   --  282  LABPROT 23.5*  --  22.5*  --  23.0*  --   --   --   INR 2.11*  --  1.99*  --  2.05*  --   --   --   HEPARINUNFRC  --   --   --   --   --  0.19* 0.31 0.28*  CREATININE 3.56* 3.87*  --   --  4.38*  --   --   --   TROPONINI  --   --   --  0.07*  --   --   --   --     Estimated Creatinine Clearance: 20.3 mL/min (by C-G formula based on Cr of 4.38).   Medications:  Scheduled:  . antiseptic oral rinse  7 mL Mouth Rinse QID  . chlorhexidine  15 mL Mouth/Throat BID  . fentaNYL (SUBLIMAZE) injection  100 mcg Intravenous Once  . magnesium sulfate 1 - 4 g bolus IVPB  2 g Intravenous Once  . pantoprazole (PROTONIX) IV  40 mg Intravenous Q12H  . piperacillin-tazobactam (ZOSYN)  IV  2.25 g Intravenous 4 times per day  . [START ON 07/15/2014] vancomycin  1,500 mg Intravenous Q48H    Assessment: 69 yo male with mech AV thrombosis, DVT and afib on heparin. Heparin was off with cardiac arrest 6/21 and has been resumed with heparin level slightly below goal (HL= 0.28). Hg= 8.3, plt= 282.   -Plans noted for likely withdraw care today.  Goal of Therapy:  Heparin level 0.3-0.5 units/ml Monitor platelets by anticoagulation protocol: Yes     Plan:  -Will not change heparin rate patient is close to goal and  likely plans for withdraw care -Daily heparin level and CBC while on heparin  Hildred Laser, Pharm D 07/19/2014 2:37 PM

## 2014-07-19 NOTE — Progress Notes (Signed)
Vent settings changed per Dr Lamonte Sakai

## 2014-07-19 NOTE — Progress Notes (Signed)
Multiple (20+) family in conference room with MD from palliative care to discuss pt's plan of care; questions encouraged and answered; will move forward with SBT per MD orders and discuss results with daughter/POA Judeen Hammans this afternoon per Sherry's report

## 2014-07-19 NOTE — Progress Notes (Signed)
Daily Progress Note   Patient Name: [name]       Date: 07/19/2014 DOB: 1946-01-13  Age: 69 y.o. MRN#: [id number] Attending Physician: Collene Gobble, MD Primary Care Physician: [name], MD Admit Date: 07/18/2014  Reason for Consultation/Follow-up: Establishing goals of care  Subjective: Alert, follows commands on vent  Length of Stay: 14 days  Current Medications: Scheduled Meds:  . antiseptic oral rinse  7 mL Mouth Rinse QID  . chlorhexidine  15 mL Mouth/Throat BID  . fentaNYL (SUBLIMAZE) injection  100 mcg Intravenous Once  . magnesium sulfate 1 - 4 g bolus IVPB  2 g Intravenous Once  . pantoprazole (PROTONIX) IV  40 mg Intravenous Q12H  . piperacillin-tazobactam (ZOSYN)  IV  2.25 g Intravenous 4 times per day  . [START ON 07/07/2014] vancomycin  1,500 mg Intravenous Q48H    Continuous Infusions: . heparin 1,200 Units/hr (07/19/14 0800)  .  sodium bicarbonate  infusion 1000 mL 100 mL/hr at 07/19/14 0800    PRN Meds: albuterol, aminocaproic acid, fentaNYL (SUBLIMAZE) injection, fentaNYL (SUBLIMAZE) injection, hydrALAZINE, LORazepam, metoprolol, [DISCONTINUED] ondansetron **OR** ondansetron (ZOFRAN) IV    Vital Signs: BP 168/77 mmHg  Pulse 85  Temp(Src) 98 F (36.7 C) (Oral)  Resp 21  Ht 5' 9.5" (1.765 m)  Wt 112 kg (246 lb 14.6 oz)  BMI 35.95 kg/m2  SpO2 99% SpO2: SpO2: 99 % O2 Device: O2 Device: Ventilator O2 Flow Rate: O2 Flow Rate (L/min): 2 L/min  Intake/output summary:  Intake/Output Summary (Last 24 hours) at 07/19/14 1520 Last data filed at 07/19/14 1400  Gross per 24 hour  Intake   2776 ml  Output   1150 ml  Net   1626 ml   LBM:   Baseline Weight: Weight: 108.9 kg (240 lb 1.3 oz) Most recent weight: Weight: 112 kg (246 lb 14.6 oz)  Physical Exam: GEN: Alert, vented, NAD HEENT: [place], sclera anicteric CV: reg rate ABD: soft EXT: edematous       Additional Data Reviewed: Recent Labs     07/22/2014  0730   07/18/14  0500   07/19/14  0524  WBC   --    < >  10.2  11.0*  HGB   --    < >  8.1*  8.3*  PLT   --    < >  225  282  NA  149*   --   149*   --   BUN  54*   --   53*   --   CREATININE  3.87*   --   4.38*   --    < > = values in this interval not displayed.     Problem List:  Patient Active Problem List   Diagnosis Date Noted  . Coagulopathy   . Encounter for palliative care   . Acute respiratory failure with hypoxia   . PEA (Pulseless electrical activity)   . Aortic dissection   . Hematuria   . Palliative care encounter   . Protein-calorie malnutrition, severe   . Feeding difficulties   . Multisystem organ failure   . Medically noncompliant   . Severe aortic insufficiency   . Bleeding gastrointestinal   . Hypernatremia   . Dysphagia   . Aortic valve vegetation   . Urinary tract infection due to Enterococcus 07/10/2014  . Enterococcal bacteremia   . Cardiac arrest   . Aortic valve incompetence   . H/O noncompliance with medical treatment, presenting hazards to  health   . DVT of upper extremity (deep vein thrombosis)   . NSVT (nonsustained ventricular tachycardia)   . Chronic atrial fibrillation   . Acute on chronic renal failure   . Hematemesis with nausea   . AI (aortic insufficiency)   . H/O aortic valve replacement 07/08/2014  . Positive blood cultures 07/08/2014  . Acute renal insufficiency 07/08/2014  . Essential hypertension   . Ventricular tachycardia 07/06/2014  . VT (ventricular tachycardia) 07/06/2014  . 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 Anemia 06/29/2014  . 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 Debility 06/29/2014  . Primary gout   . Gastric erosion with bleeding   . Acute respiratory failure, unspecified whether with hypoxia or hypercapnia   . Altered mental status   . Accelerated hypertension   . Aortic insufficiency   . Aortic stenosis   . Melena   . Gastric erosion   . Aortic valve insufficiency 06/14/2014  . Cardiogenic shock, possible 06/14/2014  .  Hx of aortic aneurysm repair 06/14/2014  . Atrial fibrillation, do not know length of time 06/14/2014  . Elevated troponin 06/14/2014  . Positive D dimer 06/14/2014  . CHF exacerbation   . Encounter for screening colonoscopy 11/30/2012  . Duodenal ulcer 11/30/2012  . Aortic valve disorders 01/13/2010  . 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 OTHER CHEST PAIN 01/13/2010  . OTHER ACQUIRED ABSENCE OF ORGAN 01/13/2010     Palliative Care Assessment & Plan  69 yo male with AI, AKI, resp failure, cardiac arrest x2  Code Status:  DNR  Goals of Care:  Long family meeting today with appx 56 family members. Reviewed clinical situation regarding resp failure requiring mechanical ventialiton, aortic insufficiency, worsening renal function, volume overload, dysphagia, infectious concerns, etc.  In summary, multiple family members on different page and poor insight.  Their expectation with taking him off vent is that he was likely going to improve and eventually get surgery.  I think this is unlikely to occur.  Some family expressed mistrust at this estimation, with a sister stating she was once told she had "6 months to live and that was 60 years ago".  Multiple other family members very strong in beliefs that artificial feeding/hydration will allow him to get strength and improve to point of tolerating aortic valve replacement. We talked about difficulties of this and ultimately how artifical nutrition will not solve his valvular dysfunction, fix his kidney injury, and arguably may not alter his aspiration risk significantly.  Clear that family not on same page of one way extubation and comfort care today.  They requested SBT this afternoon (which they expected this mornign- we talked about why given his FiO2 @70 % this AM).  He seemed to do fairly well on SBT this afternoon, but amount of secretions he has had is concerning. Discussed this with Judeen Hammans this afternoon.  She feels that family is processing  this information and meeting this morning did at least help some family understand and allow them to slowly get to place of acceptance.  Will plan on meeting with them tomorrow at 130 Judeen Hammans has to work from [employer]) and re-address this issues.  Given his multiple setbacks, hard to see change in clinical trajectory at this point.  I think should he continue to pass SBT in the morning we are very likely talking about further decline in days to weeks.  Desire for further Chaplaincy support:no  3. Symptom Management:  Fentanyl PRN pain/dyspnea   4. Prognosis: Likely less than 2  weeks if trajecotry continues  5. Discharge Planning: TBD   Care plan was discussed with Dr Lamonte Sakai, bedside nurse  Thank you for allowing the Palliative Medicine Team to assist in the care of this patient.   Total Time: 55 minutes Greater than 50%  of this time was spent counseling and coordinating care related to the above assessment and plan.   Doran Clay, DO  07/19/2014, 3:20 PM  Please contact Palliative Medicine Team phone at [phone] for questions and concerns.

## 2014-07-19 NOTE — Progress Notes (Signed)
PULMONARY / CRITICAL CARE MEDICINE   Name: [name] MRN: [id number] DOB: Feb 21, 1945    ADMISSION DATE:  07/22/2014 CONSULTATION DATE:  07/06/14  REFERRING MD :  Dr. Thereasa Solo   CHIEF COMPLAINT:  Cardiac Arrest   INITIAL PRESENTATION: 69 y/o M initially admitted to [facility] on 5/19 with SOB in the setting of AV insufficiency after developing a partially thrombosed mechanical AV after stopping coumadin for unclear reasons.  He was scheduled for an AVR on 5/26 but developed recurrent melena.  He underwent an EGD with clipping of gastric erosion on 5/26.  Course was complicated by DVT of the LUE, anemia, dental caries and deconditioning of critical illness.  He was transferred to [facility] on 6/3 for planned rehab prior to AVR.  6/7 the patient was taken for multiple tooth extraction.  6/10 afternoon, he suffered a VT arrest requiring ~ 2 min of CPR.  He was intubated post CPR and PCCM consulted for evaluation.   Developed enterococcal bacteremia 6/10 & AMS for severeal days, & developed brady/PEA/resp arrest 6/21, hence PCCM reconsulted   STUDIES:    SIGNIFICANT EVENTS: 5/19-6/03  Admit to [facility] for cardiogenic shock secondary to AV insufficiency, GI bleeding, enteroccocus bacteremia 5/20  Cardiac Cath >> non-obstructive disease, 10% stenosis in LAD & L circumflex  6/03-6/09  Admit to [facility] CIR for rehab.  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 [name] who initially presented to [facility] on 06/13/2014 complaining of SOB. While at  [facility], he was found to have severe aortic insufficiency and transferred to [facility] for further evaluation.  During his hospital stay he had a a GI bleed which was evaluated with EGD, LUE DVT, enterococcal bacteremia, and partially thrombosed mechanical aortic valve.  He was sent to Rehab for his deconditioning, and had   Work up was consistent with a partially thrombosed mechanical valve after coumadin was stopped for unclear reasons (coumadin was stopped 3 months prior to 5/19 admit).  He was treated with intravenous heparin. TEE completed showing ejection fraction of 60%. Only one of the leaflets could be visualized as moving movement was significantly reduced. There was severe aortic insufficiency. 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 Cardiac catheterization showed no significant CAD. Cardiothoracic surgery consulted for prosthetic valve dysfunction and need for surgical intervention. He was planned for AVR revision after rehab.  Hospital course complicated by GI bleed/melena with gastroenterology consulted 06/17/2014. EGD completed 06/18/2014 showing 2 gastric erosions without active bleeding. Patient required transfusion during hospital course. On 06/21/2014 with increased loose tarry stools EGD was repeated showing large gastric blood clot with erosion visible inferior to the GE junction. Hospital course was further complicated by an acute DVT in the left brachial and axillary veins (dx'd 06/24/2014).   He was transferred to [facility] on 6/3 for rehab efforts prior to redo aortic root replacement.  He was seen  by Dr. Enrique Sack on 6/7 for dental carries and underwent extraction on 6/9 of multiple teeth (tooth numbers 3, 4, 5, 13, 14, 21, 23, 24, 25, and 26).  He was discharged from CIR back to [facility] post-operatively for close observation.  Anticoagulation was held for procedure and through 6/10.  Notes reflected planned  AVR for 7-10 days post extraction.  On 6/10 early afternoon, he was noted to have a wide complex / VT arrest requiring approximately 2 minutes of CPR.    On 6/20 TCTS said that he was not a good surgical candidate considering the severe decompensation after the tooth extraction.  Palliative medicine was consulted.  On 6/21 he was to undergo a PEG, but this was held due to an elevated INR.  In the afternoon he had a cardiac arrest, PEA requiring CPR for 12 minutes.  He had two episodes of VT requiring cardioversion and he was intubated. He received amiodarone and epinephrine.  He was started on vasopressin and dopamine.    SUBJECTIVE:  Awake and following commands Globally weak Family has gathered to discuss plans w Palliative Care further, possibly extubate.    VITAL SIGNS: Temp:  [98 F (36.7 C)-98.7 F (37.1 C)] 98 F (36.7 C) (06/23 1126) Pulse Rate:  [65-86] 83 (06/23 0919) Resp:  [12-23] 21 (06/23 0500) BP: (117-192)/(35-120) 118/71 mmHg (06/23 0919) SpO2:  [95 %-100 %] 95 % (06/23 0919) FiO2 (%):  [70 %-100 %] 70 % (06/23 0919) Weight:  [112 kg (246 lb 14.6 oz)] 112 kg (246 lb 14.6 oz) (06/23 0500)   HEMODYNAMICS: CVP:  [17 mmHg-21 mmHg] 17 mmHg   VENTILATOR SETTINGS: Vent Mode:  [-] PRVC FiO2 (%):  [70 %-100 %] 70 % Set Rate:  [20 bmp] 20 bmp Vt Set:  [550 mL] 550 mL PEEP:  [5 cmH20] 5 cmH20 Plateau Pressure:  [26 cmH20-30 cmH20] 27 cmH20   INTAKE / OUTPUT:  Intake/Output Summary (Last 24 hours) at 07/19/14 1129 Last data filed at 07/19/14 4098  Gross per 24 hour  Intake   2328 ml  Output    700 ml  Net   1628 ml    PHYSICAL EXAMINATION: Gen - chronically ill, no distress ENT - no lesions, no post nasal drip, oral ETT, blood shot eyes Neck: No JVD, no thyromegaly, no carotid bruits Lungs:  No use of accessory muscles  decreased Rt without rales or rhonchi  Cardiovascular: Rhythm regular, heart sounds  normal, diastolic click, no peripheral edema Abdomen: soft  and non-tender, no hepatosplenomegaly, BS normal, midline hernia Musculoskeletal: No deformities, no cyanosis or clubbing Neuro:  Eyes open, follows some commands, globally weak Skin:  Warm, no lesions/ rash   LABS:  CBC  Recent Labs Lab 07/01/2014 1700 07/18/14 0500 07/19/14 0524  WBC 13.2* 10.2 11.0*  HGB 8.9* 8.1* 8.3*  HCT 29.6* 25.8* 27.0*  PLT 326 225 282   Coag's  Recent Labs Lab 07/22/2014 0540 07/10/2014 1357 07/18/14 0500  INR 2.11* 1.99* 2.05*     BMET  Recent Labs Lab 07/16/2014 0540 07/16/2014 0730 07/18/14 0500  NA 149* 149* 149*  K 3.8 4.0 3.5  CL 113* 112* 110  CO2 26 21* 27  BUN 50* 54* 53*  CREATININE 3.56* 3.87* 4.38*  GLUCOSE 125* 137* 110*   Electrolytes  Recent Labs Lab 07/15/14 0428 07/16/14 0406  07/06/2014 0540 06/30/2014 0730 07/13/2014 1700 07/18/14 0500  CALCIUM 9.7  --   < > 9.3 9.0  --  8.7*  MG  2.0 2.2  --   --   --  2.5*  --   < > = values in this interval not displayed. Sepsis Markers  Recent Labs Lab 07/26/2014 1730  LATICACIDVEN 4.3*     ABG  Recent Labs Lab 07/01/2014 2019 07/18/14 0530 07/18/14 0542  PHART 7.363 7.529* 7.551*  PCO2ART 41.4 31.9* 29.7*  PO2ART 66.0* 31.0* 75.0*     Liver Enzymes  Recent Labs Lab 06/30/2014 0730 06/29/2014 1052 07/18/14 0500  AST 39 35 33  ALT 26 25 21   ALKPHOS 45 44 39  BILITOT 1.0 1.1 1.2  ALBUMIN 3.0* 3.0* 2.3*   Cardiac Enzymes  Recent Labs Lab 06/27/2014 1700  TROPONINI 0.07*     Glucose  Recent Labs Lab 07/19/14 0218  GLUCAP 133*    Imaging No results found. ASSESSMENT / PLAN:  PULMONARY OETT 6/21 >> A: Acute Respiratory Failure - in setting of cardiac arrest (again on 6/21), may have had aspiration event vs HCAP on 6/21  R pleural effusion Acute Pulmonary edema Suspect Aspiration PNA P:   Full vent support Daily SBT/WUA, not in position to extubate for success today 6/22 Could consider R thoracentesis but I do not believe this will change outcome  significantly   CARDIOVASCULAR CVL R IJ >> d/c'd 6/10 RUE PICC 6/10 >>  OUT Rt [place] 6/21 >> A:  PEA Arrest on 6/21 > due to aspiration event? HCAP? Doubt  Aortic valve rupture (has wide pulse pressure but hypertensive now, good click) VT arrest on 9/56 Thrombosed mechanical aortic valve, needs revision but deemed not a good candidate Atrial Fibrillation -RVR Cardiogenic shock P:  Continue heparin gtt given mechanical and thrombosed aortic mechanical valve   RENAL Lab Results  Component Value Date   CREATININE 4.38* 07/18/2014   CREATININE 3.87* 07/22/2014   CREATININE 3.56* 07/11/2014    A:    AKI - worsening, family says that they do not want dialysis P:   Progressive  renal failure, supporting hemodynamics I support decision to NOT do HD  GASTROINTESTINAL A:   Recent GIB / Gastric Erosion s/p Clipping  Protein Calorie Malnutrition Dysphagia P:   PPI for SUP OGT, defer TF as we are considering withdrawal of care Monitor for bleeding with anticoagulation and recent bleed   HEMATOLOGIC A:   Anemia - s/p transfusion 6/9 DVT LUE  P:  Follow CBC Continue Heparin gtt   INFECTIOUS A:  Dental Carries s/p Extraction - 6/9, 10 teeth removed compl Enterococcal bacteremia > seen by ID HCAP? P:   BCx2 6/10 >> enterococcus BC x2 6/13 >> no growth Respiratory culture 6/21 >   Ampicillin 6/12 > 6/21 Vanc 6/21 >  Zosyn 6/21 >   ENDOCRINE CBG (last 3)   Recent Labs  07/19/14 0218  GLUCAP 133*    A:   Mild Hyperglycemia   P:   Monitor glucose on BMP  NEUROLOGIC A:   At risk for anoxic encephalopathy, but nodding to questions P:   RASS goal:  0 PRN Fentanyl Serial neuro exams  FAMILY  - Updates: Family updated at bedside daughter.   - Palliative medicine consulted 6/20, prior to second cardiac arrest. Appreciate Dr Crissie Sickles' help 6/22. He worked to clarify goals with family. We will likely withdraw care on 6/23 given poor prognosis for a meaningful   Recovery. I agree with this plan. Confirmed DNR status. Dr Deitra Mayo is planning to meet again with family and also the pt today 6/23 before any planned extubation. Not  clear to me that everyone in family is on same page. GREATLY appreciate their help here.   Independent CC time 35 minutes  Baltazar Apo, MD, PhD 07/19/2014, 11:29 AM [facility] [phone] or if no answer [phone]

## 2014-07-20 ENCOUNTER — Encounter (HOSPITAL_COMMUNITY): Payer: Self-pay | Admitting: *Deleted

## 2014-07-20 ENCOUNTER — Inpatient Hospital Stay (HOSPITAL_COMMUNITY): Payer: Commercial Managed Care - HMO

## 2014-07-20 ENCOUNTER — Encounter (HOSPITAL_COMMUNITY): Admission: RE | Disposition: E | Payer: Self-pay | Source: Ambulatory Visit | Attending: Internal Medicine

## 2014-07-20 LAB — BASIC METABOLIC PANEL
Anion gap: 9 (ref 5–15)
BUN: 48 mg/dL — AB (ref 6–20)
CHLORIDE: 103 mmol/L (ref 101–111)
CO2: 38 mmol/L — ABNORMAL HIGH (ref 22–32)
Calcium: 8.5 mg/dL — ABNORMAL LOW (ref 8.9–10.3)
Creatinine, Ser: 4.76 mg/dL — ABNORMAL HIGH (ref 0.61–1.24)
GFR calc Af Amer: 13 mL/min — ABNORMAL LOW (ref >60)
GFR calc non Af Amer: 11 mL/min — ABNORMAL LOW (ref >60)
Glucose, Bld: 130 mg/dL — ABNORMAL HIGH (ref 65–99)
POTASSIUM: 3 mmol/L — AB (ref 3.5–5.1)
Sodium: 150 mmol/L — ABNORMAL HIGH (ref 135–145)

## 2014-07-20 LAB — HEPARIN LEVEL (UNFRACTIONATED): Heparin Unfractionated: 0.53 IU/mL (ref 0.30–0.70)

## 2014-07-20 LAB — CULTURE, RESPIRATORY: CULTURE: NO GROWTH

## 2014-07-20 LAB — CULTURE, RESPIRATORY W GRAM STAIN

## 2014-07-20 LAB — CBC
HCT: 28.1 % — ABNORMAL LOW (ref 39.0–52.0)
Hemoglobin: 8.6 g/dL — ABNORMAL LOW (ref 13.0–17.0)
MCH: 26.8 pg (ref 26.0–34.0)
MCHC: 30.6 g/dL (ref 30.0–36.0)
MCV: 87.5 fL (ref 78.0–100.0)
PLATELETS: 258 10*3/uL (ref 150–400)
RBC: 3.21 MIL/uL — ABNORMAL LOW (ref 4.22–5.81)
RDW: 20.1 % — ABNORMAL HIGH (ref 11.5–15.5)
WBC: 11.9 10*3/uL — ABNORMAL HIGH (ref 4.0–10.5)

## 2014-07-20 SURGERY — EGD (ESOPHAGOGASTRODUODENOSCOPY)
Anesthesia: Monitor Anesthesia Care

## 2014-07-20 MED ORDER — MORPHINE SULFATE 25 MG/ML IV SOLN
1.0000 mg/h | INTRAVENOUS | Status: DC
  Administered 2014-07-20: 0.5 mg/h via INTRAVENOUS
  Filled 2014-07-20 (×2): qty 10

## 2014-07-20 MED ORDER — MORPHINE SULFATE 2 MG/ML IJ SOLN: 2.0000 mg | INTRAMUSCULAR | Status: DC | PRN

## 2014-07-20 MED ORDER — SODIUM CHLORIDE 0.9 % IV SOLN: INTRAVENOUS | Status: DC

## 2014-07-20 MED ORDER — MORPHINE SULFATE 2 MG/ML IJ SOLN
2.0000 mg | INTRAMUSCULAR | Status: DC | PRN
  Filled 2014-07-20: qty 5

## 2014-07-20 MED ORDER — DEXTROSE-NACL 5-0.45 % IV SOLN
INTRAVENOUS | Status: DC
  Administered 2014-07-20: 11:00:00 via INTRAVENOUS

## 2014-07-20 NOTE — Progress Notes (Addendum)
CONSULT NOTE - Follow Up Consult  Pharmacy Consult for heparin, zosyn Indication: DVT, afib, mechanical AV thrombosis; possible PNA  No Known Allergies  Patient Measurements: Height: 5' 9.5" (176.5 cm) Weight: 246 lb 0.5 oz (111.6 kg) IBW/kg (Calculated) : 71.85 Heparin Dosing Weight: 91.6lg  Vital Signs: Temp: 98.8 F (37.1 C) (06/24 0800) Temp Source: Oral (06/24 0800) BP: 158/63 mmHg (06/24 1000) Pulse Rate: 76 (06/24 1000)  Labs:  Recent Labs  07/04/2014 1357  07/02/2014 1700 07/18/14 0500  07/18/14 1930 07/19/14 0524 07/19/2014 0440  HGB  --   < > 8.9* 8.1*  --   --  8.3* 8.6*  HCT  --   < > 29.6* 25.8*  --   --  27.0* 28.1*  PLT  --   < > 326 225  --   --  282 258  LABPROT 22.5*  --   --  23.0*  --   --   --   --   INR 1.99*  --   --  2.05*  --   --   --   --   HEPARINUNFRC  --   --   --   --   < > 0.31 0.28* 0.53  CREATININE  --   --   --  4.38*  --   --   --   --   TROPONINI  --   --  0.07*  --   --   --   --   --   < > = values in this interval not displayed.  Estimated Creatinine Clearance: 20.3 mL/min (by C-G formula based on Cr of 4.38).   Medications:  Scheduled:  . antiseptic oral rinse  7 mL Mouth Rinse QID  . chlorhexidine  15 mL Mouth/Throat BID  . pantoprazole (PROTONIX) IV  40 mg Intravenous Q12H  . piperacillin-tazobactam (ZOSYN)  IV  2.25 g Intravenous 4 times per day    Assessment: 69 yo male with mech AV thrombosis, DVT and afib on heparin. Heparin was off with cardiac arrest 6/21 and has been resumed with heparin level slightly above goal (HL= 0.53). Hg= 8.6, plt= 258. Palliative care seeing-  patient is DNR with no HD and continuing medications for now.  He is also was on ampicillin for enterococcus bactermia and now zosyn for possible PNA. Per MD note to consider change back to ampicillin soon  6/11 Vanc >6/12, 6/21>> 6/24 6/11 Zosyn >6/12, 6/21>> Ceftriaxone 6/12 >6/15 Ampicillin 6/12 >6/21  6/10 BCx x 2 > Enterococcus - S Amp and  Vanc 6/13 BCx x2 - NEG 6/21 BCx x2>> ngtd 6/21 resp- ngtd  Goal of Therapy:  Heparin level 0.3-0.5 units/ml Monitor platelets by anticoagulation protocol: Yes     Plan:  -Will not change heparin rate patient is close to goal  -Daily heparin level and CBC while on heparin -No zosyn changes needed -Will follow renal function, cultures and clinical progress  Hildred Laser, Pharm D 07/07/2014 10:51 AM

## 2014-07-20 NOTE — Procedures (Signed)
Extubation Procedure Note  Patient Details:   Name: QUASEAN FRYE DOB: [birth year] MRN: [id number]   Airway Documentation:     Evaluation  O2 sats: stable throughout Complications: No apparent complications Patient did tolerate procedure well. Bilateral Breath Sounds: Rhonchi Suctioning: Airway Yes   Lamonte Sakai 07/13/2014, 5:36 PM

## 2014-07-20 NOTE — Progress Notes (Signed)
Chaplain responded to page for pt wanting to complete healthcare power of attorney. Pt very clear that he wanted his four children to be in agreement for his care, and we wrote them down individually. Pt did not want to complete living will at this time as he believes that his children know his wishes. Chaplain assisted patient by printing name, and pt son assisted in signing. Chaplain present during the entire duration of completion of advanced directive. Copies made and placed in pt room upon pt request. Page chaplain as needed.    07/22/2014 1600  Clinical Encounter Type  Visited With Patient and family together  Visit Type Follow-up;Spiritual support  Referral From Nurse  Spiritual Encounters  Spiritual Needs Emotional;Prayer  Stress Factors  Patient Stress Factors Health changes;Major life changes  [name] 07/19/2014 4:46 PM

## 2014-07-20 NOTE — Progress Notes (Signed)
SUBJECTIVE:  Awake and intubated  OBJECTIVE:   Vitals:   Filed Vitals:   07/05/2014 0400 07/09/2014 0500 06/29/2014 0600 07/06/2014 0749  BP: 100/53 119/66 141/77 145/72  Pulse: 71 74 72 79  Temp: 98.4 F (36.9 C)   98.8 F (37.1 C)  TempSrc: Oral   Oral  Resp:      Height:      Weight: 246 lb 0.5 oz (111.6 kg)     SpO2: 97% 100% 100% 96%   I&O's:   Intake/Output Summary (Last 24 hours) at 07/17/2014 0825 Last data filed at 07/08/2014 0600  Gross per 24 hour  Intake   3052 ml  Output   2510 ml  Net    542 ml   TELEMETRY: Reviewed telemetry pt in atrial fibrillation with CVR:     PHYSICAL EXAM General: Well developed, well nourished, intubated Head: Eyes PERRLA, No xanthomas.   Normal cephalic and atramatic  Lungs:   Coarse BS anteriorly Heart:   Irregularly irregular S1 S2 Pulses are 2+ & equal. Abdomen: Bowel sounds are positive, abdomen soft and non-tender without masses  Extremities:   No clubbing, cyanosis or edema.  DP +1     LABS: Basic Metabolic Panel:  Recent Labs  07/07/2014 1700 07/18/14 0500  NA  --  149*  K  --  3.5  CL  --  110  CO2  --  27  GLUCOSE  --  110*  BUN  --  53*  CREATININE  --  4.38*  CALCIUM  --  8.7*  MG 2.5*  --    Liver Function Tests:  Recent Labs  07/03/2014 1052 07/18/14 0500  AST 35 33  ALT 25 21  ALKPHOS 44 39  BILITOT 1.1 1.2  PROT 7.7 5.9*  ALBUMIN 3.0* 2.3*   No results for input(s): LIPASE, AMYLASE in the last 72 hours. CBC:  Recent Labs  07/18/14 0500 07/19/14 0524 07/11/2014 0440  WBC 10.2 11.0* 11.9*  NEUTROABS 8.6*  --   --   HGB 8.1* 8.3* 8.6*  HCT 25.8* 27.0* 28.1*  MCV 87.5 87.1 87.5  PLT 225 282 258   Cardiac Enzymes:  Recent Labs  07/24/2014 1700  TROPONINI 0.07*   BNP: Invalid input(s): POCBNP D-Dimer: No results for input(s): DDIMER in the last 72 hours. Hemoglobin A1C: No results for input(s): HGBA1C in the last 72 hours. Fasting Lipid Panel: No results for input(s): CHOL, HDL,  LDLCALC, TRIG, CHOLHDL, LDLDIRECT in the last 72 hours. Thyroid Function Tests: No results for input(s): TSH, T4TOTAL, T3FREE, THYROIDAB in the last 72 hours.  Invalid input(s): FREET3 Anemia Panel: No results for input(s): VITAMINB12, FOLATE, FERRITIN, TIBC, IRON, RETICCTPCT in the last 72 hours. Coag Panel:   Lab Results  Component Value Date   INR 2.05* 07/18/2014   INR 1.99* 07/18/2014   INR 2.11* 07/16/2014   ASSESSMENT AND PLAN  1. 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 Continue IV Heparin until decision made on Palliative Care.    2. VT Arrest/NSVT after dental extractions and now s/p bradycardic cardiac arrest for which he received CPR for 8-12 minutes with periods of PEA. Now in atrial fibrillation with CVR Nl EF by echo 5/19. Initially on pressors for hypotension but now off and hypertensive. No antiarrhythmics per EP. Now off BB.   3. Acute on  chronic diastolic heart failure: Wt has been trending up ever since 5/28 - currently up 18 lbs since then. Given Lasix last night. Patient does not want HD per nursing staff and family.   4. Enterococcal endocarditis: Abx per IM/ID.  5. Dysphagia: Remains NPO.  6. Atrial fib rate controlled and now off BB due to bradycardic arrest. On IV heparin. INR increased today but not on warfarin. ? Etiology of elevated INR. ? Heparin effect. Platelet count is normal. Recheck INR today.    7. GIB: H/H stable.  8. LUE DVT: On heparin. INR elevated for unknown etiology. LFTs are normal.   9. Hypokalemia: F/u with BMET this am  10. Acute on chronic stage III Kidney Dzs: renal function worsening after diuretics. Check BMET this am  Patient critically ill with multisystem failure. Prognosis is very poor. Recommend Palliative Care going forward. Patient  currently is no CPR but family wishes for intubuation and meds. Need to discuss further with family to proceed with palliative care.  Nothing to add from cardiac standpoint.     Sueanne Margarita, MD  07/09/2014  8:25 AM

## 2014-07-20 NOTE — Progress Notes (Signed)
Daily Progress Note   Patient Name: [name]       Date: 07/24/2014 DOB: 06/20/1945  Age: 69 y.o. MRN#: [id number] Attending Physician: Collene Gobble, MD Primary Care Physician: [name], MD Admit Date: 07/19/2014  Reason for Consultation/Follow-up: Establishing goals of care  Subjective: Intubated, wants tube out.  Family making sure he knows that tube wont be going back in and he could fail trial of extubation. Still wants tube out and be allowed to eat/drink  Length of Stay: 15 days  Current Medications: Scheduled Meds:  . antiseptic oral rinse  7 mL Mouth Rinse QID  . chlorhexidine  15 mL Mouth/Throat BID  . pantoprazole (PROTONIX) IV  40 mg Intravenous Q12H  . piperacillin-tazobactam (ZOSYN)  IV  2.25 g Intravenous 4 times per day    Continuous Infusions: . sodium chloride 10 mL/hr at 07/14/2014 0800  . dextrose 5 % and 0.45% NaCl 30 mL/hr at 07/15/2014 1100  . heparin 1,200 Units/hr (07/13/2014 0800)    PRN Meds: albuterol, aminocaproic acid, fentaNYL (SUBLIMAZE) injection, fentaNYL (SUBLIMAZE) injection, hydrALAZINE, LORazepam, metoprolol, [DISCONTINUED] ondansetron **OR** ondansetron (ZOFRAN) IV    Vital Signs: BP 181/79 mmHg  Pulse 100  Temp(Src) 98.5 F (36.9 C) (Oral)  Resp 14  Ht 5' 9.5" (1.765 m)  Wt 111.6 kg (246 lb 0.5 oz)  BMI 35.82 kg/m2  SpO2 91% SpO2: SpO2: 91 % O2 Device: O2 Device: Ventilator O2 Flow Rate: O2 Flow Rate (L/min): 2 L/min  Intake/output summary:  Intake/Output Summary (Last 24 hours) at 07/15/2014 1403 Last data filed at 07/08/2014 1336  Gross per 24 hour  Intake   3074 ml  Output   2810 ml  Net    264 ml   LBM:   Baseline Weight: Weight: 108.9 kg (240 lb 1.3 oz) Most recent weight: Weight: 111.6 kg (246 lb 0.5 oz)  Physical Exam: GEN: alert, NAD, intubated CV: tachy LUNGS: CTAB ABD: soft, NT EXT: edematous      Additional Data Reviewed: Recent Labs     07/18/14  0500  07/19/14  0524  07/26/2014  0440   07/09/2014  1140  WBC  10.2  11.0*  11.9*   --   HGB  8.1*  8.3*  8.6*   --   PLT  225  282  258   --   NA  149*   --    --   150*  BUN  53*   --    --   48*  CREATININE  4.38*   --    --   4.76*     Problem List:  Patient Active Problem List   Diagnosis Date Noted  . Hypervolemia   . Coagulopathy   . Encounter for palliative care   . Acute respiratory failure with hypoxia   . PEA (Pulseless electrical activity)   . Aortic dissection   . Hematuria   . Palliative care encounter   . Protein-calorie malnutrition, severe   . Feeding difficulties   . Multisystem organ failure   . Medically noncompliant   . Severe aortic insufficiency   . Bleeding gastrointestinal   . Hypernatremia   . Dysphagia   . Aortic valve vegetation   . Urinary tract infection due to Enterococcus 07/10/2014  . Enterococcal bacteremia   . Cardiac arrest   . Aortic valve incompetence   . H/O noncompliance with medical treatment, presenting hazards to health   . DVT of upper extremity (deep vein thrombosis)   .  NSVT (nonsustained ventricular tachycardia)   . Chronic atrial fibrillation   . Acute on chronic renal failure   . Hematemesis with nausea   . AI (aortic insufficiency)   . H/O aortic valve replacement 07/08/2014  . Positive blood cultures 07/08/2014  . Acute renal insufficiency 07/08/2014  . Essential hypertension   . Ventricular tachycardia 07/06/2014  . VT (ventricular tachycardia) 07/06/2014  . 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 Anemia 07/16/2014  . 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 Debility 06/29/2014  . Primary gout   . Gastric erosion with bleeding   . Acute respiratory failure, unspecified whether with hypoxia or hypercapnia   . Altered mental status   . Accelerated hypertension   . Aortic insufficiency   . Aortic stenosis   . Melena   . Gastric erosion   . Aortic valve insufficiency 06/14/2014  . Cardiogenic shock, possible 06/14/2014  . Hx of aortic aneurysm repair  06/14/2014  . Atrial fibrillation, do not know length of time 06/14/2014  . Elevated troponin 06/14/2014  . Positive D dimer 06/14/2014  . CHF exacerbation   . Encounter for screening colonoscopy 11/30/2012  . Duodenal ulcer 11/30/2012  . Aortic valve disorders 01/13/2010  . 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 OTHER CHEST PAIN 01/13/2010  . OTHER ACQUIRED ABSENCE OF ORGAN 01/13/2010     Palliative Care Assessment & Plan  69 yo male with AI, AKI, resp failure, cardiac arrest x2  Code Status:  DNR  Goals of Care:  Met with family including POA today.  We talked with patient and family outlined situation and decision for extubation.  Family prompted multiple times to make sure he understood that extubation may fail and he could have recurrent resp failure and at that point we would focus on keeping him comfortable. He was able to write on piece of paper his acknowledgement of this. Family feels like this is appropriate plan.  He has pulled out multiple NG tubes and family agrees that at this point we should allow him to eat/drink with known risk for aspiration.  They request extubation at 4PM to allow other family to be around able to interact with him in case he should quickly decompensate. He has done fairly well with SBT so hopefully he does well post-extubation.  They would like to continue other therapies (abx/heparin) for now and we will re-assess where things are at tomorrow.   3. Symptom Management:  Fentanyl PRN pain/dyspnea. q32mn PRN ordered.  Can continue this post extubation as fentanyl works for dyspnea. If frequent dosing needed can start continuous infusion as well.    4. Prognosis: Likely less than 2 weeks if trajecotry continues  5. Discharge Planning: TBD   Care plan was discussed with Dr BLamonte Sakai bedside nurse  Thank you for allowing the Palliative Medicine Team to assist in the care of this patient.   Total Time: 30 minutes Greater than 50% of this time  was spent counseling and coordinating care related to the above assessment and plan.    ADoran Clay DO  07/08/2014, 2:03 PM  Please contact Palliative Medicine Team phone at [phone] questions and concerns.

## 2014-07-20 NOTE — Progress Notes (Signed)
PULMONARY / CRITICAL CARE MEDICINE   Name: [name] MRN: [id number] DOB: Jul 14, 1945    ADMISSION DATE:  07/21/2014 CONSULTATION DATE:  07/06/14  REFERRING MD :  Dr. Thereasa Solo   CHIEF COMPLAINT:  Cardiac Arrest   INITIAL PRESENTATION: 69 y/o M initially admitted to [facility] on 5/19 with SOB in the setting of AV insufficiency after developing a partially thrombosed mechanical AV after stopping coumadin for unclear reasons.  He was scheduled for an AVR on 5/26 but developed recurrent melena.  He underwent an EGD with clipping of gastric erosion on 5/26.  Course was complicated by DVT of the LUE, anemia, dental caries and deconditioning of critical illness.  He was transferred to [place] on 6/3 for planned rehab prior to AVR.  6/7 the patient was taken for multiple tooth extraction.  6/10 afternoon, he suffered a VT arrest requiring ~ 2 min of CPR.  He was intubated post CPR and PCCM consulted for evaluation.   Developed enterococcal bacteremia 6/10 & AMS for severeal days, & developed brady/PEA/resp arrest 6/21, hence PCCM reconsulted  STUDIES:   SIGNIFICANT EVENTS: 5/06-21-2003  Admit to [facility] for cardiogenic shock secondary to AV insufficiency, GI bleeding, enteroccocus bacteremia 5/20  Cardiac Cath >> non-obstructive disease, 10% stenosis in LAD & L circumflex  6/03-6/09  Admit to [facility] CIR for rehab.  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 [name] who initially presented to [facility] on 06/13/2014 complaining of SOB. While at  [facility], he was found to have severe aortic insufficiency and transferred to [facility] for further evaluation.  During his hospital stay he had a a GI bleed which was evaluated with EGD, LUE DVT, enterococcal bacteremia, and partially thrombosed mechanical aortic valve.  He was sent to Rehab for his deconditioning, and had   Work up was consistent with a partially thrombosed mechanical valve after coumadin was stopped for unclear reasons (coumadin was stopped 3 months prior to 5/19 admit).  He was treated with intravenous heparin. TEE completed showing ejection fraction of 60%. Only one of the leaflets could be visualized as moving movement was significantly reduced. There was severe aortic insufficiency. 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 Cardiac catheterization showed no significant CAD. Cardiothoracic surgery consulted for prosthetic valve dysfunction and need for surgical intervention. He was planned for AVR revision after rehab.  Hospital course complicated by GI bleed/melena with gastroenterology consulted 06/17/2014. EGD completed 06/18/2014 showing 2 gastric erosions without active bleeding. Patient required transfusion during hospital course. On 06/21/2014 with increased loose tarry stools EGD was repeated showing large gastric blood clot with erosion visible inferior to the GE junction. Hospital course was further complicated by an acute DVT in the left brachial and axillary veins (dx'd 06/24/2014).   He was transferred to [facility] on 6/3 for rehab efforts prior to redo aortic root replacement.  He was seen by Dr.  Kulinski on 6/7 for dental carries and underwent extraction on 6/9 of multiple teeth (tooth numbers 3, 4, 5, 13, 14, 21, 23, 24, 25, and 26).  He was discharged from CIR back to [facility] post-operatively for close observation.  Anticoagulation was held for procedure and through 6/10.  Notes reflected planned  AVR for 7-10 days post extraction.  On 6/10 early afternoon, he was noted to have a wide complex / VT arrest requiring approximately 2 minutes of CPR.    On 6/20 TCTS said that he was not a good surgical candidate considering the severe decompensation after the tooth extraction.  Palliative medicine was consulted.  On 6/21 he was to undergo a PEG, but this was held due to an elevated INR.  In the afternoon he had a cardiac arrest, PEA requiring CPR for 12 minutes.  He had two episodes of VT requiring cardioversion and he was intubated. He received amiodarone and epinephrine.  He was started on vasopressin and dopamine.   SUBJECTIVE:  Awake and following commands Globally weak FiO2 weaned to 0.40   VITAL SIGNS: Temp:  [98 F (36.7 C)-98.8 F (37.1 C)] 98.8 F (37.1 C) (06/24 0800) Pulse Rate:  [68-95] 79 (06/24 0749) Resp:  [14-17] 14 (06/24 0800) BP: (100-168)/(45-88) 145/72 mmHg (06/24 0749) SpO2:  [95 %-100 %] 96 % (06/24 0749) FiO2 (%):  [40 %-70 %] 40 % (06/24 0800) Weight:  [111.6 kg (246 lb 0.5 oz)] 111.6 kg (246 lb 0.5 oz) (06/24 0400)   HEMODYNAMICS: CVP:  [18 mmHg-22 mmHg] 18 mmHg   VENTILATOR SETTINGS: Vent Mode:  [-] PRVC FiO2 (%):  [40 %-70 %] 40 % Set Rate:  [14 bmp-20 bmp] 14 bmp Vt Set:  [550 mL] 550 mL PEEP:  [5 cmH20] 5 cmH20 Pressure Support:  [5 cmH20] 5 cmH20 Plateau Pressure:  [6 cmH20-28 cmH20] 23 cmH20   INTAKE / OUTPUT:  Intake/Output Summary (Last 24 hours) at 07/14/2014 0902 Last data filed at 07/22/2014 0800  Gross per 24 hour  Intake   3276 ml  Output   2785 ml  Net    491 ml    PHYSICAL EXAMINATION: Gen - chronically ill, no distress ENT - no lesions, no post nasal drip, oral ETT Neck: No JVD, no thyromegaly, no carotid bruits Lungs:  No use of accessory muscles  decreased Rt without rales or rhonchi  Cardiovascular: Rhythm regular, heart sounds  normal, diastolic click, no peripheral edema Musculoskeletal: No deformities, no cyanosis or  clubbing Neuro:  Eyes open, follows some commands, globally weak Skin:  Warm, no lesions/ rash   LABS:  CBC  Recent Labs Lab 07/18/14 0500 07/19/14 0524 07/13/2014 0440  WBC 10.2 11.0* 11.9*  HGB 8.1* 8.3* 8.6*  HCT 25.8* 27.0* 28.1*  PLT 225 282 258   Coag's  Recent Labs Lab 07/03/2014 0540 07/02/2014 1357 07/18/14 0500  INR 2.11* 1.99* 2.05*     BMET  Recent Labs Lab 07/18/2014 0540 07/22/2014 0730 07/18/14 0500  NA 149* 149* 149*  K 3.8 4.0 3.5  CL 113* 112* 110  CO2 26 21* 27  BUN 50* 54* 53*  CREATININE 3.56* 3.87* 4.38*  GLUCOSE 125* 137* 110*   Electrolytes  Recent Labs Lab 07/15/14 0428 07/16/14 0406  07/15/2014 0540 06/29/2014 0730 07/11/2014 1700 07/18/14 0500  CALCIUM 9.7  --   < > 9.3 9.0  --  8.7*  MG 2.0 2.2  --   --   --  2.5*  --   < > =  values in this interval not displayed. Sepsis Markers  Recent Labs Lab 06/28/2014 1730  LATICACIDVEN 4.3*     ABG  Recent Labs Lab 07/05/2014 2019 07/18/14 0530 07/18/14 0542  PHART 7.363 7.529* 7.551*  PCO2ART 41.4 31.9* 29.7*  PO2ART 66.0* 31.0* 75.0*     Liver Enzymes  Recent Labs Lab 07/11/2014 0730 06/30/2014 1052 07/18/14 0500  AST 39 35 33  ALT 26 25 21   ALKPHOS 45 44 39  BILITOT 1.0 1.1 1.2  ALBUMIN 3.0* 3.0* 2.3*   Cardiac Enzymes  Recent Labs Lab 07/12/2014 1700  TROPONINI 0.07*     Glucose  Recent Labs Lab 07/19/14 0218  GLUCAP 133*    Imaging Dg Chest Port 1 View  07/24/2014   EXAM: PORTABLE CHEST - 1 VIEW  COMPARISON:  06/29/2014.  FINDINGS: Support apparatus: Unchanged consisting of endotracheal tube, RIGHT subclavian central line and monitoring leads.  Cardiomediastinal Silhouette:  Unchanged.  Lungs: RIGHT basilar atelectasis and RIGHT-greater-than-LEFT perihilar and basilar predominant airspace disease. No pneumothorax.  Effusions:  Unchanged RIGHT pleural effusion.  Other:  None.  IMPRESSION: 1. Stable support apparatus. 2. Unchanged RIGHT-greater-than-LEFT bilateral  airspace disease and RIGHT pleural effusion.   Electronically Signed   By: Dereck Ligas [name]   On: 07/04/2014 07:37   ASSESSMENT / PLAN:  PULMONARY OETT 6/21 >> A: Acute Respiratory Failure - in setting of cardiac arrest (again on 6/21), may have had aspiration event vs HCAP on 6/21  R pleural effusion Acute Pulmonary edema, improved Suspect Aspiration PNA P:   Full vent support Could consider R thoracentesis but I do not believe this will change outcome significantly, effusion not large Daily SBT/WUA, he may be a candidate for extubation 6/24, but I would want to have an understanding of whether he would be reintubated if he fails. At this point there are varied opinions among the many family members regarding the agressiveness of his care.   CARDIOVASCULAR CVL R IJ >> d/c'd 6/10 RUE PICC 6/10 >>  OUT Rt  6/21 >> A:  PEA Arrest on 6/21 > due to aspiration event? HCAP? Doubt  Aortic valve rupture (has wide pulse pressure but hypertensive now, good click) VT arrest on 6/76 Thrombosed mechanical aortic valve, NOT A CANDIDATE FOR REVISION BASED ON ARREST, CLINICAL STATUS Atrial Fibrillation -RVR Cardiogenic shock P:  Continue heparin gtt given mechanical and thrombosed aortic mechanical valve Off pressors, has diuresed to point of progressive renal failure and hypernatremia. Will hold lasix 6/24   RENAL Lab Results  Component Value Date   CREATININE 4.38* 07/18/2014   CREATININE 3.87* 07/10/2014   CREATININE 3.56* 07/21/2014    A:    AKI - worsening, family says that they do not want dialysis.  P:   Progressive  renal failure, supporting hemodynamics D/c lasix 6/24 Wholeheartedly agree that is is a poor candidate for HD.   GASTROINTESTINAL A:   Recent GIB / Gastric Erosion s/p Clipping  Protein Calorie Malnutrition Dysphagia P:   PPI for SUP OGT, defer TF as we are considering withdrawal of care; Family has broached subject of PEG Monitor for bleeding with  anticoagulation and recent bleed   HEMATOLOGIC A:   Anemia - s/p transfusion 6/9 DVT LUE  P:  Follow CBC Continue Heparin gtt   INFECTIOUS A:  Dental Carries s/p Extraction - 6/9, 10 teeth removed compl Enterococcal bacteremia > seen by ID HCAP? P:   BCx2 6/10 >> enterococcus BC x2 6/13 >> no growth Respiratory culture 6/21 >  Ampicillin 6/12 > 6/21 Vanc 6/21 > 6/24 Zosyn 6/21 >  All cx post-arrest are negative, will d/c vanco and consider a change back to targeted endocarditis therapy soon  ENDOCRINE A:   Mild Hyperglycemia   P:   Monitor glucose on BMP  NEUROLOGIC A:   At risk for anoxic encephalopathy, but nodding to questions P:   RASS goal:  0 PRN Fentanyl Serial neuro exams  FAMILY  - Updates: Family updated at bedside 6/23, also note Palliative care mtg as below  - Palliative medicine consulted 6/20, prior to second cardiac arrest. Have been following and discussing goals for his care since. Appreciate Dr Crissie Sickles' help 6/22, Dr Angelia Mould on 6/23 and 24. It sounds like we have confirmed DNR status and the fact that he would not want HD. Otherwise there are many disparate opinions and feelings in a large family about the appropriate agressiveness of care.  The patient will likely be able to participate in some of the discussion based on his current MS, but suspect his insight is limited. 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 We are currently managing his dying process. Family will need to decide how they want him to spend his last days / weeks. I will try to convey this message to them today 6/24. Also note that Dr Deitra Mayo has a more formal meeting set with family this pm.    Independent CC time 60 minutes  Baltazar Apo, MD, PhD 07/11/2014, 9:02  AM [facility] [phone] or if no answer [phone]

## 2014-07-21 DIAGNOSIS — I82629 Acute embolism and thrombosis of deep veins of unspecified upper extremity: Secondary | ICD-10-CM

## 2014-07-21 DIAGNOSIS — J9600 Acute respiratory failure, unspecified whether with hypoxia or hypercapnia: Secondary | ICD-10-CM | POA: Insufficient documentation

## 2014-07-21 LAB — BASIC METABOLIC PANEL
Anion gap: 10 (ref 5–15)
Anion gap: 8 (ref 5–15)
BUN: 46 mg/dL — ABNORMAL HIGH (ref 6–20)
BUN: 45 mg/dL — ABNORMAL HIGH (ref 6–20)
CO2: 36 mmol/L — ABNORMAL HIGH (ref 22–32)
CO2: 34 mmol/L — ABNORMAL HIGH (ref 22–32)
Calcium: 8.3 mg/dL — ABNORMAL LOW (ref 8.9–10.3)
Calcium: 8.5 mg/dL — ABNORMAL LOW (ref 8.9–10.3)
Chloride: 101 mmol/L (ref 101–111)
Chloride: 103 mmol/L (ref 101–111)
Creatinine, Ser: 4.6 mg/dL — ABNORMAL HIGH (ref 0.61–1.24)
Creatinine, Ser: 4.6 mg/dL — ABNORMAL HIGH (ref 0.61–1.24)
GFR calc Af Amer: 14 mL/min — ABNORMAL LOW (ref >60)
GFR calc Af Amer: 14 mL/min — ABNORMAL LOW (ref >60)
GFR calc non Af Amer: 12 mL/min — ABNORMAL LOW (ref >60)
GFR calc non Af Amer: 12 mL/min — ABNORMAL LOW (ref >60)
GLUCOSE: 104 mg/dL — AB (ref 65–99)
Glucose, Bld: 122 mg/dL — ABNORMAL HIGH (ref 65–99)
POTASSIUM: 2.9 mmol/L — AB (ref 3.5–5.1)
Potassium: 3.7 mmol/L (ref 3.5–5.1)
Sodium: 147 mmol/L — ABNORMAL HIGH (ref 135–145)
Sodium: 145 mmol/L (ref 135–145)

## 2014-07-21 LAB — CBC
HCT: 27.4 % — ABNORMAL LOW (ref 39.0–52.0)
Hemoglobin: 8.3 g/dL — ABNORMAL LOW (ref 13.0–17.0)
MCH: 26.9 pg (ref 26.0–34.0)
MCHC: 30.3 g/dL (ref 30.0–36.0)
MCV: 89 fL (ref 78.0–100.0)
Platelets: 261 10*3/uL (ref 150–400)
RBC: 3.08 MIL/uL — ABNORMAL LOW (ref 4.22–5.81)
RDW: 20 % — AB (ref 11.5–15.5)
WBC: 10.3 10*3/uL (ref 4.0–10.5)

## 2014-07-21 LAB — HEPARIN LEVEL (UNFRACTIONATED): Heparin Unfractionated: 0.45 IU/mL (ref 0.30–0.70)

## 2014-07-21 LAB — PROTIME-INR
INR: 1.6 — ABNORMAL HIGH (ref 0.00–1.49)
Prothrombin Time: 19.1 seconds — ABNORMAL HIGH (ref 11.6–15.2)

## 2014-07-21 LAB — MAGNESIUM: Magnesium: 2.2 mg/dL (ref 1.7–2.4)

## 2014-07-21 LAB — PHOSPHORUS: Phosphorus: 4.8 mg/dL — ABNORMAL HIGH (ref 2.5–4.6)

## 2014-07-21 MED ORDER — POTASSIUM CHLORIDE CRYS ER 20 MEQ PO TBCR
40.0000 meq | EXTENDED_RELEASE_TABLET | Freq: Once | ORAL | Status: AC
  Administered 2014-07-21: 40 meq via ORAL
  Filled 2014-07-21: qty 2

## 2014-07-21 MED ORDER — POTASSIUM CHLORIDE 10 MEQ/50ML IV SOLN
10.0000 meq | INTRAVENOUS | Status: AC
  Administered 2014-07-21 (×4): 10 meq via INTRAVENOUS
  Filled 2014-07-21 (×2): qty 50

## 2014-07-21 MED ORDER — HYDRALAZINE HCL 25 MG PO TABS
25.0000 mg | ORAL_TABLET | Freq: Three times a day (TID) | ORAL | Status: DC
  Administered 2014-07-21 – 2014-07-23 (×7): 25 mg via ORAL
  Filled 2014-07-21 (×9): qty 1

## 2014-07-21 MED ORDER — FUROSEMIDE 10 MG/ML IJ SOLN
80.0000 mg | Freq: Two times a day (BID) | INTRAMUSCULAR | Status: DC
  Administered 2014-07-21 – 2014-07-23 (×5): 80 mg via INTRAVENOUS
  Filled 2014-07-21 (×5): qty 8

## 2014-07-21 MED ORDER — POTASSIUM CHLORIDE CRYS ER 20 MEQ PO TBCR: 40.0000 meq | EXTENDED_RELEASE_TABLET | Freq: Once | ORAL | Status: DC

## 2014-07-21 NOTE — Progress Notes (Signed)
PULMONARY / CRITICAL CARE MEDICINE   Name: [name] MRN: [id number] DOB: 10-26-1945    ADMISSION DATE:  07/07/2014 CONSULTATION DATE:  07/06/14  REFERRING MD :  Dr. Thereasa Solo   CHIEF COMPLAINT:  Cardiac Arrest   INITIAL PRESENTATION: 69 y/o M initially admitted to [facility] on 5/19 with SOB in the setting of AV insufficiency after developing a partially thrombosed mechanical AV after stopping coumadin for unclear reasons.  He was scheduled for an AVR on 5/26 but developed recurrent melena.  He underwent an EGD with clipping of gastric erosion on 5/26.  Course was complicated by DVT of the LUE, anemia, dental caries and deconditioning of critical illness.  He was transferred to [place] on 6/3 for planned rehab prior to AVR.  6/7 the patient was taken for multiple tooth extraction.  6/10 afternoon, he suffered a VT arrest requiring ~ 2 min of CPR.  He was intubated post CPR and PCCM consulted for evaluation.   Developed enterococcal bacteremia 6/10 & AMS for severeal days, & developed brady/PEA/resp arrest 6/21, hence PCCM reconsulted  STUDIES:   SIGNIFICANT EVENTS: 5/Aug 05, 2001  Admit to [facility] for cardiogenic shock secondary to AV insufficiency, GI bleeding, enteroccocus bacteremia 5/20  Cardiac Cath >> non-obstructive disease, 10% stenosis in LAD & L circumflex  6/03-6/09  Admit to [facility] CIR for rehab.  6/9 had multiple tooth extraction and was transferred back to [facility]  6/09   Admit to [facility] after dental extraction 6/10   VT arrest, 2 min CPR, intubated & tx to ICU  6/11 Push weaning 6/20 palliative medicine consult, TCTS says not a good surgery candidate 6/21 could not place PEG due to high INR, PEA arrest 6/24: terminally extubated. Full DNR   SUBJECTIVE:  Awake and following commands Globally weak FiO2 weaned to 0.40   VITAL SIGNS: Temp:  [97.7 F (36.5 C)-98.2 F (36.8 C)] 97.9 F (36.6 C) (06/25 1146) Pulse Rate:  [62-100] 90 (06/25 1146) Resp:  [20] 20 (06/25 1146) BP: (145-209)/(55-95)  175/95 mmHg (06/25 1100) SpO2:  [86 %-100 %] 100 % (06/25 1146) FiO2 (%):  [40 %] 40 % (06/24 1545) Weight:  [101.4 kg (223 lb 8.7 oz)] 101.4 kg (223 lb 8.7 oz) (06/25 0400)   HEMODYNAMICS:     VENTILATOR SETTINGS: Vent Mode:  [-] PSV;CPAP FiO2 (%):  [40 %] 40 % PEEP:  [5 cmH20] 5 cmH20 Pressure Support:  [10 cmH20] 10 cmH20   INTAKE / OUTPUT:  Intake/Output Summary (Last 24 hours) at 07/21/14 1237 Last data filed at 07/21/14 1100  Gross per 24 hour  Intake 1312.17 ml  Output   1405 ml  Net -92.83 ml    PHYSICAL EXAMINATION: Gen - chronically ill, no distress ENT - no lesions, no post nasal drip, oral ETT Neck: No JVD, no thyromegaly, no carotid bruits Lungs:  No use of accessory muscles  decreased Rt without rales or rhonchi  Cardiovascular: Rhythm regular, heart sounds  normal, diastolic click, no peripheral edema Musculoskeletal: No deformities, no cyanosis or clubbing Neuro:  Eyes open, follows some commands, globally weak Skin:  Warm, no lesions/ rash   LABS:  CBC  Recent Labs Lab 07/19/14 0524 07/10/2014 0440 07/21/14 0506  WBC 11.0* 11.9* 10.3  HGB 8.3* 8.6* 8.3*  HCT 27.0* 28.1* 27.4*  PLT 282 258 261   Coag's  Recent Labs Lab 07/19/2014 1357 07/18/14 0500 07/21/14 0500  INR 1.99* 2.05* 1.60*     BMET  Recent Labs Lab 07/18/14 0500 07/22/2014 1140  07/21/14 0506  NA 149* 150* 147*  K 3.5 3.0* 2.9*  CL 110 103 101  CO2 27 38* 36*  BUN 53* 48* 46*  CREATININE 4.38* 4.76* 4.60*  GLUCOSE 110* 130* 104*   Electrolytes  Recent Labs Lab 07/16/14 0406  07/21/2014 1700 07/18/14 0500 07/02/2014 1140 07/21/14 0506  CALCIUM  --   < >  --  8.7* 8.5* 8.3*  MG 2.2  --  2.5*  --   --  2.2  PHOS  --   --   --   --   --  4.8*  < > = values in this interval not displayed. Sepsis Markers  Recent Labs Lab 07/22/2014 1730  LATICACIDVEN 4.3*     ABG  Recent Labs Lab 07/26/2014 2019 07/18/14 0530 07/18/14 0542  PHART 7.363 7.529* 7.551*  PCO2ART  41.4 31.9* 29.7*  PO2ART 66.0* 31.0* 75.0*     Liver Enzymes  Recent Labs Lab 07/12/2014 0730 07/16/2014 1052 07/18/14 0500  AST 39 35 33  ALT 26 25 21   ALKPHOS 45 44 39  BILITOT 1.0 1.1 1.2  ALBUMIN 3.0* 3.0* 2.3*   Cardiac Enzymes  Recent Labs Lab 07/10/2014 1700  TROPONINI 0.07*     Glucose  Recent Labs Lab 07/19/14 0218  GLUCAP 133*    Imaging No results found. ASSESSMENT / PLAN:   Acute Respiratory Failure - in setting of cardiac arrest (again on 6/21), may have had aspiration event vs HCAP on 6/21  R pleural effusion Terminally extubated 6/24 Plan O2 Comfort feeds Dnr/dni   PEA Arrest on 6/21 > due to aspiration event? HCAP? Doubt  Aortic valve rupture (has wide pulse pressure but hypertensive now, good click) VT arrest on 1/19 Thrombosed mechanical aortic valve, NOT A CANDIDATE FOR REVISION BASED ON ARREST, CLINICAL STATUS Atrial Fibrillation -RVR Diastolic HF w/ volume overload  Plan -Lasix -Heparin gtt for now, but would d/c this when/if he clinically declines  AKI - worsening, family says that they do not want dialysis Hypokalemia  Plan -Trend renal fxn w/ Diuresis  -F/u bmp -Change MIVF to D5w at [facility]  Recent GIB / Gastric Erosion s/p Clipping  Plan -Watch w/ heparin  -Cont PPI   Protein Calorie Malnutrition Dysphagia Dental Carries s/p Extraction - 6/9, 10 teeth removed compl Plan -Diet at tolerated  -Accepts risk of aspiration   Enterococcal bacteremia > seen by ID Plan -Continue zosyn    Anemia - s/p transfusion 6/9 DVT LUE  P:   -Cont heparin gtt     Now full DNR. Medically treating heart failure, endocarditis, AF, and volume overload. Now extubated. He has severe dysphagia but based on family discussion wants to eat and understands that this will shorten his life. At this point we will cont what we are doing. If he declines will begin to cut back interventions such as heparin gtt and abx but otherwise for no change. He  will eventually transition to full comfort.  Erick Colace ACNP-BC [place] Pager # [phone] OR # [phone] if no answer  STAFF NOTE: I, Merrie Roof, MD FACP have personally reviewed patient's available data, including medical history, events of note, physical examination and test results as part of my evaluation. I have discussed with resident/NP and other care providers such as pharmacist, RN and RRT. In addition, I personally evaluated patient and elicited key findings of: extubated, appears well, low dose morphine noted, may be able to dc this, monitor pain with prn morphine, RR if  declines then back to drip, heparin for now unless bleeding noted, would NOT escalate O2 if declines would prolong suffering, abx noted, suction as needed, would maintain lasix to neg balance goals with bmets  Lavon Paganini. Titus Mould, MD, [place] Pgr: [facility] [facility] 07/21/2014 12:58 PM

## 2014-07-21 NOTE — Progress Notes (Signed)
Report received from [facility] RN . Pt to go to a regular bed instead of telemetry per report.

## 2014-07-21 NOTE — Progress Notes (Signed)
Met with family today briefly.  They have talked with CCM team and feel like they understand situation better.  Ongoing current levels of therapy but not escalating care. He was happy to have water and they will try to get him strawberry milkshake. Agree with CCM plan on morphine drip as outlined today.  Will continue to monitor situation. He is of course very high risk for acute decline.  Appears comfortable today.   Doran Clay D.O. Palliative Medicine Team at [facility]  Pager: [phone] Team Phone: [phone]

## 2014-07-21 NOTE — Progress Notes (Signed)
ANTICOAGULATION CONSULT NOTE - Follow Up Consult  Pharmacy Consult for Heparin Indication: DVT, afib, mechanical AV thrombosis  No Known Allergies  Patient Measurements: Height: 5' 9.5" (176.5 cm) Weight: 223 lb 8.7 oz (101.4 kg) IBW/kg (Calculated) : 71.85 Heparin Dosing Weight: 91 kg  Vital Signs: Temp: 97.9 F (36.6 C) (06/25 1146) Temp Source: Oral (06/25 1146) BP: 175/95 mmHg (06/25 1100) Pulse Rate: 90 (06/25 1146)  Labs:  Recent Labs  07/19/14 0524 07/01/2014 0440 06/29/2014 1140 07/21/14 0500 07/21/14 0506  HGB 8.3* 8.6*  --   --  8.3*  HCT 27.0* 28.1*  --   --  27.4*  PLT 282 258  --   --  261  LABPROT  --   --   --  19.1*  --   INR  --   --   --  1.60*  --   HEPARINUNFRC 0.28* 0.53  --  0.45  --   CREATININE  --   --  4.76*  --  4.60*    Estimated Creatinine Clearance: 18.4 mL/min (by C-G formula based on Cr of 4.6).  Assessment:  69 yo male with mech AV thrombosis, DVT and afib on heparin.  Heparin was off with cardiac arrest 6/21, resumed 6/22.   Heparin level is therapeutic (0.45) on 1200 units/hr.  Hgb low stable. No bleeding noted.  Goal of Therapy:  Heparin level 0.3-0.5 units/ml, lower end of therapeutic range Monitor platelets by anticoagulation protocol: Yes   Plan:   Continue heparin drip at 1200 units/hr.  Daily heparin level and CBC.  Arty Baumgartner, [place] Pager: [phone] 07/21/2014,11:53 AM

## 2014-07-21 NOTE — Progress Notes (Signed)
Patient ID: [name], male   DOB: January 29, 1945, 69 y.o.   MRN: [id number]   69 y/o M initially admitted to [facility] on 5/19 with SOB in the setting of AV insufficiency after developing a partially thrombosed mechanical AoV after stopping coumadin for unclear reasons. He was scheduled for an AVR on 5/26 but developed recurrent melena. He underwent an EGD with clipping of gastric erosion on 5/26. Course was complicated by DVT of the LUE, anemia, dental caries and deconditioning of critical illness. He was transferred to [facility] on 6/3 for planned rehab prior to AVR. 6/7 the patient was taken for multiple tooth extraction. 6/10 afternoon, he suffered a VT arrest requiring ~ 2 min of CPR. He was intubated post CPR. Developed enterococcal bacteremia 6/10 & AMS for several days, then developed brady/PEA/resp arrest 6/21.    Extubated on 6/24.  Awake and alert this morning, wants ice.  HR stable in atrial fibrillation, creatinine stable, BP elevated.   Scheduled Meds: . antiseptic oral rinse  7 mL Mouth Rinse QID  . chlorhexidine  15 mL Mouth/Throat BID  . furosemide  80 mg Intravenous BID  . hydrALAZINE  25 mg Oral 3 times per day  . pantoprazole (PROTONIX) IV  40 mg Intravenous Q12H  . piperacillin-tazobactam (ZOSYN)  IV  2.25 g Intravenous 4 times per day  . potassium chloride  10 mEq Intravenous Q1 Hr x 4  . potassium chloride  40 mEq Oral Once   Continuous Infusions: . sodium chloride 10 mL/hr at 07/02/2014 2000  . dextrose 5 % and 0.45% NaCl 30 mL/hr at 06/27/2014 2000  . heparin 1,200 Units/hr (07/25/2014 2000)  . morphine 0.5 mg/hr (07/09/2014 2000)   PRN Meds:.albuterol, fentaNYL (SUBLIMAZE) injection, fentaNYL (SUBLIMAZE) injection, hydrALAZINE, LORazepam, metoprolol, morphine injection, [DISCONTINUED] ondansetron **OR** ondansetron (ZOFRAN) IV   OBJECTIVE:   Vitals:   Filed Vitals:   07/21/14 0500 07/21/14 0600 07/21/14 0700 07/21/14 0740  BP: 147/61 168/79 165/78   Pulse: 80 87 84    Temp:    97.7 F (36.5 C)  TempSrc:    Oral  Resp:      Height:      Weight:      SpO2: 99% 100% 100%    I&O's:    Intake/Output Summary (Last 24 hours) at 07/21/14 4098 Last data filed at 07/21/14 1191  Gross per 24 hour  Intake 1610.17 ml  Output   1430 ml  Net 180.17 ml   TELEMETRY: Reviewed telemetry pt in atrial fibrillation with controlled rate   PHYSICAL EXAM General: Well developed, well nourished Neck: JVP 12 cm Lungs:   Rhonchi bilaterally Heart:   Irregularly irregular S1 S2, mechanical S2, 2/6 SEM RUSB Abdomen: Bowel sounds are positive, abdomen soft and non-tender without masses  Extremities:   No clubbing, cyanosis. 1+ edema to knees bilaterally  LABS: Basic Metabolic Panel:  Recent Labs  07/18/2014 1140 07/21/14 0506  NA 150* 147*  K 3.0* 2.9*  CL 103 101  CO2 38* 36*  GLUCOSE 130* 104*  BUN 48* 46*  CREATININE 4.76* 4.60*  CALCIUM 8.5* 8.3*  MG  --  2.2  PHOS  --  4.8*   Liver Function Tests: No results for input(s): AST, ALT, ALKPHOS, BILITOT, PROT, ALBUMIN in the last 72 hours. No results for input(s): LIPASE, AMYLASE in the last 72 hours. CBC:  Recent Labs  06/30/2014 0440 07/21/14 0506  WBC 11.9* 10.3  HGB 8.6* 8.3*  HCT 28.1* 27.4*  MCV 87.5  89.0  PLT 258 261   Cardiac Enzymes: No results for input(s): CKTOTAL, CKMB, CKMBINDEX, TROPONINI in the last 72 hours. BNP: Invalid input(s): POCBNP D-Dimer: No results for input(s): DDIMER in the last 72 hours. Hemoglobin A1C: No results for input(s): HGBA1C in the last 72 hours. Fasting Lipid Panel: No results for input(s): CHOL, HDL, LDLCALC, TRIG, CHOLHDL, LDLDIRECT in the last 72 hours. Thyroid Function Tests: No results for input(s): TSH, T4TOTAL, T3FREE, THYROIDAB in the last 72 hours.  Invalid input(s): FREET3 Anemia Panel: No results for input(s): VITAMINB12, FOLATE, FERRITIN, TIBC, IRON, RETICCTPCT in the last 72 hours. Coag Panel:   Lab Results  Component Value Date    INR 1.60* 07/21/2014   INR 2.05* 07/18/2014   INR 1.99* 07/05/2014   ASSESSMENT AND PLAN 1. Partially thrombosed mechanical aortic valve: At this point, patient does not appear to be a candidate for redo AVR. He is actually more stable now than in the past. Will continue IV heparin gtt for now.  2.VT Arrest after dental extractions: Off beta blockade/amiodarone with bradycardic PEA.   3. S/p bradycardic arrest for which he received CPR for 8-12 minutes with periods of PEA. Off nodal blockade.  4. Atrial fibrillation: Stable rate on no nodal blockers.  He is on heparin gtt.   5.Acute on chronic diastolic heart failure: EF 50-55% on last echo.  Patient is volume overloaded on exam.  Creatinine is elevated but stable.  - Lasix 80 mg IV bid, carefully replace K. 6. Enterococcal bacteremia: On Zosyn.  7. GIB: H/H stable. 8. LUE DVT: On heparin gtt.   9. AKI: stable creatinine over the last day.  As above, he is quite volume overloaded.  Will add Lasix 80 mg IV bid.  10.  Palliative care has been working with patient and family.   35 minutes critical care time.    Loralie Champagne, MD  07/21/2014  9:18 AM

## 2014-07-22 LAB — BASIC METABOLIC PANEL
Anion gap: 11 (ref 5–15)
BUN: 43 mg/dL — AB (ref 6–20)
CALCIUM: 8.8 mg/dL — AB (ref 8.9–10.3)
CHLORIDE: 101 mmol/L (ref 101–111)
CO2: 35 mmol/L — AB (ref 22–32)
Creatinine, Ser: 4.32 mg/dL — ABNORMAL HIGH (ref 0.61–1.24)
GFR calc Af Amer: 15 mL/min — ABNORMAL LOW (ref >60)
GFR calc non Af Amer: 13 mL/min — ABNORMAL LOW (ref >60)
Glucose, Bld: 100 mg/dL — ABNORMAL HIGH (ref 65–99)
POTASSIUM: 3.2 mmol/L — AB (ref 3.5–5.1)
Sodium: 147 mmol/L — ABNORMAL HIGH (ref 135–145)

## 2014-07-22 LAB — CBC
HEMATOCRIT: 28.2 % — AB (ref 39.0–52.0)
HEMOGLOBIN: 8.4 g/dL — AB (ref 13.0–17.0)
MCH: 26.8 pg (ref 26.0–34.0)
MCHC: 29.8 g/dL — ABNORMAL LOW (ref 30.0–36.0)
MCV: 90.1 fL (ref 78.0–100.0)
Platelets: 254 10*3/uL (ref 150–400)
RBC: 3.13 MIL/uL — ABNORMAL LOW (ref 4.22–5.81)
RDW: 20.1 % — ABNORMAL HIGH (ref 11.5–15.5)
WBC: 10.7 10*3/uL — ABNORMAL HIGH (ref 4.0–10.5)

## 2014-07-22 LAB — CULTURE, BLOOD (ROUTINE X 2)
CULTURE: NO GROWTH
Culture: NO GROWTH

## 2014-07-22 LAB — HEPARIN LEVEL (UNFRACTIONATED): Heparin Unfractionated: 0.46 IU/mL (ref 0.30–0.70)

## 2014-07-22 MED ORDER — POTASSIUM CHLORIDE CRYS ER 20 MEQ PO TBCR
40.0000 meq | EXTENDED_RELEASE_TABLET | Freq: Four times a day (QID) | ORAL | Status: AC
  Administered 2014-07-22 – 2014-07-23 (×3): 40 meq via ORAL
  Filled 2014-07-22 (×3): qty 2

## 2014-07-22 MED ORDER — ASPIRIN EC 81 MG PO TBEC
81.0000 mg | DELAYED_RELEASE_TABLET | Freq: Every day | ORAL | Status: DC
  Administered 2014-07-22: 81 mg via ORAL
  Filled 2014-07-22: qty 1

## 2014-07-22 MED ORDER — SODIUM CHLORIDE 0.9 % IJ SOLN
10.0000 mL | INTRAMUSCULAR | Status: DC | PRN
  Administered 2014-07-22: 10 mL
  Filled 2014-07-22: qty 40

## 2014-07-22 NOTE — Progress Notes (Signed)
Daily Progress Note   Patient Name: [name]       Date: 07/22/2014 DOB: 1945/12/09  Age: 69 y.o. MRN#: [id number] Attending Physician: Kelvin Cellar, MD Primary Care Physician: [name], MD Admit Date: 07/04/2014  Reason for Consultation/Follow-up: Establishing goals of care  Subjective: Little more fatigued today. Denies pain or dyspnea.  No other complaints at this time.    Length of Stay: 17 days  Current Medications: Scheduled Meds:  . antiseptic oral rinse  7 mL Mouth Rinse QID  . aspirin EC  81 mg Oral Daily  . chlorhexidine  15 mL Mouth/Throat BID  . furosemide  80 mg Intravenous BID  . hydrALAZINE  25 mg Oral 3 times per day  . pantoprazole (PROTONIX) IV  40 mg Intravenous Q12H  . piperacillin-tazobactam (ZOSYN)  IV  2.25 g Intravenous 4 times per day    Continuous Infusions: . sodium chloride 10 mL/hr at 07/21/14 0800  . dextrose 5 % and 0.45% NaCl 30 mL/hr at 07/21/14 0800  . heparin 1,200 Units/hr (07/21/14 1500)  . morphine 1 mg/hr (07/22/14 0647)    PRN Meds: albuterol, LORazepam, metoprolol, morphine injection, [DISCONTINUED] ondansetron **OR** ondansetron (ZOFRAN) IV, sodium chloride  Palliative Performance Scale: 20%     Vital Signs: BP 132/94 mmHg  Pulse 82  Temp(Src) 97.7 F (36.5 C) (Oral)  Resp 20  Ht 5' 9.5" (1.765 m)  Wt 112.9 kg (248 lb 14.4 oz)  BMI 36.24 kg/m2  SpO2 95% SpO2: SpO2: 95 % O2 Device: O2 Device: Nasal Cannula O2 Flow Rate: O2 Flow Rate (L/min): 4 L/min  Intake/output summary:  Intake/Output Summary (Last 24 hours) at 07/22/14 1259 Last data filed at 07/22/14 0640  Gross per 24 hour  Intake    380 ml  Output   1875 ml  Net  -1495 ml   LBM:   Baseline Weight: Weight: 108.9 kg (240 lb 1.3 oz) Most recent weight: Weight: 112.9 kg (248 lb 14.4 oz)  Physical Exam: GEN: easy to arouse, NAD, little diaphoretic CV: reg rate ABD: soft Ext: edematous        Additional Data Reviewed: Recent Labs   07/21/14  0506  07/21/14  1526  07/22/14  0634  WBC  10.3   --   10.7*  HGB  8.3*   --   8.4*  PLT  261   --   254  NA  147*  145  147*  BUN  46*  45*  43*  CREATININE  4.60*  4.60*  4.32*     Problem List:  Patient Active Problem List   Diagnosis Date Noted  . Acute respiratory failure   . Hypervolemia   . Coagulopathy   . Encounter for palliative care   . Acute respiratory failure with hypoxia   . PEA (Pulseless electrical activity)   . Aortic dissection   . Hematuria   . Palliative care encounter   . Protein-calorie malnutrition, severe   . Feeding difficulties   . Multisystem organ failure   . Medically noncompliant   . Severe aortic insufficiency   . Bleeding gastrointestinal   . Hypernatremia   . Dysphagia   . Aortic valve vegetation   . Urinary tract infection due to Enterococcus 07/10/2014  . Enterococcal bacteremia   . Cardiac arrest   . Aortic valve incompetence   . H/O noncompliance with medical treatment, presenting hazards to health   . DVT of upper extremity (deep vein thrombosis)   . NSVT (  nonsustained ventricular tachycardia)   . Chronic atrial fibrillation   . Acute on chronic renal failure   . Hematemesis with nausea   . AI (aortic insufficiency)   . H/O aortic valve replacement 07/08/2014  . Positive blood cultures 07/08/2014  . Acute renal insufficiency 07/08/2014  . Essential hypertension   . Ventricular tachycardia 07/06/2014  . VT (ventricular tachycardia) 07/06/2014  . Chronic periodontitis 07/15/2014  . Anemia 07/22/2014  . 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 Debility 06/29/2014  . Primary gout   . Gastric erosion with bleeding   . Acute respiratory failure, unspecified whether with hypoxia or hypercapnia   . Altered mental status   . Accelerated hypertension   . Aortic insufficiency   . Aortic stenosis   . Melena   . Gastric erosion   . Aortic valve insufficiency 06/14/2014  . Cardiogenic shock, possible 06/14/2014  . Hx  of aortic aneurysm repair 06/14/2014  . Atrial fibrillation, do not know length of time 06/14/2014  . Elevated troponin 06/14/2014  . Positive D dimer 06/14/2014  . CHF exacerbation   . Encounter for screening colonoscopy 11/30/2012  . Duodenal ulcer 11/30/2012  . Aortic valve disorders 01/13/2010  . 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 OTHER CHEST PAIN 01/13/2010  . OTHER ACQUIRED ABSENCE OF ORGAN 01/13/2010     Palliative Care Assessment & Plan  69 yo male with AI, AKI, resp failure, cardiac arrest x2  Code Status:  DNR  Goals of Care:  Diuresed some overnight.  Prognosis still poor as no fix for his valvular disease.  Renal function slightly improved with diuresis.  No family at bedside this afternoon. i will re-engage with them tomorrow. As this trial of therapies goes on, they may consider switch to full comfort measures and hospice care.  I think a lot of family have been at different places with this process, so will see where they are at.  Some space to think about these things can be helpful and hopefully the weekend is providing them that.   3. Symptom Management:  Pain/Dyspnea- On morphine infusion at 1mg /hr, increased from 0.5mg /hr. Still low dose but unclear to me why this was increased. Infusion should never be titrated for acute symptoms, this is what bolus dosing is for.  Infusion should only be titrated up for frequent bolus needs.     4. Prognosis: I still suspect the most likely situation to be <2 weeks  5. Discharge Planning: TBD  Total Time; 15 minutes Greater than 50%  of this time was spent counseling and coordinating care related to the above assessment and plan.   Doran Clay, DO  07/22/2014, 12:59 PM  Please contact Palliative Medicine Team phone at [phone] for questions and concerns.

## 2014-07-22 NOTE — Progress Notes (Signed)
[facility] PROGRESS NOTE  [name] QJJ:[id number] DOB: February 09, 1945 DOA: 07/07/2014 PCP: [name], MD  Assessment/Plan: 1. Acute respiratory failure. -Patient admitted to [facility]/14/2014 presenting with complaints of shortness of breath and found to have uric valve insufficiency. -He underwent rapid sequence intubation after going into PEA arrest -Chest x-ray on July 27, 2014 showed large right pleural effusion -It is possible he may have aspirated during code. -He was extubated on 07/24/2014. -Remains on Zosyn 2.25 g IV every 6 hours.  2.  Mechanical aortic valve replacement, having partial thrombosis -During his hospitalization he has had multiple complications including GI bleed, PEA arrest, respiratory failure [place] and poor candidate for undergoing revision. -He remains on a heparin drip. Audiology recommending starting aspirin.  3.  Bradycardia/PEA arrest. -Patient coded on 2014/07/27 -Telemetry showing stable heart rates -Holding nodal agents  4.  Upper GI bleed. -Patient undergoing upper endoscopy found to have gastric erosion undergoing clipping. -He is on heparin drip for mechanical valve -Remains stable, hemoglobin of 8.4. He does not have further episodes of GI bleed  5.  Acute kidney injury. -Patient having normal renal function on 07/06/2014, getting as high as 4.76 on 07/17/2014 and has been slowly trending down. -Patient does not want hemodialysis.  6.  Atrial fibrillation. -Given bradycardia and PEA arrest cardiology has recommended avoiding nodal agents, remains on IV heparin.  7.  Hypokalemia. - Likely secondary to IV Lasix  -A.m. labs showing potassium of 3.2, will replace with oral potassium replacement.  8.  Acute on chronic diastolic congestive heart failure. -Patient currently on Lasix 80 mg IV twice a day with kidney function slowly improving. -Had urine output of 2.175 in the past 24 hours however still at fluid balance of  9 L  Code Status: DO NOT RESUSCITATE Family Communication: Spoke to his daughter was present at bedside Disposition Plan: Continue supportive care   Consultants:  Infectious disease  Cardiology  PCCM  Cardiothoracic surgery  Antibiotics:  Zosyn  HPI/Subjective: Patient is a 69 year old gentleman with a history of aortic valve insufficiency status post urinary valve replacement with [employer] mechanical valve, hospitalized between 06/13/2014 and 06/29/2014 presenting with cardiogenic shock and malfunctioning valve related to thrombosis. Unfortunately he had stopped Coumadin for unclear reasons. It appears that he is scheduled for aortic valve replacement on 06/21/2014 however developed GI bleed. Patient was seen by GI undergoing EGD with clipping of gastric erosion on 06/21/2014. He was discharged to inpatient rehabilitation in stable condition. On 07/26/2014 he underwent dental procedure and multiple extractions in preparation for aortic valve replacements in future. On 07/06/2014 he went into ventricular tachycardia, arrested, CPR administered for 2 minutes. He underwent rapid sequence intubation. He was readmitted to the intensive care unit. Hospitalization complicated by development of enterococcal bacteremia, acute encephalopathy, went into PEA arrest on 27-Jul-2014. Patient now found to be candidate for revision based on arrest, clinical status, GI bleeds. He was terminally extubated on 07/12/2014. Transfer to the floor on 07/21/2014 under the care of medicine. Palliative care following.    SIGNIFICANT EVENTS: 5/19-6/03 Admit to [facility] for cardiogenic shock secondary to AV insufficiency, GI bleeding, enteroccocus bacteremia 5/20 Cardiac Cath >> non-obstructive disease, 10% stenosis in LAD & L circumflex  6/03-6/09 Admit to [facility] for rehab. 6/9 had multiple tooth extraction and was transferred back to [facility]  6/09 Admit to [facility], [facility], [facility] after dental extraction 6/10 VT arrest, 2 min CPR,  intubated & tx to ICU  6/11 Push weaning 6/20 palliative medicine consult, TCTS  says not a good surgery candidate 6/21 could not place PEG due to high INR, PEA arrest 6/24: terminally extubated. Full DNR  Objective: Filed Vitals:   07/22/14 0500  BP: 132/94  Pulse: 82  Temp: 97.7 F (36.5 C)  Resp: 20    Intake/Output Summary (Last 24 hours) at 07/22/14 1550 Last data filed at 07/22/14 0640  Gross per 24 hour  Intake  172.5 ml  Output   1500 ml  Net -1327.5 ml   Filed Weights   07/21/2014 0400 07/21/14 0400 07/22/14 0500  Weight: 111.6 kg (246 lb 0.5 oz) 101.4 kg (223 lb 8.7 oz) 112.9 kg (248 lb 14.4 oz)    Exam:   General:  Patient does not seem to be in acute distress, he is awake and alert following commands on multiple drips  Cardiovascular: Mechanical valve click,  3/6 systolic ejection murmur, irregular rate and rhythm  Respiratory: Coarse respiratory sounds, but lateral crackles  Abdomen: Soft nontender nondistended  Musculoskeletal: 1+ bilateral extremity pitting edema  Data Reviewed: Basic Metabolic Panel:  Recent Labs Lab 07/16/14 0406  07/15/2014 1700 07/18/14 0500 07/21/2014 1140 07/21/14 0506 07/21/14 1526 07/22/14 0634  NA  --   < >  --  149* 150* 147* 145 147*  K  --   < >  --  3.5 3.0* 2.9* 3.7 3.2*  CL  --   < >  --  110 103 101 103 101  CO2  --   < >  --  27 38* 36* 34* 35*  GLUCOSE  --   < >  --  110* 130* 104* 122* 100*  BUN  --   < >  --  53* 48* 46* 45* 43*  CREATININE  --   < >  --  4.38* 4.76* 4.60* 4.60* 4.32*  CALCIUM  --   < >  --  8.7* 8.5* 8.3* 8.5* 8.8*  MG 2.2  --  2.5*  --   --  2.2  --   --   PHOS  --   --   --   --   --  4.8*  --   --   < > = values in this interval not displayed. Liver Function Tests:  Recent Labs Lab 07/12/2014 0730 07/11/2014 1052 07/18/14 0500  AST 39 35 33  ALT 26 25 21   ALKPHOS 45 44 39  BILITOT 1.0 1.1 1.2  PROT 7.2 7.7 5.9*  ALBUMIN 3.0* 3.0* 2.3*   No results for input(s): LIPASE, AMYLASE  in the last 168 hours. No results for input(s): AMMONIA in the last 168 hours. CBC:  Recent Labs Lab 07/16/14 0406 07/25/2014 0540  07/18/14 0500 07/19/14 0524 07/14/2014 0440 07/21/14 0506 07/22/14 0634  WBC 8.2 8.8  < > 10.2 11.0* 11.9* 10.3 10.7*  NEUTROABS 6.3 6.2  --  8.6*  --   --   --   --   HGB 8.8* 8.7*  < > 8.1* 8.3* 8.6* 8.3* 8.4*  HCT 29.2* 28.7*  < > 25.8* 27.0* 28.1* 27.4* 28.2*  MCV 91.5 89.7  < > 87.5 87.1 87.5 89.0 90.1  PLT 339 330  < > 225 282 258 261 254  < > = values in this interval not displayed. Cardiac Enzymes:  Recent Labs Lab 07/20/2014 1700  TROPONINI 0.07*   BNP (last 3 results)  Recent Labs  06/14/14 2105  BNP 1438.6*    ProBNP (last 3 results) No results for input(s): PROBNP in the last  8760 hours.  CBG:  Recent Labs Lab 07/19/14 0218  GLUCAP 133*    Recent Results (from the past 240 hour(s))  Culture, blood (routine x 2)     Status: None   Collection Time: 07/10/2014  7:16 PM  Result Value Ref Range Status   Specimen Description BLOOD CENTRAL LINE  Final   Special Requests BOTTLES DRAWN AEROBIC AND ANAEROBIC 10CC  Final   Culture NO GROWTH 5 DAYS  Final   Report Status 07/22/2014 FINAL  Final  Culture, blood (routine x 2)     Status: None   Collection Time: 07/19/2014  8:00 PM  Result Value Ref Range Status   Specimen Description BLOOD RIGHT HAND  Final   Special Requests BOTTLES DRAWN AEROBIC ONLY 6CC  Final   Culture NO GROWTH 5 DAYS  Final   Report Status 07/22/2014 FINAL  Final  Culture, respiratory (NON-Expectorated)     Status: None   Collection Time: 07/06/2014  8:33 PM  Result Value Ref Range Status   Specimen Description TRACHEAL ASPIRATE  Final   Special Requests NONE  Final   Gram Stain   Final    RARE WBC PRESENT, PREDOMINANTLY PMN NO SQUAMOUS EPITHELIAL CELLS SEEN NO ORGANISMS SEEN Performed at [facility]    Culture   Final    NO GROWTH 2 DAYS Performed at [facility]    Report Status  07/19/2014 FINAL  Final     Studies: No results found.  Scheduled Meds: . antiseptic oral rinse  7 mL Mouth Rinse QID  . aspirin EC  81 mg Oral Daily  . chlorhexidine  15 mL Mouth/Throat BID  . furosemide  80 mg Intravenous BID  . hydrALAZINE  25 mg Oral 3 times per day  . pantoprazole (PROTONIX) IV  40 mg Intravenous Q12H  . piperacillin-tazobactam (ZOSYN)  IV  2.25 g Intravenous 4 times per day   Continuous Infusions: . sodium chloride 10 mL/hr at 07/21/14 0800  . dextrose 5 % and 0.45% NaCl 30 mL/hr at 07/21/14 0800  . heparin 1,200 Units/hr (07/22/14 1353)  . morphine 1 mg/hr (07/22/14 4010)    Principal Problem:   Positive blood cultures Active Problems:   Aortic valve insufficiency   Hx of aortic aneurysm repair   Atrial fibrillation, do not know length of time   Gastric erosion   Acute respiratory failure, unspecified whether with hypoxia or hypercapnia   Anemia   Ventricular tachycardia   Essential hypertension   H/O aortic valve replacement   Acute renal insufficiency   AI (aortic insufficiency)   Urinary tract infection due to Enterococcus   Enterococcal bacteremia   Cardiac arrest   Aortic valve incompetence   H/O noncompliance with medical treatment, presenting hazards to health   DVT of upper extremity (deep vein thrombosis)   NSVT (nonsustained ventricular tachycardia)   Chronic atrial fibrillation   Acute on chronic renal failure   Hematemesis with nausea   Aortic valve vegetation   Dysphagia   Multisystem organ failure   Medically noncompliant   Severe aortic insufficiency   Bleeding gastrointestinal   Hypernatremia   Protein-calorie malnutrition, severe   Feeding difficulties   Coagulopathy   Encounter for palliative care   Acute respiratory failure with hypoxia   PEA (Pulseless electrical activity)   Aortic dissection   Hematuria   Palliative care encounter   Hypervolemia   Acute respiratory failure    Time spent: 35  min    Evalina Tabak  [facility] Pager [phone]. If 7PM-7AM, please contact night-coverage at [email or web address], password [facility] 07/22/2014, 3:50 PM  LOS: 17 days

## 2014-07-22 NOTE — Progress Notes (Signed)
ANTICOAGULATION CONSULT NOTE - Follow Up Consult  Pharmacy Consult for Heparin Indication: DVT, afib, mechanical AV thrombosis  No Known Allergies  Patient Measurements: Height: 5' 9.5" (176.5 cm) Weight: 248 lb 14.4 oz (112.9 kg) IBW/kg (Calculated) : 71.85 Heparin Dosing Weight: 91 kg  Vital Signs: Temp: 97.7 F (36.5 C) (06/26 0500) Temp Source: Oral (06/26 0500) BP: 132/94 mmHg (06/26 0500) Pulse Rate: 82 (06/26 0500)  Labs:  Recent Labs  07/04/2014 0440  07/21/14 0500 07/21/14 0506 07/21/14 1526 07/22/14 0634  HGB 8.6*  --   --  8.3*  --  8.4*  HCT 28.1*  --   --  27.4*  --  28.2*  PLT 258  --   --  261  --  254  LABPROT  --   --  19.1*  --   --   --   INR  --   --  1.60*  --   --   --   HEPARINUNFRC 0.53  --  0.45  --   --  0.46  CREATININE  --   < >  --  4.60* 4.60* 4.32*  < > = values in this interval not displayed.  Estimated Creatinine Clearance: 20.7 mL/min (by C-G formula based on Cr of 4.32).  Assessment: 69 yo male with mechanical AV thrombosis, DVT and afib on heparin.  Heparin was off with cardiac arrest 6/21, resumed 6/22.  Heparin level remains therapeutic at 0.46 on 1200 units/hr. Hgb low but stable, plts WNL. No overt bleeding noted  Goal of Therapy:  Heparin level 0.3-0.5 units/ml Monitor platelets by anticoagulation protocol: Yes   Plan:  - Continue heparin drip at 1200 units/hr - Daily heparin level and CBC - follow for s/s bleeding - follow GOC  Arianis Bowditch D. Ashlynn Gunnels, PharmD, BCPS Clinical Pharmacist Pager: [phone] 07/22/2014 9:58 AM

## 2014-07-22 NOTE — Progress Notes (Signed)
Patient ID: [name], male   DOB: [birth year], 69 y.o.   MRN: [id number]     Subjective:    No complaints  Objective:   Temp:  [97.7 F (36.5 C)-98.4 F (36.9 C)] 97.7 F (36.5 C) (06/26 0500) Pulse Rate:  [36-119] 82 (06/26 0500) Resp:  [20-22] 20 (06/26 0500) BP: (120-185)/(55-106) 132/94 mmHg (06/26 0500) SpO2:  [92 %-100 %] 95 % (06/26 0500) Weight:  [248 lb 14.4 oz (112.9 kg)] 248 lb 14.4 oz (112.9 kg) (06/26 0500) Last BM Date: 07/15/14  Filed Weights   07/10/2014 0400 07/21/14 0400 07/22/14 0500  Weight: 246 lb 0.5 oz (111.6 kg) 223 lb 8.7 oz (101.4 kg) 248 lb 14.4 oz (112.9 kg)    Intake/Output Summary (Last 24 hours) at 07/22/14 0736 Last data filed at 07/22/14 0640  Gross per 24 hour  Intake    965 ml  Output   2175 ml  Net  -1210 ml    Telemetry: No tele  Exam:  General: NAD  Resp: clear anteriorally  Cardiac: irreg, 3/6 systolic murmur RUSB, mechanical S2, elevated JVD  WC:BJSEGBT soft, NT, ND  MSK: trace bilateral edema  Neuro: no focal deficits    Lab Results:  Basic Metabolic Panel:  Recent Labs Lab 07/16/14 0406  07/26/2014 1700  07/21/14 0506 07/21/14 1526 07/22/14 0634  NA  --   < >  --   < > 147* 145 147*  K  --   < >  --   < > 2.9* 3.7 3.2*  CL  --   < >  --   < > 101 103 101  CO2  --   < >  --   < > 36* 34* 35*  GLUCOSE  --   < >  --   < > 104* 122* 100*  BUN  --   < >  --   < > 46* 45* 43*  CREATININE  --   < >  --   < > 4.60* 4.60* 4.32*  CALCIUM  --   < >  --   < > 8.3* 8.5* 8.8*  MG 2.2  --  2.5*  --  2.2  --   --   < > = values in this interval not displayed.  Liver Function Tests:  Recent Labs Lab 07/15/2014 0730 06/27/2014 1052 07/18/14 0500  AST 39 35 33  ALT 26 25 21   ALKPHOS 45 44 39  BILITOT 1.0 1.1 1.2  PROT 7.2 7.7 5.9*  ALBUMIN 3.0* 3.0* 2.3*    CBC:  Recent Labs Lab 07/20/14 0440 07/21/14 0506 07/22/14 0634  WBC 11.9* 10.3 10.7*  HGB 8.6* 8.3* 8.4*  HCT 28.1* 27.4* 28.2*  MCV 87.5 89.0  90.1  PLT 258 261 254    Cardiac Enzymes:  Recent Labs Lab 07/08/2014 1700  TROPONINI 0.07*    BNP: No results for input(s): PROBNP in the last 8760 hours.  Coagulation:  Recent Labs Lab 06/28/2014 1357 07/18/14 0500 07/21/14 0500  INR 1.99* 2.05* 1.60*    ECG:   Medications:   Scheduled Medications: . antiseptic oral rinse  7 mL Mouth Rinse QID  . chlorhexidine  15 mL Mouth/Throat BID  . furosemide  80 mg Intravenous BID  . hydrALAZINE  25 mg Oral 3 times per day  . pantoprazole (PROTONIX) IV  40 mg Intravenous Q12H  . piperacillin-tazobactam (ZOSYN)  IV  2.25 g Intravenous 4 times per day     Infusions: .  sodium chloride 10 mL/hr at 07/21/14 0800  . dextrose 5 % and 0.45% NaCl 30 mL/hr at 07/21/14 0800  . heparin 1,200 Units/hr (07/21/14 1500)  . morphine 1 mg/hr (07/22/14 0647)     PRN Medications:  albuterol, LORazepam, metoprolol, morphine injection, [DISCONTINUED] ondansetron **OR** ondansetron (ZOFRAN) IV, sodium chloride     Assessment/Plan    69 y/o M initially admitted to [facility] on 5/19 with SOB in the setting of AV insufficiency after developing a partially thrombosed mechanical AoV after stopping coumadin for unclear reasons. He was scheduled for an AVR on 5/26 but developed recurrent melena. He underwent an EGD with clipping of gastric erosion on 5/26. Course was complicated by DVT of the LUE, anemia, dental caries and deconditioning of critical illness. He was transferred to [place] on 6/3 for planned rehab prior to AVR. 6/7 the patient was taken for multiple tooth extraction. 6/10 afternoon, he suffered a VT arrest requiring ~ 2 min of CPR. He was intubated post CPR. Developed enterococcal bacteremia 6/10 & AMS for several days, then developed brady/PEA/resp arrest 6/21.   1. Mechanical AVR Partial Thrombosis - multiple complications have prohibited surgery, currently poor candidate - continue heparin gtt. Start ASA 81mg  daily.  -  hemodynamically stable  2. Bradycardia/PEA arrest - he is off all AV nodal agents currently.  3. Afib - avoiding AV nodal agents given prior brady/PEA arrest, rates overall acceptable - on heparin gtt for stroke prevention  4. Acute on chronic diastolic heart failrue - negative 1.2 liters yesterday, positive 9.3 liters since admission. Currently on lasix 80mg  IV bid, Cr trending down. Continue current dose of diuretics, plan would be for gradual diuresis given his renal function and partially thrombosed AV and relative stenosis.     5. Hypokalemia - replacement per primary team  6. Enterococcal bacteremia - abx per primary team   7. DNR - overall poor prognosis, palliative care involvement is noted and appreciated. Low dose morphine started for comfort, have not elected to deescalate care at this time.   8. HTN - labile, elevated at times. WIll continue to follow trend, consider addition HTN meds over the next few days.   Carlyle Dolly, [name]

## 2014-07-23 LAB — CBC
HCT: 26 % — ABNORMAL LOW (ref 39.0–52.0)
Hemoglobin: 7.7 g/dL — ABNORMAL LOW (ref 13.0–17.0)
MCH: 26.8 pg (ref 26.0–34.0)
MCHC: 29.6 g/dL — ABNORMAL LOW (ref 30.0–36.0)
MCV: 90.6 fL (ref 78.0–100.0)
PLATELETS: 260 10*3/uL (ref 150–400)
RBC: 2.87 MIL/uL — AB (ref 4.22–5.81)
RDW: 19.8 % — ABNORMAL HIGH (ref 11.5–15.5)
WBC: 10 10*3/uL (ref 4.0–10.5)

## 2014-07-23 LAB — BASIC METABOLIC PANEL
Anion gap: 6 (ref 5–15)
BUN: 38 mg/dL — AB (ref 6–20)
CO2: 36 mmol/L — ABNORMAL HIGH (ref 22–32)
Calcium: 8.8 mg/dL — ABNORMAL LOW (ref 8.9–10.3)
Chloride: 103 mmol/L (ref 101–111)
Creatinine, Ser: 4.13 mg/dL — ABNORMAL HIGH (ref 0.61–1.24)
GFR calc Af Amer: 16 mL/min — ABNORMAL LOW (ref >60)
GFR, EST NON AFRICAN AMERICAN: 14 mL/min — AB (ref >60)
Glucose, Bld: 97 mg/dL (ref 65–99)
Potassium: 3.7 mmol/L (ref 3.5–5.1)
Sodium: 145 mmol/L (ref 135–145)

## 2014-07-23 LAB — HEPARIN LEVEL (UNFRACTIONATED): Heparin Unfractionated: 0.39 IU/mL (ref 0.30–0.70)

## 2014-07-23 MED ORDER — MORPHINE SULFATE 2 MG/ML IJ SOLN: 2.0000 mg | INTRAMUSCULAR | Status: DC | PRN

## 2014-07-24 SURGERY — BENTALL PROCEDURE
Anesthesia: General | Site: Chest

## 2014-07-27 NOTE — Care Management (Signed)
Important Message  Patient Details  Name: [name] MRN: [id number] Date of Birth: 1945-12-15   Medicare Important Message Given:  Yes-fourth notification given    Delorse Lek Aug 04, 2014, 1:10 PM

## 2014-07-27 NOTE — Discharge Summary (Addendum)
Death Summary  [name] JOI:[id number] DOB: 01-03-1946 DOA: 06/28/2014  PCP: [name], MD PCP/Office notified:   Admit date: Jul 20, 2014 Date of Death: 08-07-14  Final Diagnoses:  Principal Problem:   Positive blood cultures Active Problems:   Aortic valve insufficiency   Hx of aortic aneurysm repair   Atrial fibrillation, do not know length of time   Gastric erosion   Acute respiratory failure, unspecified whether with hypoxia or hypercapnia   Anemia   Ventricular tachycardia   Essential hypertension   H/O aortic valve replacement   Acute renal insufficiency   AI (aortic insufficiency)   Urinary tract infection due to Enterococcus   Enterococcal bacteremia   Cardiac arrest   Aortic valve incompetence   H/O noncompliance with medical treatment, presenting hazards to health   DVT of upper extremity (deep vein thrombosis)   NSVT (nonsustained ventricular tachycardia)   Chronic atrial fibrillation   Acute on chronic renal failure   Hematemesis with nausea   Aortic valve vegetation   Dysphagia   Multisystem organ failure   Medically noncompliant   Severe aortic insufficiency   Bleeding gastrointestinal   Hypernatremia   Protein-calorie malnutrition, severe   Feeding difficulties   Coagulopathy   Encounter for palliative care   Acute respiratory failure with hypoxia   PEA (Pulseless electrical activity)   Aortic dissection   Hematuria   Palliative care encounter   Hypervolemia   Acute respiratory failure    History of present illness:  69 y/o M initially admitted to [facility] on 5/19 with SOB in the setting of AV insufficiency after developing a partially thrombosed mechanical AV after stopping coumadin for unclear reasons. He was scheduled for an AVR on 5/26 but developed recurrent melena. He underwent an EGD with clipping of gastric erosion on 5/26. Course was complicated by DVT of the LUE, anemia, dental caries and deconditioning of critical illness. He was  transferred to [place] on 6/3 for planned rehab prior to AVR. 6/7 the patient was taken for multiple tooth extraction. 6/10 afternoon, he suffered a VT arrest requiring ~ 2 min of CPR. He was intubated post CPR and PCCM consulted for evaluation.  Developed enterococcal bacteremia 6/10 & AMS for severeal days, & developed brady/PEA/resp arrest 6/21,   Hospital Course:  Patient is a 70 year old gentleman with a history of aortic valve insufficiency status post urinary valve replacement with [employer] mechanical valve, hospitalized between 06/13/2014 and 06/29/2014 presenting with cardiogenic shock and malfunctioning valve related to thrombosis. Unfortunately he had stopped Coumadin for unclear reasons. It appears that he is scheduled for aortic valve replacement on 06/21/2014 however developed GI bleed. Patient was seen by GI undergoing EGD with clipping of gastric erosion on 06/21/2014. He was discharged to inpatient rehabilitation in stable condition. On July 20, 2014 he underwent dental procedure and multiple extractions in preparation for aortic valve replacements in future. On 07/06/2014 he went into ventricular tachycardia, arrested, CPR administered for 2 minutes. He underwent rapid sequence intubation. He was readmitted to the intensive care unit. Hospitalization complicated by development of enterococcal bacteremia, acute encephalopathy, went into PEA arrest on 07/02/2014. Patient now found to be candidate for revision based on arrest, clinical status, GI bleeds. He was terminally extubated on 07/22/2014. Transfer to the floor on 07/21/2014 under the care of medicine. Palliative care following. Patient expired on 08/07/14. I spoke to family members at bedside HCAP and Aspiration PNA are probable contributors to respiratory failure.  HTN likely contributed to development chronic diastolic CHF.  SIGNIFICANT EVENTS: 5/19-6/03 Admit to [facility] for cardiogenic shock secondary to AV insufficiency, GI  bleeding, enteroccocus bacteremia 5/20 Cardiac Cath >> non-obstructive disease, 10% stenosis in LAD & L circumflex  6/03-6/09 Admit to [facility] CIR for rehab. 6/9 had multiple tooth extraction and was transferred back to [facility]  6/09 Admit to [facility] after dental extraction 6/10 VT arrest, 2 min CPR, intubated & tx to ICU  6/11 Push weaning 6/20 palliative medicine consult, TCTS says not a good surgery candidate 6/21 could not place PEG due to high INR, PEA arrest 6/24: terminally extubated. Full DNR    Time: 15 min  Signed:  Kelvin Cellar  [facility] 08-02-2014, 12:58 PM

## 2014-07-27 NOTE — Progress Notes (Signed)
Patient ID: [name], male   DOB: 1945-08-05, 69 y.o.   MRN: [id number]     Subjective:    No complaints. Nose bleed.   Objective:   Temp:  [98 F (36.7 C)-98.5 F (36.9 C)] 98 F (36.7 C) (06/27 1749) Pulse Rate:  [70-104] 70 (06/27 0642) Resp:  [20-22] 20 (06/27 0642) BP: (122-145)/(72-102) 122/72 mmHg (06/27 0642) SpO2:  [91 %-98 %] 91 % (06/27 0642) Weight:  [246 lb 7.6 oz (111.8 kg)] 246 lb 7.6 oz (111.8 kg) (06/27 0642) Last BM Date: 07/15/14  Filed Weights   07/21/14 0400 07/22/14 0500 2014/08/21 0642  Weight: 223 lb 8.7 oz (101.4 kg) 248 lb 14.4 oz (112.9 kg) 246 lb 7.6 oz (111.8 kg)    Intake/Output Summary (Last 24 hours) at 08/21/2014 0839 Last data filed at 21-Aug-2014 4496  Gross per 24 hour  Intake    360 ml  Output   3650 ml  Net  -3290 ml    Telemetry: No tele  Exam:  General: NAD, nose bleed noted  Resp: clear anteriorally  Cardiac: irreg, 3/6 systolic murmur RUSB, mechanical S2, elevated JVD  PR:FFMBWGY soft, NT, ND  MSK: trace bilateral edema  Neuro: no focal deficits    Lab Results:  Basic Metabolic Panel:  Recent Labs Lab 07/09/2014 1700  07/21/14 0506 07/21/14 1526 07/22/14 0634 2014-08-21 0629  NA  --   < > 147* 145 147* 145  K  --   < > 2.9* 3.7 3.2* 3.7  CL  --   < > 101 103 101 103  CO2  --   < > 36* 34* 35* 36*  GLUCOSE  --   < > 104* 122* 100* 97  BUN  --   < > 46* 45* 43* 38*  CREATININE  --   < > 4.60* 4.60* 4.32* 4.13*  CALCIUM  --   < > 8.3* 8.5* 8.8* 8.8*  MG 2.5*  --  2.2  --   --   --   < > = values in this interval not displayed.  Liver Function Tests:  Recent Labs Lab 07/03/2014 0730 07/22/2014 1052 07/18/14 0500  AST 39 35 33  ALT 26 25 21   ALKPHOS 45 44 39  BILITOT 1.0 1.1 1.2  PROT 7.2 7.7 5.9*  ALBUMIN 3.0* 3.0* 2.3*    CBC:  Recent Labs Lab 07/21/14 0506 07/22/14 0634 08/21/14 0629  WBC 10.3 10.7* 10.0  HGB 8.3* 8.4* 7.7*  HCT 27.4* 28.2* 26.0*  MCV 89.0 90.1 90.6  PLT 261 254 260     Cardiac Enzymes:  Recent Labs Lab 07/16/2014 1700  TROPONINI 0.07*    BNP: No results for input(s): PROBNP in the last 8760 hours.  Coagulation:  Recent Labs Lab 07/20/2014 1357 07/18/14 0500 07/21/14 0500  INR 1.99* 2.05* 1.60*    ECG:   Medications:   Scheduled Medications: . antiseptic oral rinse  7 mL Mouth Rinse QID  . aspirin EC  81 mg Oral Daily  . chlorhexidine  15 mL Mouth/Throat BID  . furosemide  80 mg Intravenous BID  . hydrALAZINE  25 mg Oral 3 times per day  . pantoprazole (PROTONIX) IV  40 mg Intravenous Q12H  . piperacillin-tazobactam (ZOSYN)  IV  2.25 g Intravenous 4 times per day    Infusions: . sodium chloride 10 mL/hr at 07/21/14 0800  . dextrose 5 % and 0.45% NaCl 30 mL/hr at 07/21/14 0800  . heparin 1,200 Units/hr (07/22/14 1353)  .  morphine 1 mg/hr (07/22/14 0647)    PRN Medications: albuterol, LORazepam, metoprolol, morphine injection, [DISCONTINUED] ondansetron **OR** ondansetron (ZOFRAN) IV, sodium chloride     Assessment/Plan    69 y/o M initially admitted to [facility] on 5/19 with SOB in the setting of AV insufficiency after developing a partially thrombosed mechanical AoV after stopping coumadin for unclear reasons. He was scheduled for an AVR on 5/26 but developed recurrent melena. He underwent an EGD with clipping of gastric erosion on 5/26. Course was complicated by DVT of the LUE, anemia, dental caries and deconditioning of critical illness. He was transferred to [place] on 6/3 for planned rehab prior to AVR. 6/7 the patient was taken for multiple tooth extraction. 6/10 afternoon, he suffered a VT arrest requiring ~ 2 min of CPR. He was intubated post CPR. Developed enterococcal bacteremia 6/10 & AMS for several days, then developed brady/PEA/resp arrest 6/21.   1. Mechanical AVR Partial Thrombosis - multiple complications have prohibited surgery, currently poor candidate - continue heparin gtt. Start ASA 81mg  daily.  -  hemodynamically stable - nose bleed noted. Dry nasal passages likely. Humidification started by nursing.   2. Bradycardia/PEA arrest - he is off all AV nodal agents currently.  3. Afib - avoiding AV nodal agents given prior brady/PEA arrest, rates overall acceptable - on heparin gtt for stroke prevention  4. Acute on chronic diastolic heart failrue - negative 3.2 liters yesterday, positive 6 liters since admission. Currently on lasix 80mg  IV bid, Cr trending down. Continue current dose of diuretics, plan would be for gradual diuresis given his renal function and partially thrombosed AV and relative stenosis.     5. Hypokalemia - replacement per primary team  6. Enterococcal bacteremia - abx per primary team   7. DNR - overall poor prognosis, palliative care involvement is noted and appreciated. Low dose morphine started for comfort, have not elected to deescalate care at this time.   8. HTN - labile, elevated at times. WIll continue to follow trend, consider addition HTN meds over the next few days.   Candee Furbish, MD

## 2014-07-27 NOTE — Progress Notes (Signed)
Nutrition Brief Note  Chart reviewed. Pt now transitioning to comfort care.  No further nutrition interventions warranted at this time.  Please re-consult as needed.   Kyoko Elsea A. Kale Dols, RD, LDN, CDE Pager: [phone] After hours Pager: [phone]  

## 2014-07-27 NOTE — Clinical Social Work Note (Signed)
CSW received consult for SNF vs hospice placement.  Patient expired today, CSW to sign off.  Jones Broom. Williams, MSW, [place] 2014-08-18 1:37 PM

## 2014-07-27 NOTE — Care Management Note (Signed)
Case Management Note  Patient Details  Name: [name] MRN: [id number] Date of Birth: 03/02/1945  Subjective/Objective:                    Action/Plan: UR updated   Expected Discharge Date:       08-22-14            Expected Discharge Plan:  Expired  In-House Referral:  Clinical Social Work  Discharge planning Services  CM Consult  Post Acute Care Choice:    Choice offered to:     DME Arranged:    DME Agency:     HH Arranged:    [place] Agency:     Status of Service:  Completed, signed off  Medicare Important Message Given:  Yes-fourth notification given Date Medicare IM Given:  07/13/14 Medicare IM give by:  Jonnie Finner RN CCM  Date Additional Medicare IM Given:  07/16/14 Additional Medicare Important Message give by:  debbie dowell rn,bsn  If discussed at Long Length of Stay Meetings, dates discussed:    Additional Comments:  Marilu Favre, RN 22-Aug-2014, 2:05 PM

## 2014-07-27 NOTE — Progress Notes (Signed)
ANTICOAGULATION CONSULT NOTE - Follow Up Consult  Pharmacy Consult for Heparin / Zosyn Indication: DVT, afib, mechanical AV thrombosis / possible HCAP  No Known Allergies  Patient Measurements: Height: 5' 9.5" (176.5 cm) Weight: 246 lb 7.6 oz (111.8 kg) IBW/kg (Calculated) : 71.85 Heparin Dosing Weight: 91 kg  Vital Signs: Temp: 98 F (36.7 C) (06/27 0642) Temp Source: Oral (06/27 0642) BP: 122/72 mmHg (06/27 0642) Pulse Rate: 70 (06/27 0642)  Labs:  Recent Labs  07/21/14 0500  07/21/14 0506 07/21/14 1526 07/22/14 0634 Aug 08, 2014 0629 08-08-14 0630  HGB  --   < > 8.3*  --  8.4* 7.7*  --   HCT  --   --  27.4*  --  28.2* 26.0*  --   PLT  --   --  261  --  254 260  --   LABPROT 19.1*  --   --   --   --   --   --   INR 1.60*  --   --   --   --   --   --   HEPARINUNFRC 0.45  --   --   --  0.46  --  0.39  CREATININE  --   < > 4.60* 4.60* 4.32* 4.13*  --   < > = values in this interval not displayed.  Estimated Creatinine Clearance: 21.6 mL/min (by C-G formula based on Cr of 4.13).  Assessment: 69 yo male with mechanical AV thrombosis, DVT and afib on heparin.  Heparin was off with cardiac arrest 2022/08/02, resumed 6/22.  Heparin level remains therapeutic at 0.39 on 1200 units/hr. Hgb low but stable, plts WNL. No overt bleeding noted. Nose bleed noted - likely dry nasal passages per MD. To continue heparin.  Patient continuing on Zosyn D#7 for possible HCAP. Patient was originally on 2 weeks of ampicillin for enterococcal bacteremia - coded on 2022-08-02 and was switched to ampicillin post-code. Afebrile, wbc normalized. SCr peaked at 4.76, 4.13 today, CrCl~21, UOP 1.4  Goal of Therapy:  Heparin level 0.3-0.5 units/ml Monitor platelets by anticoagulation protocol: Yes   Plan:  -Continue heparin at 1200 units/hr  -Daily HL and CBC  -Mon s/sx bleeding  -zosyn 2.25 gm IV q6hrs  -Mon CrCl improvement - may need to increase dose if abx continues -Mon clinical progress, abx  plan -pall involved- not escalating care, but haven't withdrawn yet   Elicia Lamp, PharmD Clinical Pharmacist - Resident Pager [phone] 2014-08-08 11:52 AM

## 2014-07-27 DEATH — deceased

## 2014-08-17 NOTE — Progress Notes (Signed)
REVIEWED. PT DECEASED JUN 2016.

## 2014-08-27 NOTE — Progress Notes (Signed)
PT IS DECEASED

## 2016-03-18 IMAGING — DX DG KNEE 1-2V*L*
2 series · 2 of 2 positions shown · non-contrast
Comparison: None.

CLINICAL DATA: 69-year-old male with generalized left knee pain and
swelling progressive the past few days.

EXAM:
LEFT KNEE - 1-2 VIEW

[knee ap]
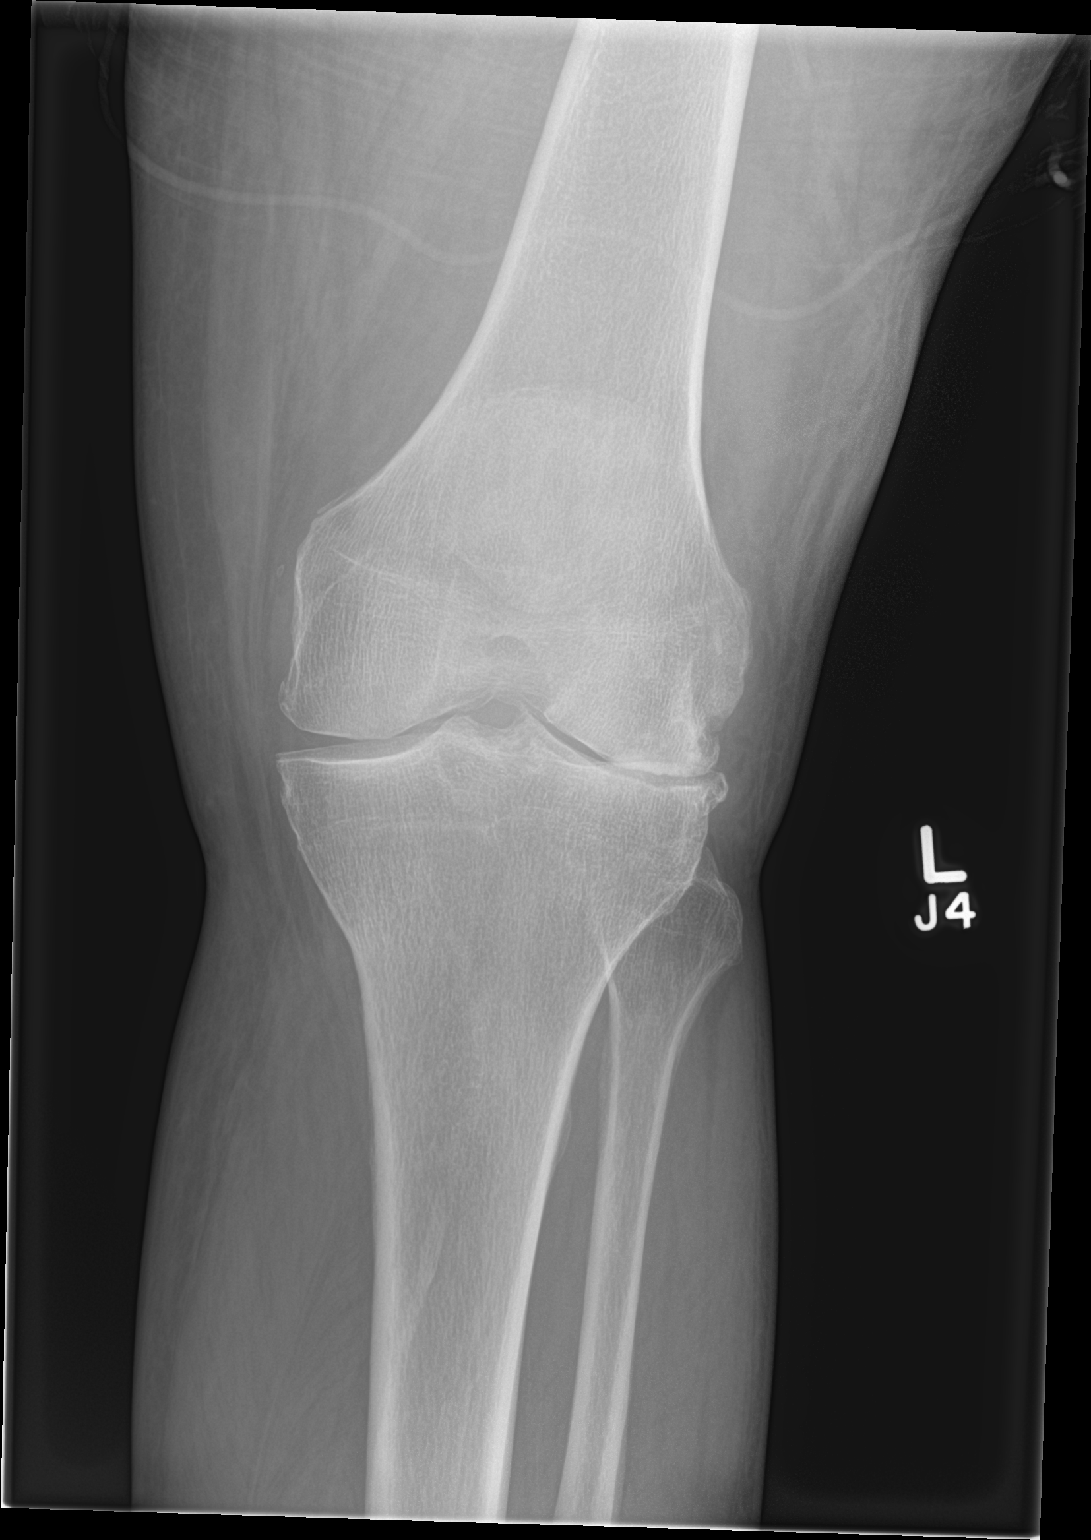

[knee lat]
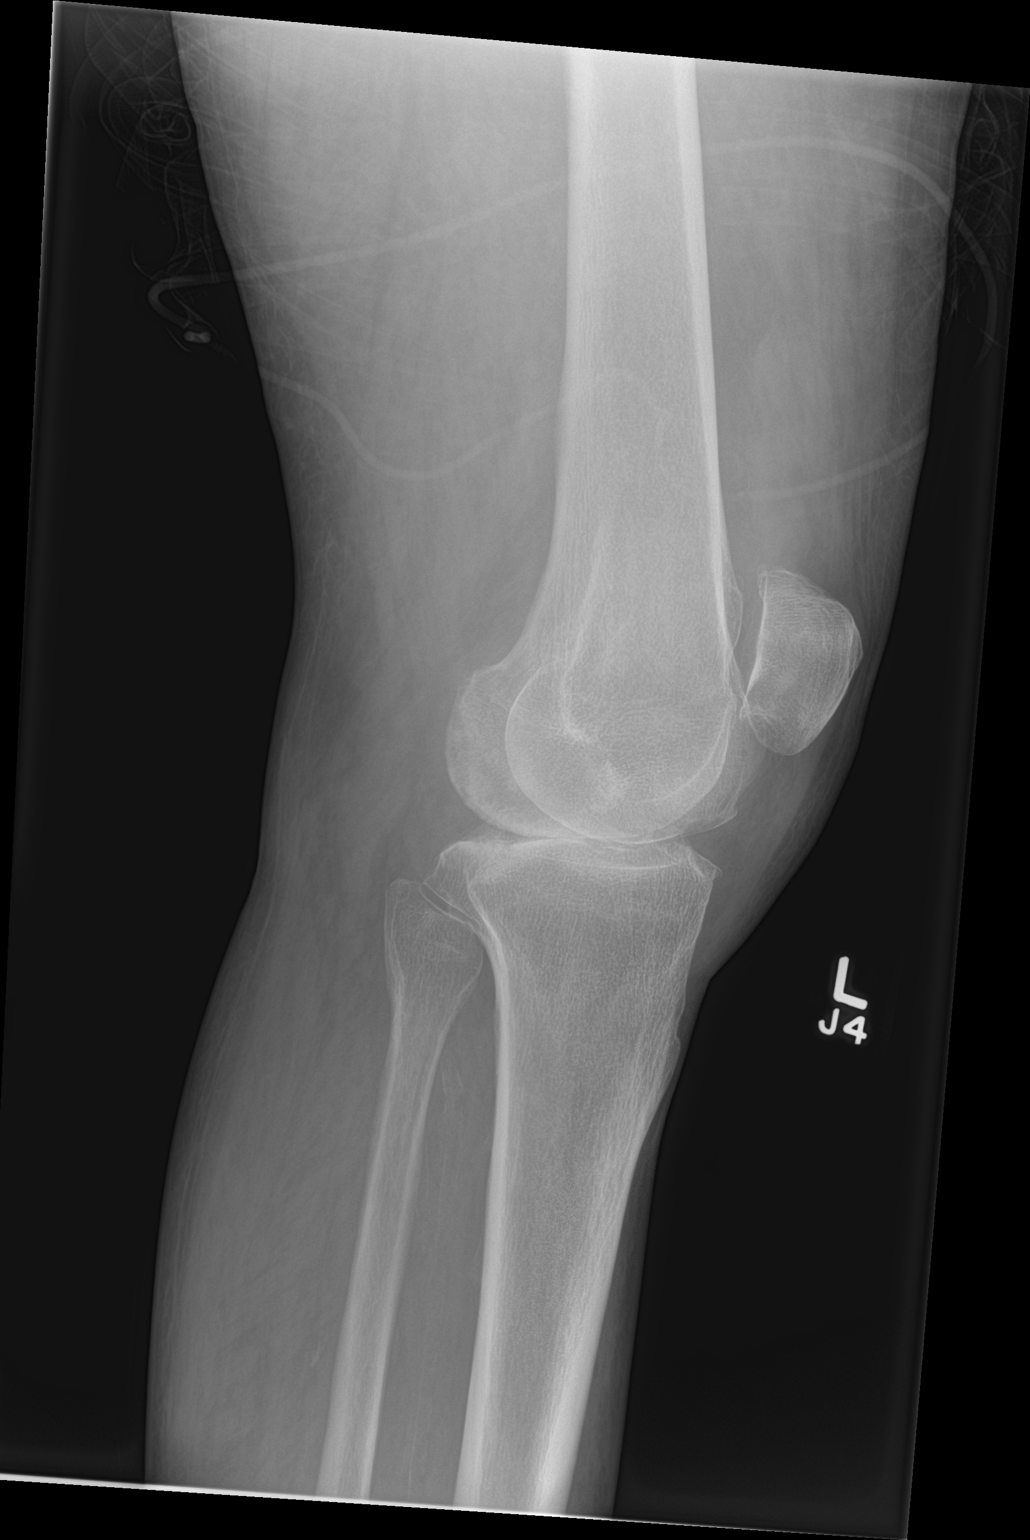

[2 of 2 positions shown; findings below may reference images not displayed]

FINDINGS: Suspect small suprapatellar joint effusion. No evidence acute
fracture or malalignment. There is narrowing of the lateral
compartmental joint space with subchondral sclerosis and subtle
irregularity of the cortical surface of the lateral femoral condyle
and lateral tibial plateau. Mild osteophyte formation in the
patellofemoral compartment. No evidence of acute fracture or
malalignment. No focal soft tissue abnormality.
IMPRESSION: 1. Small suprapatellar knee joint effusion which may be
degenerative, or infectious.
2. Degenerative changes relatively focal to the lateral compartments
with significant narrowing of the joint space and subtle
irregularity of the cortical margin of the lateral femoral condyle
and lateral tibial plateau. This is favored to represent advanced
degenerative change with chondromalacia and secondary bony changes.

## 2016-03-28 IMAGING — CR DG ABD PORTABLE 1V
1 series · 1 of 1 positions shown · non-contrast
Comparison: 07/12/2014

ADDENDUM:
CORRECTED IMPRESSION:

Normal NON obstructive bowel gas pattern. Interval removal of
enteric
tube.
CLINICAL DATA: Abdominal pain tonight.
EXAM:
PORTABLE ABDOMEN - 1 VIEW

[AP]
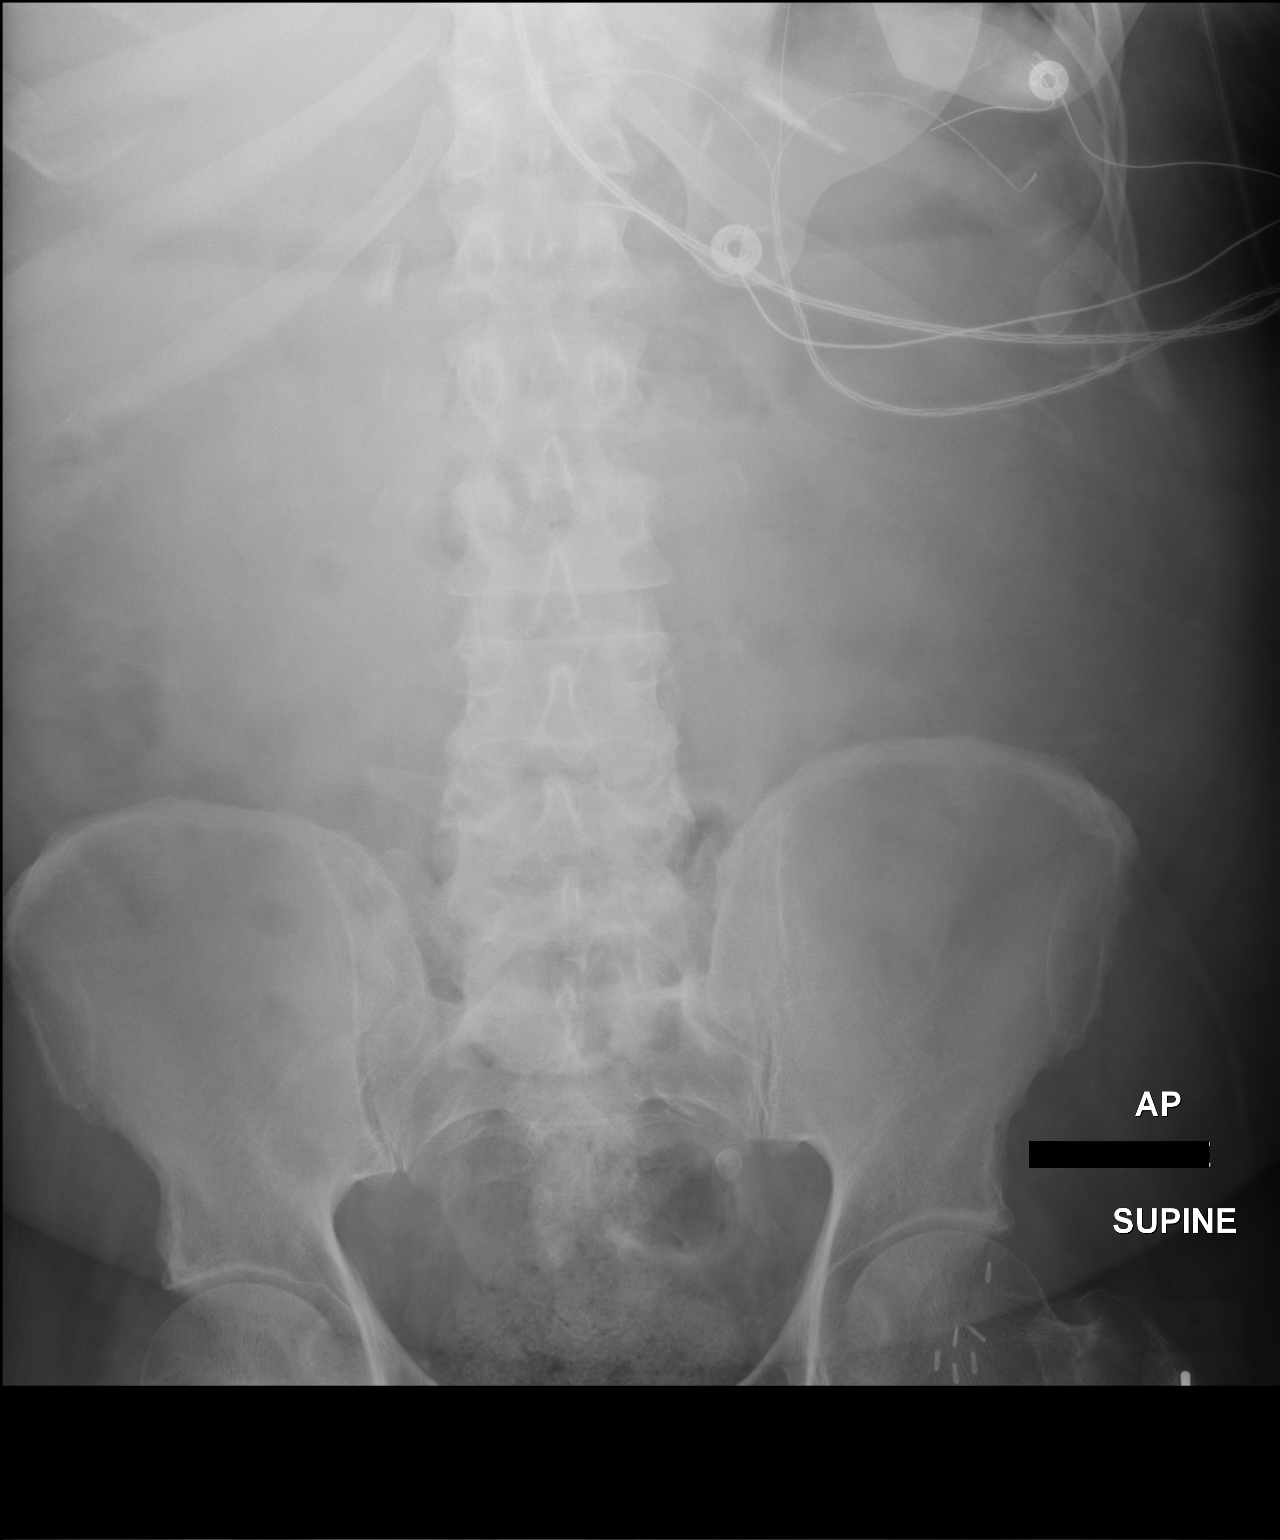

[1 of 1 positions shown; findings below may reference images not displayed]

FINDINGS: The enteric tube appears to been removed since the previous study.
Visualized bowel gas pattern is normal. Stool in the colon. No small
or large bowel distention. No radiopaque stones. Surgical clips in
the abdomen and left pelvis. Vascular calcifications.
IMPRESSION: Normal on obstructive bowel gas pattern. Interval removal of enteric
tube.

## 2016-04-01 IMAGING — CR DG CHEST 1V PORT
1 series · 1 of 1 positions shown · non-contrast
Comparison: 07/17/2014 at 9540 hours.

CLINICAL DATA: Central line placement.  Evaluate tip position.

EXAM:
PORTABLE CHEST - 1 VIEW

[ap portable]
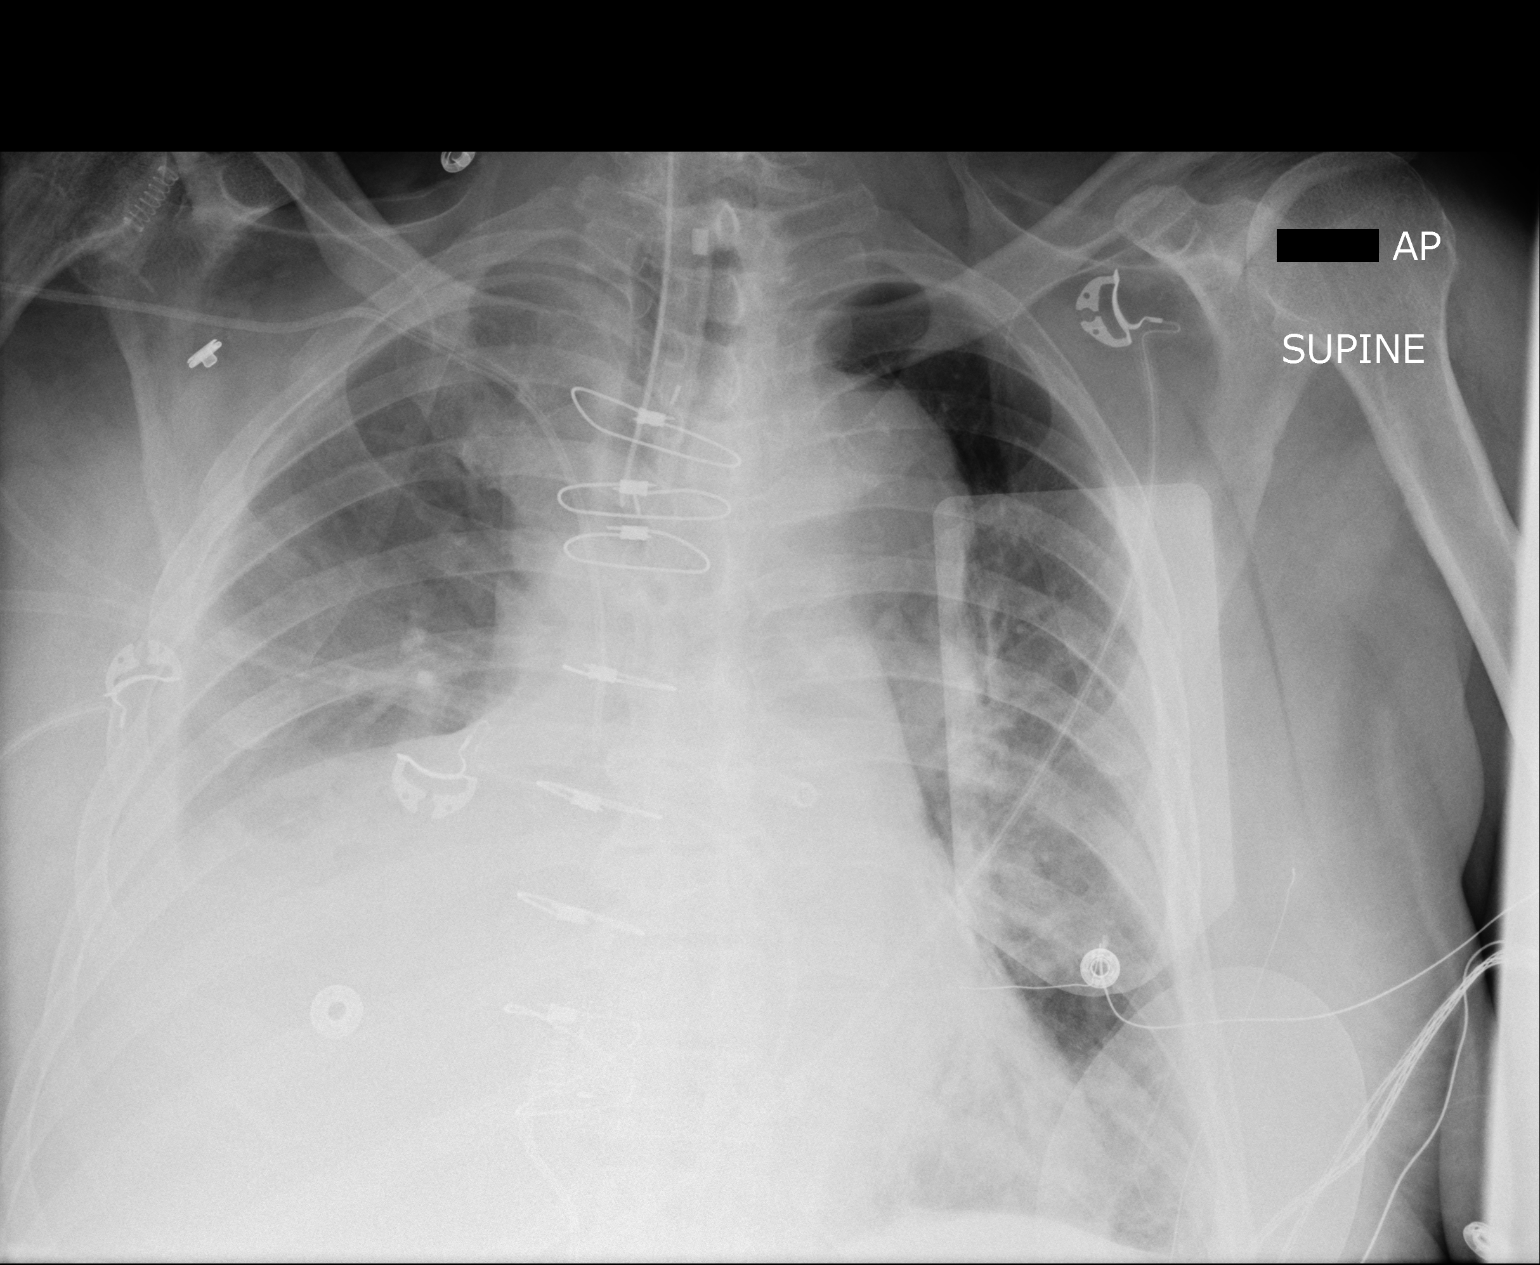

[1 of 1 positions shown; findings below may reference images not displayed]

FINDINGS: Support apparatus: Unchanged endotracheal tube. New RIGHT subclavian
central line is present with the tip in the lower SVC about 1
vertebral body inferior to the carina. Defibrillator pads overlie
the chest. Monitoring leads project over the chest.

Cardiomediastinal Silhouette: Unchanged with the RIGHT heart border
obscured. Tortuous enlarged thoracic aorta.

Lungs: Relaxation/compressive atelectasis of the RIGHT lung.
Retrocardiac density on the LEFT, likely representing atelectasis.
No pneumothorax.

Effusions:  Unchanged large RIGHT pleural effusion.

Other:  None.
IMPRESSION: 1. Uncomplicated RIGHT subclavian central line placement.
2. Unchanged RIGHT pleural effusion.
3. Relaxation atelectasis associated with RIGHT effusion.

## 2016-04-04 IMAGING — CR DG CHEST 1V PORT
1 series · 1 of 1 positions shown · non-contrast
Comparison: 07/17/2014.

EXAM:
PORTABLE CHEST - 1 VIEW

[AP]
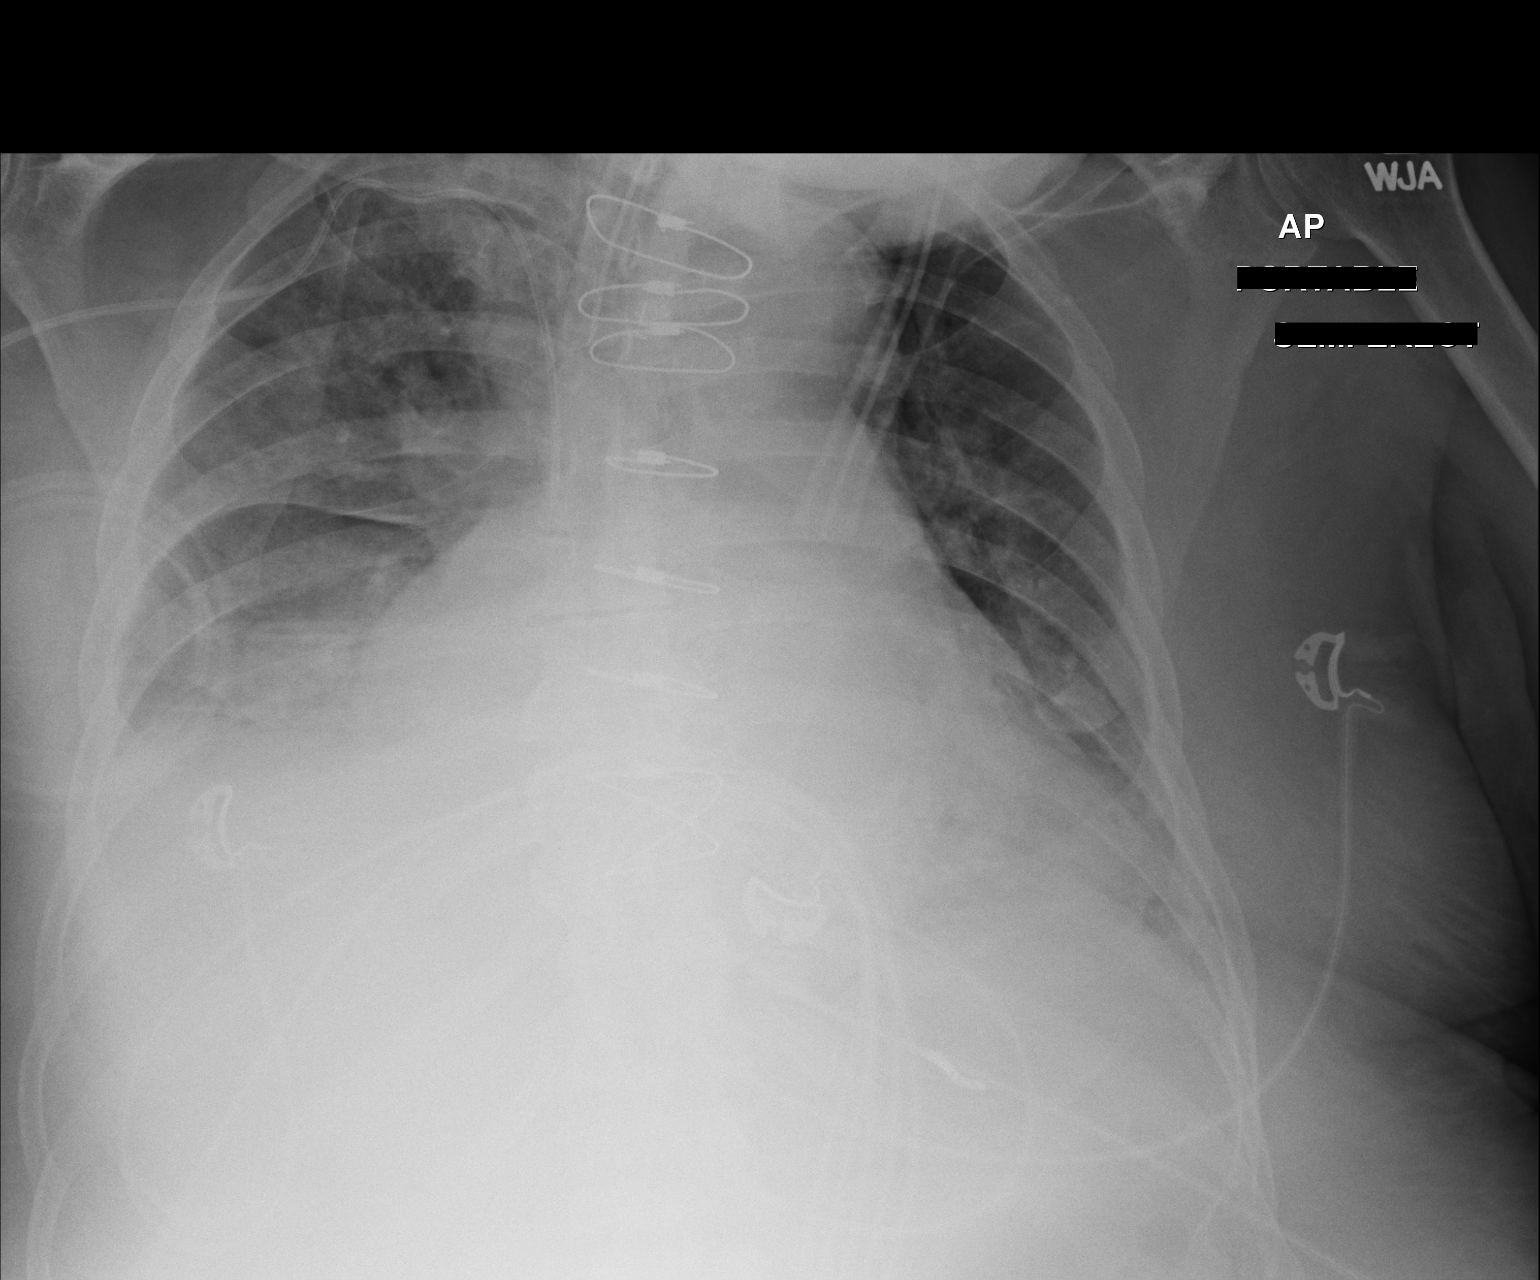

[1 of 1 positions shown; findings below may reference images not displayed]

FINDINGS: Support apparatus: Unchanged consisting of endotracheal tube, RIGHT
subclavian central line and monitoring leads.

Cardiomediastinal Silhouette:  Unchanged.

Lungs: RIGHT basilar atelectasis and RIGHT-greater-than-LEFT
perihilar and basilar predominant airspace disease. No pneumothorax.

Effusions:  Unchanged RIGHT pleural effusion.

Other:  None.
IMPRESSION: 1. Stable support apparatus.
2. Unchanged RIGHT-greater-than-LEFT bilateral airspace disease and
RIGHT pleural effusion.
